# Patient Record
Sex: Female | Born: 1990
Health system: Southern US, Community
[De-identification: ages and names within clinical notes are randomized; demographics above are authoritative.]

## PROBLEM LIST (undated history)

## (undated) ENCOUNTER — Inpatient Hospital Stay (HOSPITAL_COMMUNITY): Payer: Self-pay

## (undated) DIAGNOSIS — Z5189 Encounter for other specified aftercare: Secondary | ICD-10-CM

## (undated) DIAGNOSIS — O139 Gestational [pregnancy-induced] hypertension without significant proteinuria, unspecified trimester: Secondary | ICD-10-CM

## (undated) DIAGNOSIS — M1990 Unspecified osteoarthritis, unspecified site: Secondary | ICD-10-CM

## (undated) DIAGNOSIS — F419 Anxiety disorder, unspecified: Secondary | ICD-10-CM

## (undated) DIAGNOSIS — B009 Herpesviral infection, unspecified: Secondary | ICD-10-CM

## (undated) DIAGNOSIS — Z765 Malingerer [conscious simulation]: Secondary | ICD-10-CM

## (undated) DIAGNOSIS — D649 Anemia, unspecified: Secondary | ICD-10-CM

## (undated) DIAGNOSIS — F1910 Other psychoactive substance abuse, uncomplicated: Secondary | ICD-10-CM

## (undated) DIAGNOSIS — O24419 Gestational diabetes mellitus in pregnancy, unspecified control: Secondary | ICD-10-CM

## (undated) DIAGNOSIS — F329 Major depressive disorder, single episode, unspecified: Secondary | ICD-10-CM

## (undated) DIAGNOSIS — K5090 Crohn's disease, unspecified, without complications: Secondary | ICD-10-CM

## (undated) DIAGNOSIS — F32A Depression, unspecified: Secondary | ICD-10-CM

## (undated) DIAGNOSIS — F1911 Other psychoactive substance abuse, in remission: Secondary | ICD-10-CM

## (undated) HISTORY — DX: Anxiety disorder, unspecified: F41.9

## (undated) HISTORY — DX: Depression, unspecified: F32.A

## (undated) HISTORY — DX: Crohn's disease, unspecified, without complications: K50.90

## (undated) HISTORY — DX: Major depressive disorder, single episode, unspecified: F32.9

## (undated) HISTORY — DX: Anemia, unspecified: D64.9

## (undated) HISTORY — DX: Unspecified osteoarthritis, unspecified site: M19.90

## (undated) HISTORY — PX: CHOLECYSTECTOMY: SHX55

## (undated) HISTORY — DX: Gestational (pregnancy-induced) hypertension without significant proteinuria, unspecified trimester: O13.9

## (undated) HISTORY — DX: Other psychoactive substance abuse, uncomplicated: F19.10

## (undated) HISTORY — DX: Encounter for other specified aftercare: Z51.89

---
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
## 2012-02-17 ENCOUNTER — Emergency Department (HOSPITAL_COMMUNITY)
Admission: EM | Admit: 2012-02-17 | Discharge: 2012-02-17 | Disposition: A | Discharge status: Home / Self Care | Payer: Medicaid Other | Attending: Emergency Medicine | Admitting: Emergency Medicine

## 2012-02-17 ENCOUNTER — Encounter (HOSPITAL_COMMUNITY): Payer: Self-pay | Admitting: Emergency Medicine

## 2012-02-17 DIAGNOSIS — T7840XA Allergy, unspecified, initial encounter: Secondary | ICD-10-CM | POA: Insufficient documentation

## 2012-02-17 DIAGNOSIS — T50995A Adverse effect of other drugs, medicaments and biological substances, initial encounter: Secondary | ICD-10-CM | POA: Insufficient documentation

## 2012-02-17 MED ORDER — PREDNISONE 20 MG PO TABS: 40.0000 mg | ORAL_TABLET | Freq: Once | ORAL | Status: AC

## 2012-02-17 MED ORDER — PREDNISONE 20 MG PO TABS
60.0000 mg | ORAL_TABLET | Freq: Once | ORAL | Status: AC
  Administered 2012-02-17: 60 mg via ORAL
  Filled 2012-02-17: qty 3

## 2012-02-17 NOTE — Discharge Instructions (Signed)

## 2012-02-17 NOTE — ED Notes (Signed)
Pt alert, nad, c/o rash to left arm, onset a week, per family believed to be a reaction to Vicodin, changed detergents recently, rash worse, resp even unlabored, skin pwd, pt has 5cm raised area to left antecubital area

## 2012-02-17 NOTE — ED Provider Notes (Signed)
History     CSN: [id number]  Arrival date & time 02/17/12  2204   First MD Initiated Contact with Patient 02/17/12 2325      Chief Complaint  Patient presents with  . Rash    (Consider location/radiation/quality/duration/timing/severity/associated sxs/prior treatment) HPI Comments: Patient here with rash to left antecubital fossa - she states that she was recently treated with flagyl for trichamoniasis and has also started using a new detergent.  She states the itching started the following day - denies any other area where the rash and itching are.  Patient is a 21 y.o. female presenting with rash. The history is provided by the patient. No language interpreter was used.  Rash  This is a new problem. The current episode started more than 2 days ago. The problem has not changed since onset.The problem is associated with new medications and a new detergent/soap. There has been no fever. Affected Location: left antecubital fossa. The pain is at a severity of 0/10. The patient is experiencing no pain. The pain has been constant since onset. Associated symptoms include itching. Pertinent negatives include no blisters, no pain and no weeping. She has tried nothing for the symptoms. The treatment provided no relief.    History reviewed. No pertinent past medical history.  Past Surgical History  Procedure Date  . Deep neck lymph node biopsy / excision 2011    Family History  Problem Relation Age of Onset  . Diabetes Mother   . Hypertension Mother     History  Substance Use Topics  . Smoking status: Never Smoker   . Smokeless tobacco: Not on file  . Alcohol Use: Yes     occasionally    OB History    Grav Para Term Preterm Abortions TAB SAB Ect Mult Living                  Review of Systems  Skin: Positive for itching and rash.  All other systems reviewed and are negative.    Allergies  Review of patient's allergies indicates no known allergies.  Home Medications    Current Outpatient Rx  Name Route Sig Dispense Refill  . IBUPROFEN-DIPHENHYDRAMINE HCL 200-25 MG PO CAPS Oral Take 1 capsule by mouth daily.      BP 121/67  Pulse 84  Temp 98 F (36.7 C)  Resp 16  Wt 160 lb (72.576 kg)  SpO2 99%  LMP 01/17/2012  Physical Exam  Nursing note and vitals reviewed. Constitutional: She is oriented to person, place, and time. She appears well-developed and well-nourished. No distress.  HENT:  Head: Normocephalic and atraumatic.  Right Ear: External ear normal.  Left Ear: External ear normal.  Nose: Nose normal.  Mouth/Throat: Oropharynx is clear and moist. No oropharyngeal exudate.  Eyes: Conjunctivae are normal. Pupils are equal, round, and reactive to light. No scleral icterus.  Neck: Normal range of motion. Neck supple.  Cardiovascular: Normal rate, regular rhythm and normal heart sounds.  Exam reveals no friction rub.   No murmur heard. Pulmonary/Chest: Effort normal and breath sounds normal. No respiratory distress. She has no wheezes. She has no rales. She exhibits no tenderness.  Abdominal: Soft. Bowel sounds are normal. She exhibits no distension. There is no tenderness.  Musculoskeletal: She exhibits no edema and no tenderness.  Lymphadenopathy:    She has no cervical adenopathy.  Neurological: She is alert and oriented to person, place, and time. No cranial nerve deficit.  Skin: Skin is warm and dry. Rash noted.  No erythema. No pallor.       Plaque raised red rash noted to the left antecubital fossa.  Psychiatric: She has a normal mood and affect. Her behavior is normal. Judgment and thought content normal.    ED Course  Procedures (including critical care time)  Labs Reviewed - No data to display No results found.   Allergic Reaction    MDM  Patient here with itchy rash to left AC fossa - no evidence of systemic involvement - placed on short course of oral prednisone.        Idalia Needle [place], [place] 02/17/12 2337

## 2012-02-18 NOTE — ED Provider Notes (Signed)
Medical screening examination/treatment/procedure(s) were performed by non-physician practitioner and as supervising physician I was immediately available for consultation/collaboration.   Wynetta Fines, MD 02/18/12 [phone]

## 2012-03-25 ENCOUNTER — Emergency Department (INDEPENDENT_AMBULATORY_CARE_PROVIDER_SITE_OTHER)
Admission: EM | Admit: 2012-03-25 | Discharge: 2012-03-25 | Disposition: A | Discharge status: Home / Self Care | Payer: Medicaid Other | Source: Home / Self Care | Attending: Emergency Medicine | Admitting: Emergency Medicine

## 2012-03-25 ENCOUNTER — Encounter (HOSPITAL_COMMUNITY): Payer: Self-pay

## 2012-03-25 DIAGNOSIS — B009 Herpesviral infection, unspecified: Secondary | ICD-10-CM

## 2012-03-25 LAB — POCT URINALYSIS DIP (DEVICE)
Bilirubin Urine: NEGATIVE
Glucose, UA: NEGATIVE mg/dL
Ketones, ur: NEGATIVE mg/dL
Nitrite: NEGATIVE
Protein, ur: NEGATIVE mg/dL
Specific Gravity, Urine: 1.025 (ref 1.005–1.030)
Urobilinogen, UA: 0.2 mg/dL (ref 0.0–1.0)
pH: 7.5 (ref 5.0–8.0)

## 2012-03-25 LAB — WET PREP, GENITAL
Clue Cells Wet Prep HPF POC: NONE SEEN
Trich, Wet Prep: NONE SEEN

## 2012-03-25 LAB — POCT PREGNANCY, URINE: Preg Test, Ur: NEGATIVE

## 2012-03-25 MED ORDER — ACYCLOVIR 400 MG PO TABS: 400.0000 mg | ORAL_TABLET | ORAL | Status: AC

## 2012-03-25 NOTE — Discharge Instructions (Signed)
Herpes Simplex Herpes simplex is generally classified as Type 1 or Type 2. Type 1 is generally the type that is responsible for cold sores. Type 2 is generally associated with sexually transmitted diseases. We now know that most of the thoughts on these viruses are inaccurate. We find that HSV1 is also present genitally and HSV2 can be present orally, but this will vary in different locations of the world. Herpes simplex is usually detected by doing a culture. Blood tests are also available for this virus; however, the accuracy is often not as good.  PREPARATION FOR TEST No preparation or fasting is necessary. NORMAL FINDINGS  No virus present   No HSV antigens or antibodies present  Ranges for normal findings may vary among different laboratories and hospitals. You should always check with your doctor after having lab work or other tests done to discuss the meaning of your test results and whether your values are considered within normal limits. MEANING OF TEST  Your caregiver will go over the test results with you and discuss the importance and meaning of your results, as well as treatment options and the need for additional tests if necessary. OBTAINING THE TEST RESULTS  It is your responsibility to obtain your test results. Ask the lab or department performing the test when and how you will get your results. Document Released: 10/14/2004 Document Revised: 08/31/2011 Document Reviewed: 08/22/2008 [facility] Patient Information 2012 [place].

## 2012-03-25 NOTE — ED Provider Notes (Signed)
Chief Complaint  Patient presents with  . Vaginal Itching    History of Present Illness:   Latoya [name] is an 21 year old female with a three-day history of vulvar itching and a small amount of whitish discharge. She denies any pelvic pain, although she has had a long-standing history of lower back pain. She denies any fevers, chills, nausea, or vomiting. She spotted about 2 weeks and then stop this past Wednesday. She is on Depo-Provera. Her next shot is due next week. She is sexually active without use of condoms. She attributes this current irritation to the fact that her boyfriend use cocoa butter as a lubricant. She denies any history of herpes or other STDs.  Review of Systems:  Other than noted above, the patient denies any of the following symptoms: Systemic:  No fever, chills, sweats, fatigue, or weight loss. GI:  No abdominal pain, nausea, anorexia, vomiting, diarrhea, constipation, melena or hematochezia. GU:  No dysuria, frequency, urgency, hematuria, vaginal discharge, itching, or abnormal vaginal bleeding. Skin:  No rash or itching.   [place]:  Past medical history, family history, social history, meds, and allergies were reviewed.  Physical Exam:   Vital signs:  BP 112/81  Pulse 79  Temp 98.4 F (36.9 C) (Oral)  Resp 16  SpO2 98% General:  Alert, oriented and in no distress. Lungs:  Breath sounds clear and equal bilaterally.  No wheezes, rales or rhonchi. Heart:  Regular rhythm.  No gallops or murmers. Abdomen:  Soft, flat and non-distended.  No organomegaly or mass.  No tenderness, guarding or rebound.  Bowel sounds normally active. Pelvic exam:  She has several small, shallow ulcerations around the vaginal introitus. No other vulvar lesions. Vaginal mucosa was normal. She had some thick, yellow discharge coming from the cervical os. Cervix was otherwise normal. She had mild cervical motion tenderness. Uterus was midposition normal in size and shape and mildly tender. She has  mild bilateral adnexal tenderness without mass. Skin:  Clear, warm and dry.  Labs:   Results for orders placed during the hospital encounter of 03/25/12  POCT URINALYSIS DIP (DEVICE)      Component Value Range   Glucose, UA NEGATIVE  NEGATIVE mg/dL   Bilirubin Urine NEGATIVE  NEGATIVE   Ketones, ur NEGATIVE  NEGATIVE mg/dL   Specific Gravity, Urine 1.025  1.005 - 1.030   Hgb urine dipstick TRACE (*) NEGATIVE   pH 7.5  5.0 - 8.0   Protein, ur NEGATIVE  NEGATIVE mg/dL   Urobilinogen, UA 0.2  0.0 - 1.0 mg/dL   Nitrite NEGATIVE  NEGATIVE   Leukocytes, UA SMALL (*) NEGATIVE  POCT PREGNANCY, URINE      Component Value Range   Preg Test, Ur NEGATIVE  NEGATIVE    Other Labs Obtained at [facility]:  Herpes simplex viral culture was obtained as well as serologies for HIV and syphilis and GC and Chlamydia DNA probe as well as wet prep.  Results are pending at this time and we will call about any positive results.  Assessment:  The encounter diagnosis was Herpes simplex.  Plan:   1.  The following meds were prescribed:   New Prescriptions   ACYCLOVIR (ZOVIRAX) 400 MG TABLET    Take 1 tablet (400 mg total) by mouth every 4 (four) hours while awake.   2.  The patient was instructed in symptomatic care and handouts were given. 3.  The patient was told to return if becoming worse in any way, if no better in  3 or 4 days, and given some red flag symptoms that would indicate earlier return. The patient was informed about herpes simplex. In particular, and she was told that this is contagious and can be passed by and protected sexual intercourse. She will need to inform her boyfriend in any future sexual partners. She can prevent recurrent outbreaks by taking the acyclovir twice daily on the continuous basis I did give her enough medication to do this. I suggested she followup with a primary care physician or gynecologist.    Harden Mo, MD 03/25/12 [phone]

## 2012-03-25 NOTE — ED Notes (Signed)
Reports vaginal itching for past 3 days

## 2012-03-26 LAB — GC/CHLAMYDIA PROBE AMP, GENITAL
Chlamydia, DNA Probe: POSITIVE — AB
GC Probe Amp, Genital: NEGATIVE

## 2012-03-26 LAB — HIV ANTIBODY (ROUTINE TESTING W REFLEX): HIV: NONREACTIVE

## 2012-03-26 LAB — RPR: RPR Ser Ql: NONREACTIVE

## 2012-03-27 LAB — HERPES SIMPLEX VIRUS CULTURE: Culture: NOT DETECTED

## 2012-04-02 ENCOUNTER — Telehealth (HOSPITAL_COMMUNITY): Payer: Self-pay | Admitting: *Deleted

## 2012-04-02 MED ORDER — AZITHROMYCIN 500 MG PO TABS: ORAL_TABLET | ORAL | Status: DC

## 2012-04-02 NOTE — ED Notes (Addendum)
GC neg., Chlamydia pos. Wet prep: few yeast, WBC's TNTC, HIV/RPR non-reactive, Herpes culture: No Herpes Simplex Virus detected.  Labs shown to Dr. Juventino Slovak and he e-prescribe Azithromycin 500 mg. X 2 take together to the [facility] on [address].  I called pt. Pt. verified x 2 and given results. Pt. told she needs Zithromax for the Chlamydia and to take it with food.  Instructed to call back if she vomits the medication. Pt. told Rx. sent to [facility] on [address].  Pt. instructed to notify her partner, no sex for 1 week and to practice safe sex. Pt. told she should get HIV testing done again in 6 mos.  Pt. asked about the Acyclovir.  Discussed with Dr. Juventino Slovak  He said to tell pt. she can stop, since culture was neg.  It could be a false neg. It is mainly diagnosed if it reoccurs.  Get rechecked if she has a recurrence with intact blisters. Pt. voiced understanding.  DHHS form completed and faxed to the [facility]. Roselyn Meier 04/02/2012

## 2013-04-09 ENCOUNTER — Encounter: Payer: Self-pay | Admitting: Obstetrics & Gynecology

## 2013-04-09 ENCOUNTER — Ambulatory Visit (INDEPENDENT_AMBULATORY_CARE_PROVIDER_SITE_OTHER): Payer: BC Managed Care – PPO | Admitting: Obstetrics & Gynecology

## 2013-04-09 VITALS — BP 123/67 | HR 93 | Temp 98.2°F | Ht 69.0 in | Wt 180.0 lb

## 2013-04-09 DIAGNOSIS — Z309 Encounter for contraceptive management, unspecified: Secondary | ICD-10-CM

## 2013-04-09 DIAGNOSIS — N760 Acute vaginitis: Secondary | ICD-10-CM

## 2013-04-09 DIAGNOSIS — IMO0001 Reserved for inherently not codable concepts without codable children: Secondary | ICD-10-CM

## 2013-04-09 LAB — POCT URINE PREGNANCY: Preg Test, Ur: NEGATIVE

## 2013-04-09 MED ORDER — NORETHIN ACE-ETH ESTRAD-FE 1-20 MG-MCG(24) PO TABS
1.0000 | ORAL_TABLET | Freq: Every day | ORAL | Status: DC
Start: 2013-04-09 — End: 2013-05-11

## 2013-04-09 MED ORDER — METRONIDAZOLE 500 MG PO TABS: 500.0000 mg | ORAL_TABLET | Freq: Two times a day (BID) | ORAL | Status: DC

## 2013-04-09 NOTE — Progress Notes (Signed)
.   Subjective:     Latoya [name] is a 22 y.o. female here for a routine exam.  Current complaints:vaginal itching for about 3 days.  Personal health questionnaire reviewed: no.   Gynecologic History Patient's last menstrual period was 03/26/2013. Contraception: none Last Pap: N/A Last mammogram: N/A  Obstetric History OB History   Grav Para Term Preterm Abortions TAB SAB Ect Mult Living                   The following portions of the patient's history were reviewed and updated as appropriate: allergies, current medications, past family history, past medical history, past social history, past surgical history and problem list.  Review of Systems Pertinent items are noted in HPI.    Objective:    General appearance: alert Breasts: normal appearance, no masses or tenderness Abdomen: soft, non-tender; bowel sounds normal; no masses,  no organomegaly Pelvic: cervix normal in appearance, external genitalia normal, no adnexal masses or tenderness, uterus normal size, shape, and consistency and vagina with white discharge    Assessment:   Bacterial vaginosis  Plan:    Metronidazole STI testing [facility]  Return prn

## 2013-04-09 NOTE — Patient Instructions (Addendum)
Etonogestrel implant What is this medicine? ETONOGESTREL is a contraceptive (birth control) device. It is used to prevent pregnancy. It can be used for up to 3 years. This medicine may be used for other purposes; ask your health care provider or pharmacist if you have questions. What should I tell my health care provider before I take this medicine? They need to know if you have any of these conditions: -abnormal vaginal bleeding -blood vessel disease or blood clots -cancer of the breast, cervix, or liver -depression -diabetes -gallbladder disease -headaches -heart disease or recent heart attack -high blood pressure -high cholesterol -kidney disease -liver disease -renal disease -seizures -tobacco smoker -an unusual or allergic reaction to etonogestrel, other hormones, anesthetics or antiseptics, medicines, foods, dyes, or preservatives -pregnant or trying to get pregnant -breast-feeding How should I use this medicine? This device is inserted just under the skin on the inner side of your upper arm by a health care professional. Talk to your pediatrician regarding the use of this medicine in children. Special care may be needed. Overdosage: If you think you've taken too much of this medicine contact a poison control center or emergency room at once. Overdosage: If you think you have taken too much of this medicine contact a poison control center or emergency room at once. NOTE: This medicine is only for you. Do not share this medicine with others. What if I miss a dose? This does not apply. What may interact with this medicine? Do not take this medicine with any of the following medications: -amprenavir -bosentan -fosamprenavir This medicine may also interact with the following medications: -barbiturate medicines for inducing sleep or treating seizures -certain medicines for fungal infections like ketoconazole and itraconazole -griseofulvin -medicines to treat seizures like  carbamazepine, felbamate, oxcarbazepine, phenytoin, topiramate -modafinil -phenylbutazone -rifampin -some medicines to treat HIV infection like atazanavir, indinavir, lopinavir, nelfinavir, tipranavir, ritonavir -St. John's wort This list may not describe all possible interactions. Give your health care provider a list of all the medicines, herbs, non-prescription drugs, or dietary supplements you use. Also tell them if you smoke, drink alcohol, or use illegal drugs. Some items may interact with your medicine. What should I watch for while using this medicine? This product does not protect you against HIV infection (AIDS) or other sexually transmitted diseases. You should be able to feel the implant by pressing your fingertips over the skin where it was inserted. Tell your doctor if you cannot feel the implant. What side effects may I notice from receiving this medicine? Side effects that you should report to your doctor or health care professional as soon as possible: -allergic reactions like skin rash, itching or hives, swelling of the face, lips, or tongue -breast lumps -changes in vision -confusion, trouble speaking or understanding -dark urine -depressed mood -general ill feeling or flu-like symptoms -light-colored stools -loss of appetite, nausea -right upper belly pain -severe headaches -severe pain, swelling, or tenderness in the abdomen -shortness of breath, chest pain, swelling in a leg -signs of pregnancy -sudden numbness or weakness of the face, arm or leg -trouble walking, dizziness, loss of balance or coordination -unusual vaginal bleeding, discharge -unusually weak or tired -yellowing of the eyes or skin Side effects that usually do not require medical attention (Report these to your doctor or health care professional if they continue or are bothersome.): -acne -breast pain -changes in weight -cough -fever or chills -headache -irregular menstrual  bleeding -itching, burning, and vaginal discharge -pain or difficulty passing urine -sore throat This  list may not describe all possible side effects. Call your doctor for medical advice about side effects. You may report side effects to FDA at 1-800-FDA-1088. Where should I keep my medicine? This drug is given in a hospital or clinic and will not be stored at home. NOTE: This sheet is a summary. It may not cover all possible information. If you have questions about this medicine, talk to your doctor, pharmacist, or health care provider.  2013, [employer]vier/Gold Standard. (06/04/2009 3:54:17 PM) Intrauterine Device Information An intrauterine device (IUD) is inserted into your uterus and prevents pregnancy. There are 2 types of IUDs available:  Copper IUD. This type of IUD is wrapped in copper wire and is placed inside the uterus. Copper makes the uterus and fallopian tubes produce a fluid that kills sperm. The copper IUD can stay in place for 10 years.  Hormone IUD. This type of IUD contains the hormone progestin (synthetic progesterone). The hormone thickens the cervical mucus and prevents sperm from entering the uterus, and it also thins the uterine lining to prevent implantation of a fertilized egg. The hormone can weaken or kill the sperm that get into the uterus. The hormone IUD can stay in place for 5 years. Your caregiver will make sure you are a good candidate for a contraceptive IUD. Discuss with your caregiver the possible side effects. ADVANTAGES  It is highly effective, reversible, long-acting, and low maintenance.  There are no estrogen-related side effects.  An IUD can be used when breastfeeding.  It is not associated with weight gain.  It works immediately after insertion.  The copper IUD does not interfere with your female hormones.  The progesterone IUD can make heavy menstrual periods lighter.  The progesterone IUD can be used for 5 years.  The copper IUD can be used  for 10 years. DISADVANTAGES  The progesterone IUD can be associated with irregular bleeding patterns.  The copper IUD can make your menstrual flow heavier and more painful.  You may experience cramping and vaginal bleeding after insertion. Document Released: 08/15/2004 Document Revised: 12/04/2011 Document Reviewed: 01/14/2011 [facility] Patient Information 2014 [place]. Vaginitis Vaginitis is an inflammation of the vagina. It is most often caused by a change in the normal balance of the bacteria and yeast that live in the vagina. This change in balance causes an overgrowth of certain bacteria or yeast, which causes the inflammation. There are different types of vaginitis, but the most common types are:  Bacterial vaginosis.  Yeast infection (candidiasis).  Trichomoniasis vaginitis. This is a sexually transmitted infection (STI).  Viral vaginitis.  Atropic vaginitis.  Allergic vaginitis. CAUSES  The cause depends on the type of vaginitis. Vaginitis can be caused by:  Bacteria (bacterial vaginosis).  Yeast (yeast infection).  A parasite (trichomoniasis vaginitis)  A virus (viral vaginitis).  Low hormone levels (atrophic vaginitis). Low hormone levels can occur during pregnancy, breastfeeding, or after menopause.  Irritants, such as bubble baths, scented tampons, and feminine sprays (allergic vaginitis). Other factors can change the normal balance of the yeast and bacteria that live in the vagina. These include:  Antibiotic medicines.  Poor hygiene.  Diaphragms, vaginal sponges, spermicides, birth control pills, and intrauterine devices (IUD).  Sexual intercourse.  Infection.  Uncontrolled diabetes.  A weakened immune system. SYMPTOMS  Symptoms can vary depending on the cause of the vaginitis. Common symptoms include:  Abnormal vaginal discharge.  The discharge is white, gray, or yellow with bacterial vaginosis.  The discharge is thick, white, and  cheesy  with a yeast infection.  The discharge is frothy and yellow or greenish with trichomoniasis.  A bad vaginal odor.  The odor is fishy with bacterial vaginosis.  Vaginal itching, pain, or swelling.  Painful intercourse.  Pain or burning when urinating. Sometimes, there are no symptoms. TREATMENT  Treatment will vary depending on the type of infection.   Bacterial vaginosis and trichomoniasis are often treated with antibiotic creams or pills.  Yeast infections are often treated with antifungal medicines, such as vaginal creams or suppositories.  Viral vaginitis has no cure, but symptoms can be treated with medicines that relieve discomfort. Your sexual partner should be treated as well.  Atrophic vaginitis may be treated with an estrogen cream, pill, suppository, or vaginal ring. If vaginal dryness occurs, lubricants and moisturizing creams may help. You may be told to avoid scented soaps, sprays, or douches.  Allergic vaginitis treatment involves quitting the use of the product that is causing the problem. Vaginal creams can be used to treat the symptoms. HOME CARE INSTRUCTIONS   Take all medicines as directed by your caregiver.  Keep your genital area clean and dry. Avoid soap and only rinse the area with water.  Avoid douching. It can remove the healthy bacteria in the vagina.  Do not use tampons or have sexual intercourse until your vaginitis has been treated. Use sanitary pads while you have vaginitis.  Wipe from front to back. This avoids the spread of bacteria from the rectum to the vagina.  Let air reach your genital area.  Wear cotton underwear to decrease moisture buildup.  Avoid wearing underwear while you sleep until your vaginitis is gone.  Avoid tight pants and underwear or nylons without a cotton panel.  Take off wet clothing (especially bathing suits) as soon as possible.  Use mild, non-scented products. Avoid using irritants, such as:  Scented  feminine sprays.  Fabric softeners.  Scented detergents.  Scented tampons.  Scented soaps or bubble baths.  Practice safe sex and use condoms. Condoms may prevent the spread of trichomoniasis and viral vaginitis. SEEK MEDICAL CARE IF:   You have abdominal pain.  You have a fever or persistent symptoms for more than 2 3 days.  You have a fever and your symptoms suddenly get worse. Document Released: 07/09/2007 Document Revised: 06/05/2012 Document Reviewed: 02/22/2012 [facility] Patient Information 2014 [place].

## 2013-04-10 LAB — HIV ANTIBODY (ROUTINE TESTING W REFLEX): HIV: NONREACTIVE

## 2013-04-10 LAB — GC/CHLAMYDIA PROBE AMP
CT Probe RNA: NEGATIVE
GC Probe RNA: NEGATIVE

## 2013-04-10 LAB — RPR

## 2013-04-11 LAB — POCT WET PREP (WET MOUNT): Clue Cells Wet Prep Whiff POC: POSITIVE

## 2013-05-11 ENCOUNTER — Encounter (HOSPITAL_COMMUNITY): Payer: Self-pay | Admitting: *Deleted

## 2013-05-11 ENCOUNTER — Emergency Department (HOSPITAL_COMMUNITY)
Admission: EM | Admit: 2013-05-11 | Discharge: 2013-05-11 | Disposition: A | Discharge status: Home / Self Care | Payer: BC Managed Care – PPO | Attending: Emergency Medicine | Admitting: Emergency Medicine

## 2013-05-11 DIAGNOSIS — K120 Recurrent oral aphthae: Secondary | ICD-10-CM | POA: Insufficient documentation

## 2013-05-11 NOTE — ED Notes (Signed)
Pt not in room.  Pt d/c home but did not receive instructions or paperwork.

## 2013-05-11 NOTE — ED Provider Notes (Signed)
Medical screening examination/treatment/procedure(s) were performed by non-physician practitioner and as supervising physician I was immediately available for consultation/collaboration.  Kalman Drape, MD 05/11/13 [phone]

## 2013-05-11 NOTE — ED Notes (Signed)
Pt has a sore underneath tongue on her left side,  States she thought it was a "lie" bump but it is painful and not going away,  Says almost feels like when she had her tongue ring and it got infected

## 2013-05-11 NOTE — ED Provider Notes (Signed)
  CSN: [id number]     Arrival date & time 05/11/13  0419 History     First MD Initiated Contact with Patient 05/11/13 0440     Chief Complaint  Patient presents with  . Mouth Lesions   HPI  History provided by the patient. The patient is a 22 year old female with no significant PMH who presents with complaints of pain and sore to her left tongue. Patient first noticed this 2 days ago. She has been using Orajel which has helped with pain. Pain is worse with movements and sometimes with eating. She does report past history of canker sores on the lips and cheeks as well as cold sores to her lip the symptoms today are slightly different because it is located in the tongue. She denies any pain or sores in the back of the throat. No difficulty swallowing or breathing. She denies any recent fever, chills or sweats. No other aggravating or alleviating factors. No other associated symptoms.    History reviewed. No pertinent past medical history. Past Surgical History  Procedure Laterality Date  . Deep neck lymph node biopsy / excision  2011   Family History  Problem Relation Age of Onset  . Diabetes Mother   . Hypertension Mother    History  Substance Use Topics  . Smoking status: Never Smoker   . Smokeless tobacco: Not on file  . Alcohol Use: Yes     Comment: occasionally   OB History   Grav Para Term Preterm Abortions TAB SAB Ect Mult Living                 Review of Systems  Constitutional: Negative for fever and chills.  HENT: Positive for mouth sores. Negative for dental problem.   All other systems reviewed and are negative.    Allergies  Review of patient's allergies indicates no known allergies.  Home Medications  No current outpatient prescriptions on file. BP 118/71  Pulse 82  Temp(Src) 98.3 F (36.8 C) (Oral)  Resp 14  Ht 5' 9"  (1.753 m)  Wt 180 lb (81.647 kg)  BMI 26.57 kg/m2  SpO2 98%  LMP 05/11/2013 Physical Exam  Nursing note and vitals  reviewed. Constitutional: She is oriented to person, place, and time. She appears well-developed and well-nourished. No distress.  HENT:  Head: Normocephalic.  Mouth/Throat:    Irritation and slight ulceration of the tissue to the underside of the left tongue. Area is slightly tender to palpation. No swelling or induration. No bleeding or drainage. dentition and gums otherwise unremarkable.  Neck: Normal range of motion. Neck supple.  Cardiovascular: Normal rate and regular rhythm.   Pulmonary/Chest: Effort normal and breath sounds normal. No respiratory distress. She has no wheezes. She has no rales.  Musculoskeletal: Normal range of motion.  Lymphadenopathy:    She has no cervical adenopathy.  Neurological: She is alert and oriented to person, place, and time.  Skin: Skin is warm and dry. No rash noted.  Psychiatric: She has a normal mood and affect. Her behavior is normal.    ED Course   Procedures    1. Canker sore     MDM  Patient seen and evaluated. Patient appears well no acute distress. Findings consistent with canker sore to the underside of the left tongue.  Martie Lee, PA-C 05/11/13 [phone]

## 2015-08-19 LAB — OB RESULTS CONSOLE HGB/HCT, BLOOD: Hemoglobin: 12.6 g/dL

## 2015-08-19 LAB — OB RESULTS CONSOLE ABO/RH: RH Type: POSITIVE

## 2015-08-19 LAB — OB RESULTS CONSOLE RUBELLA ANTIBODY, IGM: Rubella: IMMUNE

## 2015-08-19 LAB — OB RESULTS CONSOLE PLATELET COUNT: Platelets: 327 10*3/uL

## 2015-08-19 LAB — OB RESULTS CONSOLE ANTIBODY SCREEN: Antibody Screen: NEGATIVE

## 2015-08-19 LAB — OB RESULTS CONSOLE HIV ANTIBODY (ROUTINE TESTING): HIV: NONREACTIVE

## 2015-08-19 LAB — OB RESULTS CONSOLE HEPATITIS B SURFACE ANTIGEN: Hepatitis B Surface Ag: NEGATIVE

## 2015-08-19 LAB — OB RESULTS CONSOLE RPR: RPR: NONREACTIVE

## 2015-09-26 NOTE — L&D Delivery Note (Signed)
Delivery Note Pushed for over 2 hours. Good descent with second hour.  At 4:39 PM a viable and healthy female was delivered via Vaginal, Spontaneous Delivery (Presentation: Left Occiput Anterior). No difficulty with shoulders. APGAR: 9, 9; weight 4150g.   Placenta status: Intact, Spontaneous.  Cord: 3 vessels with the following complications: None.    Anesthesia: Epidural  Episiotomy: None Lacerations: 2nd degree;Perineal Suture Repair: 3.0 vicryl Est. Blood Loss (mL):  223m  Mom to postpartum.  Baby to Couplet care / Skin to Skin.  KRalene Ok MD 03/27/2016, 5:17 PM    I was gloved and present for entire delivery as was Dr PIlda Basset who directly supervised delivery. SVD without incident No difficulty with shoulders  Lacerations as listed above, repaired in the usual fashion. Repair of same supervised by me MSeabron Spates CNM   Attestation of Attending Supervision of Certified Nurse Midwife: Evaluation and management procedures were performed by the CNM under my supervision.  I have seen and examined the patient,  reviewed the CNM's note and chart, and I agree with the management and plan.   CDurene RomansMD Attending Center for [employer](Fish farm manager

## 2015-10-05 DIAGNOSIS — B351 Tinea unguium: Secondary | ICD-10-CM | POA: Diagnosis not present

## 2015-11-11 DIAGNOSIS — Z36 Encounter for antenatal screening of mother: Secondary | ICD-10-CM | POA: Diagnosis not present

## 2015-11-18 ENCOUNTER — Encounter (HOSPITAL_COMMUNITY): Payer: Self-pay | Admitting: *Deleted

## 2015-11-18 ENCOUNTER — Inpatient Hospital Stay (HOSPITAL_COMMUNITY)
Admission: AD | Admit: 2015-11-18 | Discharge: 2015-11-18 | Disposition: A | Discharge status: Home / Self Care | Payer: 59 | Source: Ambulatory Visit | Attending: Obstetrics & Gynecology | Admitting: Obstetrics & Gynecology

## 2015-11-18 DIAGNOSIS — O9989 Other specified diseases and conditions complicating pregnancy, childbirth and the puerperium: Secondary | ICD-10-CM | POA: Diagnosis not present

## 2015-11-18 DIAGNOSIS — R109 Unspecified abdominal pain: Secondary | ICD-10-CM | POA: Diagnosis not present

## 2015-11-18 DIAGNOSIS — Z3A19 19 weeks gestation of pregnancy: Secondary | ICD-10-CM | POA: Insufficient documentation

## 2015-11-18 DIAGNOSIS — O26892 Other specified pregnancy related conditions, second trimester: Secondary | ICD-10-CM | POA: Insufficient documentation

## 2015-11-18 DIAGNOSIS — N949 Unspecified condition associated with female genital organs and menstrual cycle: Secondary | ICD-10-CM

## 2015-11-18 LAB — URINALYSIS, ROUTINE W REFLEX MICROSCOPIC
Bilirubin Urine: NEGATIVE
Glucose, UA: NEGATIVE mg/dL
Hgb urine dipstick: NEGATIVE
Ketones, ur: NEGATIVE mg/dL
Nitrite: NEGATIVE
Protein, ur: NEGATIVE mg/dL
Specific Gravity, Urine: 1.02 (ref 1.005–1.030)
pH: 5.5 (ref 5.0–8.0)

## 2015-11-18 LAB — URINE MICROSCOPIC-ADD ON: RBC / HPF: NONE SEEN RBC/hpf (ref 0–5)

## 2015-11-18 NOTE — MAU Provider Note (Signed)
History     CSN: [id number]  Arrival date and time: 11/18/15 9417   First Provider Initiated Contact with Patient 11/18/15 1002      Chief Complaint  Patient presents with  . Abdominal Cramping   HPI Latoya [name] is a 24 y.o. G1P0 at 19w5dwho presents to MAU today with complaint of lower abdominal cramping since earlier this morning. The patient states that she worked overnight last night and did not drink much water. She felt cramping in the lower abdomen this morning when she left work. She went home and had a normal BM. She states continued cramping. Pain improved somewhat after a shower. She denies vaginal bleeding, discharge, UTI symptoms, fever, N/V/D or constipation. She states that pain has now resolved. She did not take anything for pain. [place]   OB History    Gravida Para Term Preterm AB TAB SAB Ectopic Multiple Living   1               Past Medical History  Diagnosis Date  . Medical history non-contributory     Past Surgical History  Procedure Laterality Date  . Deep neck lymph node biopsy / excision  2011    Family History  Problem Relation Age of Onset  . Diabetes Mother   . Hypertension Mother     Social History  Substance Use Topics  . Smoking status: Never Smoker   . Smokeless tobacco: None  . Alcohol Use: No     Comment: occasionally    Allergies: No Known Allergies  No prescriptions prior to admission    Review of Systems  Constitutional: Negative for fever and malaise/fatigue.  Gastrointestinal: Positive for abdominal pain. Negative for nausea, vomiting, diarrhea and constipation.  Genitourinary: Negative for dysuria, urgency and frequency.       Neg -vaginal bleeding, discharge   Physical Exam   Blood pressure 114/70, pulse 97, temperature 98.1 F (36.7 C), temperature source Oral, resp. rate 18, weight 221 lb 6.4 oz (100.426 kg).  Physical Exam  Nursing note and vitals  reviewed. Constitutional: She is oriented to person, place, and time. She appears well-developed and well-nourished. No distress.  HENT:  Head: Normocephalic and atraumatic.  Cardiovascular: Normal rate.   Respiratory: Effort normal.  GI: Soft. She exhibits no distension and no mass. There is no tenderness. There is no rebound and no guarding.  Neurological: She is alert and oriented to person, place, and time.  Skin: Skin is warm and dry. No erythema.  Psychiatric: She has a normal mood and affect.  Dilation: Closed Effacement (%): Thick Cervical Position: Posterior Exam by:: JKerry Hough PA   Results for orders placed or performed during the hospital encounter of 11/18/15 (from the past 24 hour(s))  [facility]inal[facility], [facility]     Status: Abnormal   Collection Time: 11/18/15  9:55 AM  Result Value Ref Range   Color, Urine YELLOW YELLOW   APPearance CLEAR CLEAR   Specific Gravity, Urine 1.020 1.005 - 1.030   pH 5.5 5.0 - 8.0   Glucose, UA NEGATIVE NEGATIVE mg/dL   Hgb urine dipstick NEGATIVE NEGATIVE   Bilirubin Urine NEGATIVE NEGATIVE   Ketones, ur NEGATIVE NEGATIVE mg/dL   Protein, ur NEGATIVE NEGATIVE mg/dL   Nitrite NEGATIVE NEGATIVE   Leukocytes, UA SMALL (A) NEGATIVE  Urine microscopic-add on     Status: Abnormal   Collection Time: 11/18/15  9:55 AM  Result Value Ref  Range   Squamous Epithelial / LPF 0-5 (A) NONE SEEN   WBC, UA 0-5 0 - 5 WBC/hpf   RBC / HPF NONE SEEN 0 - 5 RBC/hpf   Bacteria, UA RARE (A) NONE SEEN    MAU Course  Procedures None  MDM FHR - 147 bpm with doppler UA today  Assessment and Plan  A: SIUP at 66w5dRound ligament pain  P: Discharge home Tylenol PRN for pain advised without exceeding 3000 mg daily Second trimester precautions discussed Discussed use of abdominal binder and warm bath/shower for pain Patient advised to follow-up with OB provider of choice Patient may return to MAU as needed or if her  condition were to change or worsen   JLuvenia Redden PA-C  11/18/2015, 11:09 AM

## 2015-11-18 NOTE — MAU Note (Signed)
Did not drink much last night.  Pain was worse earlier

## 2015-11-18 NOTE — Discharge Instructions (Signed)
Round Ligament Pain During Pregnancy   Round ligament pain is a sharp pain or jabbing feeling often felt in the lower belly or groin area on one or both sides. It is one of the most common complaints during pregnancy and is considered a normal part of pregnancy. It is most often felt during the second trimester.   Here is what you need to know about round ligament pain, including some tips to help you feel better.   Causes of Round Ligament Pain:    Several thick ligaments surround and support your womb (uterus) as it grows during pregnancy. One of them is called the round ligament.   The round ligament connects the front part of the womb to your groin, the area where your legs attach to your pelvis. The round ligament normally tightens and relaxes slowly.   As your baby and womb grow, the round ligament stretches. That makes it more likely to become strained.   Sudden movements can cause the ligament to tighten quickly, like a rubber band snapping. This causes a sudden and quick jabbing feeling.   Symptoms of Round Ligament Pain   Round ligament pain can be concerning and uncomfortable. But it is considered normal as your body changes during pregnancy.   The symptoms of round ligament pain include a sharp, sudden spasm in the belly. It usually affects the right side, but it may happen on both sides. The pain only lasts a few seconds.   Exercise may cause the pain, as will rapid movements such as:   sneezing  coughing  laughing  rolling over in bed  standing up too quickly   Treatment of Round Ligament Pain   Here are some tips that may help reduce your discomfort:   Pain relief. Take over-the-counter acetaminophen for pain, if necessary. Ask your doctor if this is OK.   Exercise. Get plenty of exercise to keep your stomach (core) muscles strong. Doing stretching exercises or prenatal yoga can be helpful. Ask your doctor which exercises are safe for you and your baby.   A  helpful exercise involves putting your hands and knees on the floor, lowering your head, and pushing your backside into the air.   Avoid sudden movements. Change positions slowly (such as standing up or sitting down) to avoid sudden movements that may cause stretching and pain.   Flex your hips. Bend and flex your hips before you cough, sneeze, or laugh to avoid pulling on the ligaments.   Apply warmth. A heating pad or warm bath may be helpful. Ask your doctor if this is OK. Extreme heat can be dangerous to the baby.   You should try to modify your daily activity level and avoid positions that may worsen the condition.   When to Call the Doctor/Midwife   Always tell your doctor or midwife about any type of pain you have during pregnancy. Round ligament pain is quick and doesn't last long.   Call your health care provider immediately if you have:   severe pain  fever  chills  pain on urination  difficulty walking   Belly pain during pregnancy can be due to many different causes. It is important for your doctor to rule out more serious conditions, including pregnancy complications such as placenta abruption or non-pregnancy illnesses such as:   inguinal hernia  appendicitis  stomach, liver, and kidney problems  Preterm labor pains may sometimes be mistaken for round ligament pain.

## 2015-11-18 NOTE — MAU Note (Signed)
Started cramping when got off work this morning.  Denies diarrhea, no Urinary symptoms

## 2016-01-05 DIAGNOSIS — Z36 Encounter for antenatal screening of mother: Secondary | ICD-10-CM | POA: Diagnosis not present

## 2016-01-05 DIAGNOSIS — O368120 Decreased fetal movements, second trimester, not applicable or unspecified: Secondary | ICD-10-CM | POA: Diagnosis not present

## 2016-01-20 DIAGNOSIS — O24414 Gestational diabetes mellitus in pregnancy, insulin controlled: Secondary | ICD-10-CM | POA: Diagnosis not present

## 2016-02-04 DIAGNOSIS — Z3493 Encounter for supervision of normal pregnancy, unspecified, third trimester: Secondary | ICD-10-CM | POA: Diagnosis not present

## 2016-02-08 DIAGNOSIS — D649 Anemia, unspecified: Secondary | ICD-10-CM | POA: Insufficient documentation

## 2016-02-09 DIAGNOSIS — Z3A31 31 weeks gestation of pregnancy: Secondary | ICD-10-CM | POA: Diagnosis not present

## 2016-02-09 DIAGNOSIS — Z3493 Encounter for supervision of normal pregnancy, unspecified, third trimester: Secondary | ICD-10-CM | POA: Diagnosis not present

## 2016-02-24 DIAGNOSIS — O24419 Gestational diabetes mellitus in pregnancy, unspecified control: Secondary | ICD-10-CM | POA: Diagnosis not present

## 2016-02-24 DIAGNOSIS — Z3A33 33 weeks gestation of pregnancy: Secondary | ICD-10-CM | POA: Diagnosis not present

## 2016-02-25 ENCOUNTER — Telehealth: Payer: Self-pay | Admitting: *Deleted

## 2016-02-25 ENCOUNTER — Encounter: Payer: Self-pay | Admitting: *Deleted

## 2016-02-25 DIAGNOSIS — O0990 Supervision of high risk pregnancy, unspecified, unspecified trimester: Secondary | ICD-10-CM | POA: Insufficient documentation

## 2016-02-25 DIAGNOSIS — O99210 Obesity complicating pregnancy, unspecified trimester: Secondary | ICD-10-CM | POA: Insufficient documentation

## 2016-02-25 DIAGNOSIS — E669 Obesity, unspecified: Secondary | ICD-10-CM

## 2016-02-25 DIAGNOSIS — O24419 Gestational diabetes mellitus in pregnancy, unspecified control: Secondary | ICD-10-CM

## 2016-02-25 DIAGNOSIS — S36115A Moderate laceration of liver, initial encounter: Secondary | ICD-10-CM

## 2016-02-25 NOTE — Telephone Encounter (Signed)
Latoya [name] left a message yesterday at 12:24pm stating she is being transferred from [place] to our clinic and first appointment is 03/06/16. She states she needs her FMLA papers signed by 03/05/16. She states not sure if a doctor can sign them.  I called her back and left a message I am returning her call-may call us Monday as we are closed . May need to call previous provider to get paperwork done.

## 2016-02-28 NOTE — Telephone Encounter (Signed)
I called Latoya [name] again and left a message I am returning her call again- she may call us back if she still needs assistance. I encouraged her to call her previous provider regarding her request.

## 2016-03-06 ENCOUNTER — Ambulatory Visit: Payer: 59

## 2016-03-09 ENCOUNTER — Ambulatory Visit (INDEPENDENT_AMBULATORY_CARE_PROVIDER_SITE_OTHER): Payer: 59 | Admitting: Family Medicine

## 2016-03-09 ENCOUNTER — Encounter: Payer: Self-pay | Admitting: Family Medicine

## 2016-03-09 VITALS — BP 119/59 | HR 114 | Wt 244.6 lb

## 2016-03-09 DIAGNOSIS — O24410 Gestational diabetes mellitus in pregnancy, diet controlled: Secondary | ICD-10-CM

## 2016-03-09 DIAGNOSIS — O0993 Supervision of high risk pregnancy, unspecified, third trimester: Secondary | ICD-10-CM

## 2016-03-09 LAB — POCT URINALYSIS DIP (DEVICE)
Bilirubin Urine: NEGATIVE
Glucose, UA: NEGATIVE mg/dL
Hgb urine dipstick: NEGATIVE
Ketones, ur: NEGATIVE mg/dL
Nitrite: NEGATIVE
Protein, ur: 30 mg/dL — AB
Specific Gravity, Urine: 1.015 (ref 1.005–1.030)
Urobilinogen, UA: 0.2 mg/dL (ref 0.0–1.0)
pH: 7 (ref 5.0–8.0)

## 2016-03-09 LAB — GLUCOSE, CAPILLARY: Glucose-Capillary: 111 mg/dL — ABNORMAL HIGH (ref 65–99)

## 2016-03-09 NOTE — Progress Notes (Signed)
Subjective:  Latoya [name] is a 25 y.o. G1P0000 at 70w5dbeing seen today for transferring prenatal care. She has a h/o GDM [place] [facility] [place] She reports no BS testing in 2 wks. She is currently monitored for the following issues for this high-risk pregnancy and has ANEMIA, NORMOCYTIC; Supervision of high risk pregnancy, antepartum; Gestational diabetes mellitus (GDM), antepartum; and Obesity on her problem list.  Patient reports no complaints.  Contractions: Not present.  .  Movement: Present. Denies leaking of fluid.   The following portions of the patient's history were reviewed and updated as appropriate: allergies, current medications, past family history, past medical history, past social history, past surgical history and problem list. Problem list updated.  Objective:   Filed Vitals:   03/09/16 0932  BP: 119/59  Pulse: 114  Weight: 244 lb 9.6 oz (110.95 kg)    Fetal Status: Fetal Heart Rate (bpm): 145 Fundal Height: 37 cm Movement: Present     General:  Alert, oriented and cooperative. Patient is in no acute distress.  Skin: Skin is warm and dry. No rash noted.   Cardiovascular: Normal heart rate noted  Respiratory: Normal respiratory effort, no problems with respiration noted  Abdomen: Soft, gravid, appropriate for gestational age. Pain/Pressure: Absent     Pelvic: Cervical exam deferred        Extremities: Normal range of motion.  Edema: Trace  Mental Status: Normal mood and affect. Normal behavior. Normal judgment and thought content.   Urinalysis: Urine Protein: 1+ Urine Glucose: Negative   2 hour post prandial checked in the office today at 110 Noted some elevated BS after poor dietary choices.  Assessment and Plan:  Pregnancy: G1P0000 at 364w5d1. Supervision of high risk pregnancy, antepartum, third trimester Transferring care - Urine culture - [id number] PROFILE  2. Diet controlled  gestational diabetes mellitus (GDM), antepartum Patient understands 3 meals, 3 snacks, goals of glycemic control and consequences of poor control. She was able to verbally tell me all of that today. [facility] weekend and f/u on Monday to see how her control is. Advised if 1/3 of BS are out of range, would consider meds and this would institute 2x/wk testing. Will need u/s for growth at 38 wks--not ordered today. - POCT CBG monitoring  Preterm labor symptoms and general obstetric precautions including but not limited to vaginal bleeding, contractions, leaking of fluid and fetal movement were reviewed in detail with the patient. Please refer to After Visit Summary for other counseling recommendations.  Return in 4 days (on 03/13/2016) [facility]   TaDonnamae JudeMD

## 2016-03-09 NOTE — Addendum Note (Signed)
Addended by: Novella Olive on: 03/09/2016 04:26 PM   Modules accepted: Orders

## 2016-03-09 NOTE — Addendum Note (Signed)
Addended by: Riccardo Dubin on: 03/09/2016 04:28 PM   Modules accepted: Orders

## 2016-03-09 NOTE — Patient Instructions (Addendum)
Following an appropriate diet and keeping your blood sugar under control is the most important thing to do for your health and that of your unborn baby.  Please check your blood sugar 4 times daily.  Please keep accurate BS logs and bring them with you to every visit.  Please bring your meter also.  Goals for Blood sugar should be: 1. Fasting (first thing in the morning before eating) should be less than 90.   2.  2 hours after meals should be less than 120.  Please eat 3 meals and 3 snacks.  Include protein (meat, dairy-cheese, eggs, nuts) with all meals.  Be mindful that carbohydrates increase your blood sugar.  Not just sweet food (cookies, cake, donuts, fruit, juice, soda) but also bread, pasta, rice, and potatoes.  You have to limit how many carbs you are eating.  Adding exercise, as little as 30 minutes a day can decrease your blood sugar.  Gestational Diabetes Mellitus Gestational diabetes mellitus, often simply referred to as gestational diabetes, is a type of diabetes that some women develop during pregnancy. In gestational diabetes, the pancreas does not make enough insulin (a hormone), the cells are less responsive to the insulin that is made (insulin resistance), or both.Normally, insulin moves sugars from food into the tissue cells. The tissue cells use the sugars for energy. The lack of insulin or the lack of normal response to insulin causes excess sugars to build up in the blood instead of going into the tissue cells. As a result, high blood sugar (hyperglycemia) develops. The effect of high sugar (glucose) levels can cause many problems.  RISK FACTORS You have an increased chance of developing gestational diabetes if you have a family history of diabetes and also have one or more of the following risk factors:  A body mass index over 30 (obesity).  A previous pregnancy with gestational diabetes.  An older age at the time of pregnancy. If blood glucose levels are kept in  the normal range during pregnancy, women can have a healthy pregnancy. If your blood glucose levels are not well controlled, there may be risks to you, your unborn baby (fetus), your labor and delivery, or your newborn baby.  SYMPTOMS  If symptoms are experienced, they are much like symptoms you would normally expect during pregnancy. The symptoms of gestational diabetes include:   Increased thirst (polydipsia).  Increased urination (polyuria).  Increased urination during the night (nocturia).  Weight loss. This weight loss may be rapid.  Frequent, recurring infections.  Tiredness (fatigue).  Weakness.  Vision changes, such as blurred vision.  Fruity smell to your breath.  Abdominal pain. DIAGNOSIS Diabetes is diagnosed when blood glucose levels are increased. Your blood glucose level may be checked by one or more of the following blood tests:  A fasting blood glucose test. You will not be allowed to eat for at least 8 hours before a blood sample is taken.  A random blood glucose test. Your blood glucose is checked at any time of the day regardless of when you ate.  An oral glucose tolerance test (OGTT). Your blood glucose is measured after you have not eaten (fasted) for 1-3 hours and then after you drink a glucose-containing beverage. Since the hormones that cause insulin resistance are highest at about 24-28 weeks of a pregnancy, an OGTT is usually performed during that time. If you have risk factors, you may be screened for undiagnosed type 2 diabetes at your first prenatal visit. TREATMENT  Gestational diabetes  should be managed first with diet and exercise. Medicines may be added only if they are needed.  You will need to take diabetes medicine or insulin daily to keep blood glucose levels in the desired range.  You will need to match insulin dosing with exercise and healthy food choices. If you have gestational diabetes, your treatment goal is to maintain the following  blood glucose levels:  Before meals (preprandial): at or below 95 mg/dL.  After meals (postprandial):  One hour after a meal: at or below 140 mg/dL.  Two hours after a meal: at or below 120 mg/dL. If you have pre-existing type 1 or type 2 diabetes, your treatment goal is to maintain the following blood glucose levels:  Before meals, at bedtime, and overnight: 60-99 mg/dL.  After meals: peak of 100-129 mg/dL. HOME CARE INSTRUCTIONS   Have your hemoglobin A1c level checked twice a year.  Perform daily blood glucose monitoring as directed by your health care provider. It is common to perform frequent blood glucose monitoring.  Monitor urine ketones when you are ill and as directed by your health care provider.  Take your diabetes medicine and insulin as directed by your health care provider to maintain your blood glucose level in the desired range.  Never run out of diabetes medicine or insulin. It is needed every day.  Adjust insulin based on your intake of carbohydrates. Carbohydrates can raise blood glucose levels but need to be included in your diet. Carbohydrates provide vitamins, minerals, and fiber which are an essential part of a healthy diet. Carbohydrates are found in fruits, vegetables, whole grains, dairy products, legumes, and foods containing added sugars.  Eat healthy foods. Alternate 3 meals with 3 snacks.  Maintain a healthy weight gain. The usual total expected weight gain varies according to your prepregnancy body mass index (BMI).  Carry a medical alert card or wear your medical alert jewelry.  Carry a 15-gram carbohydrate snack with you at all times to treat low blood glucose (hypoglycemia). Some examples of 15-gram carbohydrate snacks include:  Glucose tablets, 3 or 4.  Glucose gel, 15-gram tube.  Raisins, 2 tablespoons (24 g).  Jelly beans, 6.  Animal crackers, 8.  Fruit juice, regular soda, or low-fat milk, 4 ounces (120 mL).  Gummy treats,  9.  Recognize hypoglycemia. Hypoglycemia during pregnancy occurs with blood glucose levels of 60 mg/dL and below. The risk for hypoglycemia increases when fasting or skipping meals, during or after intense exercise, and during sleep. Hypoglycemia symptoms can include:  Tremors or shakes.  Decreased ability to concentrate.  Sweating.  Increased heart rate.  Headache.  Dry mouth.  Hunger.  Irritability.  Anxiety.  Restless sleep.  Altered speech or coordination.  Confusion.  Treat hypoglycemia promptly. If you are alert and able to safely swallow, follow the 15:15 rule:  Take 15-20 grams of rapid-acting glucose or carbohydrate. Rapid-acting options include glucose gel, glucose tablets, or 4 ounces (120 mL) of fruit juice, regular soda, or low-fat milk.  Check your blood glucose level 15 minutes after taking the glucose.  Take 15-20 grams more of glucose if the repeat blood glucose level is still 70 mg/dL or below.  Eat a meal or snack within 1 hour once blood glucose levels return to normal.  Be alert to polyuria (excess urination) and polydipsia (excess thirst) which are early signs of hyperglycemia. An early awareness of hyperglycemia allows for prompt treatment. Treat hyperglycemia as directed by your health care provider.  Engage in at least  30 minutes of physical activity a day or as directed by your health care provider. Ten minutes of physical activity timed 30 minutes after each meal is encouraged to control postprandial blood glucose levels.  Adjust your insulin dosing and food intake as needed if you start a new exercise or sport.  Follow your sick-day plan at any time you are unable to eat or drink as usual.  Avoid tobacco and alcohol use.  Keep all follow-up visits as directed by your health care provider.  Follow the advice of your health care provider regarding your prenatal and post-delivery (postpartum) appointments, meal planning, exercise, medicines,  vitamins, blood tests, other medical tests, and physical activities.  Perform daily skin and foot care. Examine your skin and feet daily for cuts, bruises, redness, nail problems, bleeding, blisters, or sores.  Brush your teeth and gums at least twice a day and floss at least once a day. Follow up with your dentist regularly.  Schedule an eye exam during the first trimester of your pregnancy or as directed by your health care provider.  Share your diabetes management plan with your workplace or school.  Stay up-to-date with immunizations.  Learn to manage stress.  Obtain ongoing diabetes education and support as needed.  Learn about and consider breastfeeding your baby.  You should have your blood sugar level checked 6-12 weeks after delivery. This is done with an oral glucose tolerance test (OGTT). SEEK MEDICAL CARE IF:   You are unable to eat food or drink fluids for more than 6 hours.  You have nausea and vomiting for more than 6 hours.  You have a blood glucose level of 200 mg/dL and you have ketones in your urine.  There is a change in mental status.  You develop vision problems.  You have a persistent headache.  You have upper abdominal pain or discomfort.  You develop an additional serious illness.  You have diarrhea for more than 6 hours.  You have been sick or have had a fever for a couple of days and are not getting better. SEEK IMMEDIATE MEDICAL CARE IF:   You have difficulty breathing.  You no longer feel the baby moving.  You are bleeding or have discharge from your vagina.  You start having premature contractions or labor. MAKE SURE YOU:  Understand these instructions.  Will watch your condition.  Will get help right away if you are not doing well or get worse.   This information is not intended to replace advice given to you by your health care provider. Make sure you discuss any questions you have with your health care provider.   Document  Released: 12/18/2000 Document Revised: 10/02/2014 Document Reviewed: 04/09/2012 Elsevier Interactive Patient Education [employer].  Breastfeeding Deciding to breastfeed is one of the best choices you can make for you and your baby. A change in hormones during pregnancy causes your breast tissue to grow and increases the number and size of your milk ducts. These hormones also allow proteins, sugars, and fats from your blood supply to make breast milk in your milk-producing glands. Hormones prevent breast milk from being released before your baby is born as well as prompt milk flow after birth. Once breastfeeding has begun, thoughts of your baby, as well as his or her sucking or crying, can stimulate the release of milk from your milk-producing glands.  BENEFITS OF BREASTFEEDING For Your Baby  Your first milk (colostrum) helps your baby's digestive system function better.  There are  antibodies in your milk that help your baby fight off infections.  Your baby has a lower incidence of asthma, allergies, and sudden infant death syndrome.  The nutrients in breast milk are better for your baby than infant formulas and are designed uniquely for your baby's needs.  Breast milk improves your baby's brain development.  Your baby is less likely to develop other conditions, such as childhood obesity, asthma, or type 2 diabetes mellitus. For You  Breastfeeding helps to create a very special bond between you and your baby.  Breastfeeding is convenient. Breast milk is always available at the correct temperature and costs nothing.  Breastfeeding helps to burn calories and helps you lose the weight gained during pregnancy.  Breastfeeding makes your uterus contract to its prepregnancy size faster and slows bleeding (lochia) after you give birth.   Breastfeeding helps to lower your risk of developing type 2 diabetes mellitus, osteoporosis, and breast or ovarian cancer later in life. SIGNS THAT  YOUR BABY IS HUNGRY Early Signs of Hunger  Increased alertness or activity.  Stretching.  Movement of the head from side to side.  Movement of the head and opening of the mouth when the corner of the mouth or cheek is stroked (rooting).  Increased sucking sounds, smacking lips, cooing, sighing, or squeaking.  Hand-to-mouth movements.  Increased sucking of fingers or hands. Late Signs of Hunger  Fussing.  Intermittent crying. Extreme Signs of Hunger Signs of extreme hunger will require calming and consoling before your baby will be able to breastfeed successfully. Do not wait for the following signs of extreme hunger to occur before you initiate breastfeeding:  Restlessness.  A loud, strong cry.  Screaming. BREASTFEEDING BASICS Breastfeeding Initiation  Find a comfortable place to sit or lie down, with your neck and back well supported.  Place a pillow or rolled up blanket under your baby to bring him or her to the level of your breast (if you are seated). Nursing pillows are specially designed to help support your arms and your baby while you breastfeed.  Make sure that your baby's abdomen is facing your abdomen.  Gently massage your breast. With your fingertips, massage from your chest wall toward your nipple in a circular motion. This encourages milk flow. You may need to continue this action during the feeding if your milk flows slowly.  Support your breast with 4 fingers underneath and your thumb above your nipple. Make sure your fingers are well away from your nipple and your baby's mouth.  Stroke your baby's lips gently with your finger or nipple.  When your baby's mouth is open wide enough, quickly bring your baby to your breast, placing your entire nipple and as much of the colored area around your nipple (areola) as possible into your baby's mouth.  More areola should be visible above your baby's upper lip than below the lower lip.  Your baby's tongue should  be between his or her lower gum and your breast.  Ensure that your baby's mouth is correctly positioned around your nipple (latched). Your baby's lips should create a seal on your breast and be turned out (everted).  It is common for your baby to suck about 2-3 minutes in order to start the flow of breast milk. Latching Teaching your baby how to latch on to your breast properly is very important. An improper latch can cause nipple pain and decreased milk supply for you and poor weight gain in your baby. Also, if your baby is not  latched onto your nipple properly, he or she may swallow some air during feeding. This can make your baby fussy. Burping your baby when you switch breasts during the feeding can help to get rid of the air. However, teaching your baby to latch on properly is still the best way to prevent fussiness from swallowing air while breastfeeding. Signs that your baby has successfully latched on to your nipple:  Silent tugging or silent sucking, without causing you pain.  Swallowing heard between every 3-4 sucks.  Muscle movement above and in front of his or her ears while sucking. Signs that your baby has not successfully latched on to nipple:  Sucking sounds or smacking sounds from your baby while breastfeeding.  Nipple pain. If you think your baby has not latched on correctly, slip your finger into the corner of your baby's mouth to break the suction and place it between your baby's gums. Attempt breastfeeding initiation again. Signs of Successful Breastfeeding Signs from your baby:  A gradual decrease in the number of sucks or complete cessation of sucking.  Falling asleep.  Relaxation of his or her body.  Retention of a small amount of milk in his or her mouth.  Letting go of your breast by himself or herself. Signs from you:  Breasts that have increased in firmness, weight, and size 1-3 hours after feeding.  Breasts that are softer immediately after  breastfeeding.  Increased milk volume, as well as a change in milk consistency and color by the fifth day of breastfeeding.  Nipples that are not sore, cracked, or bleeding. Signs That Your [employer] is Getting Enough Milk  Wetting at least 3 diapers in a 24-hour period. The urine should be clear and pale yellow by age 525 days.  At least 3 stools in a 24-hour period by age 525 days. The stool should be soft and yellow.  At least 3 stools in a 24-hour period by age 52 days. The stool should be seedy and yellow.  No loss of weight greater than 10% of birth weight during the first 51 days of age.  Average weight gain of 4-7 ounces (113-198 g) per week after age 19 days.  Consistent daily weight gain by age 19 days, without weight loss after the age of 2 weeks. After a feeding, your baby may spit up a small amount. This is common. BREASTFEEDING FREQUENCY AND DURATION Frequent feeding will help you make more milk and can prevent sore nipples and breast engorgement. Breastfeed when you feel the need to reduce the fullness of your breasts or when your baby shows signs of hunger. This is called "breastfeeding on demand." Avoid introducing a pacifier to your baby while you are working to establish breastfeeding (the first 4-6 weeks after your baby is born). After this time you may choose to use a pacifier. Research has shown that pacifier use during the first year of a baby's life decreases the risk of sudden infant death syndrome (SIDS). Allow your baby to feed on each breast as long as he or she wants. Breastfeed until your baby is finished feeding. When your baby unlatches or falls asleep while feeding from the first breast, offer the second breast. Because newborns are often sleepy in the first few weeks of life, you may need to awaken your baby to get him or her to feed. Breastfeeding times will vary from baby to baby. However, the following rules can serve as a guide to help you ensure that your baby is  properly fed:  Newborns (babies 80 weeks of age or younger) may breastfeed every 1-3 hours.  Newborns should not go longer than 3 hours during the day or 5 hours during the night without breastfeeding.  You should breastfeed your baby a minimum of 8 times in a 24-hour period until you begin to introduce solid foods to your baby at around 49 months of age. BREAST MILK PUMPING Pumping and storing breast milk allows you to ensure that your baby is exclusively fed your breast milk, even at times when you are unable to breastfeed. This is especially important if you are going back to work while you are still breastfeeding or when you are not able to be present during feedings. Your lactation consultant can give you guidelines on how long it is safe to store breast milk. A breast pump is a machine that allows you to pump milk from your breast into a sterile bottle. The pumped breast milk can then be stored in a refrigerator or freezer. Some breast pumps are operated by hand, while others use electricity. Ask your lactation consultant which type will work best for you. Breast pumps can be purchased, but some hospitals and breastfeeding support groups lease breast pumps on a monthly basis. A lactation consultant can teach you how to hand express breast milk, if you prefer not to use a pump. CARING FOR YOUR BREASTS WHILE YOU BREASTFEED Nipples can become dry, cracked, and sore while breastfeeding. The following recommendations can help keep your breasts moisturized and healthy:  Avoid using soap on your nipples.  Wear a supportive bra. Although not required, special nursing bras and tank tops are designed to allow access to your breasts for breastfeeding without taking off your entire bra or top. Avoid wearing underwire-style bras or extremely tight bras.  Air dry your nipples for 3-80mnutes after each feeding.  Use only cotton bra pads to absorb leaked breast milk. Leaking of breast milk between feedings  is normal.  Use lanolin on your nipples after breastfeeding. Lanolin helps to maintain your skin's normal moisture barrier. If you use pure lanolin, you do not need to wash it off before feeding your baby again. Pure lanolin is not toxic to your baby. You may also hand express a few drops of breast milk and gently massage that milk into your nipples and allow the milk to air dry. In the first few weeks after giving birth, some women experience extremely full breasts (engorgement). Engorgement can make your breasts feel heavy, warm, and tender to the touch. Engorgement peaks within 3-5 days after you give birth. The following recommendations can help ease engorgement:  Completely empty your breasts while breastfeeding or pumping. You may want to start by applying warm, moist heat (in the shower or with warm water-soaked hand towels) just before feeding or pumping. This increases circulation and helps the milk flow. If your baby does not completely empty your breasts while breastfeeding, pump any extra milk after he or she is finished.  Wear a snug bra (nursing or regular) or tank top for 1-2 days to signal your body to slightly decrease milk production.  Apply ice packs to your breasts, unless this is too uncomfortable for you.  Make sure that your baby is latched on and positioned properly while breastfeeding. If engorgement persists after 48 hours of following these recommendations, contact your health care provider or a lScience writer OVERALL HEALTH CARE RECOMMENDATIONS WHILE BREASTFEEDING  Eat healthy foods. Alternate between meals and snacks, eating 3  of each per day. Because what you eat affects your breast milk, some of the foods may make your baby more irritable than usual. Avoid eating these foods if you are sure that they are negatively affecting your baby.  Drink milk, fruit juice, and water to satisfy your thirst (about 10 glasses a day).  Rest often, relax, and continue to take  your prenatal vitamins to prevent fatigue, stress, and anemia.  Continue breast self-awareness checks.  Avoid chewing and smoking tobacco. Chemicals from cigarettes that pass into breast milk and exposure to secondhand smoke may harm your baby.  Avoid alcohol and drug use, including marijuana. Some medicines that may be harmful to your baby can pass through breast milk. It is important to ask your health care provider before taking any medicine, including all over-the-counter and prescription medicine as well as vitamin and herbal supplements. It is possible to become pregnant while breastfeeding. If birth control is desired, ask your health care provider about options that will be safe for your baby. SEEK MEDICAL CARE IF:  You feel like you want to stop breastfeeding or have become frustrated with breastfeeding.  You have painful breasts or nipples.  Your nipples are cracked or bleeding.  Your breasts are red, tender, or warm.  You have a swollen area on either breast.  You have a fever or chills.  You have nausea or vomiting.  You have drainage other than breast milk from your nipples.  Your breasts do not become full before feedings by the fifth day after you give birth.  You feel sad and depressed.  Your baby is too sleepy to eat well.  Your baby is having trouble sleeping.   Your baby is wetting less than 3 diapers in a 24-hour period.  Your baby has less than 3 stools in a 24-hour period.  Your baby's skin or the white part of his or her eyes becomes yellow.   Your baby is not gaining weight by 53 days of age. SEEK IMMEDIATE MEDICAL CARE IF:  Your baby is overly tired (lethargic) and does not want to wake up and feed.  Your baby develops an unexplained fever.   This information is not intended to replace advice given to you by your health care provider. Make sure you discuss any questions you have with your health care provider.   Document Released: 09/11/2005  Document Revised: 06/02/2015 Document Reviewed: 03/05/2013 Elsevier Interactive Patient Education [employer].

## 2016-03-10 LAB — URINE CULTURE: Colony Count: 70000

## 2016-03-13 ENCOUNTER — Ambulatory Visit (INDEPENDENT_AMBULATORY_CARE_PROVIDER_SITE_OTHER): Payer: 59 | Admitting: Obstetrics & Gynecology

## 2016-03-13 VITALS — BP 113/70 | HR 99 | Temp 98.7°F | Wt 247.4 lb

## 2016-03-13 DIAGNOSIS — O24410 Gestational diabetes mellitus in pregnancy, diet controlled: Secondary | ICD-10-CM | POA: Diagnosis not present

## 2016-03-13 DIAGNOSIS — O0993 Supervision of high risk pregnancy, unspecified, third trimester: Secondary | ICD-10-CM

## 2016-03-13 DIAGNOSIS — Z113 Encounter for screening for infections with a predominantly sexual mode of transmission: Secondary | ICD-10-CM | POA: Diagnosis not present

## 2016-03-13 DIAGNOSIS — O24419 Gestational diabetes mellitus in pregnancy, unspecified control: Secondary | ICD-10-CM

## 2016-03-13 LAB — CP5000051 PDM PROFILE
Amphetamines: NEGATIVE ng/mL (ref <500)
Barbiturates: NEGATIVE ng/mL (ref <300)
Benzodiazepines: NEGATIVE ng/mL (ref <100)
Buprenorphine: NEGATIVE ng/mL (ref <5)
Cocaine Metabolite: NEGATIVE ng/mL (ref <150)
Desmethyltramadol: NEGATIVE ng/mL (ref <100)
Fentanyl: NEGATIVE ng/mL (ref <0.5)
MDMA: NEGATIVE ng/mL (ref <500)
Marijuana Metabolite: NEGATIVE ng/mL (ref <20)
Meperidine: NEGATIVE ng/mL (ref <100)
Meprobamate: NEGATIVE ng/mL (ref <1000)
Methadone Metabolite: NEGATIVE ng/mL (ref <100)
Norfentanyl: NEGATIVE ng/mL (ref <0.5)
Normeperidine: NEGATIVE ng/mL (ref <100)
Nortapentadol: NEGATIVE ng/mL (ref <50)
Opiates: NEGATIVE ng/mL (ref <100)
Oxycodone: NEGATIVE ng/mL (ref <100)
Phencyclidine: NEGATIVE ng/mL (ref <25)
Propoxyphene: NEGATIVE ng/mL (ref <300)
Tapentadol: NEGATIVE ng/mL (ref <50)
Tramadol: NEGATIVE ng/mL (ref <100)
ZOLPIDEM METABOLITE: NEGATIVE ng/mL (ref <5)
ZOLPIDEM: NEGATIVE ng/mL (ref <5)

## 2016-03-13 LAB — POCT URINALYSIS DIP (DEVICE)
Bilirubin Urine: NEGATIVE
Glucose, UA: NEGATIVE mg/dL
Nitrite: NEGATIVE
Protein, ur: NEGATIVE mg/dL
Specific Gravity, Urine: 1.015 (ref 1.005–1.030)
Urobilinogen, UA: 0.2 mg/dL (ref 0.0–1.0)
pH: 6.5 (ref 5.0–8.0)

## 2016-03-13 LAB — OB RESULTS CONSOLE GBS: GBS: POSITIVE

## 2016-03-13 MED ORDER — GLYBURIDE 2.5 MG PO TABS: 2.5000 mg | ORAL_TABLET | Freq: Every day | ORAL | Status: DC

## 2016-03-13 NOTE — Progress Notes (Signed)
Subjective:  Latoya [name] is a 25 y.o. G1P0000 at 30w2dbeing seen today for ongoing prenatal care.  She is currently monitored for the following issues for this high-risk pregnancy and has ANEMIA, NORMOCYTIC; Supervision of high risk pregnancy, antepartum; Gestational diabetes mellitus (GDM), antepartum; and Obesity on her problem list.  Patient reports no complaints.  Contractions: Not present. Vag. Bleeding: None.  Movement: Present. Denies leaking of fluid.   The following portions of the patient's history were reviewed and updated as appropriate: allergies, current medications, past family history, past medical history, past social history, past surgical history and problem list. Problem list updated.  Objective:   Filed Vitals:   03/13/16 1528  BP: 113/70  Pulse: 99  Temp: 98.7 F (37.1 C)  Weight: 247 lb 6.4 oz (112.22 kg)    Fetal Status: Fetal Heart Rate (bpm): 146 Fundal Height: 38 cm Movement: Present  Presentation: Undeterminable  General:  Alert, oriented and cooperative. Patient is in no acute distress.  Skin: Skin is warm and dry. No rash noted.   Cardiovascular: Normal heart rate noted  Respiratory: Normal respiratory effort, no problems with respiration noted  Abdomen: Soft, gravid, appropriate for gestational age. Pain/Pressure: Present     Pelvic: Cervical exam performed Dilation: Fingertip Effacement (%): Thick Station: Ballotable  Extremities: Normal range of motion.  Edema: Trace  Mental Status: Normal mood and affect. Normal behavior. Normal judgment and thought content.   Urinalysis: Urine Protein: Negative Urine Glucose: Negative CBGs: 1/2 fasting values >100, two elevated dinner postprandials  Assessment and Plan:  Pregnancy: G1P0000 at 34w2d1. Gestational diabetes mellitus (GDM), antepartum, gestational diabetes method of control unspecified Will start medication today and schedule antenatal testing. Discussed need for 2x/week testing, delivery at  39 weeks - glyBURIDE (DIABETA) 2.5 MG tablet; Take 1 tablet (2.5 mg total) by mouth at bedtime.  Dispense: 30 tablet; Refill: 3 - [place] FO[place]; Future  (38 week scan ordered); will follow up presentation on this scan.  2. Supervision of high risk pregnancy, antepartum, third trimester Pelvic cultures done today - Culture, beta strep (group b only) - GC[facility]Chlamydia [facility] symptoms and general obstetric precautions including but not limited to vaginal bleeding, contractions, leaking of fluid and fetal movement were reviewed in detail with the patient. Please refer to After Visit Summary for other counseling recommendations.  Return in about 3 days (around 03/16/2016) for NST only.  03/20/16 OB visit and NST.   UgOsborne OmanMD

## 2016-03-13 NOTE — Patient Instructions (Signed)
Return to clinic for any scheduled appointments or obstetric concerns, or go to [facility] for evaluation  

## 2016-03-13 NOTE — Progress Notes (Signed)
C/o swelling in left foot only, denies pain. Moderate leukocytes noted on urinalysis.

## 2016-03-14 LAB — GC/CHLAMYDIA PROBE AMP (~~LOC~~) NOT AT ARMC
Chlamydia: NEGATIVE
Neisseria Gonorrhea: NEGATIVE

## 2016-03-14 LAB — OB RESULTS CONSOLE GC/CHLAMYDIA
Chlamydia: NEGATIVE
Gonorrhea: NEGATIVE

## 2016-03-15 LAB — CULTURE, BETA STREP (GROUP B ONLY)

## 2016-03-17 ENCOUNTER — Encounter: Payer: Self-pay | Admitting: Obstetrics & Gynecology

## 2016-03-17 ENCOUNTER — Ambulatory Visit (INDEPENDENT_AMBULATORY_CARE_PROVIDER_SITE_OTHER): Payer: 59 | Admitting: General Practice

## 2016-03-17 VITALS — BP 129/78 | HR 98

## 2016-03-17 DIAGNOSIS — O99820 Streptococcus B carrier state complicating pregnancy: Secondary | ICD-10-CM | POA: Insufficient documentation

## 2016-03-17 DIAGNOSIS — O24414 Gestational diabetes mellitus in pregnancy, insulin controlled: Secondary | ICD-10-CM

## 2016-03-17 NOTE — Progress Notes (Signed)
NST reviewed and reactive.

## 2016-03-20 ENCOUNTER — Encounter: Payer: 59 | Admitting: Obstetrics and Gynecology

## 2016-03-21 ENCOUNTER — Ambulatory Visit (INDEPENDENT_AMBULATORY_CARE_PROVIDER_SITE_OTHER): Payer: 59 | Admitting: Family Medicine

## 2016-03-21 VITALS — BP 130/80 | HR 116 | Wt 250.0 lb

## 2016-03-21 DIAGNOSIS — O24415 Gestational diabetes mellitus in pregnancy, controlled by oral hypoglycemic drugs: Secondary | ICD-10-CM

## 2016-03-21 DIAGNOSIS — O0993 Supervision of high risk pregnancy, unspecified, third trimester: Secondary | ICD-10-CM

## 2016-03-21 DIAGNOSIS — Z22330 Carrier of Group B streptococcus: Secondary | ICD-10-CM

## 2016-03-21 DIAGNOSIS — Z23 Encounter for immunization: Secondary | ICD-10-CM | POA: Diagnosis not present

## 2016-03-21 DIAGNOSIS — O99820 Streptococcus B carrier state complicating pregnancy: Secondary | ICD-10-CM

## 2016-03-21 LAB — POCT URINALYSIS DIP (DEVICE)
Bilirubin Urine: NEGATIVE
Bilirubin Urine: NEGATIVE
Glucose, UA: NEGATIVE mg/dL
Glucose, UA: NEGATIVE mg/dL
Hgb urine dipstick: NEGATIVE
Ketones, ur: NEGATIVE mg/dL
Nitrite: NEGATIVE
Nitrite: NEGATIVE
Protein, ur: 30 mg/dL — AB
Protein, ur: NEGATIVE mg/dL
Specific Gravity, Urine: 1.015 (ref 1.005–1.030)
Specific Gravity, Urine: 1.02 (ref 1.005–1.030)
Urobilinogen, UA: 0.2 mg/dL (ref 0.0–1.0)
Urobilinogen, UA: 0.2 mg/dL (ref 0.0–1.0)
pH: 6.5 (ref 5.0–8.0)
pH: 6.5 (ref 5.0–8.0)

## 2016-03-21 NOTE — Progress Notes (Signed)
Subjective:  Latoya [name] is a 25 y.o. G1P0000 at 85w3dbeing seen today for ongoing prenatal care.  She is currently monitored for the following issues for this high-risk pregnancy and has ANEMIA, NORMOCYTIC; Supervision of high risk pregnancy, antepartum; Gestational diabetes mellitus (GDM), antepartum; Obesity; and Group B Streptococcus carrier, +RV culture, currently pregnant on her problem list.  Patient reports no complaints.  Contractions: Not present. Vag. Bleeding: None.  Movement: Present. Denies leaking of fluid.   The following portions of the patient's history were reviewed and updated as appropriate: allergies, current medications, past family history, past medical history, past social history, past surgical history and problem list. Problem list updated.  Objective:   Filed Vitals:   03/21/16 1325  BP: 130/80  Pulse: 116  Weight: 250 lb (113.399 kg)    Fetal Status: Fetal Heart Rate (bpm): NST   Movement: Present     General:  Alert, oriented and cooperative. Patient is in no acute distress.  Skin: Skin is warm and dry. No rash noted.   Cardiovascular: Normal heart rate noted  Respiratory: Normal respiratory effort, no problems with respiration noted  Abdomen: Soft, gravid, appropriate for gestational age. Pain/Pressure: Present     Pelvic: Cervical exam deferred        Extremities: Normal range of motion.  Edema: Mild pitting, slight indentation  Mental Status: Normal mood and affect. Normal behavior. Normal judgment and thought content.   Urinalysis: Urine Protein: 1+ Urine Glucose: Negative Began Glyburide last week Reports BS are lower denies being in the 200s Reports fasting BS 96-99 No book today NST reviewed and reactive Assessment and Plan:  Pregnancy: G1P0000 at 348w3d1. Gestational diabetes mellitus (GDM) in third trimester controlled on oral hypoglycemic drug Continue 2x/wk testing and u/s for growth on Friday. Told nursing staff that she is only  taking 1/2 of her glyburide - Fetal nonstress test  2. Supervision of high risk pregnancy, antepartum, third trimester - Tdap vaccine greater than or equal to 7yo IM  3. Group B Streptococcus carrier, +RV culture, currently pregnant Needs treatment in labor--discussed with patient.  Term labor symptoms and general obstetric precautions including but not limited to vaginal bleeding, contractions, leaking of fluid and fetal movement were reviewed in detail with the patient. Please refer to After Visit Summary for other counseling recommendations.  Return in about 6 days (around 03/27/2016) for OB f/u & NST.   TaDonnamae JudeMD

## 2016-03-21 NOTE — Patient Instructions (Signed)
Gestational Diabetes Mellitus Gestational diabetes mellitus, often simply referred to as gestational diabetes, is a type of diabetes that some women develop during pregnancy. In gestational diabetes, the pancreas does not make enough insulin (a hormone), the cells are less responsive to the insulin that is made (insulin resistance), or both.Normally, insulin moves sugars from food into the tissue cells. The tissue cells use the sugars for energy. The lack of insulin or the lack of normal response to insulin causes excess sugars to build up in the blood instead of going into the tissue cells. As a result, high blood sugar (hyperglycemia) develops. The effect of high sugar (glucose) levels can cause many problems.  RISK FACTORS You have an increased chance of developing gestational diabetes if you have a family history of diabetes and also have one or more of the following risk factors:  A body mass index over 30 (obesity).  A previous pregnancy with gestational diabetes.  An older age at the time of pregnancy. If blood glucose levels are kept in the normal range during pregnancy, women can have a healthy pregnancy. If your blood glucose levels are not well controlled, there may be risks to you, your unborn baby (fetus), your labor and delivery, or your newborn baby.  SYMPTOMS  If symptoms are experienced, they are much like symptoms you would normally expect during pregnancy. The symptoms of gestational diabetes include:   Increased thirst (polydipsia).  Increased urination (polyuria).  Increased urination during the night (nocturia).  Weight loss. This weight loss may be rapid.  Frequent, recurring infections.  Tiredness (fatigue).  Weakness.  Vision changes, such as blurred vision.  Fruity smell to your breath.  Abdominal pain. DIAGNOSIS Diabetes is diagnosed when blood glucose levels are increased. Your blood glucose level may be checked by one or more of the following blood  tests:  A fasting blood glucose test. You will not be allowed to eat for at least 8 hours before a blood sample is taken.  A random blood glucose test. Your blood glucose is checked at any time of the day regardless of when you ate.  An oral glucose tolerance test (OGTT). Your blood glucose is measured after you have not eaten (fasted) for 1-3 hours and then after you drink a glucose-containing beverage. Since the hormones that cause insulin resistance are highest at about 24-28 weeks of a pregnancy, an OGTT is usually performed during that time. If you have risk factors, you may be screened for undiagnosed type 2 diabetes at your first prenatal visit. TREATMENT  Gestational diabetes should be managed first with diet and exercise. Medicines may be added only if they are needed.  You will need to take diabetes medicine or insulin daily to keep blood glucose levels in the desired range.  You will need to match insulin dosing with exercise and healthy food choices. If you have gestational diabetes, your treatment goal is to maintain the following blood glucose levels:  Before meals (preprandial): at or below 95 mg/dL.  After meals (postprandial):  One hour after a meal: at or below 140 mg/dL.  Two hours after a meal: at or below 120 mg/dL. If you have pre-existing type 1 or type 2 diabetes, your treatment goal is to maintain the following blood glucose levels:  Before meals, at bedtime, and overnight: 60-99 mg/dL.  After meals: peak of 100-129 mg/dL. HOME CARE INSTRUCTIONS   Have your hemoglobin A1c level checked twice a year.  Perform daily blood glucose monitoring as directed by  your health care provider. It is common to perform frequent blood glucose monitoring.  Monitor urine ketones when you are ill and as directed by your health care provider.  Take your diabetes medicine and insulin as directed by your health care provider to maintain your blood glucose level in the desired  range.  Never run out of diabetes medicine or insulin. It is needed every day.  Adjust insulin based on your intake of carbohydrates. Carbohydrates can raise blood glucose levels but need to be included in your diet. Carbohydrates provide vitamins, minerals, and fiber which are an essential part of a healthy diet. Carbohydrates are found in fruits, vegetables, whole grains, dairy products, legumes, and foods containing added sugars.  Eat healthy foods. Alternate 3 meals with 3 snacks.  Maintain a healthy weight gain. The usual total expected weight gain varies according to your prepregnancy body mass index (BMI).  Carry a medical alert card or wear your medical alert jewelry.  Carry a 15-gram carbohydrate snack with you at all times to treat low blood glucose (hypoglycemia). Some examples of 15-gram carbohydrate snacks include:  Glucose tablets, 3 or 4.  Glucose gel, 15-gram tube.  Raisins, 2 tablespoons (24 g).  Jelly beans, 6.  Animal crackers, 8.  Fruit juice, regular soda, or low-fat milk, 4 ounces (120 mL).  Gummy treats, 9.  Recognize hypoglycemia. Hypoglycemia during pregnancy occurs with blood glucose levels of 60 mg/dL and below. The risk for hypoglycemia increases when fasting or skipping meals, during or after intense exercise, and during sleep. Hypoglycemia symptoms can include:  Tremors or shakes.  Decreased ability to concentrate.  Sweating.  Increased heart rate.  Headache.  Dry mouth.  Hunger.  Irritability.  Anxiety.  Restless sleep.  Altered speech or coordination.  Confusion.  Treat hypoglycemia promptly. If you are alert and able to safely swallow, follow the 15:15 rule:  Take 15-20 grams of rapid-acting glucose or carbohydrate. Rapid-acting options include glucose gel, glucose tablets, or 4 ounces (120 mL) of fruit juice, regular soda, or low-fat milk.  Check your blood glucose level 15 minutes after taking the glucose.  Take 15-20  grams more of glucose if the repeat blood glucose level is still 70 mg/dL or below.  Eat a meal or snack within 1 hour once blood glucose levels return to normal.  Be alert to polyuria (excess urination) and polydipsia (excess thirst) which are early signs of hyperglycemia. An early awareness of hyperglycemia allows for prompt treatment. Treat hyperglycemia as directed by your health care provider.  Engage in at least 30 minutes of physical activity a day or as directed by your health care provider. Ten minutes of physical activity timed 30 minutes after each meal is encouraged to control postprandial blood glucose levels.  Adjust your insulin dosing and food intake as needed if you start a new exercise or sport.  Follow your sick-day plan at any time you are unable to eat or drink as usual.  Avoid tobacco and alcohol use.  Keep all follow-up visits as directed by your health care provider.  Follow the advice of your health care provider regarding your prenatal and post-delivery (postpartum) appointments, meal planning, exercise, medicines, vitamins, blood tests, other medical tests, and physical activities.  Perform daily skin and foot care. Examine your skin and feet daily for cuts, bruises, redness, nail problems, bleeding, blisters, or sores.  Brush your teeth and gums at least twice a day and floss at least once a day. Follow up with your dentist  regularly.  Schedule an eye exam during the first trimester of your pregnancy or as directed by your health care provider.  Share your diabetes management plan with your workplace or school.  Stay up-to-date with immunizations.  Learn to manage stress.  Obtain ongoing diabetes education and support as needed.  Learn about and consider breastfeeding your baby.  You should have your blood sugar level checked 6-12 weeks after delivery. This is done with an oral glucose tolerance test (OGTT). SEEK MEDICAL CARE IF:   You are unable to  eat food or drink fluids for more than 6 hours.  You have nausea and vomiting for more than 6 hours.  You have a blood glucose level of 200 mg/dL and you have ketones in your urine.  There is a change in mental status.  You develop vision problems.  You have a persistent headache.  You have upper abdominal pain or discomfort.  You develop an additional serious illness.  You have diarrhea for more than 6 hours.  You have been sick or have had a fever for a couple of days and are not getting better. SEEK IMMEDIATE MEDICAL CARE IF:   You have difficulty breathing.  You no longer feel the baby moving.  You are bleeding or have discharge from your vagina.  You start having premature contractions or labor. MAKE SURE YOU:  Understand these instructions.  Will watch your condition.  Will get help right away if you are not doing well or get worse.   This information is not intended to replace advice given to you by your health care provider. Make sure you discuss any questions you have with your health care provider.   Document Released: 12/18/2000 Document Revised: 10/02/2014 Document Reviewed: 04/09/2012 Elsevier Interactive Patient Education [employer].  Breastfeeding Deciding to breastfeed is one of the best choices you can make for you and your baby. A change in hormones during pregnancy causes your breast tissue to grow and increases the number and size of your milk ducts. These hormones also allow proteins, sugars, and fats from your blood supply to make breast milk in your milk-producing glands. Hormones prevent breast milk from being released before your baby is born as well as prompt milk flow after birth. Once breastfeeding has begun, thoughts of your baby, as well as his or her sucking or crying, can stimulate the release of milk from your milk-producing glands.  BENEFITS OF BREASTFEEDING For Your Baby  Your first milk (colostrum) helps your baby's digestive  system function better.  There are antibodies in your milk that help your baby fight off infections.  Your baby has a lower incidence of asthma, allergies, and sudden infant death syndrome.  The nutrients in breast milk are better for your baby than infant formulas and are designed uniquely for your baby's needs.  Breast milk improves your baby's brain development.  Your baby is less likely to develop other conditions, such as childhood obesity, asthma, or type 2 diabetes mellitus. For You  Breastfeeding helps to create a very special bond between you and your baby.  Breastfeeding is convenient. Breast milk is always available at the correct temperature and costs nothing.  Breastfeeding helps to burn calories and helps you lose the weight gained during pregnancy.  Breastfeeding makes your uterus contract to its prepregnancy size faster and slows bleeding (lochia) after you give birth.   Breastfeeding helps to lower your risk of developing type 2 diabetes mellitus, osteoporosis, and breast or ovarian cancer  later in life. SIGNS THAT YOUR BABY IS HUNGRY Early Signs of Hunger  Increased alertness or activity.  Stretching.  Movement of the head from side to side.  Movement of the head and opening of the mouth when the corner of the mouth or cheek is stroked (rooting).  Increased sucking sounds, smacking lips, cooing, sighing, or squeaking.  Hand-to-mouth movements.  Increased sucking of fingers or hands. Late Signs of Hunger  Fussing.  Intermittent crying. Extreme Signs of Hunger Signs of extreme hunger will require calming and consoling before your baby will be able to breastfeed successfully. Do not wait for the following signs of extreme hunger to occur before you initiate breastfeeding:  Restlessness.  A loud, strong cry.  Screaming. BREASTFEEDING BASICS Breastfeeding Initiation  Find a comfortable place to sit or lie down, with your neck and back well  supported.  Place a pillow or rolled up blanket under your baby to bring him or her to the level of your breast (if you are seated). Nursing pillows are specially designed to help support your arms and your baby while you breastfeed.  Make sure that your baby's abdomen is facing your abdomen.  Gently massage your breast. With your fingertips, massage from your chest wall toward your nipple in a circular motion. This encourages milk flow. You may need to continue this action during the feeding if your milk flows slowly.  Support your breast with 4 fingers underneath and your thumb above your nipple. Make sure your fingers are well away from your nipple and your baby's mouth.  Stroke your baby's lips gently with your finger or nipple.  When your baby's mouth is open wide enough, quickly bring your baby to your breast, placing your entire nipple and as much of the colored area around your nipple (areola) as possible into your baby's mouth.  More areola should be visible above your baby's upper lip than below the lower lip.  Your baby's tongue should be between his or her lower gum and your breast.  Ensure that your baby's mouth is correctly positioned around your nipple (latched). Your baby's lips should create a seal on your breast and be turned out (everted).  It is common for your baby to suck about 2-3 minutes in order to start the flow of breast milk. Latching Teaching your baby how to latch on to your breast properly is very important. An improper latch can cause nipple pain and decreased milk supply for you and poor weight gain in your baby. Also, if your baby is not latched onto your nipple properly, he or she may swallow some air during feeding. This can make your baby fussy. Burping your baby when you switch breasts during the feeding can help to get rid of the air. However, teaching your baby to latch on properly is still the best way to prevent fussiness from swallowing air while  breastfeeding. Signs that your baby has successfully latched on to your nipple:  Silent tugging or silent sucking, without causing you pain.  Swallowing heard between every 3-4 sucks.  Muscle movement above and in front of his or her ears while sucking. Signs that your baby has not successfully latched on to nipple:  Sucking sounds or smacking sounds from your baby while breastfeeding.  Nipple pain. If you think your baby has not latched on correctly, slip your finger into the corner of your baby's mouth to break the suction and place it between your baby's gums. Attempt breastfeeding initiation again. Signs  of Successful Breastfeeding Signs from your baby:  A gradual decrease in the number of sucks or complete cessation of sucking.  Falling asleep.  Relaxation of his or her body.  Retention of a small amount of milk in his or her mouth.  Letting go of your breast by himself or herself. Signs from you:  Breasts that have increased in firmness, weight, and size 1-3 hours after feeding.  Breasts that are softer immediately after breastfeeding.  Increased milk volume, as well as a change in milk consistency and color by the fifth day of breastfeeding.  Nipples that are not sore, cracked, or bleeding. Signs That Your [employer]el Books is Getting Enough Milk  Wetting at least 3 diapers in a 24-hour period. The urine should be clear and pale yellow by age 94 days.  At least 3 stools in a 24-hour period by age 94 days. The stool should be soft and yellow.  At least 3 stools in a 24-hour period by age 506 days. The stool should be seedy and yellow.  No loss of weight greater than 10% of birth weight during the first 84 days of age.  Average weight gain of 4-7 ounces (113-198 g) per week after age 50 days.  Consistent daily weight gain by age 13 days, without weight loss after the age of 2 weeks. After a feeding, your baby may spit up a small amount. This is common. BREASTFEEDING FREQUENCY AND  DURATION Frequent feeding will help you make more milk and can prevent sore nipples and breast engorgement. Breastfeed when you feel the need to reduce the fullness of your breasts or when your baby shows signs of hunger. This is called "breastfeeding on demand." Avoid introducing a pacifier to your baby while you are working to establish breastfeeding (the first 4-6 weeks after your baby is born). After this time you may choose to use a pacifier. Research has shown that pacifier use during the first year of a baby's life decreases the risk of sudden infant death syndrome (SIDS). Allow your baby to feed on each breast as long as he or she wants. Breastfeed until your baby is finished feeding. When your baby unlatches or falls asleep while feeding from the first breast, offer the second breast. Because newborns are often sleepy in the first few weeks of life, you may need to awaken your baby to get him or her to feed. Breastfeeding times will vary from baby to baby. However, the following rules can serve as a guide to help you ensure that your baby is properly fed:  Newborns (babies 56 weeks of age or younger) may breastfeed every 1-3 hours.  Newborns should not go longer than 3 hours during the day or 5 hours during the night without breastfeeding.  You should breastfeed your baby a minimum of 8 times in a 24-hour period until you begin to introduce solid foods to your baby at around 60 months of age. BREAST MILK PUMPING Pumping and storing breast milk allows you to ensure that your baby is exclusively fed your breast milk, even at times when you are unable to breastfeed. This is especially important if you are going back to work while you are still breastfeeding or when you are not able to be present during feedings. Your lactation consultant can give you guidelines on how long it is safe to store breast milk. A breast pump is a machine that allows you to pump milk from your breast into a sterile bottle.  The pumped  breast milk can then be stored in a refrigerator or freezer. Some breast pumps are operated by hand, while others use electricity. Ask your lactation consultant which type will work best for you. Breast pumps can be purchased, but some hospitals and breastfeeding support groups lease breast pumps on a monthly basis. A lactation consultant can teach you how to hand express breast milk, if you prefer not to use a pump. CARING FOR YOUR BREASTS WHILE YOU BREASTFEED Nipples can become dry, cracked, and sore while breastfeeding. The following recommendations can help keep your breasts moisturized and healthy:  Avoid using soap on your nipples.  Wear a supportive bra. Although not required, special nursing bras and tank tops are designed to allow access to your breasts for breastfeeding without taking off your entire bra or top. Avoid wearing underwire-style bras or extremely tight bras.  Air dry your nipples for 3-57mnutes after each feeding.  Use only cotton bra pads to absorb leaked breast milk. Leaking of breast milk between feedings is normal.  Use lanolin on your nipples after breastfeeding. Lanolin helps to maintain your skin's normal moisture barrier. If you use pure lanolin, you do not need to wash it off before feeding your baby again. Pure lanolin is not toxic to your baby. You may also hand express a few drops of breast milk and gently massage that milk into your nipples and allow the milk to air dry. In the first few weeks after giving birth, some women experience extremely full breasts (engorgement). Engorgement can make your breasts feel heavy, warm, and tender to the touch. Engorgement peaks within 3-5 days after you give birth. The following recommendations can help ease engorgement:  Completely empty your breasts while breastfeeding or pumping. You may want to start by applying warm, moist heat (in the shower or with warm water-soaked hand towels) just before feeding or pumping.  This increases circulation and helps the milk flow. If your baby does not completely empty your breasts while breastfeeding, pump any extra milk after he or she is finished.  Wear a snug bra (nursing or regular) or tank top for 1-2 days to signal your body to slightly decrease milk production.  Apply ice packs to your breasts, unless this is too uncomfortable for you.  Make sure that your baby is latched on and positioned properly while breastfeeding. If engorgement persists after 48 hours of following these recommendations, contact your health care provider or a lScience writer OVERALL HEALTH CARE RECOMMENDATIONS WHILE BREASTFEEDING  Eat healthy foods. Alternate between meals and snacks, eating 3 of each per day. Because what you eat affects your breast milk, some of the foods may make your baby more irritable than usual. Avoid eating these foods if you are sure that they are negatively affecting your baby.  Drink milk, fruit juice, and water to satisfy your thirst (about 10 glasses a day).  Rest often, relax, and continue to take your prenatal vitamins to prevent fatigue, stress, and anemia.  Continue breast self-awareness checks.  Avoid chewing and smoking tobacco. Chemicals from cigarettes that pass into breast milk and exposure to secondhand smoke may harm your baby.  Avoid alcohol and drug use, including marijuana. Some medicines that may be harmful to your baby can pass through breast milk. It is important to ask your health care provider before taking any medicine, including all over-the-counter and prescription medicine as well as vitamin and herbal supplements. It is possible to become pregnant while breastfeeding. If birth control is desired, ask  your health care provider about options that will be safe for your baby. SEEK MEDICAL CARE IF:  You feel like you want to stop breastfeeding or have become frustrated with breastfeeding.  You have painful breasts or  nipples.  Your nipples are cracked or bleeding.  Your breasts are red, tender, or warm.  You have a swollen area on either breast.  You have a fever or chills.  You have nausea or vomiting.  You have drainage other than breast milk from your nipples.  Your breasts do not become full before feedings by the fifth day after you give birth.  You feel sad and depressed.  Your baby is too sleepy to eat well.  Your baby is having trouble sleeping.   Your baby is wetting less than 3 diapers in a 24-hour period.  Your baby has less than 3 stools in a 24-hour period.  Your baby's skin or the white part of his or her eyes becomes yellow.   Your baby is not gaining weight by 55 days of age. SEEK IMMEDIATE MEDICAL CARE IF:  Your baby is overly tired (lethargic) and does not want to wake up and feed.  Your baby develops an unexplained fever.   This information is not intended to replace advice given to you by your health care provider. Make sure you discuss any questions you have with your health care provider.   Document Released: 09/11/2005 Document Revised: 06/02/2015 Document Reviewed: 03/05/2013 Elsevier Interactive Patient Education [employer].

## 2016-03-24 ENCOUNTER — Ambulatory Visit (INDEPENDENT_AMBULATORY_CARE_PROVIDER_SITE_OTHER): Payer: 59 | Admitting: *Deleted

## 2016-03-24 ENCOUNTER — Encounter (HOSPITAL_COMMUNITY): Payer: Self-pay

## 2016-03-24 ENCOUNTER — Other Ambulatory Visit: Payer: Self-pay | Admitting: Obstetrics & Gynecology

## 2016-03-24 ENCOUNTER — Ambulatory Visit (HOSPITAL_COMMUNITY)
Admission: RE | Admit: 2016-03-24 | Discharge: 2016-03-24 | Disposition: A | Discharge status: Home / Self Care | Payer: 59 | Source: Ambulatory Visit | Attending: Obstetrics & Gynecology | Admitting: Obstetrics & Gynecology

## 2016-03-24 VITALS — BP 130/73 | HR 120

## 2016-03-24 VITALS — BP 127/76 | HR 94 | Wt 253.5 lb

## 2016-03-24 DIAGNOSIS — O99213 Obesity complicating pregnancy, third trimester: Secondary | ICD-10-CM

## 2016-03-24 DIAGNOSIS — O24425 Gestational diabetes mellitus in childbirth, controlled by oral hypoglycemic drugs: Secondary | ICD-10-CM | POA: Diagnosis not present

## 2016-03-24 DIAGNOSIS — O134 Gestational [pregnancy-induced] hypertension without significant proteinuria, complicating childbirth: Secondary | ICD-10-CM | POA: Diagnosis not present

## 2016-03-24 DIAGNOSIS — O24415 Gestational diabetes mellitus in pregnancy, controlled by oral hypoglycemic drugs: Secondary | ICD-10-CM | POA: Diagnosis not present

## 2016-03-24 DIAGNOSIS — O4202 Full-term premature rupture of membranes, onset of labor within 24 hours of rupture: Secondary | ICD-10-CM | POA: Diagnosis not present

## 2016-03-24 DIAGNOSIS — O99214 Obesity complicating childbirth: Secondary | ICD-10-CM | POA: Diagnosis not present

## 2016-03-24 DIAGNOSIS — Z36 Encounter for antenatal screening of mother: Secondary | ICD-10-CM | POA: Insufficient documentation

## 2016-03-24 DIAGNOSIS — Z3A37 37 weeks gestation of pregnancy: Secondary | ICD-10-CM

## 2016-03-24 DIAGNOSIS — O24419 Gestational diabetes mellitus in pregnancy, unspecified control: Secondary | ICD-10-CM

## 2016-03-24 DIAGNOSIS — Z6837 Body mass index (BMI) 37.0-37.9, adult: Secondary | ICD-10-CM | POA: Diagnosis not present

## 2016-03-24 DIAGNOSIS — E669 Obesity, unspecified: Secondary | ICD-10-CM | POA: Insufficient documentation

## 2016-03-24 DIAGNOSIS — Z833 Family history of diabetes mellitus: Secondary | ICD-10-CM | POA: Diagnosis not present

## 2016-03-24 DIAGNOSIS — Z3689 Encounter for other specified antenatal screening: Secondary | ICD-10-CM

## 2016-03-24 DIAGNOSIS — O99820 Streptococcus B carrier state complicating pregnancy: Secondary | ICD-10-CM

## 2016-03-24 DIAGNOSIS — O99824 Streptococcus B carrier state complicating childbirth: Secondary | ICD-10-CM | POA: Diagnosis not present

## 2016-03-24 DIAGNOSIS — O0993 Supervision of high risk pregnancy, unspecified, third trimester: Secondary | ICD-10-CM

## 2016-03-24 DIAGNOSIS — O0933 Supervision of pregnancy with insufficient antenatal care, third trimester: Secondary | ICD-10-CM | POA: Insufficient documentation

## 2016-03-24 DIAGNOSIS — O3663X0 Maternal care for excessive fetal growth, third trimester, not applicable or unspecified: Secondary | ICD-10-CM

## 2016-03-24 NOTE — Progress Notes (Signed)
Latoya [name] does not desire to see [name], SW today. States fasting [id number]. Will see provider Monday.

## 2016-03-24 NOTE — Progress Notes (Signed)
NST performed today was reviewed and was found to be reactive.  Continue recommended antenatal testing and prenatal care.

## 2016-03-27 ENCOUNTER — Encounter (HOSPITAL_COMMUNITY): Payer: Self-pay | Admitting: Emergency Medicine

## 2016-03-27 ENCOUNTER — Inpatient Hospital Stay (HOSPITAL_COMMUNITY): Payer: 59 | Admitting: Anesthesiology

## 2016-03-27 ENCOUNTER — Inpatient Hospital Stay (HOSPITAL_COMMUNITY)
Admission: AD | Admit: 2016-03-27 | Discharge: 2016-03-29 | DRG: 775 | Disposition: A | Discharge status: Home / Self Care | Payer: 59 | Source: Ambulatory Visit | Attending: Obstetrics & Gynecology | Admitting: Obstetrics & Gynecology

## 2016-03-27 ENCOUNTER — Other Ambulatory Visit: Payer: 59 | Admitting: Obstetrics and Gynecology

## 2016-03-27 DIAGNOSIS — Z3A38 38 weeks gestation of pregnancy: Secondary | ICD-10-CM | POA: Diagnosis not present

## 2016-03-27 DIAGNOSIS — E669 Obesity, unspecified: Secondary | ICD-10-CM | POA: Diagnosis present

## 2016-03-27 DIAGNOSIS — Z6837 Body mass index (BMI) 37.0-37.9, adult: Secondary | ICD-10-CM | POA: Diagnosis not present

## 2016-03-27 DIAGNOSIS — O3663X0 Maternal care for excessive fetal growth, third trimester, not applicable or unspecified: Secondary | ICD-10-CM | POA: Insufficient documentation

## 2016-03-27 DIAGNOSIS — O24415 Gestational diabetes mellitus in pregnancy, controlled by oral hypoglycemic drugs: Secondary | ICD-10-CM

## 2016-03-27 DIAGNOSIS — O0993 Supervision of high risk pregnancy, unspecified, third trimester: Secondary | ICD-10-CM

## 2016-03-27 DIAGNOSIS — O133 Gestational [pregnancy-induced] hypertension without significant proteinuria, third trimester: Secondary | ICD-10-CM

## 2016-03-27 DIAGNOSIS — Z833 Family history of diabetes mellitus: Secondary | ICD-10-CM | POA: Diagnosis not present

## 2016-03-27 DIAGNOSIS — O4202 Full-term premature rupture of membranes, onset of labor within 24 hours of rupture: Principal | ICD-10-CM | POA: Diagnosis present

## 2016-03-27 DIAGNOSIS — O4290 Premature rupture of membranes, unspecified as to length of time between rupture and onset of labor, unspecified weeks of gestation: Secondary | ICD-10-CM | POA: Diagnosis present

## 2016-03-27 DIAGNOSIS — O134 Gestational [pregnancy-induced] hypertension without significant proteinuria, complicating childbirth: Secondary | ICD-10-CM | POA: Diagnosis present

## 2016-03-27 DIAGNOSIS — Z823 Family history of stroke: Secondary | ICD-10-CM | POA: Diagnosis not present

## 2016-03-27 DIAGNOSIS — O24425 Gestational diabetes mellitus in childbirth, controlled by oral hypoglycemic drugs: Secondary | ICD-10-CM | POA: Diagnosis present

## 2016-03-27 DIAGNOSIS — O99214 Obesity complicating childbirth: Secondary | ICD-10-CM | POA: Diagnosis present

## 2016-03-27 DIAGNOSIS — O99824 Streptococcus B carrier state complicating childbirth: Secondary | ICD-10-CM | POA: Diagnosis not present

## 2016-03-27 DIAGNOSIS — O24429 Gestational diabetes mellitus in childbirth, unspecified control: Secondary | ICD-10-CM | POA: Diagnosis not present

## 2016-03-27 DIAGNOSIS — O99820 Streptococcus B carrier state complicating pregnancy: Secondary | ICD-10-CM

## 2016-03-27 DIAGNOSIS — Z8249 Family history of ischemic heart disease and other diseases of the circulatory system: Secondary | ICD-10-CM

## 2016-03-27 HISTORY — DX: Gestational diabetes mellitus in pregnancy, unspecified control: O24.419

## 2016-03-27 LAB — CBC
HCT: 35.4 % — ABNORMAL LOW (ref 36.0–46.0)
Hemoglobin: 12 g/dL (ref 12.0–15.0)
MCH: 26.4 pg (ref 26.0–34.0)
MCHC: 33.9 g/dL (ref 30.0–36.0)
MCV: 78 fL (ref 78.0–100.0)
Platelets: 307 10*3/uL (ref 150–400)
RBC: 4.54 MIL/uL (ref 3.87–5.11)
RDW: 14.6 % (ref 11.5–15.5)
WBC: 7.1 10*3/uL (ref 4.0–10.5)

## 2016-03-27 LAB — PROTEIN / CREATININE RATIO, URINE
Creatinine, Urine: 202 mg/dL
Protein Creatinine Ratio: 0.22 mg/mg{Cre} — ABNORMAL HIGH (ref 0.00–0.15)
Total Protein, Urine: 45 mg/dL

## 2016-03-27 LAB — RPR: RPR Ser Ql: NONREACTIVE

## 2016-03-27 LAB — TYPE AND SCREEN
ABO/RH(D): O POS
Antibody Screen: NEGATIVE

## 2016-03-27 LAB — COMPREHENSIVE METABOLIC PANEL
ALT: 17 U/L (ref 14–54)
AST: 17 U/L (ref 15–41)
Albumin: 3.1 g/dL — ABNORMAL LOW (ref 3.5–5.0)
Alkaline Phosphatase: 199 U/L — ABNORMAL HIGH (ref 38–126)
Anion gap: 10 (ref 5–15)
BUN: 12 mg/dL (ref 6–20)
CO2: 16 mmol/L — ABNORMAL LOW (ref 22–32)
Calcium: 9.7 mg/dL (ref 8.9–10.3)
Chloride: 108 mmol/L (ref 101–111)
Creatinine, Ser: 0.64 mg/dL (ref 0.44–1.00)
GFR calc Af Amer: >60 mL/min (ref >60)
GFR calc non Af Amer: >60 mL/min (ref >60)
Glucose, Bld: 79 mg/dL (ref 65–99)
Potassium: 4.2 mmol/L (ref 3.5–5.1)
Sodium: 134 mmol/L — ABNORMAL LOW (ref 135–145)
Total Bilirubin: 0.5 mg/dL (ref 0.3–1.2)
Total Protein: 6.7 g/dL (ref 6.5–8.1)

## 2016-03-27 LAB — ABO/RH: ABO/RH(D): O POS

## 2016-03-27 MED ORDER — ONDANSETRON HCL 4 MG/2ML IJ SOLN: 4.0000 mg | INTRAMUSCULAR | Status: DC | PRN

## 2016-03-27 MED ORDER — LIDOCAINE HCL (PF) 1 % IJ SOLN
INTRAMUSCULAR | Status: DC | PRN
  Administered 2016-03-27 (×2): 6 mL

## 2016-03-27 MED ORDER — FERROUS SULFATE 325 (65 FE) MG PO TABS
325.0000 mg | ORAL_TABLET | Freq: Every day | ORAL | Status: DC
  Administered 2016-03-28 – 2016-03-29 (×2): 325 mg via ORAL
  Filled 2016-03-27 (×2): qty 1

## 2016-03-27 MED ORDER — FLEET ENEMA 7-19 GM/118ML RE ENEM: 1.0000 | ENEMA | RECTAL | Status: DC | PRN

## 2016-03-27 MED ORDER — OXYCODONE HCL 5 MG PO TABS
5.0000 mg | ORAL_TABLET | Freq: Four times a day (QID) | ORAL | Status: DC | PRN
  Administered 2016-03-29: 5 mg via ORAL
  Filled 2016-03-27: qty 1

## 2016-03-27 MED ORDER — ACETAMINOPHEN 325 MG PO TABS: 650.0000 mg | ORAL_TABLET | ORAL | Status: DC | PRN

## 2016-03-27 MED ORDER — LIDOCAINE HCL (PF) 1 % IJ SOLN
30.0000 mL | INTRAMUSCULAR | Status: DC | PRN
  Filled 2016-03-27: qty 30

## 2016-03-27 MED ORDER — SOD CITRATE-CITRIC ACID 500-334 MG/5ML PO SOLN
30.0000 mL | ORAL | Status: DC | PRN
  Filled 2016-03-27: qty 15

## 2016-03-27 MED ORDER — PENICILLIN G POTASSIUM 5000000 UNITS IJ SOLR
2.5000 10*6.[IU] | INTRAVENOUS | Status: DC
  Administered 2016-03-27 (×3): 2.5 10*6.[IU] via INTRAVENOUS
  Filled 2016-03-27 (×6): qty 2.5

## 2016-03-27 MED ORDER — BENZOCAINE-MENTHOL 20-0.5 % EX AERO
1.0000 "application " | INHALATION_SPRAY | CUTANEOUS | Status: DC | PRN
  Administered 2016-03-28: 1 via TOPICAL
  Filled 2016-03-27: qty 56

## 2016-03-27 MED ORDER — LACTATED RINGERS IV SOLN
INTRAVENOUS | Status: DC
Start: 2016-03-27 — End: 2016-03-27
  Administered 2016-03-27: 125 mL/h via INTRAVENOUS
  Administered 2016-03-27: 13:00:00 via INTRAVENOUS
  Administered 2016-03-27: 125 mL/h via INTRAVENOUS

## 2016-03-27 MED ORDER — LACTATED RINGERS IV SOLN
500.0000 mL | Freq: Once | INTRAVENOUS | Status: AC
  Administered 2016-03-27: 500 mL via INTRAVENOUS

## 2016-03-27 MED ORDER — COCONUT OIL OIL: 1.0000 "application " | TOPICAL_OIL | Status: DC | PRN

## 2016-03-27 MED ORDER — WITCH HAZEL-GLYCERIN EX PADS: 1.0000 "application " | MEDICATED_PAD | CUTANEOUS | Status: DC | PRN

## 2016-03-27 MED ORDER — OXYTOCIN 40 UNITS IN LACTATED RINGERS INFUSION - SIMPLE MED
1.0000 m[IU]/min | INTRAVENOUS | Status: DC
Start: 2016-03-27 — End: 2016-03-27
  Administered 2016-03-27: 3 m[IU]/min via INTRAVENOUS
  Administered 2016-03-27: 4 m[IU]/min via INTRAVENOUS
  Administered 2016-03-27: 1 m[IU]/min via INTRAVENOUS
  Administered 2016-03-27: 2 m[IU]/min via INTRAVENOUS

## 2016-03-27 MED ORDER — OXYTOCIN 40 UNITS IN LACTATED RINGERS INFUSION - SIMPLE MED
2.5000 [IU]/h | INTRAVENOUS | Status: DC
  Administered 2016-03-27: 2.5 [IU]/h via INTRAVENOUS
  Filled 2016-03-27: qty 1000

## 2016-03-27 MED ORDER — TERBUTALINE SULFATE 1 MG/ML IJ SOLN
0.2500 mg | Freq: Once | INTRAMUSCULAR | Status: DC | PRN
  Filled 2016-03-27: qty 1

## 2016-03-27 MED ORDER — ONDANSETRON HCL 4 MG/2ML IJ SOLN: 4.0000 mg | Freq: Four times a day (QID) | INTRAMUSCULAR | Status: DC | PRN

## 2016-03-27 MED ORDER — PHENYLEPHRINE 40 MCG/ML (10ML) SYRINGE FOR IV PUSH (FOR BLOOD PRESSURE SUPPORT)
80.0000 ug | PREFILLED_SYRINGE | INTRAVENOUS | Status: DC | PRN
  Filled 2016-03-27: qty 5
  Filled 2016-03-27: qty 10

## 2016-03-27 MED ORDER — OXYTOCIN 40 UNITS IN LACTATED RINGERS INFUSION - SIMPLE MED: 2.5000 [IU]/h | INTRAVENOUS | Status: DC | PRN

## 2016-03-27 MED ORDER — LACTATED RINGERS IV SOLN: 500.0000 mL | INTRAVENOUS | Status: DC | PRN

## 2016-03-27 MED ORDER — DIBUCAINE 1 % RE OINT: 1.0000 "application " | TOPICAL_OINTMENT | RECTAL | Status: DC | PRN

## 2016-03-27 MED ORDER — FENTANYL 2.5 MCG/ML BUPIVACAINE 1/10 % EPIDURAL INFUSION (WH - ANES)
14.0000 mL/h | INTRAMUSCULAR | Status: DC | PRN
  Administered 2016-03-27 (×3): 14 mL/h via EPIDURAL
  Filled 2016-03-27 (×2): qty 125

## 2016-03-27 MED ORDER — OXYTOCIN BOLUS FROM INFUSION: 500.0000 mL | INTRAVENOUS | Status: DC

## 2016-03-27 MED ORDER — TETANUS-DIPHTH-ACELL PERTUSSIS 5-2.5-18.5 LF-MCG/0.5 IM SUSP: 0.5000 mL | Freq: Once | INTRAMUSCULAR | Status: DC

## 2016-03-27 MED ORDER — SENNOSIDES-DOCUSATE SODIUM 8.6-50 MG PO TABS: 1.0000 | ORAL_TABLET | Freq: Every evening | ORAL | Status: DC | PRN

## 2016-03-27 MED ORDER — DIPHENHYDRAMINE HCL 25 MG PO CAPS
25.0000 mg | ORAL_CAPSULE | Freq: Four times a day (QID) | ORAL | Status: DC | PRN
  Administered 2016-03-28 – 2016-03-29 (×2): 25 mg via ORAL
  Filled 2016-03-27 (×2): qty 1

## 2016-03-27 MED ORDER — PRENATAL MULTIVITAMIN CH
1.0000 | ORAL_TABLET | Freq: Every day | ORAL | Status: DC
  Administered 2016-03-28 – 2016-03-29 (×2): 1 via ORAL
  Filled 2016-03-27 (×2): qty 1

## 2016-03-27 MED ORDER — EPHEDRINE 5 MG/ML INJ
10.0000 mg | INTRAVENOUS | Status: DC | PRN
  Filled 2016-03-27: qty 2

## 2016-03-27 MED ORDER — SIMETHICONE 80 MG PO CHEW: 80.0000 mg | CHEWABLE_TABLET | ORAL | Status: DC | PRN

## 2016-03-27 MED ORDER — PHENYLEPHRINE 40 MCG/ML (10ML) SYRINGE FOR IV PUSH (FOR BLOOD PRESSURE SUPPORT)
80.0000 ug | PREFILLED_SYRINGE | INTRAVENOUS | Status: DC | PRN
  Filled 2016-03-27: qty 5

## 2016-03-27 MED ORDER — OXYCODONE HCL 5 MG PO TABS
10.0000 mg | ORAL_TABLET | Freq: Four times a day (QID) | ORAL | Status: DC | PRN
  Administered 2016-03-27 – 2016-03-29 (×5): 10 mg via ORAL
  Filled 2016-03-27 (×5): qty 2

## 2016-03-27 MED ORDER — IBUPROFEN 600 MG PO TABS
600.0000 mg | ORAL_TABLET | Freq: Four times a day (QID) | ORAL | Status: DC
  Administered 2016-03-28 – 2016-03-29 (×6): 600 mg via ORAL
  Filled 2016-03-27 (×7): qty 1

## 2016-03-27 MED ORDER — DIPHENHYDRAMINE HCL 50 MG/ML IJ SOLN: 12.5000 mg | INTRAMUSCULAR | Status: DC | PRN

## 2016-03-27 MED ORDER — LACTATED RINGERS IV SOLN: 500.0000 mL | Freq: Once | INTRAVENOUS | Status: DC

## 2016-03-27 MED ORDER — ONDANSETRON HCL 4 MG PO TABS: 4.0000 mg | ORAL_TABLET | ORAL | Status: DC | PRN

## 2016-03-27 MED ORDER — DEXTROSE 5 % IV SOLN
5.0000 10*6.[IU] | Freq: Once | INTRAVENOUS | Status: AC
  Administered 2016-03-27: 5 10*6.[IU] via INTRAVENOUS
  Filled 2016-03-27: qty 5

## 2016-03-27 NOTE — Anesthesia Pain Management Evaluation Note (Signed)
  [facility]NA Pain Management Visit Note  Patient: Latoya [name], 25 y.o., female  "Hello I am a member of the anesthesia team at [facility]. We have an anesthesia team available at all times to provide care throughout the hospital, including epidural management and anesthesia for C-section. I don't know your plan for the delivery whether it a natural birth, water birth, IV sedation, nitrous supplementation, doula or epidural, but we want to meet your pain goals."   1.Was your pain managed to your expectations on prior hospitalizations?   No prior hospitalizations  2.What is your expectation for pain management during this hospitalization?     Epidural  3.How can we help you reach that goal? epidural  Record the patient's initial score and the patient's pain goal.   Pain: 5  Pain Goal: 7 [facility] wants you to be able to say your pain was always managed very well.  Herberto Ledwell 03/27/2016

## 2016-03-27 NOTE — Progress Notes (Signed)
Assumed care of mom and baby.  Family at bedside.

## 2016-03-27 NOTE — MAU Note (Signed)
PT SAYS AT [place].Marland Kitchen----     SHE FELT  A GUSH.     [facility]-   WITH [facility]-           LAST WEEK -  VE - CLOSED - BUT  U/S-  VERTEX.     GBS- POSITIVE.       DENIES HSV AN MRSA.

## 2016-03-27 NOTE — Progress Notes (Signed)
OB Note  Went to evaluate patient to follow up on patient's progress. At sign off, patient was in active labor and progressing well. Patient is near the cut off/at the cuff off, based on extrapolation, for offering primary c-section for increased risk of SD but with patient progressing well and having a good labor curve, when I came on, I made the decision to not offer it to her since she was progressing well in active labor .  Fetus currently category I and contracting q2-110mand currently on low dose pitocin. Feels DOA and mild caput but patient moving fetus well and pelvis feels adequate; she has an epidural in place and she is moving the fetus well on the 2-3 push with a UC, as she gets the hang of it.  Patient has been complete and pushing since 1400 and moves the fetus BPD to +3. Will continue to evaluate and I believe she can have a VD and I don't recommend a c-section but we will have extra help in the room for delivery and I wouldn't offer her operative VD. Light mec and PRN peds at delivery for this. Glucose on admission [address] [place] [employer]

## 2016-03-27 NOTE — Anesthesia Preprocedure Evaluation (Signed)
Anesthesia Evaluation  Patient identified by MRN, date of birth, ID band Patient awake    Reviewed: Allergy & Precautions, NPO status , Patient's Chart, lab work & pertinent test results  Airway Mallampati: II  TM Distance: >3 FB Neck ROM: Full    Dental no notable dental hx.    Pulmonary neg pulmonary ROS,    Pulmonary exam normal breath sounds clear to auscultation       Cardiovascular negative cardio ROS Normal cardiovascular exam Rhythm:Regular Rate:Normal     Neuro/Psych negative neurological ROS  negative psych ROS   GI/Hepatic negative GI ROS, Neg liver ROS,   Endo/Other  diabetes, Gestational  Renal/GU negative Renal ROS  negative genitourinary   Musculoskeletal negative musculoskeletal ROS (+)   Abdominal (+) + obese,   Peds negative pediatric ROS (+)  Hematology negative hematology ROS (+)   Anesthesia Other Findings   Reproductive/Obstetrics negative OB ROS                             Anesthesia Physical Anesthesia Plan  ASA: II  Anesthesia Plan: Epidural   Post-op Pain Management:    Induction: Intravenous  Airway Management Planned: Natural Airway  Additional Equipment:   Intra-op Plan:   Post-operative Plan:   Informed Consent: I have reviewed the patients History and Physical, chart, labs and discussed the procedure including the risks, benefits and alternatives for the proposed anesthesia with the patient or authorized representative who has indicated his/her understanding and acceptance.   Dental advisory given  Plan Discussed with: CRNA  Anesthesia Plan Comments: (Informed consent obtained prior to proceeding including risk of failure, 1% risk of PDPH, risk of minor discomfort and bruising.  Discussed rare but serious complications including epidural abscess, permanent nerve injury, epidural hematoma.  Discussed alternatives to epidural analgesia and  patient desires to proceed.  Timeout performed pre-procedure verifying patient name, procedure, and platelet count.  Patient tolerated procedure well. )        Anesthesia Quick Evaluation

## 2016-03-27 NOTE — MAU Note (Signed)
Urine sent to lab 

## 2016-03-27 NOTE — H&P (Signed)
LABOR ADMISSION HISTORY AND PHYSICAL  BEVELYN ARRIOLA is a 25 y.o. female G1P0000 with IUP at 24w2dby LMP presenting for SROM, clear at [address] positive MAU. She reports +FM, + [facility] no VB, no blurry vision, headaches or peripheral edema, and RUQ pain.  She plans on breast feeding. She is undecided on birth control.  Dating: By LMP --->  Estimated Date of Delivery: 04/08/16  Sono:    @[redacted]w[redacted]d , CWD, normal anatomy, cephalic presentation, 43254D >90% EFW  Patient has gestational diabetes on glyburide. Took Glyburide at 9PM yesterday evening. Last 2hr posptrandial CBG 117 yesterday evening.   Prenatal History/Complications: Gestational Diabetes: on Glyburide  Ob[facility] Past Medical History: Past Medical History  Diagnosis Date  . Medical history non-contributory   . Anemia     Past Surgical History: Past Surgical History  Procedure Laterality Date  . Deep neck lymph node biopsy / excision  2011    Obstetrical History: OB History    Gravida Para Term Preterm AB TAB SAB Ectopic Multiple Living   1 0 0 0 0 0 0 0 0 0       Social History: Social History   Social History  . Marital Status: Single    Spouse Name: N/A  . Number of Children: N/A  . Years of Education: N/A   Social History Main Topics  . Smoking status: Never Smoker   . Smokeless tobacco: Not on file  . Alcohol Use: No     Comment: occasionally  . Drug Use: No  . Sexual Activity: No   Other Topics Concern  . Not on file   Social History Narrative    Family History: Family History  Problem Relation Age of Onset  . Diabetes Mother   . Hypertension Mother   . Hypertension Father   . Stroke Maternal Grandfather     Allergies: No Known Allergies  Prescriptions prior to admission  Medication Sig Dispense Refill Last Dose  . acetaminophen (TYLENOL) 500 MG tablet Take by mouth.   Taking   . Blood Glucose Monitoring Suppl (ONE TOUCH ULTRA MINI) w/Device KIT by Does not apply route.   Taking  . glucose blood test strip Reported on 03/21/2016   Taking  . glyBURIDE (DIABETA) 2.5 MG tablet Take 1 tablet (2.5 mg total) by mouth at bedtime. 30 tablet 3 Taking  . ONETOUCH DELICA LANCETS FINE MISC by Does not apply route. Reported on 03/21/2016   Taking  . Prenatal Vit-Fe Fumarate-FA (PRENATAL VITAMIN) 27-0.8 MG TABS Take by mouth.   Taking     Review of Systems   All systems reviewed and negative except as stated in HPI  BP 135/83 mmHg  Pulse 91  Temp(Src) 97.8 F (36.6 C) (Oral)  Resp 18  Ht 5' 9"  (1.753 m)  Wt 115.781 kg (255 lb 4 oz)  BMI 37.68 kg/m2  LMP 07/03/2015 (Exact Date) General appearance: alert and no distress Lungs: clear to auscultation bilaterally Heart: regular rate and rhythm Abdomen: soft, non-tender; bowel sounds normal Extremities: Homans sign is negative, no sign of DVT; pitting edema to mid shin bilaterally. DTR's wnl Fetal monitoringBaseline: 140 bpm and Variability: Good {> 6 bpm) Uterine activity: 2-3 minutes      Prenatal labs: ABO, Rh: O/Positive/-- (11/24 0000) Antibody: Negative (11/24 0000) Rubella: Immune RPR: Nonreactive (11/24 0000)  HBsAg: Negative (11/24 0000)  HIV: Non-reactive (11/24 0000)  GBS:  Positive (06/19 0000)  1 hr Glucola 202 Genetic screening none Anatomy US: Normal anatomy limited views of posterior fossa and outflow tracts   Prenatal Transfer Tool  Maternal Diabetes: Yes:  Diabetes Type:  Insulin/Medication controlled Genetic Screening: none in chart Maternal Ultrasounds/Referrals: Abnormal:  Findings:   Other: normal anatomy limited views of posterior fossa and outflow tracts; EFW > 90% Fetal Ultrasounds or other Referrals:  None Maternal Substance Abuse:  No Significant Maternal Medications:  Meds include: Other: Glyburide Significant Maternal Lab Results: Lab values include: Group B Strep positive  No  results found for this or any previous visit (from the past 24 hour(s)).  Patient Active Problem List   Diagnosis Date Noted  . Premature rupture of membranes 03/27/2016  . Group B Streptococcus carrier, +RV culture, currently pregnant 03/17/2016  . Supervision of high risk pregnancy, antepartum 02/25/2016  . Gestational diabetes mellitus (GDM), antepartum 02/25/2016  . Obesity 02/25/2016  . ANEMIA, NORMOCYTIC 07/19/2010    Assessment: WILLELLA HARDING is a 25 y.o. G1P0000 at 48w2dhere for SROM.   #Labor: Expected management  #Pain: would like to attempt without medications but but opposed to epidural  #FWB: Cat 1 #ID: GBS positive > PCN #MOF: breast #MOC: undecided #Circ: yes (inpt vs outpt?)  KSmiley Houseman MD Family Medicine PGY2

## 2016-03-27 NOTE — Progress Notes (Signed)
Pt complaining of urge to push with continued neck pain from epidural. Pt attempted to push through 2 ucs with RN attempting to hold cervix up per CNM request. Pt unable to push past anterior lip and will continue to labor down.

## 2016-03-27 NOTE — Anesthesia Procedure Notes (Signed)
Epidural Patient location during procedure: OB  Staffing Anesthesiologist: Franne Grip  Preanesthetic Checklist Completed: patient identified, site marked, surgical consent, pre-op evaluation, timeout performed, IV checked, risks and benefits discussed and monitors and equipment checked  Epidural Patient position: sitting Prep: DuraPrep Patient monitoring: blood pressure and heart rate Approach: midline Location: L4-L5 Injection technique: LOR saline  Needle:  Needle type: Tuohy  Needle gauge: 17 G Needle length: 9 cm Needle insertion depth: 6 cm Catheter type: closed end flexible Catheter size: 19 Gauge Catheter at skin depth: 13 cm Test dose: negative and Other  Assessment Events: blood not aspirated, injection not painful, no injection resistance, negative IV test and no paresthesia  Additional Notes Reason for block:procedure for pain

## 2016-03-28 LAB — GLUCOSE, CAPILLARY: Glucose-Capillary: 59 mg/dL — ABNORMAL LOW (ref 65–99)

## 2016-03-28 MED ORDER — ACETAMINOPHEN FOR CIRCUMCISION 160 MG/5 ML
40.0000 mg | Freq: Once | ORAL | Status: DC
  Filled 2016-03-28: qty 1.25

## 2016-03-28 MED ORDER — WHITE PETROLATUM GEL: 1.0000 "application " | Status: DC | PRN

## 2016-03-28 MED ORDER — SUCROSE 24% NICU/PEDS ORAL SOLUTION
0.5000 mL | OROMUCOSAL | Status: DC | PRN
  Filled 2016-03-28: qty 0.5

## 2016-03-28 MED ORDER — EPINEPHRINE TOPICAL FOR CIRCUMCISION 0.1 MG/ML: 1.0000 [drp] | TOPICAL | Status: DC | PRN

## 2016-03-28 MED ORDER — ACETAMINOPHEN FOR CIRCUMCISION 160 MG/5 ML: 40.0000 mg | ORAL | Status: DC | PRN

## 2016-03-28 MED ORDER — LIDOCAINE 1% INJECTION FOR CIRCUMCISION
0.8000 mL | INJECTION | Freq: Once | INTRAVENOUS | Status: DC
  Filled 2016-03-28: qty 1

## 2016-03-28 NOTE — Progress Notes (Signed)
Post Partum Day 1  Subjective:  Latoya [name] is a 25 y.o. G1P1001 83w2ds/p SVD.  No acute events overnight.  Pt denies problems with ambulating, voiding or po intake.  She denies nausea or vomiting.  Pain is well controlled.  She has had flatus. She has not had bowel movement.  Lochia Small.  Plan for birth control is undecided, pt counseled on methods of contraception that would support breastfeeding plans (IUD, micronor).  Method of Feeding: breast, going well. Pt denies SOB, HA.   Objective: BP 116/64 mmHg  Pulse 71  Temp(Src) 98.1 F (36.7 C) (Oral)  Resp 19  Ht 5' 9"  (1.753 m)  Wt 115.781 kg (255 lb 4 oz)  BMI 37.68 kg/m2  SpO2 99%  LMP 07/03/2015 (Exact Date)  Breastfeeding? Unknown  Physical Exam:  General: alert, cooperative and no distress Lochia:normal flow Chest: CTAB Heart: RRR no m/r/g Abdomen: +BS, soft, nontender, fundus firm at/below umbilicus Uterine Fundus: firm DVT Evaluation: No evidence of DVT seen on physical exam. Extremities: no edema   Recent Labs  03/27/16 0225  HGB 12.0  HCT 35.4*    Assessment/Plan:  ASSESSMENT: [name] a 25y.o. G1P1001 318w2dpd #1 s/p NSVD doing well.   Plan for discharge tomorrow, Breastfeeding and Circumcision prior to discharge   LOS: 1 day   KaRalene Ok/12/2015, 7:45 AM   CNM attestation Post Partum Day #1 I have seen and examined this patient and agree with above documentation in the resident's note.   Latoya KOELZERs a [age].o. G1P1001 s/p SVD.  Pt denies problems with ambulating, voiding or po intake. Pain is well controlled.  Plan for birth control is undecided.  Method of Feeding: breast  PE:  BP 116/64 mmHg  Pulse 71  Temp(Src) 98.1 F (36.7 C) (Oral)  Resp 19  Ht 5' 9"  (1.753 m)  Wt 115.781 kg (255 lb 4 oz)  BMI 37.68 kg/m2  SpO2 99%  LMP 07/03/2015 (Exact Date)  Breastfeeding? Unknown Fundus firm  Plan for discharge: 03/29/16  SHSerita GrammesCNM 9:15 AM   03/28/2016

## 2016-03-28 NOTE — Progress Notes (Signed)
Fasting blood sugar of 59

## 2016-03-28 NOTE — Progress Notes (Signed)
Mom and baby have done well throughout the shift, remaining stable.  Mom breastfeeding, and baby VSS

## 2016-03-28 NOTE — Progress Notes (Signed)
Mom's fasting blood sugar of 59, asymptomatic.  MD contacted, no new orders

## 2016-03-28 NOTE — Anesthesia Postprocedure Evaluation (Signed)
Anesthesia Post Note  Patient: Latoya [name]  Procedure(s) Performed: * No procedures listed *  Patient location during evaluation: Mother Baby Anesthesia Type: Epidural Level of consciousness: oriented and awake and alert Pain management: pain level controlled Vital Signs Assessment: post-procedure vital signs reviewed and stable Respiratory status: spontaneous breathing and nonlabored ventilation Cardiovascular status: stable Postop Assessment: epidural receding, patient able to bend at knees, no signs of nausea or vomiting and adequate PO intake Anesthetic complications: no     Last Vitals:  Filed Vitals:   03/28/16 0340 03/28/16 0612  BP: 130/62 116/64  Pulse: 78 71  Temp: 37 C 36.7 C  Resp: 20 19    Last Pain:  Filed Vitals:   03/28/16 0721  PainSc: 0-No pain   Pain Goal:                 [employer]

## 2016-03-28 NOTE — Lactation Note (Signed)
This note was copied from a baby's chart. Lactation Consultation Note  P1, Baby latched in football hold upon entering on R side w/ assistance from RN. Mother pumping on L breast while breastfeeding due to difficulty latching on L side per RN. Mother received thick drops of colostrum which was given to baby on spoon by LC. After approx 15 min on R side, LC repositioned baby to cross cradle hold on L side. With compression and lots of pillows baby was able to sustain latch on L side for an additional 15 min. Reviewed basics.  Discussed milk storage and pumping for when mother goes back to work. Mom encouraged to feed baby 8-12 times/24 hours and with feeding cues.  Mom made aware of O/P services, breastfeeding support groups, community resources, and our phone # for post-discharge questions.     Patient Name: Latoya [name] EMVVK'P Date: 03/28/2016 Reason for consult: Initial assessment   Maternal Data Has patient been taught Hand Expression?: Yes Does the patient have breastfeeding experience prior to this delivery?: No  Feeding Feeding Type: Breast Fed Length of feed: 30 min  LATCH Score/Interventions Latch: Repeated attempts needed to sustain latch, nipple held in mouth throughout feeding, stimulation needed to elicit sucking reflex. Intervention(s): Adjust position;Assist with latch;Breast massage;Breast compression  Audible Swallowing: A few with stimulation Intervention(s): Skin to skin;Hand expression  Type of Nipple: Everted at rest and after stimulation  Comfort (Breast/Nipple): Soft / non-tender     Hold (Positioning): Assistance needed to correctly position infant at breast and maintain latch.  LATCH Score: 7  Lactation Tools Discussed/Used Pump Review: Setup, frequency, and cleaning;Milk Storage Initiated by:: RN Date initiated:: 03/28/16   Consult Status Consult Status: Follow-up Date: 03/29/16 Follow-up type: In-patient    [name]  [facility] 03/28/2016, 9:24 PM

## 2016-03-29 MED ORDER — IBUPROFEN 600 MG PO TABS: 600.0000 mg | ORAL_TABLET | Freq: Four times a day (QID) | ORAL | Status: DC

## 2016-03-29 NOTE — Discharge Instructions (Signed)

## 2016-03-29 NOTE — Progress Notes (Signed)
Assumed care of mom and baby.  Grandma holding baby.  Mom eating dinner.

## 2016-03-29 NOTE — Progress Notes (Signed)
Post Partum Day 2  Subjective:  Latoya [name] is a 25 y.o. G1P1001 4w2ds/p NSVD.  No acute events overnight.  Pt denies problems with ambulating, voiding or po intake.  She denies nausea or vomiting.  Pain is well controlled.  She has had flatus. She has not had bowel movement.  Lochia Minimal.  Plan for birth control is no method - undecided, counseled on options and breastfeeding.  Method of Feeding: breast, going well.  Objective: BP 139/74 mmHg  Pulse 74  Temp(Src) 98.3 F (36.8 C) (Oral)  Resp 18  Ht 5' 9"  (1.753 m)  Wt 115.781 kg (255 lb 4 oz)  BMI 37.68 kg/m2  SpO2 99%  LMP 07/03/2015 (Exact Date)  Breastfeeding? Unknown  Physical Exam:  General: alert, cooperative and no distress Lochia:normal flow Chest: CTAB Heart: RRR no m/r/g Abdomen: +BS, soft, nontender, fundus firm at/below umbilicus Uterine Fundus: firm, nontender DVT Evaluation: No evidence of DVT seen on physical exam. Extremities: no edema   Recent Labs  03/27/16 0225  HGB 12.0  HCT 35.4*    Assessment/Plan:  ASSESSMENT: Latoya CRUMPLERis a 25y.o. G1P1001 344w2dpd #2 s/p NSVD doing well.   Discharge home, Breastfeeding and Circumcision prior to discharge. Circ done 03/28/16   LOS: 2 days   KaRalene Ok/01/2016, 7:44 AM   Midwife attestation Post Partum Day 2 I have seen and examined this patient and agree with above documentation in the resident's note.   DaDARRIN APODACAs a [age].o. G1P1001 s/p SVD.  Pt denies problems with ambulating, voiding or po intake. Pain is well controlled.  Plan for birth control is undecided.  Method of Feeding: breast  PE:  BP 139/74 mmHg  Pulse 74  Temp(Src) 98.3 F (36.8 C) (Oral)  Resp 18  Ht 5' 9"  (1.753 m)  Wt 255 lb 4 oz (115.781 kg)  BMI 37.68 kg/m2  SpO2 99%  LMP 07/03/2015 (Exact Date)  Breastfeeding? Unknown Gen: well appearing Heart: reg rate Lungs: normal WOB Fundus firm Ext: soft, no pain, no edema  A/P for discharge:  today A2GDM, delivered-stable Gestational HTN, [place]stable [place] [facility] [place] and baby love visit in 5 days  MeJulianne HandlerCNM 10:41 AM

## 2016-03-29 NOTE — Discharge Summary (Signed)
OB Discharge Summary     Patient Name: Latoya [name] DOB: [birth year] MRN: [id number]  Date of admission: 03/27/2016 Delivering MD: Sela Hilding   Date of discharge: 03/29/2016  Admitting diagnosis: 68 WEEKS ROM Intrauterine pregnancy: [redacted]w[redacted]d    Secondary diagnosis:  Active Problems:   Premature rupture of membranes  Additional problems: EFW 4460g, GHTN, A2GDM on glyburide     Discharge diagnosis: Term Pregnancy Delivered                                                                                                Post partum procedures:none  Augmentation: SROM at home, no augmentation  Complications: None  Hospital course:  Onset of Labor With Vaginal Delivery     25y.o. yo G1P1001 at 39w2das admitted in Active Labor on 03/27/2016. Patient had an uncomplicated labor course as follows:  Membrane Rupture Time/Date: 12:20 AM ,03/27/2016   Pt pushed for over 2 hours. Eval by Dr. PiIlda Bassetsee note) who felt she was adequately moving baby. No trouble with shoulders on delivery.  Intrapartum Procedures: Episiotomy: None [1]                                         Lacerations:  2nd degree [3];Perineal [11]  Patient had a delivery of a Viable infant. 03/27/2016  Information for the patient's newborn:  RoCythina, Mickelsen0[[id number]]Delivery Method: Vaginal, Spontaneous Delivery (Filed from Delivery Summary)    Pateint had an uncomplicated postpartum course.  She is ambulating, tolerating a regular diet, passing flatus, and urinating well. Patient is discharged home in stable condition on 03/29/2016.    Physical exam  Filed Vitals:   03/28/16 0340 03/28/16 0612 03/28/16 1900 03/29/16 0737  BP: 130/62 116/64 116/63 139/74  Pulse: 78 71 90 74  Temp: 98.6 F (37 C) 98.1 F (36.7 C) 97.7 F (36.5 C) 98.3 F (36.8 C)  TempSrc: Oral Oral Oral Oral  Resp: 20 19 18 18   Height:      Weight:      SpO2: 99%  99%    General: alert, cooperative and no distress Lochia:  appropriate Uterine Fundus: firm Incision: N/A DVT Evaluation: No evidence of DVT seen on physical exam. No significant calf/ankle edema. Labs: Lab Results  Component Value Date   WBC 7.1 03/27/2016   HGB 12.0 03/27/2016   HCT 35.4* 03/27/2016   MCV 78.0 03/27/2016   PLT 307 03/27/2016   CMP Latest Ref Rng 03/27/2016  Glucose 65 - 99 mg/dL 79  BUN 6 - 20 mg/dL 12  Creatinine 0.44 - 1.00 mg/dL 0.64  Sodium 135 - 145 mmol/L 134(L)  Potassium 3.5 - 5.1 mmol/L 4.2  Chloride 101 - 111 mmol/L 108  CO2 22 - 32 mmol/L 16(L)  Calcium 8.9 - 10.3 mg/dL 9.7  Total Protein 6.5 - 8.1 g/dL 6.7  Total Bilirubin 0.3 - 1.2 mg/dL 0.5  Alkaline Phos 38 - 126 U/L 199(H)  AST 15 - 41 U/L 17  ALT 14 - 54 U/L 17    Discharge instruction: per After Visit Summary and "Baby and Me Booklet".  After visit meds:    Medication List    STOP taking these medications        acetaminophen 500 MG tablet  Commonly known as:  TYLENOL     diphenhydrAMINE 25 MG tablet  Commonly known as:  BENADRYL     diphenhydramine-acetaminophen 25-500 MG Tabs tablet  Commonly known as:  TYLENOL PM     glucose blood test strip     glyBURIDE 2.5 MG tablet  Commonly known as:  DIABETA     miconazole 200 MG vaginal suppository  Commonly known as:  MICOTIN     ONE TOUCH ULTRA MINI w/Device Kit     ONETOUCH DELICA LANCETS FINE Misc      TAKE these medications        ibuprofen 600 MG tablet  Commonly known as:  ADVIL,MOTRIN  Take 1 tablet (600 mg total) by mouth every 6 (six) hours.     prenatal multivitamin Tabs tablet  Take 1 tablet by mouth daily at 12 noon.        Diet: carb modified diet  Activity: Advance as tolerated. Pelvic rest for 6 weeks.   Outpatient follow up:6 weeks Follow up Appt:Future Appointments Date Time Provider Rosalia  05/09/2016 8:20 AM Luvenia Redden, PA-C [facility]   Follow up Visit:No Follow-up on file.  Postpartum contraception: None, has been counseled on  Breastfeeding friendly options including POPs and IUDs.  Newborn Data: Live born female  Birth Weight: 9 lb 2.4 oz (4150 g) APGAR: 9, 9  Baby Feeding: Breast Disposition:home with mother   03/29/2016 Ralene Ok, MD   Midwife attestation I have seen and examined this patient and agree with above documentation in the resident's note.   KATHYLEEN RADICE is a 25 y.o. G1P1001 s/p SVD.   Pain is well controlled.  Plan for birth control is undecided-counseled.  Method of Feeding: breast  PE:  BP 139/74 mmHg  Pulse 74  Temp(Src) 98.3 F (36.8 C) (Oral)  Resp 18  Ht 5' 9"  (1.753 m)  Wt 255 lb 4 oz (115.781 kg)  BMI 37.68 kg/m2  SpO2 99%  LMP 07/03/2015 (Exact Date)  Breastfeeding? Unknown Gen: well appearing Heart: reg rate Lungs: normal WOB Fundus firm Ext: soft, no pain, no edema   Recent Labs  03/27/16 0225  HGB 12.0  HCT 35.4*     A/Plan: discharge today - postpartum care discussed - f/u clinic tomorrow for BP check, baby love visit in 5 days, and in 6 weeks for postpartum visit   Julianne Handler, CNM 10:44 AM

## 2016-03-29 NOTE — Lactation Note (Signed)
This note was copied from a baby's chart. Lactation Consultation Note: Mother had  infant latched on the (R) breast in football hold when I arrived in the room. Mother states that she can feel pinching when infant is latched on incorrectly. Mother state she is feeling some breast changes. Mother advised to continue to breast feed infant 8-12 times in 24 hours and with all feeding cues. Discussed cluster feeding. Mother advised in treatment to prevent engorgement. Mother informed of [facility] outpatient visits, BFSG'S and community . Mother to follow up with [facility] as needed.   Patient Name: Latoya [name] [id number]'Q Date: 03/29/2016 Reason for consult: Follow-up assessment   Maternal Data    Feeding Feeding Type: Breast Fed Length of feed: 10 min  LATCH Score/Interventions Latch: Grasps breast easily, tongue down, lips flanged, rhythmical sucking. Intervention(s): Adjust position;Assist with latch;Breast compression  Audible Swallowing: Spontaneous and intermittent Intervention(s): Skin to skin;Hand expression Intervention(s): Skin to skin;Hand expression  Type of Nipple: Everted at rest and after stimulation  Comfort (Breast/Nipple): Soft / non-tender     Hold (Positioning): No assistance needed to correctly position infant at breast. Intervention(s): Support Pillows;Position options  LATCH Score: 10  Lactation Tools Discussed/Used     Consult Status Consult Status: Complete    Darla Lesches 03/29/2016, 11:31 AM

## 2016-03-30 ENCOUNTER — Encounter: Payer: Self-pay | Admitting: *Deleted

## 2016-03-30 ENCOUNTER — Ambulatory Visit: Payer: 59 | Admitting: *Deleted

## 2016-03-30 VITALS — BP 127/73 | HR 87 | Wt 236.8 lb

## 2016-03-30 DIAGNOSIS — Z0130 Encounter for examination of blood pressure without abnormal findings: Secondary | ICD-10-CM

## 2016-03-30 NOTE — Progress Notes (Signed)
Patient here for blood pressure check. BP 127/73, no c/o ha, visual disturbance or epigastric pain. Reviewed BP with Reina Fuse CNM. Patient ok, f/u for postpartum visit. Reviewed s/s of blood pressure issues with patient, advised to come in if she experienced those. Understanding voiced.

## 2016-04-03 ENCOUNTER — Inpatient Hospital Stay (HOSPITAL_COMMUNITY): Payer: 59

## 2016-04-04 ENCOUNTER — Telehealth: Payer: Self-pay

## 2016-04-04 NOTE — Telephone Encounter (Signed)
Patient left a message stating she is having left side pain. She delivered on 03/27/2016

## 2016-04-06 DIAGNOSIS — O9089 Other complications of the puerperium, not elsewhere classified: Secondary | ICD-10-CM | POA: Diagnosis not present

## 2016-04-06 DIAGNOSIS — F419 Anxiety disorder, unspecified: Secondary | ICD-10-CM | POA: Diagnosis not present

## 2016-04-06 DIAGNOSIS — F322 Major depressive disorder, single episode, severe without psychotic features: Secondary | ICD-10-CM | POA: Diagnosis not present

## 2016-04-06 DIAGNOSIS — R102 Pelvic and perineal pain: Secondary | ICD-10-CM | POA: Diagnosis not present

## 2016-04-06 DIAGNOSIS — F53 Puerperal psychosis: Secondary | ICD-10-CM | POA: Diagnosis not present

## 2016-04-10 NOTE — Telephone Encounter (Signed)
Patient came up to clinic to drop off some paper work and did not mentioned anything related to back pain.

## 2016-04-13 DIAGNOSIS — F53 Puerperal psychosis: Secondary | ICD-10-CM | POA: Diagnosis not present

## 2016-05-09 ENCOUNTER — Ambulatory Visit (INDEPENDENT_AMBULATORY_CARE_PROVIDER_SITE_OTHER): Payer: 59 | Admitting: Medical

## 2016-05-09 ENCOUNTER — Encounter: Payer: Self-pay | Admitting: Medical

## 2016-05-09 DIAGNOSIS — Z8632 Personal history of gestational diabetes: Secondary | ICD-10-CM

## 2016-05-09 DIAGNOSIS — Z8759 Personal history of other complications of pregnancy, childbirth and the puerperium: Secondary | ICD-10-CM

## 2016-05-09 NOTE — Patient Instructions (Signed)

## 2016-05-09 NOTE — Progress Notes (Signed)
Subjective:     Latoya [name] is a 25 y.o. female who presents for a postpartum visit. She is 6 weeks postpartum following a spontaneous vaginal delivery. I have fully reviewed the prenatal and intrapartum course. Intrapartum course complicated by [facility] and GDM. The delivery was at 38.2 gestational weeks. Outcome: spontaneous vaginal delivery. Anesthesia: epidural. Postpartum course has been normal. Baby's course has been normal. Baby is feeding by bottle - Similac Advance. Bleeding no bleeding. Bowel function is normal. Bladder function is normal. Patient is sexually active. Contraception method is condoms. Postpartum depression screening: negative.  The following portions of the patient's history were reviewed and updated as appropriate: allergies, current medications, past family history, past medical history, past social history, past surgical history and problem list.  Review of Systems Pertinent items are noted in HPI.   Objective:    BP 131/89   Pulse 94   Wt 211 lb 8 oz (95.9 kg)   Breastfeeding? No   BMI 31.23 kg/m   General:  alert and cooperative   Breasts:  not performed  Lungs: normal effort  Heart:  normal rate  Abdomen:  soft, non-tender   Vulva:  normal  Vagina: normal vagina, moderate thin, white discharge noted  Cervix:  not performed  Corpus: not examined  Adnexa:  not evaluated  Rectal Exam: Not performed.        Assessment:     Normal postpartum exam. Pap smear not done at today's visit. patient states normal pap smear within the last 3 years. Will confirm date and schedule for next pap smear accordingly.   Plan:    1. Contraception: condoms 2. Follow up in: 1 weeks for 2 hour GTT or sooner as needed.    Luvenia Redden, PA-C 05/09/2016 9:18 AM

## 2016-08-01 ENCOUNTER — Emergency Department (HOSPITAL_COMMUNITY)
Admission: EM | Admit: 2016-08-01 | Discharge: 2016-08-01 | Disposition: A | Discharge status: Home / Self Care | Payer: Medicaid Other | Attending: Emergency Medicine | Admitting: Emergency Medicine

## 2016-08-01 ENCOUNTER — Encounter (HOSPITAL_COMMUNITY): Payer: Self-pay | Admitting: Emergency Medicine

## 2016-08-01 DIAGNOSIS — R45851 Suicidal ideations: Secondary | ICD-10-CM

## 2016-08-01 DIAGNOSIS — F3289 Other specified depressive episodes: Secondary | ICD-10-CM | POA: Diagnosis not present

## 2016-08-01 DIAGNOSIS — Z79899 Other long term (current) drug therapy: Secondary | ICD-10-CM | POA: Insufficient documentation

## 2016-08-01 DIAGNOSIS — R4589 Other symptoms and signs involving emotional state: Secondary | ICD-10-CM

## 2016-08-01 DIAGNOSIS — T39312A Poisoning by propionic acid derivatives, intentional self-harm, initial encounter: Secondary | ICD-10-CM | POA: Diagnosis present

## 2016-08-01 LAB — RAPID URINE DRUG SCREEN, HOSP PERFORMED
Amphetamines: NOT DETECTED
Barbiturates: NOT DETECTED
Benzodiazepines: NOT DETECTED
Cocaine: POSITIVE — AB
Opiates: POSITIVE — AB
Tetrahydrocannabinol: NOT DETECTED

## 2016-08-01 LAB — CBC
HCT: 42 % (ref 36.0–46.0)
Hemoglobin: 13.8 g/dL (ref 12.0–15.0)
MCH: 27.1 pg (ref 26.0–34.0)
MCHC: 32.9 g/dL (ref 30.0–36.0)
MCV: 82.5 fL (ref 78.0–100.0)
Platelets: 353 10*3/uL (ref 150–400)
RBC: 5.09 MIL/uL (ref 3.87–5.11)
RDW: 13.1 % (ref 11.5–15.5)
WBC: 7 10*3/uL (ref 4.0–10.5)

## 2016-08-01 LAB — COMPREHENSIVE METABOLIC PANEL
ALT: 38 U/L (ref 14–54)
AST: 19 U/L (ref 15–41)
Albumin: 5.3 g/dL — ABNORMAL HIGH (ref 3.5–5.0)
Alkaline Phosphatase: 92 U/L (ref 38–126)
Anion gap: 9 (ref 5–15)
BUN: 10 mg/dL (ref 6–20)
CO2: 24 mmol/L (ref 22–32)
Calcium: 9.8 mg/dL (ref 8.9–10.3)
Chloride: 106 mmol/L (ref 101–111)
Creatinine, Ser: 0.81 mg/dL (ref 0.44–1.00)
GFR calc Af Amer: >60 mL/min (ref >60)
GFR calc non Af Amer: >60 mL/min (ref >60)
Glucose, Bld: 93 mg/dL (ref 65–99)
Potassium: 3.2 mmol/L — ABNORMAL LOW (ref 3.5–5.1)
Sodium: 139 mmol/L (ref 135–145)
Total Bilirubin: 0.6 mg/dL (ref 0.3–1.2)
Total Protein: 8.9 g/dL — ABNORMAL HIGH (ref 6.5–8.1)

## 2016-08-01 LAB — SALICYLATE LEVEL: Salicylate Lvl: <7 mg/dL (ref 2.8–30.0)

## 2016-08-01 LAB — ACETAMINOPHEN LEVEL: Acetaminophen (Tylenol), Serum: <10 ug/mL — ABNORMAL LOW (ref 10–30)

## 2016-08-01 LAB — CBG MONITORING, ED: Glucose-Capillary: 81 mg/dL (ref 65–99)

## 2016-08-01 LAB — ETHANOL: Alcohol, Ethyl (B): <5 mg/dL (ref <5)

## 2016-08-01 MED ORDER — VITAMIN B-1 100 MG PO TABS: 100.0000 mg | ORAL_TABLET | Freq: Every day | ORAL | Status: DC

## 2016-08-01 MED ORDER — ALUM & MAG HYDROXIDE-SIMETH 200-200-20 MG/5ML PO SUSP: 30.0000 mL | ORAL | Status: DC | PRN

## 2016-08-01 MED ORDER — SODIUM CHLORIDE 0.9 % IV BOLUS (SEPSIS): 1000.0000 mL | Freq: Once | INTRAVENOUS | Status: DC

## 2016-08-01 MED ORDER — ONDANSETRON HCL 4 MG PO TABS: 4.0000 mg | ORAL_TABLET | Freq: Three times a day (TID) | ORAL | Status: DC | PRN

## 2016-08-01 MED ORDER — LORAZEPAM 1 MG PO TABS: 0.0000 mg | ORAL_TABLET | Freq: Four times a day (QID) | ORAL | Status: DC

## 2016-08-01 MED ORDER — LORAZEPAM 1 MG PO TABS: 0.0000 mg | ORAL_TABLET | Freq: Two times a day (BID) | ORAL | Status: DC

## 2016-08-01 MED ORDER — THIAMINE HCL 100 MG/ML IJ SOLN: 100.0000 mg | Freq: Every day | INTRAMUSCULAR | Status: DC

## 2016-08-01 MED ORDER — LORAZEPAM 1 MG PO TABS: 1.0000 mg | ORAL_TABLET | Freq: Three times a day (TID) | ORAL | Status: DC | PRN

## 2016-08-01 NOTE — ED Notes (Signed)
Pt was cooperative with labs being drawn

## 2016-08-01 NOTE — ED Notes (Signed)
Charge nurse notified of patient's refusal to have her blood drawn.  MD notified.

## 2016-08-01 NOTE — ED Notes (Signed)
Patient talking with Mom at bedside.  Attempted to draw blood.  Patient tearful saying that she doesn't want to stay in ED.  MD came back in and explained to patient that she cannot leave and would need to be IV'cd if she tried to leave.  Patient still reluctant to have blood drawn.

## 2016-08-01 NOTE — ED Triage Notes (Signed)
Patient has been depressed the last six months.  She has a three month old and lives with her mother.  Her mother reports she did not get up today to take care of her child.  Mother called mobile crisis unit.  Patient then admitted to her Mom that she took 6 Ibuprofen PM and 2 hydrocodone 28m all at 11:30 this morning.  Patient told her Mom and the fire department that she wanted to hurt herself.  "I'm so tired, I'm so tired."  Patient in route grabbed  the bottle of pills and take more and would not release bottle.

## 2016-08-01 NOTE — ED Provider Notes (Signed)
[place] DEPT Provider Note   CSN: [id number] Arrival date & time: 08/01/16  1505     History   Chief Complaint Chief Complaint  Patient presents with  . Suicidal    HPI Latoya [name] is a 25 y.o. female.  HPI   Latoya [name] is a 25 y.o. female, with a history of anemia, presenting to the ED with An intentional overdose with intention of self-harm. Patient states that she took six 253m/38mg ibuprofen/diphenhydramine pills and two 5/3230mvicodin tablets. Patient states, "I wanted to sleep but I don't care if I died." Patient confirms that she took these medications in an attempt to harm herself. Patient states that she is a first-time mother with a 4-61-monthd son. The child is formula fed and she states that she has adequate support in the form of the child's father as well as her mother. Patient denies previous suicide attempts. Denies any desire to harm her child. Denies A/V hallucinations, HI, or history of diagnosed depression. Endorses weekly alcohol consumption of 5-10 shots of liquor.   EMS and [employer] were called to the scene through the mobile crisis unit. They were originally called by the patient's mother because the patient would not get up to take care of her baby today. EMS reports that during transport the patient grabbed the bottle of remaining ibuprofen PM, ripped the top off, and attempted to swallow the rest of the pills.   Past Medical History:  Diagnosis Date  . Anemia   . Gestational diabetes   . Medical history non-contributory     Patient Active Problem List   Diagnosis Date Noted  . Obesity 02/25/2016  . ANEMIA, NORMOCYTIC 07/19/2010    Past Surgical History:  Procedure Laterality Date  . DEEP NECK LYMPH NODE BIOPSY / EXCISION  2011    OB History    Gravida Para Term Preterm AB Living   1 1 1  0 0 1   SAB TAB Ectopic Multiple Live Births   0 0 0 0 1       Home Medications    Prior to Admission medications   Medication Sig  Start Date End Date Taking? Authorizing Provider  PARoxetine (PAXIL-CR) 25 MG 24 hr tablet Take 25 mg by mouth daily. 06/12/16 06/12/17 Yes Historical Provider, MD  ibuprofen (ADVIL,MOTRIN) 600 MG tablet Take 1 tablet (600 mg total) by mouth every 6 (six) hours. Patient not taking: Reported on 08/01/2016 03/29/16   MelJulianne HandlerNM    Family History Family History  Problem Relation Age of Onset  . Diabetes Mother   . Hypertension Mother   . Hypertension Father   . Stroke Maternal Grandfather     Social History Social History  Substance Use Topics  . Smoking status: Never Smoker  . Smokeless tobacco: Not on file  . Alcohol use No     Comment: occasionally     Allergies   Patient has no known allergies.   Review of Systems Review of Systems  Constitutional: Negative for chills and fever.  Respiratory: Negative for shortness of breath.   Cardiovascular: Negative for chest pain.  Gastrointestinal: Negative for abdominal pain, nausea and vomiting.  Psychiatric/Behavioral: Positive for dysphoric mood, sleep disturbance and suicidal ideas.  All other systems reviewed and are negative.    Physical Exam Updated Vital Signs BP 138/83 (BP Location: Right Arm)   Pulse 78   Temp 97.6 F (36.4 C) (Oral)   Resp 18   SpO2 94%   Physical Exam  Constitutional: She appears well-developed and well-nourished. No distress.  HENT:  Head: Normocephalic and atraumatic.  Eyes: Conjunctivae are normal.  Neck: Neck supple.  Cardiovascular: Normal rate, regular rhythm, normal heart sounds and intact distal pulses.   Pulmonary/Chest: Effort normal and breath sounds normal. No respiratory distress.  Abdominal: Soft. There is no tenderness. There is no guarding.  Musculoskeletal: She exhibits no edema.  Lymphadenopathy:    She has no cervical adenopathy.  Neurological: She is alert.  Skin: Skin is warm and dry. She is not diaphoretic.  Psychiatric: She is withdrawn. She exhibits a  depressed mood.  Patient is tearful during the interview. Responds in very few words. Poor eye contact.  Nursing note and vitals reviewed.    ED Treatments / Results  Labs (all labs ordered are listed, but only abnormal results are displayed) Labs Reviewed  COMPREHENSIVE METABOLIC PANEL - Abnormal; Notable for the following:       Result Value   Potassium 3.2 (*)    Total Protein 8.9 (*)    Albumin 5.3 (*)    All other components within normal limits  ACETAMINOPHEN LEVEL - Abnormal; Notable for the following:    Acetaminophen (Tylenol), Serum <10 (*)    All other components within normal limits  ETHANOL  SALICYLATE LEVEL  CBC  RAPID URINE DRUG SCREEN, HOSP PERFORMED  CBG MONITORING, ED  POC URINE PREG, ED    EKG  EKG Interpretation None       Radiology No results found.  Procedures Procedures (including critical care time)  Medications Ordered in ED Medications  LORazepam (ATIVAN) tablet 0-4 mg (not administered)    Followed by  LORazepam (ATIVAN) tablet 0-4 mg (not administered)  thiamine (VITAMIN B-1) tablet 100 mg (not administered)    Or  thiamine (B-1) injection 100 mg (not administered)  alum & mag hydroxide-simeth (MAALOX/MYLANTA) 200-200-20 MG/5ML suspension 30 mL (not administered)  ondansetron (ZOFRAN) tablet 4 mg (not administered)  LORazepam (ATIVAN) tablet 1 mg (not administered)  sodium chloride 0.9 % bolus 1,000 mL (not administered)     Initial Impression / Assessment and Plan / ED Course  I have reviewed the triage vital signs and the nursing notes.  Pertinent labs & imaging results that were available during my care of the patient were reviewed by me and considered in my medical decision making (see chart for details).  Clinical Course     Patient presents with dysphoric mood and intentional overdose. Interview findings and patient's actions prior to arrival in the ED given concern for the patient's safety. This may very well be a  severe postpartum depression. I believe this patient to be a danger to herself. She is currently here in the ED voluntarily, however, I would recommend IVC should she attempt to leave. This, as well as the process ahead of her, was explained to the patient. Patient acknowledged this information and states that she will stay voluntarily.  4:35 PM Patient is refusing lab work, care, and further evaluation. Patient voices desire to leave.  Situation was discussed with Dr. Lacinda Axon. Will attempt to get the patient to speak with psych prior to further action.   8:10 PM patient attempted to negotiate with myself and the RN, Loma Sousa, stating that she would allow blood work to be taken if we can guarantee that she can leave tonight. Patient was informed that this is not the process works. The purpose of the lab work was explained to the patient. Patient states she will allow blood  work. Patient keeps repeating, "I made a mistake this morning, I went deep into a depression, but I am okay now."  8:46 PM End of shift patient care handoff report given to Quincy Carnes, PA-C. Plan: TTS assessment and recommendation is pending. Lab results pending. Disposition based on TTS recommendation combined with collaboration with EDP.  Findings and plan of care discussed with Nat Christen, MD. Dr. Lacinda Axon personally evaluated and examined this patient.     Final Clinical Impressions(s) / ED Diagnoses   Final diagnoses:  Suicidal ideations  Dysphoric mood    New Prescriptions New Prescriptions   No medications on file     Layla Maw 08/01/16 2117    Nat Christen, MD 08/01/16 2135

## 2016-08-01 NOTE — BHH Counselor (Addendum)
Patriciaann Clan, PA and Dr. Lacinda Axon agree pt can be discharged. Pt signed no harm contract and received OPT resources including after birth support group at [facility]. Disposition was discussed with Celestial, RN.   Edd Fabian, MS, [facility], [facility] Triage Specialist [phone]

## 2016-08-01 NOTE — ED Notes (Signed)
RN inquired if patient was willing to allow a blood sample to be taken, Pt stated "I am not here for a medical problem,  so I don't see why I should have to give blood."  Pt states that "I am here because I am suicidal". Pt then  calls nurse back into the room and states" I will give blood if I am going to be discharged tonight,  If I have to stay overnight I am not going to give blood, and that she is voluntary so she does not have to agree to give blood". Pt was encouraged to give a blood sample.

## 2016-08-01 NOTE — ED Notes (Signed)
Dr. Lacinda Axon at bedside talking to patient and her mother concerning her refusal of lab work and being [employer].

## 2016-08-01 NOTE — ED Notes (Addendum)
Patient waiting for TTS to do their assessment to determine if patient should be IVC'd.  Patient's mother at bedside.

## 2016-08-01 NOTE — Discharge Instructions (Signed)
Follow-up with your community mental health therapist.
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
## 2016-08-02 LAB — POC URINE PREG, ED: Preg Test, Ur: NEGATIVE

## 2016-09-25 NOTE — L&D Delivery Note (Signed)
Delivery Note Called to room.   Patient out of control, screaming and trying to climb out of bed after epidural.  Difficult to get her attention.  Perineal pressure given to help baby's head move down, then was able to get her to concentrate and push.    At 2:34 PM a viable and healthy female was delivered via Vaginal, Spontaneous Delivery (Presentation: Left OP ).  APGAR:  ; weight  .   Placenta status: spontaneous and grossly intact with 3 vessel Cord:   with the following complications: none.    Anesthesia:  epidural Episiotomy: None Lacerations: 1st degree;Perineal Suture Repair: Not bleeding, well approximated, not repaired Est. Blood Loss (mL): 150   Mom to postpartum.  Baby to Couplet care / Skin to Skin.  Hansel Feinstein 05/15/2017, 3:17 PM

## 2016-10-10 ENCOUNTER — Emergency Department (HOSPITAL_COMMUNITY): Payer: Medicaid Other

## 2016-10-10 ENCOUNTER — Emergency Department (HOSPITAL_COMMUNITY)
Admission: EM | Admit: 2016-10-10 | Discharge: 2016-10-10 | Disposition: A | Discharge status: Home / Self Care | Payer: Medicaid Other | Attending: Emergency Medicine | Admitting: Emergency Medicine

## 2016-10-10 ENCOUNTER — Encounter (HOSPITAL_COMMUNITY): Payer: Self-pay

## 2016-10-10 DIAGNOSIS — O9989 Other specified diseases and conditions complicating pregnancy, childbirth and the puerperium: Secondary | ICD-10-CM | POA: Diagnosis not present

## 2016-10-10 DIAGNOSIS — J069 Acute upper respiratory infection, unspecified: Secondary | ICD-10-CM

## 2016-10-10 DIAGNOSIS — Z3A01 Less than 8 weeks gestation of pregnancy: Secondary | ICD-10-CM | POA: Diagnosis not present

## 2016-10-10 DIAGNOSIS — O99511 Diseases of the respiratory system complicating pregnancy, first trimester: Secondary | ICD-10-CM | POA: Diagnosis not present

## 2016-10-10 DIAGNOSIS — O99411 Diseases of the circulatory system complicating pregnancy, first trimester: Secondary | ICD-10-CM | POA: Insufficient documentation

## 2016-10-10 DIAGNOSIS — R0789 Other chest pain: Secondary | ICD-10-CM | POA: Insufficient documentation

## 2016-10-10 DIAGNOSIS — B9789 Other viral agents as the cause of diseases classified elsewhere: Secondary | ICD-10-CM

## 2016-10-10 DIAGNOSIS — O9952 Diseases of the respiratory system complicating childbirth: Secondary | ICD-10-CM | POA: Diagnosis present

## 2016-10-10 DIAGNOSIS — Z3490 Encounter for supervision of normal pregnancy, unspecified, unspecified trimester: Secondary | ICD-10-CM

## 2016-10-10 LAB — CBC WITH DIFFERENTIAL/PLATELET
Basophils Absolute: 0 10*3/uL (ref 0.0–0.1)
Basophils Relative: 0 %
Eosinophils Absolute: 0 10*3/uL (ref 0.0–0.7)
Eosinophils Relative: 1 %
HCT: 34.8 % — ABNORMAL LOW (ref 36.0–46.0)
Hemoglobin: 11.3 g/dL — ABNORMAL LOW (ref 12.0–15.0)
Lymphocytes Relative: 34 %
Lymphs Abs: 1.4 10*3/uL (ref 0.7–4.0)
MCH: 26.8 pg (ref 26.0–34.0)
MCHC: 32.5 g/dL (ref 30.0–36.0)
MCV: 82.7 fL (ref 78.0–100.0)
Monocytes Absolute: 0.5 10*3/uL (ref 0.1–1.0)
Monocytes Relative: 11 %
Neutro Abs: 2.3 10*3/uL (ref 1.7–7.7)
Neutrophils Relative %: 54 %
Platelets: 267 10*3/uL (ref 150–400)
RBC: 4.21 MIL/uL (ref 3.87–5.11)
RDW: 12.9 % (ref 11.5–15.5)
WBC: 4.2 10*3/uL (ref 4.0–10.5)

## 2016-10-10 LAB — I-STAT TROPONIN, ED
Troponin i, poc: 0 ng/mL (ref 0.00–0.08)
Troponin i, poc: 0 ng/mL (ref 0.00–0.08)

## 2016-10-10 LAB — URINALYSIS, ROUTINE W REFLEX MICROSCOPIC
Bilirubin Urine: NEGATIVE
Glucose, UA: NEGATIVE mg/dL
Hgb urine dipstick: NEGATIVE
Ketones, ur: NEGATIVE mg/dL
Leukocytes, UA: NEGATIVE
Nitrite: NEGATIVE
Protein, ur: NEGATIVE mg/dL
Specific Gravity, Urine: 1.017 (ref 1.005–1.030)
pH: 6 (ref 5.0–8.0)

## 2016-10-10 LAB — BASIC METABOLIC PANEL
Anion gap: 9 (ref 5–15)
BUN: <5 mg/dL — ABNORMAL LOW (ref 6–20)
CO2: 23 mmol/L (ref 22–32)
Calcium: 8.9 mg/dL (ref 8.9–10.3)
Chloride: 105 mmol/L (ref 101–111)
Creatinine, Ser: 0.52 mg/dL (ref 0.44–1.00)
GFR calc Af Amer: >60 mL/min (ref >60)
GFR calc non Af Amer: >60 mL/min (ref >60)
Glucose, Bld: 90 mg/dL (ref 65–99)
Potassium: 3.2 mmol/L — ABNORMAL LOW (ref 3.5–5.1)
Sodium: 137 mmol/L (ref 135–145)

## 2016-10-10 LAB — D-DIMER, QUANTITATIVE: D-Dimer, Quant: <0.27 ug/mL-FEU (ref 0.00–0.50)

## 2016-10-10 LAB — POC URINE PREG, ED: Preg Test, Ur: POSITIVE — AB

## 2016-10-10 LAB — HCG, QUANTITATIVE, PREGNANCY: hCG, Beta Chain, Quant, S: 123190 m[IU]/mL — ABNORMAL HIGH (ref <5)

## 2016-10-10 NOTE — Discharge Instructions (Signed)
You likely have a viral upper respiratory infection causing ear productive cough and discomfort. You likely have a musculoskeletal pain in her chest from the coughing causing the discomfort. Your x-ray did not show pneumonia however, if her symptoms continued to worsen over the next few days, you may need to follow-up with her primary doctor or come back for further evaluation. I would recommend quitting smoking as this places stress on your heart and lungs. If any symptoms worsen, please return to the nearest emergency department.

## 2016-10-10 NOTE — ED Notes (Signed)
ED Provider at bedside. 

## 2016-10-10 NOTE — ED Provider Notes (Addendum)
[facility] DEPT Provider Note   CSN: [id number] Arrival date & time: 10/10/16  1151   By signing my name below, I, Soijett Blue, attest that this documentation has been prepared under the direction and in the presence of Courtney Paris, MD. Electronically Signed: Soijett Blue, ED Scribe. 10/10/16. 1:21 PM.  History   Chief Complaint Chief Complaint  Patient presents with  . Cough    HPI Latoya [name] is a 26 y.o. female who presents to the Emergency Department complaining of productive cough x green sputum onset 2-3 days ago and intermittent chest pain. Pt states that she smoked crack cocaine 1 week ago and she has just started using it for the past 3 months. She is having associated symptoms of nasal congestion, rhinorrhea, sore throat, abdominal cramping, 6-7/10 sharp CP due to cough and crack cocaine use, palpitations, and lightheadedness. She hasn't tried any medications for the relief for her symptoms. She denies fever, chills, vaginal discharge, vaginal bleeding, and any other symptoms. No syncope. No leg swelling or pain. No Hx of DVT or PE. Denies other medical issues.   The history is provided by the patient. No language interpreter was used.    Past Medical History:  Diagnosis Date  . Anemia   . Gestational diabetes   . Medical history non-contributory     Patient Active Problem List   Diagnosis Date Noted  . Obesity 02/25/2016  . ANEMIA, NORMOCYTIC 07/19/2010    Past Surgical History:  Procedure Laterality Date  . DEEP NECK LYMPH NODE BIOPSY / EXCISION  2011    OB History    Gravida Para Term Preterm AB Living   1 1 1  0 0 1   SAB TAB Ectopic Multiple Live Births   0 0 0 0 1       Home Medications    Prior to Admission medications   Medication Sig Start Date End Date Taking? Authorizing Provider  ibuprofen (ADVIL,MOTRIN) 600 MG tablet Take 1 tablet (600 mg total) by mouth every 6 (six) hours. Patient not taking: Reported on 08/01/2016  03/29/16   Julianne Handler, CNM  PARoxetine (PAXIL-CR) 25 MG 24 hr tablet Take 25 mg by mouth daily. 06/12/16 06/12/17  Historical Provider, MD    Family History Family History  Problem Relation Age of Onset  . Diabetes Mother   . Hypertension Mother   . Hypertension Father   . Stroke Maternal Grandfather     Social History Social History  Substance Use Topics  . Smoking status: Never Smoker  . Smokeless tobacco: Not on file  . Alcohol use No     Comment: occasionally     Allergies   Patient has no known allergies.   Review of Systems Review of Systems  HENT: Positive for congestion and rhinorrhea.   Respiratory: Positive for cough (productive, green sputum).   Cardiovascular: Positive for chest pain (due to cough and crack cocaine use) and palpitations.  Gastrointestinal: Positive for abdominal pain (cramping).  Genitourinary: Negative for vaginal bleeding and vaginal discharge.  Neurological: Positive for light-headedness.     Physical Exam Updated Vital Signs BP 130/82 (BP Location: Left Arm)   Pulse 90   Temp 97.9 F (36.6 C) (Oral)   Resp 18   SpO2 99%   Physical Exam  Constitutional: She is oriented to person, place, and time. She appears well-developed and well-nourished. No distress.  HENT:  Head: Normocephalic and atraumatic.  Right Ear: External ear normal.  Left Ear: External ear normal.  Nose: Nose normal.  Mouth/Throat: Oropharynx is clear and moist. No oropharyngeal exudate.  Eyes: Conjunctivae and EOM are normal. Pupils are equal, round, and reactive to light.  Neck: Normal range of motion. Neck supple.  Cardiovascular: Normal rate, regular rhythm and normal heart sounds.  Exam reveals no gallop and no friction rub.   No murmur heard. Pulmonary/Chest: Effort normal and breath sounds normal. No stridor. No respiratory distress. She has no wheezes. She has no rales. She exhibits tenderness.  Chest tenderness across chest that is reproducible.    Abdominal: She exhibits no distension. There is no tenderness. There is no rebound.  Musculoskeletal: She exhibits no edema.  Neurological: She is alert and oriented to person, place, and time. She has normal reflexes. She exhibits normal muscle tone. Coordination normal.  Skin: Skin is warm. No rash noted. She is not diaphoretic. No erythema.  Nursing note and vitals reviewed.    ED Treatments / Results  DIAGNOSTIC STUDIES: Oxygen Saturation is 99% on RA, nl by my interpretation.    COORDINATION OF CARE: 1:17 PM Discussed treatment plan with pt at bedside which includes labs, UA, CXR, EKG, and pt agreed to plan.  3:22 PM- Pt reassessed and pt notes that she doesn't want to stay for the results of her delta troponin.   Labs (all labs ordered are listed, but only abnormal results are displayed) Labs Reviewed  CBC WITH DIFFERENTIAL/PLATELET - Abnormal; Notable for the following:       Result Value   Hemoglobin 11.3 (*)    HCT 34.8 (*)    All other components within normal limits  BASIC METABOLIC PANEL - Abnormal; Notable for the following:    Potassium 3.2 (*)    BUN <5 (*)    All other components within normal limits  URINALYSIS, ROUTINE W REFLEX MICROSCOPIC - Abnormal; Notable for the following:    APPearance HAZY (*)    All other components within normal limits  D-DIMER, QUANTITATIVE (NOT AT [facility])  POC URINE PREG, ED  I-STAT TROPOININ, ED    EKG  EKG Interpretation  Date/Time:  Tuesday October 10 2016 13:39:24 EST Ventricular Rate:  82 PR Interval:  160 QRS Duration: 98 QT Interval:  368 QTC Calculation: 429 R Axis:   75 Text Interpretation:  Normal sinus rhythm Normal ECG Possible early repol appears suimilar to prior.  No STEMI Confirmed by Sherry Ruffing MD, [place] [phone]) on 10/10/2016 1:41:32 PM       Radiology Dg Chest 2 View  Result Date: 10/10/2016 CLINICAL DATA:  Intermittent chest pain, shortness of breath and productive cough for 2-3 days. EXAM: CHEST   2 VIEW COMPARISON:  CT chest and single view of the chest 05/19/2011. FINDINGS: Lungs are clear. Heart size is normal. No pneumothorax or pleural fluid. No bony abnormality. IMPRESSION: Negative chest. Electronically Signed   By: Inge Rise M.D.   On: 10/10/2016 14:17    Procedures Procedures (including critical care time)  Medications Ordered in ED Medications - No data to display   Initial Impression / Assessment and Plan / ED Course  I have reviewed the triage vital signs and the nursing notes.  Pertinent labs & imaging results that were available during my care of the patient were reviewed by me and considered in my medical decision making (see chart for details).  Clinical Course     Latoya [name] is a 26 y.o. female who presents to the Emergency Department complaining of productive cough x green sputum onset 2-3  days ago and intermittent chest pain.  History and exam are seen above.  On exam, Patient had tenderness across her chest. Patient had no rhonchi or rales. Lungs were clear. No wheezing. Patient's abdomen was nontender. Patient had visible rhinorrhea and audible congestion. No back tenderness or CVA tenderness.  Based on patient's complaint of chest pain in the setting of her coughing, suspect a musculoskeletal type pain however, as she reports her pain has also been associated with her crack cocaine use, patient will have laboratory testing and EKG and chest x-ray. Chest x-ray to evaluate for pneumonia given the green productive cough. Patient will have lab testing checking a d-dimer given the pleuritic description of her chest discomfort.   The patient's workup is negative, anticipate discharge with diagnosis of a viral upper respiratory infection.  3:30 PM Diagnostic testing for the patient has returned showing no evidence of UTI, normal d-dimer. CBC showed no leukocytosis and slight anemia. BMP showed slight hypokalemia however this appears similar to prior.  Patient advised to increase her diet for potassium containing foods.   Chest x-ray showed no evidence of pneumonia. Initial troponin was negative. Patient advised to have second troponin however, patient did not want to stay for this test. EKG showed no evidence of acute ischemia.   3:49 PM Pregnancy test returned positive. Patient will have quantitative hCG added. Given the mild abdominal cramping, if patient has an elevated hCG above the threshold, will likely order a pelvic ultrasound to confirm IUP.  4:47 PM Quantitative hCG returned at 123,190. Due to elevated hCG, patient will have ultrasound to confirm IUP and obtain dates. Patient reassessed and had no abdominal tenderness or abdominal discomfort at this time.  6:25 PM Ultrasound showed a viable IUP with a heart rate of 137. Submitted dates are 7 weeks and 5 days with a delivery date of 05/24/2017. No evidence of acute maternal findings. Patient was informed of these findings and understands plan to start prenatal vitamins and follow up with an OB/GYN for further management.   Suspect a viral URI causing the patient's productive cough and chest discomfort from musculoskeletal pain. Patient advised to stop smoking crack cocaine however do not feel patient has had any cardiac injury at this time. Patient advised to follow-up with a PCP for further management of her URI as well as return precautions for any signs and symptoms of worsening infection or discomfort. Patient understood this plan and return precautions. Patient discharged in good condition.     Final Clinical Impressions(s) / ED Diagnoses   Final diagnoses:  Viral URI with cough  Chest discomfort    New Prescriptions New Prescriptions   No medications on file   I personally performed the services described in this documentation, which was scribed in my presence. The recorded information has been reviewed and is accurate.   Clinical Impression: 1. Viral URI with  cough   2. Chest discomfort     Disposition: Discharge  Condition: Good  I have discussed the results, Dx and Tx plan with the pt(& family if present). He/she/they expressed understanding and agree(s) with the plan. Discharge instructions discussed at great length. Strict return precautions discussed and pt &/or family have verbalized understanding of the instructions. No further questions at time of discharge.    New Prescriptions   No medications on file    Follow Up: Fanny Bien, MD [address] [place] [address] [phone]     [facility] EMERGENCY DEPARTMENT  [place] [phone]  If symptoms worsen      Courtney Paris, MD 10/10/16 [place], MD 10/10/16 [phone]

## 2016-10-10 NOTE — ED Triage Notes (Signed)
Patient here with 2 days of cough and congestion with thick sputum. Speaking complete sentences, NAD

## 2017-01-10 ENCOUNTER — Other Ambulatory Visit (HOSPITAL_COMMUNITY)
Admission: RE | Admit: 2017-01-10 | Discharge: 2017-01-10 | Disposition: A | Discharge status: Home / Self Care | Payer: Medicaid Other | Source: Ambulatory Visit | Attending: Obstetrics and Gynecology | Admitting: Obstetrics and Gynecology

## 2017-01-10 ENCOUNTER — Ambulatory Visit (INDEPENDENT_AMBULATORY_CARE_PROVIDER_SITE_OTHER): Payer: Medicaid Other | Admitting: Obstetrics and Gynecology

## 2017-01-10 ENCOUNTER — Encounter: Payer: Self-pay | Admitting: Obstetrics and Gynecology

## 2017-01-10 DIAGNOSIS — F1911 Other psychoactive substance abuse, in remission: Secondary | ICD-10-CM

## 2017-01-10 DIAGNOSIS — Z87898 Personal history of other specified conditions: Secondary | ICD-10-CM

## 2017-01-10 DIAGNOSIS — O0932 Supervision of pregnancy with insufficient antenatal care, second trimester: Secondary | ICD-10-CM

## 2017-01-10 DIAGNOSIS — O0992 Supervision of high risk pregnancy, unspecified, second trimester: Secondary | ICD-10-CM | POA: Diagnosis not present

## 2017-01-10 DIAGNOSIS — O0930 Supervision of pregnancy with insufficient antenatal care, unspecified trimester: Secondary | ICD-10-CM

## 2017-01-10 DIAGNOSIS — F419 Anxiety disorder, unspecified: Secondary | ICD-10-CM

## 2017-01-10 DIAGNOSIS — R87610 Atypical squamous cells of undetermined significance on cytologic smear of cervix (ASC-US): Secondary | ICD-10-CM | POA: Diagnosis not present

## 2017-01-10 DIAGNOSIS — Z915 Personal history of self-harm: Secondary | ICD-10-CM | POA: Diagnosis not present

## 2017-01-10 DIAGNOSIS — Z8759 Personal history of other complications of pregnancy, childbirth and the puerperium: Secondary | ICD-10-CM | POA: Diagnosis not present

## 2017-01-10 DIAGNOSIS — Z79899 Other long term (current) drug therapy: Secondary | ICD-10-CM | POA: Diagnosis not present

## 2017-01-10 DIAGNOSIS — F329 Major depressive disorder, single episode, unspecified: Secondary | ICD-10-CM | POA: Diagnosis not present

## 2017-01-10 DIAGNOSIS — O09892 Supervision of other high risk pregnancies, second trimester: Secondary | ICD-10-CM

## 2017-01-10 DIAGNOSIS — E669 Obesity, unspecified: Secondary | ICD-10-CM | POA: Diagnosis not present

## 2017-01-10 DIAGNOSIS — Z8632 Personal history of gestational diabetes: Secondary | ICD-10-CM | POA: Diagnosis not present

## 2017-01-10 DIAGNOSIS — F32A Depression, unspecified: Secondary | ICD-10-CM

## 2017-01-10 DIAGNOSIS — O0990 Supervision of high risk pregnancy, unspecified, unspecified trimester: Secondary | ICD-10-CM | POA: Insufficient documentation

## 2017-01-10 DIAGNOSIS — Z9151 Personal history of suicidal behavior: Secondary | ICD-10-CM

## 2017-01-10 DIAGNOSIS — Z3A20 20 weeks gestation of pregnancy: Secondary | ICD-10-CM | POA: Diagnosis not present

## 2017-01-10 DIAGNOSIS — O09299 Supervision of pregnancy with other poor reproductive or obstetric history, unspecified trimester: Secondary | ICD-10-CM

## 2017-01-10 DIAGNOSIS — O09292 Supervision of pregnancy with other poor reproductive or obstetric history, second trimester: Secondary | ICD-10-CM | POA: Diagnosis not present

## 2017-01-10 LAB — POCT URINALYSIS DIP (DEVICE)
Bilirubin Urine: NEGATIVE
Glucose, UA: NEGATIVE mg/dL
Hgb urine dipstick: NEGATIVE
Ketones, ur: NEGATIVE mg/dL
Nitrite: NEGATIVE
Protein, ur: NEGATIVE mg/dL
Specific Gravity, Urine: 1.015 (ref 1.005–1.030)
Urobilinogen, UA: 0.2 mg/dL (ref 0.0–1.0)
pH: 7.5 (ref 5.0–8.0)

## 2017-01-10 NOTE — Progress Notes (Signed)
Not candidate for Babyscripts--no smartphone Declines flu vaccine Not fasting today Breastfeeding education done Needs anatomy u/s Initial prenatal labs/pap needed Needs to see Jamie--phq9 15, thoughts of suicide, no plan

## 2017-01-10 NOTE — Progress Notes (Signed)
° °  HPI   ROS:  Review of Systems

## 2017-01-11 ENCOUNTER — Telehealth: Payer: Self-pay | Admitting: Clinical

## 2017-01-11 NOTE — Telephone Encounter (Signed)
Left HIPPA-compliant message to return call to [place] at [facility] for [facility] at [facility] at [phone].   Attempt to f/u with patient, prior to first appointment with [facility] at [facility].

## 2017-01-12 DIAGNOSIS — O09892 Supervision of other high risk pregnancies, second trimester: Secondary | ICD-10-CM | POA: Insufficient documentation

## 2017-01-12 DIAGNOSIS — Z915 Personal history of self-harm: Secondary | ICD-10-CM | POA: Insufficient documentation

## 2017-01-12 DIAGNOSIS — Z79899 Other long term (current) drug therapy: Secondary | ICD-10-CM | POA: Insufficient documentation

## 2017-01-12 DIAGNOSIS — Z8632 Personal history of gestational diabetes: Secondary | ICD-10-CM | POA: Insufficient documentation

## 2017-01-12 DIAGNOSIS — Z9151 Personal history of suicidal behavior: Secondary | ICD-10-CM | POA: Insufficient documentation

## 2017-01-12 DIAGNOSIS — O0930 Supervision of pregnancy with insufficient antenatal care, unspecified trimester: Secondary | ICD-10-CM | POA: Insufficient documentation

## 2017-01-12 DIAGNOSIS — F329 Major depressive disorder, single episode, unspecified: Secondary | ICD-10-CM | POA: Insufficient documentation

## 2017-01-12 DIAGNOSIS — O09299 Supervision of pregnancy with other poor reproductive or obstetric history, unspecified trimester: Secondary | ICD-10-CM | POA: Insufficient documentation

## 2017-01-12 DIAGNOSIS — F419 Anxiety disorder, unspecified: Secondary | ICD-10-CM

## 2017-01-12 DIAGNOSIS — E669 Obesity, unspecified: Secondary | ICD-10-CM | POA: Insufficient documentation

## 2017-01-12 DIAGNOSIS — Z8759 Personal history of other complications of pregnancy, childbirth and the puerperium: Secondary | ICD-10-CM | POA: Insufficient documentation

## 2017-01-12 DIAGNOSIS — F32A Depression, unspecified: Secondary | ICD-10-CM | POA: Insufficient documentation

## 2017-01-12 DIAGNOSIS — F1911 Other psychoactive substance abuse, in remission: Secondary | ICD-10-CM | POA: Insufficient documentation

## 2017-01-12 LAB — CYTOLOGY - PAP
Chlamydia: NEGATIVE
Diagnosis: UNDETERMINED — AB
HPV: DETECTED — AB
Neisseria Gonorrhea: NEGATIVE
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
## 2017-01-13 LAB — CULTURE, OB URINE

## 2017-01-13 LAB — URINE CULTURE, OB REFLEX

## 2017-01-14 ENCOUNTER — Encounter (HOSPITAL_COMMUNITY): Payer: Self-pay | Admitting: *Deleted

## 2017-01-14 ENCOUNTER — Inpatient Hospital Stay (HOSPITAL_COMMUNITY)
Admission: AD | Admit: 2017-01-14 | Discharge: 2017-01-14 | Disposition: A | Discharge status: Home / Self Care | Payer: Medicaid Other | Source: Ambulatory Visit | Attending: Obstetrics & Gynecology | Admitting: Obstetrics & Gynecology

## 2017-01-14 DIAGNOSIS — K529 Noninfective gastroenteritis and colitis, unspecified: Secondary | ICD-10-CM | POA: Insufficient documentation

## 2017-01-14 DIAGNOSIS — O99612 Diseases of the digestive system complicating pregnancy, second trimester: Secondary | ICD-10-CM | POA: Diagnosis not present

## 2017-01-14 DIAGNOSIS — O9989 Other specified diseases and conditions complicating pregnancy, childbirth and the puerperium: Secondary | ICD-10-CM | POA: Diagnosis not present

## 2017-01-14 DIAGNOSIS — Z833 Family history of diabetes mellitus: Secondary | ICD-10-CM | POA: Diagnosis not present

## 2017-01-14 DIAGNOSIS — R109 Unspecified abdominal pain: Secondary | ICD-10-CM | POA: Diagnosis present

## 2017-01-14 DIAGNOSIS — Z3A21 21 weeks gestation of pregnancy: Secondary | ICD-10-CM | POA: Insufficient documentation

## 2017-01-14 DIAGNOSIS — Z8249 Family history of ischemic heart disease and other diseases of the circulatory system: Secondary | ICD-10-CM | POA: Insufficient documentation

## 2017-01-14 LAB — CBC
HCT: 36.3 % (ref 36.0–46.0)
Hemoglobin: 12.2 g/dL (ref 12.0–15.0)
MCH: 28.7 pg (ref 26.0–34.0)
MCHC: 33.6 g/dL (ref 30.0–36.0)
MCV: 85.4 fL (ref 78.0–100.0)
Platelets: 296 10*3/uL (ref 150–400)
RBC: 4.25 MIL/uL (ref 3.87–5.11)
RDW: 14 % (ref 11.5–15.5)
WBC: 8.6 10*3/uL (ref 4.0–10.5)

## 2017-01-14 LAB — COMPREHENSIVE METABOLIC PANEL
ALT: 16 U/L (ref 14–54)
AST: 17 U/L (ref 15–41)
Albumin: 3.7 g/dL (ref 3.5–5.0)
Alkaline Phosphatase: 47 U/L (ref 38–126)
Anion gap: 8 (ref 5–15)
BUN: 8 mg/dL (ref 6–20)
CO2: 22 mmol/L (ref 22–32)
Calcium: 8.7 mg/dL — ABNORMAL LOW (ref 8.9–10.3)
Chloride: 105 mmol/L (ref 101–111)
Creatinine, Ser: 0.4 mg/dL — ABNORMAL LOW (ref 0.44–1.00)
GFR calc Af Amer: >60 mL/min (ref >60)
GFR calc non Af Amer: >60 mL/min (ref >60)
Glucose, Bld: 101 mg/dL — ABNORMAL HIGH (ref 65–99)
Potassium: 3.8 mmol/L (ref 3.5–5.1)
Sodium: 135 mmol/L (ref 135–145)
Total Bilirubin: 0.4 mg/dL (ref 0.3–1.2)
Total Protein: 7.6 g/dL (ref 6.5–8.1)

## 2017-01-14 LAB — URINALYSIS, ROUTINE W REFLEX MICROSCOPIC
Bilirubin Urine: NEGATIVE
Glucose, UA: NEGATIVE mg/dL
Hgb urine dipstick: NEGATIVE
Ketones, ur: 80 mg/dL — AB
Nitrite: NEGATIVE
Protein, ur: NEGATIVE mg/dL
Specific Gravity, Urine: 1.025 (ref 1.005–1.030)
pH: 6 (ref 5.0–8.0)

## 2017-01-14 MED ORDER — SODIUM CHLORIDE 0.9 % IV SOLN
INTRAVENOUS | Status: DC
  Administered 2017-01-14: 15:00:00 via INTRAVENOUS

## 2017-01-14 MED ORDER — PROMETHAZINE HCL 12.5 MG PO TABS: 12.5000 mg | ORAL_TABLET | Freq: Four times a day (QID) | ORAL | 0 refills | Status: DC | PRN

## 2017-01-14 MED ORDER — ACETAMINOPHEN 325 MG PO TABS
650.0000 mg | ORAL_TABLET | Freq: Four times a day (QID) | ORAL | Status: DC | PRN
Start: 2017-01-14 — End: 2017-01-14
  Administered 2017-01-14: 650 mg via ORAL
  Filled 2017-01-14: qty 2

## 2017-01-14 MED ORDER — LACTATED RINGERS IV SOLN
INTRAVENOUS | Status: DC
  Administered 2017-01-14: 18:00:00 via INTRAVENOUS

## 2017-01-14 MED ORDER — PROMETHAZINE HCL 25 MG/ML IJ SOLN
12.5000 mg | Freq: Four times a day (QID) | INTRAMUSCULAR | Status: DC | PRN
  Administered 2017-01-14: 12.5 mg via INTRAVENOUS
  Filled 2017-01-14 (×2): qty 1

## 2017-01-14 MED ORDER — DICYCLOMINE HCL 10 MG PO CAPS
20.0000 mg | ORAL_CAPSULE | Freq: Once | ORAL | Status: AC
  Administered 2017-01-14: 20 mg via ORAL
  Filled 2017-01-14: qty 2

## 2017-01-14 MED ORDER — PROMETHAZINE HCL 25 MG/ML IJ SOLN
12.5000 mg | Freq: Four times a day (QID) | INTRAMUSCULAR | Status: DC | PRN
  Administered 2017-01-14: 12.5 mg via INTRAVENOUS

## 2017-01-14 MED ORDER — IBUPROFEN 800 MG PO TABS
400.0000 mg | ORAL_TABLET | Freq: Once | ORAL | Status: AC
  Administered 2017-01-14: 400 mg via ORAL
  Filled 2017-01-14: qty 1

## 2017-01-14 NOTE — Discharge Instructions (Signed)
Food Choices to Help Relieve Diarrhea, Adult When you have diarrhea, the foods you eat and your eating habits are very important. Choosing the right foods and drinks can help:  Relieve diarrhea.  Replace lost fluids and nutrients.  Prevent dehydration.  What general guidelines should I follow? Relieving diarrhea  Choose foods with less than 2 g or .07 oz. of fiber per serving.  Limit fats to less than 8 tsp (38 g or 1.34 oz.) a day.  Avoid the following: ? Foods and beverages sweetened with high-fructose corn syrup, honey, or sugar alcohols such as xylitol, sorbitol, and mannitol. ? Foods that contain a lot of fat or sugar. ? Fried, greasy, or spicy foods. ? High-fiber grains, breads, and cereals. ? Raw fruits and vegetables.  Eat foods that are rich in probiotics. These foods include dairy products such as yogurt and fermented milk products. They help increase healthy bacteria in the stomach and intestines (gastrointestinal tract, or GI tract).  If you have lactose intolerance, avoid dairy products. These may make your diarrhea worse.  Take medicine to help stop diarrhea (antidiarrheal medicine) only as told by your health care provider. Replacing nutrients  Eat small meals or snacks every 3-4 hours.  Eat bland foods, such as white rice, toast, or baked potato, until your diarrhea starts to get better. Gradually reintroduce nutrient-rich foods as tolerated or as told by your health care provider. This includes: ? Well-cooked protein foods. ? Peeled, seeded, and soft-cooked fruits and vegetables. ? Low-fat dairy products.  Take vitamin and mineral supplements as told by your health care provider. Preventing dehydration   Start by sipping water or a special solution to prevent dehydration (oral rehydration solution, ORS). Urine that is clear or pale yellow means that you are getting enough fluid.  Try to drink at least 8-10 cups of fluid each day to help replace lost  fluids.  You may add other liquids in addition to water, such as clear juice or decaffeinated sports drinks, as tolerated or as told by your health care provider.  Avoid drinks with caffeine, such as coffee, tea, or soft drinks.  Avoid alcohol. What foods are recommended? The items listed may not be a complete list. Talk with your health care provider about what dietary choices are best for you. Grains White rice. White, French, or pita breads (fresh or toasted), including plain rolls, buns, or bagels. White pasta. Saltine, soda, or graham crackers. Pretzels. Low-fiber cereal. Cooked cereals made with water (such as cornmeal, farina, or cream cereals). Plain muffins. Matzo. Melba toast. Zwieback. Vegetables Potatoes (without the skin). Most well-cooked and canned vegetables without skins or seeds. Tender lettuce. Fruits Apple sauce. Fruits canned in juice. Cooked apricots, cherries, grapefruit, peaches, pears, or plums. Fresh bananas and cantaloupe. Meats and other protein foods Baked or boiled chicken. Eggs. Tofu. Fish. Seafood. Smooth nut butters. Ground or well-cooked tender beef, ham, veal, lamb, pork, or poultry. Dairy Plain yogurt, kefir, and unsweetened liquid yogurt. Lactose-free milk, buttermilk, skim milk, or soy milk. Low-fat or nonfat hard cheese. Beverages Water. Low-calorie sports drinks. Fruit juices without pulp. Strained tomato and vegetable juices. Decaffeinated teas. Sugar-free beverages not sweetened with sugar alcohols. Oral rehydration solutions, if approved by your health care provider. Seasoning and other foods Bouillon, broth, or soups made from recommended foods. What foods are not recommended? The items listed may not be a complete list. Talk with your health care provider about what dietary choices are best for you. Grains Whole grain, whole wheat,   bran, or rye breads, rolls, pastas, and crackers. Wild or brown rice. Whole grain or bran cereals. Barley. Oats and  oatmeal. Corn tortillas or taco shells. Granola. Popcorn. Vegetables Raw vegetables. Fried vegetables. Cabbage, broccoli, Brussels sprouts, artichokes, baked beans, beet greens, corn, kale, legumes, peas, sweet potatoes, and yams. Potato skins. Cooked spinach and cabbage. Fruits Dried fruit, including raisins and dates. Raw fruits. Stewed or dried prunes. Canned fruits with syrup. Meat and other protein foods Fried or fatty meats. Deli meats. Chunky nut butters. Nuts and seeds. Beans and lentils. Bacon. Hot dogs. Sausage. Dairy High-fat cheeses. Whole milk, chocolate milk, and beverages made with milk, such as milk shakes. Half-and-half. Cream. sour cream. Ice cream. Beverages Caffeinated beverages (such as coffee, tea, soda, or energy drinks). Alcoholic beverages. Fruit juices with pulp. Prune juice. Soft drinks sweetened with high-fructose corn syrup or sugar alcohols. High-calorie sports drinks. Fats and oils Butter. Cream sauces. Margarine. Salad oils. Plain salad dressings. Olives. Avocados. Mayonnaise. Sweets and desserts Sweet rolls, doughnuts, and sweet breads. Sugar-free desserts sweetened with sugar alcohols such as xylitol and sorbitol. Seasoning and other foods Honey. Hot sauce. Chili powder. Gravy. Cream-based or milk-based soups. Pancakes and waffles. Summary  When you have diarrhea, the foods you eat and your eating habits are very important.  Make sure you get at least 8-10 cups of fluid each day, or enough to keep your urine clear or pale yellow.  Eat bland foods and gradually reintroduce healthy, nutrient-rich foods as tolerated, or as told by your health care provider.  Avoid high-fiber, fried, greasy, or spicy foods. This information is not intended to replace advice given to you by your health care provider. Make sure you discuss any questions you have with your health care provider. Document Released: 12/02/2003 Document Revised: 09/08/2016 Document Reviewed:  09/08/2016 Elsevier Interactive Patient Education  2017 [employer]  

## 2017-01-14 NOTE — MAU Note (Signed)
Patient came to MAU via EMS for abdominal pain and nausea that started early this morning.  "I think I have food poisoning." Afebrile.  Had diarrhea x1 and vomiting x3 today.  Has not yet felt any fetal movement.

## 2017-01-14 NOTE — MAU Provider Note (Signed)
Patient Latoya [name] is a 26 year old G2P1001at 21 weeks and 3 days here with complaints of abdominal pain, nausea/vomiting and diarrhea since 7 am this morning.  Her niece has had a GI illness recently, and her mother also woke up this morning with nausea/vomiting and diarrhea. Patient thinks it is possible that she and mother both got food poisoning last night from eating out at [employer], although other members of their dining party did not get sick.  History     CSN: [id number]  Arrival date and time: 01/14/17 1329   First Provider Initiated Contact with Patient 01/14/17 1422      Chief Complaint  Patient presents with  . Abdominal Pain  . Back Pain  . Nausea   Abdominal Pain  This is a new problem. The onset quality is sudden. The problem occurs intermittently. The pain is located in the generalized abdominal region, LLQ and LUQ. The pain is at a severity of 7/10. The pain is moderate. The quality of the pain is cramping. The abdominal pain does not radiate. Associated symptoms include diarrhea and nausea. Nothing aggravates the pain. The pain is relieved by nothing. She has tried acetaminophen for the symptoms. The treatment provided no relief.    OB History    Gravida Para Term Preterm AB Living   2 1 1  0 0 1   SAB TAB Ectopic Multiple Live Births   0 0 0 0 1      Past Medical History:  Diagnosis Date  . Anemia   . Anxiety   . Depression   . Gestational diabetes   . Pregnancy induced hypertension     Past Surgical History:  Procedure Laterality Date  . DEEP NECK LYMPH NODE BIOPSY / EXCISION  2011    Family History  Problem Relation Age of Onset  . Diabetes Mother   . Hypertension Mother   . Hypertension Father   . Stroke Maternal Grandfather     Social History  Substance Use Topics  . Smoking status: Never Smoker  . Smokeless tobacco: Never Used  . Alcohol use Yes     Comment: "not for a while"    Allergies: No Known Allergies  Prescriptions Prior to  Admission  Medication Sig Dispense Refill Last Dose  . acetaminophen (TYLENOL) 500 MG tablet Take 1,000 mg by mouth every 6 (six) hours as needed for mild pain, moderate pain, fever or headache.    01/14/2017 at 1300  . ibuprofen (ADVIL,MOTRIN) 200 MG tablet Take 800 mg by mouth every 6 (six) hours as needed for fever, headache, mild pain or moderate pain.    01/14/2017 at 0300  . Prenatal Vit-Fe Fumarate-FA (PRENATAL MULTIVITAMIN) TABS tablet Take 1 tablet by mouth daily.    01/13/2017 at Unknown time    Review of Systems  Constitutional: Negative for appetite change.  Respiratory: Negative.   Cardiovascular: Negative.   Gastrointestinal: Positive for abdominal pain, diarrhea and nausea.  Genitourinary: Negative for vaginal bleeding and vaginal discharge.   Physical Exam   Blood pressure (!) 99/56, pulse (!) 112, temperature 98.4 F (36.9 C), temperature source Oral, resp. rate 20, last menstrual period 07/28/2016, SpO2 100 %, not currently breastfeeding.  Physical Exam  Constitutional: She is oriented to person, place, and time. She appears well-developed and well-nourished.  HENT:  Head: Normocephalic.  Neck: Normal range of motion.  Respiratory: Effort normal.  GI: Soft. She exhibits no distension and no mass. There is no tenderness. There is no rebound and  no guarding.  Musculoskeletal: Normal range of motion.  Neurological: She is alert and oriented to person, place, and time. She has normal reflexes.  Skin: Skin is warm and dry.  Psychiatric: She has a normal mood and affect.    MAU Course  Procedures  MDM -UA: positive for ketones, otherwise normal -CMP, CBC: normal -FHR dopplered at 153 -IV fluids and phenergan; patient feeling better. Has only had one episode of vomiting or diarrhea since she has been in MAU; now requesting to be discharged.  Assessment and Plan   1. Gastroenteritis    2. Patient stable for discharge; reviewed when to return to the MAU (bleeding,  leaking of fluid, if the diarrhea and vomiting does not get better) 3. RX for phenergan sent to the pharmacy, reviewed appropriate food choices and comfort measures with GI illness.   Mervyn Skeeters Alajiah Dutkiewicz CNM 01/14/2017, 2:53 PM

## 2017-01-16 ENCOUNTER — Encounter: Payer: Self-pay | Admitting: Obstetrics and Gynecology

## 2017-01-16 DIAGNOSIS — N879 Dysplasia of cervix uteri, unspecified: Secondary | ICD-10-CM | POA: Insufficient documentation

## 2017-01-16 LAB — AFP, QUAD SCREEN
DIA Mom Value: 0.87
DIA Value (EIA): 155.96 pg/mL
DSR (By Age)    1 IN: 1067
DSR (Second Trimester) 1 IN: 10000
Gestational Age: 20.9 WEEKS
MSAFP Mom: 0.58
MSAFP: 32 ng/mL
MSHCG Mom: 0.48
MSHCG: 8758 m[IU]/mL
Maternal Age At EDD: 24.1 yr
Osb Risk: 10000
T18 (By Age): 1:4157 {titer}
Test Results:: NEGATIVE
Weight: 217 [lb_av]
uE3 Mom: 1.14
uE3 Value: 2.12 ng/mL

## 2017-01-17 ENCOUNTER — Institutional Professional Consult (permissible substitution): Payer: Self-pay

## 2017-01-17 NOTE — BH Specialist Note (Deleted)
Integrated Behavioral Health Initial Visit  MRN: [id number] Name: Latoya [name]   Session Start time: *** Session End time: *** Total time: {IBH Total Time:21014050}  Type of Service: [place] Interpretor:No. Interpretor Name and Language: n/a   Warm Hand Off Completed.       SUBJECTIVE: Latoya [name] is a 26 y.o. female accompanied by {Persons; PED relatives w/patient:19415}. Patient was referred by Dr Ilda Basset for depression, SI. Patient reports the following symptoms/concerns: *** Duration of problem: ***; Severity of problem: {Mild/Moderate/Severe:20260}  OBJECTIVE: Mood: {BHH MOOD:22306} and Affect: {BHH AFFECT:22307} Risk of harm to self or others: {CHL AMB BH Suicide Current Mental Status:21022748}   LIFE CONTEXT: Family and Social: *** School/Work: *** Self-Care: *** Life Changes: Current pregnancy ***  GOALS ADDRESSED: Patient will reduce symptoms of: {IBH Symptoms:21014056} and increase knowledge and/or ability of: {IBH Patient Tools:21014057} and also: {IBH Goals:21014053}   INTERVENTIONS: {IBH Interventions:21014054}  Standardized Assessments completed: GAD-7 and PHQ 2&9 with C-SSRS  ASSESSMENT: Patient currently experiencing ***. Patient may benefit from ***.  PLAN: 1. Follow up with behavioral health clinician on : *** 2. Behavioral recommendations: *** 3. Referral(s): {IBH Referrals:21014055} 4. "From scale of 1-10, how likely are you to follow plan?": ***  Cieanna Stormes C Faustina Gebert, LCSWA

## 2017-01-18 ENCOUNTER — Ambulatory Visit (INDEPENDENT_AMBULATORY_CARE_PROVIDER_SITE_OTHER): Payer: Medicaid Other | Admitting: Clinical

## 2017-01-18 ENCOUNTER — Ambulatory Visit (INDEPENDENT_AMBULATORY_CARE_PROVIDER_SITE_OTHER): Payer: Medicaid Other | Admitting: Obstetrics and Gynecology

## 2017-01-18 ENCOUNTER — Encounter: Payer: Self-pay | Admitting: Obstetrics and Gynecology

## 2017-01-18 VITALS — BP 125/65 | HR 105 | Wt 216.0 lb

## 2017-01-18 DIAGNOSIS — Z79899 Other long term (current) drug therapy: Secondary | ICD-10-CM

## 2017-01-18 DIAGNOSIS — O09292 Supervision of pregnancy with other poor reproductive or obstetric history, second trimester: Secondary | ICD-10-CM

## 2017-01-18 DIAGNOSIS — F1911 Other psychoactive substance abuse, in remission: Secondary | ICD-10-CM

## 2017-01-18 DIAGNOSIS — Z87898 Personal history of other specified conditions: Secondary | ICD-10-CM

## 2017-01-18 DIAGNOSIS — O99212 Obesity complicating pregnancy, second trimester: Secondary | ICD-10-CM

## 2017-01-18 DIAGNOSIS — O0992 Supervision of high risk pregnancy, unspecified, second trimester: Secondary | ICD-10-CM

## 2017-01-18 DIAGNOSIS — R7989 Other specified abnormal findings of blood chemistry: Secondary | ICD-10-CM | POA: Insufficient documentation

## 2017-01-18 DIAGNOSIS — Z331 Pregnant state, incidental: Secondary | ICD-10-CM | POA: Diagnosis not present

## 2017-01-18 DIAGNOSIS — Z1389 Encounter for screening for other disorder: Secondary | ICD-10-CM | POA: Diagnosis not present

## 2017-01-18 DIAGNOSIS — R69 Illness, unspecified: Secondary | ICD-10-CM

## 2017-01-18 LAB — PROTEIN / CREATININE RATIO, URINE
Creatinine, Urine: 146.8 mg/dL
Protein, Ur: 18.6 mg/dL
Protein/Creat Ratio: 127 mg/g creat (ref 0–200)

## 2017-01-18 LAB — CMP AND LIVER
ALT: 10 IU/L (ref 0–32)
AST: 11 IU/L (ref 0–40)
Albumin: 4.1 g/dL (ref 3.5–5.5)
Alkaline Phosphatase: 54 IU/L (ref 39–117)
BUN: 5 mg/dL — ABNORMAL LOW (ref 6–20)
Bilirubin Total: <0.2 mg/dL (ref 0.0–1.2)
Bilirubin, Direct: 0.05 mg/dL (ref 0.00–0.40)
CO2: 23 mmol/L (ref 18–29)
Calcium: 10.1 mg/dL (ref 8.7–10.2)
Chloride: 98 mmol/L (ref 96–106)
Creatinine, Ser: 0.48 mg/dL — ABNORMAL LOW (ref 0.57–1.00)
GFR calc Af Amer: 160 mL/min/{1.73_m2} (ref >59)
GFR calc non Af Amer: 139 mL/min/{1.73_m2} (ref >59)
Glucose: 78 mg/dL (ref 65–99)
Potassium: 4.3 mmol/L (ref 3.5–5.2)
Sodium: 137 mmol/L (ref 134–144)
Total Protein: 6.8 g/dL (ref 6.0–8.5)

## 2017-01-18 LAB — OBSTETRIC PANEL, INCLUDING HIV
Antibody Screen: NEGATIVE
Basophils Absolute: 0 10*3/uL (ref 0.0–0.2)
Basos: 0 %
EOS (ABSOLUTE): 0 10*3/uL (ref 0.0–0.4)
Eos: 1 %
HIV Screen 4th Generation wRfx: NONREACTIVE
Hematocrit: 34.1 % (ref 34.0–46.6)
Hemoglobin: 11.7 g/dL (ref 11.1–15.9)
Hepatitis B Surface Ag: NEGATIVE
Immature Grans (Abs): 0 10*3/uL (ref 0.0–0.1)
Immature Granulocytes: 0 %
Lymphocytes Absolute: 2.3 10*3/uL (ref 0.7–3.1)
Lymphs: 33 %
MCH: 28.3 pg (ref 26.6–33.0)
MCHC: 34.3 g/dL (ref 31.5–35.7)
MCV: 83 fL (ref 79–97)
Monocytes Absolute: 0.4 10*3/uL (ref 0.1–0.9)
Monocytes: 6 %
Neutrophils Absolute: 4.2 10*3/uL (ref 1.4–7.0)
Neutrophils: 60 %
Platelets: 332 10*3/uL (ref 150–379)
RBC: 4.13 x10E6/uL (ref 3.77–5.28)
RDW: 14.7 % (ref 12.3–15.4)
RPR Ser Ql: NONREACTIVE
Rh Factor: POSITIVE
Rubella Antibodies, IGG: 1.86 index (ref >0.99)
WBC: 6.9 10*3/uL (ref 3.4–10.8)

## 2017-01-18 LAB — SMN1 COPY NUMBER ANALYSIS (SMA CARRIER SCREENING)

## 2017-01-18 LAB — HEMOGLOBINOPATHY EVALUATION
Ferritin: 16 ng/mL (ref 15–150)
Hgb A2 Quant: 2.4 % (ref 1.8–3.2)
Hgb A: 97.6 % (ref 96.4–98.8)
Hgb C: 0 %
Hgb F Quant: 0 % (ref 0.0–2.0)
Hgb S: 0 %
Hgb Solubility: NEGATIVE
Hgb Variant: 0 %

## 2017-01-18 LAB — MONITOR DRUG PROFILE 10(MW)
Amphetamine Scrn, Ur: NEGATIVE ng/mL
BARBITURATE SCREEN URINE: NEGATIVE ng/mL
BENZODIAZEPINE SCREEN, URINE: NEGATIVE ng/mL
CANNABINOIDS UR QL SCN: NEGATIVE ng/mL
Cocaine (Metab) Scrn, Ur: NEGATIVE ng/mL
Creatinine(Crt), U: 161.2 mg/dL (ref 20.0–300.0)
Methadone Screen, Urine: NEGATIVE ng/mL
OXYCODONE+OXYMORPHONE UR QL SCN: NEGATIVE ng/mL
Opiate Scrn, Ur: NEGATIVE ng/mL
Ph of Urine: 7.7 (ref 4.5–8.9)
Phencyclidine Qn, Ur: NEGATIVE ng/mL
Propoxyphene Scrn, Ur: NEGATIVE ng/mL

## 2017-01-18 LAB — TSH: TSH: 0.315 u[IU]/mL — ABNORMAL LOW (ref 0.450–4.500)

## 2017-01-18 LAB — HEMOGLOBIN A1C
Est. average glucose Bld gHb Est-mCnc: 103 mg/dL
Hgb A1c MFr Bld: 5.2 % (ref 4.8–5.6)

## 2017-01-18 LAB — HEPATITIS C ANTIBODY: Hep C Virus Ab: <0.1 s/co ratio (ref 0.0–0.9)

## 2017-01-18 NOTE — Progress Notes (Signed)
Prenatal Visit Note Date: 01/18/2017 Clinic: [facility]  Subjective:  Latoya [name] is a 26 y.o. G2P1001 at 25w0dbeing seen today for ongoing prenatal care.  She is currently monitored for the following issues for this high-risk pregnancy and has ANEMIA, NORMOCYTIC; Obesity in pregnancy; Supervision of high risk pregnancy in second trimester; Obesity (BMI 30.0-34.9); History of substance abuse; History of gestational diabetes mellitus (GDM); History of gestational hypertension; History of macrosomia in infant in prior pregnancy, currently pregnant; Short interval between pregnancies affecting pregnancy in second trimester, antepartum; Late prenatal care; Paxil use in early pregnancy; History of suicide attempt; Anxiety and depression; Cervical dysplasia; and Abnormal thyroid blood test on her problem list.  Patient reports continued depressive s/s.    Contractions: Not present. Vag. Bleeding: None.  Movement: Absent. Denies leaking of fluid.   The following portions of the patient's history were reviewed and updated as appropriate: allergies, current medications, past family history, past medical history, past social history, past surgical history and problem list. Problem list updated.  Objective:   Vitals:   01/18/17 1524  BP: 125/65  Pulse: (!) 105  Weight: 216 lb (98 kg)    Fetal Status: Fetal Heart Rate (bpm): 141   Movement: Absent     General:  Alert, oriented and cooperative. Patient is in no acute distress.  Skin: Skin is warm and dry. No rash noted.   Cardiovascular: Normal heart rate noted  Respiratory: Normal respiratory effort, no problems with respiration noted  Abdomen: Soft, gravid, appropriate for gestational age. Pain/Pressure: Absent     Pelvic:  Cervical exam deferred        Extremities: Normal range of motion.     Mental Status: Normal mood and affect. Normal behavior. Normal judgment and thought content.   Urinalysis:      Assessment  and Plan:  Pregnancy: G2P1001 at 251w0d1. Abnormal thyroid blood test Will add on free t4 to low tsh blood work  2. Supervision of high risk pregnancy in second trimester Anatomy u/s already scheduled  3. Paxil use in early pregnancy Scheduled for fetal echo at today's visit - Ambulatory referral to Pediatric Cardiology  4. Psych Long d/w pt and she does contract for safety and denies any SI/HI. I can't seem to pinpoint any particular stressors and it does sound like she has a good support system at home (she's living with her mother) that helps her out. Pt to see JaVesta Mixeroday and resources re: walk in clinics for psych care and consideration of medications given to patient. Will need close follow up during the pregnancy.    Preterm labor symptoms and general obstetric precautions including but not limited to vaginal bleeding, contractions, leaking of fluid and fetal movement were reviewed in detail with the patient. Please refer to After Visit Summary for other counseling recommendations.  RTC 2wks   ChAletha HalimMD

## 2017-01-18 NOTE — BH Specialist Note (Signed)
Integrated Behavioral Health Initial Visit  MRN: [id number] Name: Latoya [name]   Session Start time: 4:30 Session End time: 5:00 Total time: 30 minutes  Type of Service: [place] Interpretor:No. Interpretor Name and Language: n/a   Warm Hand Off Completed.       SUBJECTIVE: Latoya [name] is a 26 y.o. female accompanied by patient. Patient was referred by Dr Ilda Basset for depression, anxiety, substance. Patient primary concern is: overwhelming feelings about past substance use potentially harming children emotionally, upcoming court date, feelings about past relationship with FOB, her own childhood trauma. Pt copes by listening to music. Duration of problem: One month sober; Severity of problem: severe  OBJECTIVE: Mood: Anxious, Depressed and Worthless and Affect: Depressed and Tearful Risk of harm to self or others: No plan to harm self or others Worries about harm coming to her if she were to smoke crack again, but no intent to harm, no plan   LIFE CONTEXT: Family and Social: Lives with mother; not currently with FOB(introduced her to crack two years prior, 26yo). Family history of bipolar affective disorder. School/Work: Housekeeping Self-Care: Working, listening to music, used to get hair done, etc.; last time smoked crack was first week March 2018 Life Changes: Current pregnancy, court date pending, dismissed from substance treatment program, no substances one month  GOALS ADDRESSED: Patient will reduce symptoms of: anxiety, depression and stress and increase knowledge and/or ability of: self-management skills and also: Decrease self-medicating behaviors   INTERVENTIONS: Motivational Interviewing and Psychoeducation and/or Health Education  Standardized Assessments completed: GAD-7 and PHQ 9  ASSESSMENT: Patient currently experiencing Diagnosis deferred, History of substance abuse. Patient may benefit from psychoeducation and  brief therapeutic interventions regarding coping with symptoms of anxiety and depression related to past history of substance use.  PLAN: 1. Follow up with behavioral health clinician on : Will call on Tuesday, May 1 for f/u phone check. F/u in office at next medical appointment. 2. Behavioral recommendations:  -Pt mother will contact insurance for additional substance treatment options taking BCBS locally -Pt will get hair done on 01-19-17 -Listen to music throughout the day, as needed to distract from negative thoughts -CALM relaxation breathing exercise daily, as needed throughout the day, to gain control over feelings and as distraction -Read educational material regarding coping with symptoms of anxiety and depression 3. Referral(s): [place] (In Clinic) 4. "From scale of 1-10, how likely are you to follow plan?": 7  Garlan Fair, LCSWA  Depression screen [facility] 2/9 01/10/2017  Decreased Interest 2  Down, Depressed, Hopeless 2  PHQ - 2 Score 4  Altered sleeping 3  Tired, decreased energy 2  Change in appetite 0  Feeling bad or failure about yourself  2  Trouble concentrating 2  Moving slowly or fidgety/restless 0  Suicidal thoughts 2  PHQ-9 Score 15   GAD 7 : Generalized Anxiety Score 01/10/2017  Nervous, Anxious, on Edge 2  Control/stop worrying 2  Worry too much - different things 3  Trouble relaxing 2  Restless 2  Easily annoyed or irritable 2  Afraid - awful might happen 2  Total GAD 7 Score 15

## 2017-01-23 ENCOUNTER — Ambulatory Visit (HOSPITAL_COMMUNITY)
Admission: RE | Admit: 2017-01-23 | Discharge: 2017-01-23 | Disposition: A | Discharge status: Home / Self Care | Payer: Medicaid Other | Source: Ambulatory Visit | Attending: Obstetrics and Gynecology | Admitting: Obstetrics and Gynecology

## 2017-01-23 ENCOUNTER — Telehealth: Payer: Self-pay | Admitting: Clinical

## 2017-01-23 ENCOUNTER — Other Ambulatory Visit: Payer: Self-pay | Admitting: Obstetrics and Gynecology

## 2017-01-23 ENCOUNTER — Encounter (HOSPITAL_COMMUNITY): Payer: Self-pay

## 2017-01-23 DIAGNOSIS — Z8759 Personal history of other complications of pregnancy, childbirth and the puerperium: Secondary | ICD-10-CM

## 2017-01-23 DIAGNOSIS — Z3A22 22 weeks gestation of pregnancy: Secondary | ICD-10-CM | POA: Diagnosis not present

## 2017-01-23 DIAGNOSIS — O99212 Obesity complicating pregnancy, second trimester: Secondary | ICD-10-CM | POA: Diagnosis not present

## 2017-01-23 DIAGNOSIS — Z363 Encounter for antenatal screening for malformations: Secondary | ICD-10-CM | POA: Diagnosis not present

## 2017-01-23 DIAGNOSIS — O09299 Supervision of pregnancy with other poor reproductive or obstetric history, unspecified trimester: Secondary | ICD-10-CM

## 2017-01-23 DIAGNOSIS — O0992 Supervision of high risk pregnancy, unspecified, second trimester: Secondary | ICD-10-CM

## 2017-01-23 DIAGNOSIS — Z8632 Personal history of gestational diabetes: Secondary | ICD-10-CM

## 2017-01-23 DIAGNOSIS — F1910 Other psychoactive substance abuse, uncomplicated: Secondary | ICD-10-CM | POA: Diagnosis not present

## 2017-01-23 DIAGNOSIS — O09892 Supervision of other high risk pregnancies, second trimester: Secondary | ICD-10-CM | POA: Diagnosis not present

## 2017-01-23 DIAGNOSIS — O09292 Supervision of pregnancy with other poor reproductive or obstetric history, second trimester: Secondary | ICD-10-CM | POA: Insufficient documentation

## 2017-01-23 DIAGNOSIS — O99322 Drug use complicating pregnancy, second trimester: Secondary | ICD-10-CM | POA: Insufficient documentation

## 2017-01-23 DIAGNOSIS — Z315 Encounter for genetic counseling: Secondary | ICD-10-CM | POA: Diagnosis not present

## 2017-01-23 NOTE — Telephone Encounter (Signed)
Attempt to f/u with pt, left HIPPA-compliant message to return call to [place] from [place] for [employer] at [facility] at [phone].

## 2017-01-24 ENCOUNTER — Other Ambulatory Visit (HOSPITAL_COMMUNITY): Payer: Self-pay | Admitting: *Deleted

## 2017-01-24 DIAGNOSIS — Z0489 Encounter for examination and observation for other specified reasons: Secondary | ICD-10-CM

## 2017-01-24 DIAGNOSIS — IMO0002 Reserved for concepts with insufficient information to code with codable children: Secondary | ICD-10-CM

## 2017-01-30 ENCOUNTER — Telehealth: Payer: Self-pay | Admitting: Clinical

## 2017-01-30 NOTE — Telephone Encounter (Signed)
Attempt to f/u with pt; left HIPPA-compliant message to return call to [facility] from [facility] for [employer] at [facility], [phone].

## 2017-02-06 ENCOUNTER — Ambulatory Visit (INDEPENDENT_AMBULATORY_CARE_PROVIDER_SITE_OTHER): Payer: Medicaid Other | Admitting: Obstetrics and Gynecology

## 2017-02-06 VITALS — BP 121/69 | HR 110 | Wt 219.0 lb

## 2017-02-06 DIAGNOSIS — R7989 Other specified abnormal findings of blood chemistry: Secondary | ICD-10-CM

## 2017-02-06 DIAGNOSIS — O0992 Supervision of high risk pregnancy, unspecified, second trimester: Secondary | ICD-10-CM

## 2017-02-06 DIAGNOSIS — R946 Abnormal results of thyroid function studies: Secondary | ICD-10-CM | POA: Diagnosis not present

## 2017-02-06 DIAGNOSIS — F419 Anxiety disorder, unspecified: Secondary | ICD-10-CM

## 2017-02-06 DIAGNOSIS — Z79899 Other long term (current) drug therapy: Secondary | ICD-10-CM | POA: Diagnosis not present

## 2017-02-06 DIAGNOSIS — F32A Depression, unspecified: Secondary | ICD-10-CM

## 2017-02-06 DIAGNOSIS — Z87898 Personal history of other specified conditions: Secondary | ICD-10-CM | POA: Diagnosis not present

## 2017-02-06 DIAGNOSIS — O99212 Obesity complicating pregnancy, second trimester: Secondary | ICD-10-CM

## 2017-02-06 DIAGNOSIS — F329 Major depressive disorder, single episode, unspecified: Secondary | ICD-10-CM

## 2017-02-06 DIAGNOSIS — F1911 Other psychoactive substance abuse, in remission: Secondary | ICD-10-CM

## 2017-02-06 DIAGNOSIS — O99210 Obesity complicating pregnancy, unspecified trimester: Secondary | ICD-10-CM

## 2017-02-06 DIAGNOSIS — Z8632 Personal history of gestational diabetes: Secondary | ICD-10-CM

## 2017-02-06 DIAGNOSIS — Z8759 Personal history of other complications of pregnancy, childbirth and the puerperium: Secondary | ICD-10-CM

## 2017-02-06 NOTE — Progress Notes (Signed)
Taking aleve for tooth  Tylenol pm to sleep

## 2017-02-06 NOTE — Progress Notes (Signed)
Prenatal Visit Note Date: 02/06/2017 Clinic: [facility]  Subjective:  Latoya [name] is a 26 y.o. G2P1001 at 62w5dbeing seen today for ongoing prenatal care.  She is currently monitored for the following issues for this high-risk pregnancy and has Obesity in pregnancy; Supervision of high risk pregnancy in second trimester; Obesity (BMI 30.0-34.9); History of substance abuse; History of gestational diabetes mellitus (GDM); History of gestational hypertension; History of macrosomia in infant in prior pregnancy, currently pregnant; Short interval between pregnancies affecting pregnancy in second trimester, antepartum; Late prenatal care; Paxil use in early pregnancy; History of suicide attempt; Anxiety and depression; Cervical dysplasia; and Abnormal thyroid blood test on her problem list.  Patient reports no complaints.   Contractions: Not present. Vag. Bleeding: None.  Movement: Absent. Denies leaking of fluid.   The following portions of the patient's history were reviewed and updated as appropriate: allergies, current medications, past family history, past medical history, past social history, past surgical history and problem list. Problem list updated.  Objective:   Vitals:   02/06/17 1514  BP: 121/69  Pulse: (!) 110  Weight: 219 lb (99.3 kg)    Fetal Status: Fetal Heart Rate (bpm): 141   Movement: Absent     General:  Alert, oriented and cooperative. Patient is in no acute distress.  Skin: Skin is warm and dry. No rash noted.   Cardiovascular: Normal heart rate noted  Respiratory: Normal respiratory effort, no problems with respiration noted  Abdomen: Soft, gravid, appropriate for gestational age. Pain/Pressure: Absent     Pelvic:  Cervical exam deferred        Extremities: Normal range of motion.     Mental Status: Normal mood and affect. Normal behavior. Normal judgment and thought content.   Urinalysis:      Assessment and Plan:  Pregnancy: G2P1001  at 260w5d1. Supervision of high risk pregnancy in second trimester Routine care. Fetal echo later this month - TSH - T4, free - [place] FOLLOW UP; Future - Cystic fibrosis gene test  2. Abnormal thyroid blood test TFTs today  3. Anxiety and depression Doing well. Seems upbeat today. On no medications. Will continue to follow   Preterm labor symptoms and general obstetric precautions including but not limited to vaginal bleeding, contractions, leaking of fluid and fetal movement were reviewed in detail with the patient. Please refer to After Visit Summary for other counseling recommendations.  Return in about 2 weeks (around 02/20/2017) for rob.   PiAletha HalimMD

## 2017-02-07 LAB — T4, FREE: Free T4: 0.83 ng/dL (ref 0.82–1.77)

## 2017-02-07 LAB — TSH: TSH: 0.434 u[IU]/mL — ABNORMAL LOW (ref 0.450–4.500)

## 2017-02-12 LAB — CYSTIC FIBROSIS GENE TEST

## 2017-03-01 ENCOUNTER — Ambulatory Visit (INDEPENDENT_AMBULATORY_CARE_PROVIDER_SITE_OTHER): Payer: Medicaid Other | Admitting: Obstetrics and Gynecology

## 2017-03-01 ENCOUNTER — Ambulatory Visit (INDEPENDENT_AMBULATORY_CARE_PROVIDER_SITE_OTHER): Payer: Self-pay | Admitting: Clinical

## 2017-03-01 VITALS — BP 115/69 | HR 117 | Wt 221.6 lb

## 2017-03-01 DIAGNOSIS — O0992 Supervision of high risk pregnancy, unspecified, second trimester: Secondary | ICD-10-CM

## 2017-03-01 DIAGNOSIS — Z87898 Personal history of other specified conditions: Secondary | ICD-10-CM

## 2017-03-01 DIAGNOSIS — F4323 Adjustment disorder with mixed anxiety and depressed mood: Secondary | ICD-10-CM

## 2017-03-01 DIAGNOSIS — F1911 Other psychoactive substance abuse, in remission: Secondary | ICD-10-CM

## 2017-03-01 DIAGNOSIS — O99212 Obesity complicating pregnancy, second trimester: Secondary | ICD-10-CM

## 2017-03-01 DIAGNOSIS — F32A Depression, unspecified: Secondary | ICD-10-CM

## 2017-03-01 DIAGNOSIS — E669 Obesity, unspecified: Secondary | ICD-10-CM | POA: Diagnosis not present

## 2017-03-01 DIAGNOSIS — F419 Anxiety disorder, unspecified: Secondary | ICD-10-CM

## 2017-03-01 DIAGNOSIS — F329 Major depressive disorder, single episode, unspecified: Secondary | ICD-10-CM | POA: Diagnosis not present

## 2017-03-01 NOTE — Progress Notes (Signed)
Patient to returned to clinic early next week

## 2017-03-01 NOTE — BH Specialist Note (Signed)
Integrated Behavioral Health Follow Up Visit  MRN: [id number] Name: Latoya [name]   Session Start time: 3:12 Session End time: 3:32 Total time: 20 minutes Number of Integrated Behavioral Health Clinician visits: 2/10  Type of Service: [place] Interpretor:No. Interpretor Name and Language: n/a   Warm Hand Off Completed.       SUBJECTIVE: Latoya [name] is a 26 y.o. female accompanied by patient and 15moson. Patient was referred by Dr PIlda Bassetfor depression, anxiety, hx of substances Patient reports the following symptoms/concerns: Pt states her primary goal is to have a healthy baby, and has been able to stay clean for over two months. Pt is thinking about re-establishing care with psychiatrist, and will consider outpatient treatment postpartum, but uncertain about either at this time. Duration of problem: Depression/anxiety since childhood; two months sober; Severity of problem: severe  OBJECTIVE: Mood: Depressed and Affect: Appropriate  Risk of harm to self or others: No plan to harm self or others   LIFE CONTEXT: Family and Social: Lives with mother, getting along better with FOB School/Work: Housekeeping Self-Care: Work, music, getting hair done for self-care; states no substance in over two months Life Changes: Current pregnancy, court date upcoming, no substance two months  GOALS ADDRESSED: Patient will reduce symptoms of: anxiety, depression and stress and increase knowledge and/or ability of: self-management skills and also: Maintain/ Decrease self-medicating behaviors  INTERVENTIONS: Motivational Interviewing Standardized Assessments completed: Patient declined screening, No SI, no HI  ASSESSMENT: Patient currently experiencing Adjustment disorder with anxious and depressed mood, and History of substance use. Patient may benefit from continued brief therapeutic interventions regarding coping with symptoms of anxiety and  depression, related to past substance use.  PLAN: 1. Follow up with behavioral health clinician on : Three weeks(further discussion @ next visit about outpatient treatment options available postpartum) 2. Behavioral recommendations:  -Continue listening to music throughout the day, as needed to distract from negative thoughts -Continue CALM relaxation breathing daily, as needed to gain control over feelings and distraction from negative thoughts -Call and re-establish with psychiatrist of choice prior to next medical visit 3. Referral(s): Integrated [employer](In Clinic) and Psychiatrist  J[place]arlan Fair [place] [facility] 02/06/2017 01/10/2017  Decreased Interest 2 2  Down, Depressed, Hopeless 2 2  PHQ - 2 Score 4 4  Altered sleeping 1 3  Tired, decreased energy 2 2  Change in appetite 0 0  Feeling bad or failure about yourself  1 2  Trouble concentrating 0 2  Moving slowly or fidgety/restless 0 0  Suicidal thoughts 2 2  PHQ-9 Score 10 15   GAD 7 : Generalized Anxiety Score 02/06/2017 01/10/2017  Nervous, Anxious, on Edge 2 2  Control/stop worrying 2 2  Worry too much - different things 2 3  Trouble relaxing 2 2  Restless 0 2  Easily annoyed or irritable 2 2  Afraid - awful might happen 2 2  Total GAD 7 Score 12 15

## 2017-03-01 NOTE — Progress Notes (Signed)
Prenatal Visit Note Date: 03/01/2017 Clinic: [facility]  Subjective:  Latoya [name] is a 26 y.o. G2P1001 at 3w0dbeing seen today for ongoing prenatal care.  She is currently monitored for the following issues for this high-risk pregnancy and has Obesity in pregnancy; Supervision of high risk pregnancy in second trimester; Obesity (BMI 30.0-34.9); History of substance abuse; History of gestational diabetes mellitus (GDM); History of gestational hypertension; History of macrosomia in infant in prior pregnancy, currently pregnant; Short interval between pregnancies affecting pregnancy in second trimester, antepartum; Late prenatal care; Paxil use in early pregnancy; History of suicide attempt; Anxiety and depression; and Cervical dysplasia on her problem list.  Patient reports no complaints.   Contractions: Not present.  .  Movement: Present. Denies leaking of fluid.   The following portions of the patient's history were reviewed and updated as appropriate: allergies, current medications, past family history, past medical history, past social history, past surgical history and problem list. Problem list updated.  Objective:   Vitals:   03/01/17 1501  BP: 115/69  Pulse: (!) 117  Weight: 221 lb 9.6 oz (100.5 kg)    Fetal Status: Fetal Heart Rate (bpm): 141   Movement: Present     General:  Alert, oriented and cooperative. Patient is in no acute distress.  Skin: Skin is warm and dry. No rash noted.   Cardiovascular: Normal heart rate noted  Respiratory: Normal respiratory effort, no problems with respiration noted  Abdomen: Soft, gravid, appropriate for gestational age. Pain/Pressure: Absent     Pelvic:  Cervical exam deferred        Extremities: Normal range of motion.     Mental Status: Normal mood and affect. Normal behavior. Normal judgment and thought content.   Urinalysis:      Assessment and Plan:  Pregnancy: G2P1001 at 23w0d1. Supervision of high  risk pregnancy in second trimester Routine care. Pt missed last appt. Will come back for lab only visit for 28wk labs next week. Follow up completion anatomy scan/growth next week and peds echo later this month  2.. Anxiety and depression Patient states she has a new PCP (Dr. ElRachell Ciproand thought she might do well with anti-depressants. I d/w her that I recommend that she follow up on the resources given by JaVesta Mixern terms of [facility] her closely now and PP and who has the ability to prescribe meds if they believe that is the best route moving forward.   Preterm labor symptoms and general obstetric precautions including but not limited to vaginal bleeding, contractions, leaking of fluid and fetal movement were reviewed in detail with the patient. Please refer to After Visit Summary for other counseling recommendations.  Return in about 2 weeks (around 03/15/2017).   PiAletha HalimMD

## 2017-03-06 ENCOUNTER — Other Ambulatory Visit: Payer: Medicaid Other

## 2017-03-06 DIAGNOSIS — O09892 Supervision of other high risk pregnancies, second trimester: Secondary | ICD-10-CM

## 2017-03-06 DIAGNOSIS — O0930 Supervision of pregnancy with insufficient antenatal care, unspecified trimester: Secondary | ICD-10-CM

## 2017-03-07 ENCOUNTER — Ambulatory Visit (HOSPITAL_COMMUNITY)
Admission: RE | Admit: 2017-03-07 | Discharge: 2017-03-07 | Disposition: A | Discharge status: Home / Self Care | Payer: Medicaid Other | Source: Ambulatory Visit | Attending: Obstetrics and Gynecology | Admitting: Obstetrics and Gynecology

## 2017-03-07 ENCOUNTER — Telehealth: Payer: Self-pay | Admitting: *Deleted

## 2017-03-07 ENCOUNTER — Encounter (HOSPITAL_COMMUNITY): Payer: Self-pay

## 2017-03-07 ENCOUNTER — Other Ambulatory Visit (HOSPITAL_COMMUNITY): Payer: Self-pay | Admitting: *Deleted

## 2017-03-07 ENCOUNTER — Other Ambulatory Visit: Payer: Self-pay | Admitting: Obstetrics and Gynecology

## 2017-03-07 ENCOUNTER — Encounter: Payer: Self-pay | Admitting: Obstetrics and Gynecology

## 2017-03-07 DIAGNOSIS — O99213 Obesity complicating pregnancy, third trimester: Secondary | ICD-10-CM | POA: Diagnosis not present

## 2017-03-07 DIAGNOSIS — O0933 Supervision of pregnancy with insufficient antenatal care, third trimester: Secondary | ICD-10-CM

## 2017-03-07 DIAGNOSIS — Z048 Encounter for examination and observation for other specified reasons: Secondary | ICD-10-CM | POA: Diagnosis not present

## 2017-03-07 DIAGNOSIS — E669 Obesity, unspecified: Secondary | ICD-10-CM | POA: Diagnosis not present

## 2017-03-07 DIAGNOSIS — Z3A28 28 weeks gestation of pregnancy: Secondary | ICD-10-CM | POA: Insufficient documentation

## 2017-03-07 DIAGNOSIS — O0992 Supervision of high risk pregnancy, unspecified, second trimester: Secondary | ICD-10-CM

## 2017-03-07 DIAGNOSIS — Z8759 Personal history of other complications of pregnancy, childbirth and the puerperium: Secondary | ICD-10-CM | POA: Diagnosis not present

## 2017-03-07 DIAGNOSIS — O24410 Gestational diabetes mellitus in pregnancy, diet controlled: Secondary | ICD-10-CM

## 2017-03-07 DIAGNOSIS — Z8632 Personal history of gestational diabetes: Secondary | ICD-10-CM | POA: Diagnosis not present

## 2017-03-07 DIAGNOSIS — Z0489 Encounter for examination and observation for other specified reasons: Secondary | ICD-10-CM

## 2017-03-07 DIAGNOSIS — O0993 Supervision of high risk pregnancy, unspecified, third trimester: Secondary | ICD-10-CM | POA: Insufficient documentation

## 2017-03-07 DIAGNOSIS — IMO0002 Reserved for concepts with insufficient information to code with codable children: Secondary | ICD-10-CM

## 2017-03-07 DIAGNOSIS — Z3689 Encounter for other specified antenatal screening: Secondary | ICD-10-CM | POA: Diagnosis not present

## 2017-03-07 LAB — CBC
Hematocrit: 32.4 % — ABNORMAL LOW (ref 34.0–46.6)
Hemoglobin: 11 g/dL — ABNORMAL LOW (ref 11.1–15.9)
MCH: 28.1 pg (ref 26.6–33.0)
MCHC: 34 g/dL (ref 31.5–35.7)
MCV: 83 fL (ref 79–97)
Platelets: 293 10*3/uL (ref 150–379)
RBC: 3.92 x10E6/uL (ref 3.77–5.28)
RDW: 14.8 % (ref 12.3–15.4)
WBC: 7.3 10*3/uL (ref 3.4–10.8)

## 2017-03-07 LAB — GLUCOSE TOLERANCE, 2 HOURS W/ 1HR
Glucose, 1 hour: 206 mg/dL — ABNORMAL HIGH (ref 65–179)
Glucose, 2 hour: 149 mg/dL (ref 65–152)
Glucose, Fasting: 99 mg/dL — ABNORMAL HIGH (ref 65–91)

## 2017-03-07 LAB — RPR: RPR Ser Ql: NONREACTIVE

## 2017-03-07 LAB — HIV ANTIBODY (ROUTINE TESTING W REFLEX): HIV Screen 4th Generation wRfx: NONREACTIVE

## 2017-03-07 NOTE — ED Notes (Signed)
Pt reports no fetal movement today.

## 2017-03-07 NOTE — Telephone Encounter (Signed)
Called pt and left message requesting her to call back and state whether a detailed message can be left on her voice mail. Per Dr. Ilda Basset, pt has GDM. She also had GDM with her previous pregnancy in 2017 and she may still have a meter and supplies. Pt can be scheduled for appt w/Diabetes Counselor ([facility]) to review diet.

## 2017-03-14 NOTE — Telephone Encounter (Signed)
Called patient no answer left a detailed voice mail for patient to call back.

## 2017-03-16 NOTE — Telephone Encounter (Signed)
Attempted to call patient again at cell number. A female answered and said the patient is at work and I can call her home number to leave a message. Called home number, left voice mail stating I am calling with test results, please return my call on Monday and state whether a detailed message can be left on her voice mail.

## 2017-03-20 NOTE — Telephone Encounter (Signed)
Patient has appointment schedule for 03/21/2017. We will address her diabetes results on 03/21/2017 since we have been unsuccessful at reaching her by phone.

## 2017-03-21 ENCOUNTER — Ambulatory Visit (INDEPENDENT_AMBULATORY_CARE_PROVIDER_SITE_OTHER): Payer: Medicaid Other | Admitting: Obstetrics & Gynecology

## 2017-03-21 VITALS — BP 113/61 | HR 131 | Wt 224.9 lb

## 2017-03-21 DIAGNOSIS — O0993 Supervision of high risk pregnancy, unspecified, third trimester: Secondary | ICD-10-CM

## 2017-03-21 DIAGNOSIS — O24419 Gestational diabetes mellitus in pregnancy, unspecified control: Secondary | ICD-10-CM | POA: Diagnosis present

## 2017-03-21 DIAGNOSIS — O0992 Supervision of high risk pregnancy, unspecified, second trimester: Secondary | ICD-10-CM

## 2017-03-21 MED ORDER — ACCU-CHEK NANO SMARTVIEW W/DEVICE KIT: 1.0000 | PACK | 0 refills | Status: DC

## 2017-03-21 MED ORDER — GLUCOSE BLOOD VI STRP: ORAL_STRIP | 12 refills | Status: DC

## 2017-03-21 MED ORDER — ACCU-CHEK FASTCLIX LANCETS MISC: 1.0000 [IU] | Freq: Four times a day (QID) | 12 refills | Status: DC

## 2017-03-21 NOTE — Patient Instructions (Signed)
Gestational Diabetes Mellitus, Diagnosis Gestational diabetes (gestational diabetes mellitus) is a temporary form of diabetes that some women develop during pregnancy. It usually occurs around weeks 24-28 of pregnancy and goes away after delivery. Hormonal changes during pregnancy can interfere with insulin production and function, which may result in one or both of these problems:  The pancreas does not make enough of a hormone called insulin.  Cells in the body do not respond properly to insulin that the body makes (insulin resistance).  Normally, insulin allows sugars (glucose) to enter cells in the body. The cells use glucose for energy. Insulin resistance or lack of insulin causes excess glucose to build up in the blood instead of going into cells. As a result, high blood glucose (hyperglycemia) develops. What are the risks? If gestational diabetes is treated, it is unlikely to cause problems. If it is not controlled with treatment, it may cause problems during labor and delivery, and some of those problems can be harmful to the unborn baby (fetus) and the mother. Uncontrolled gestational diabetes may also cause the newborn baby to have breathing problems and low blood glucose. Women who get gestational diabetes are more likely to develop it if they get pregnant again, and they are more likely to develop type 2 diabetes in the future. What increases the risk? This condition may be more likely to develop in pregnant women who:  Are older than age 2 during pregnancy.  Have a family history of diabetes.  Are overweight.  Had gestational diabetes in the past.  Have polycystic ovarian syndrome (PCOS).  Are pregnant with twins or multiples.  Are of American-Indian, African-American, Hispanic/Latino, or Asian/Pacific Islander descent.  What are the signs or symptoms? Most women do not notice symptoms of gestational diabetes because the symptoms are similar to normal symptoms of pregnancy.  Symptoms of gestational diabetes may include:  Increased thirst (polydipsia).  Increased hunger(polyphagia).  Increased urination (polyuria).  How is this diagnosed?  This condition may be diagnosed based on your blood glucose level, which may be checked with one or more of the following blood tests:  A fasting blood glucose (FBG) test. You will not be allowed to eat (you will fast) for at least 8 hours before a blood sample is taken.  A random blood glucose test. This checks your blood glucose at any time of day regardless of when you ate.  An oral glucose tolerance test (OGTT). This is usually done during weeks 24-28 of pregnancy. ? For this test, you will have an FBG test done. Then, you will drink a beverage that contains glucose. Your blood glucose will be tested again 1 hour after drinking the glucose beverage (1-hour OGTT). ? If the 1-hour OGTT result is at or above 140 mg/dL (7.8 mmol/L), you will repeat the OGTT. This time, your blood glucose will be tested 3 hours after drinking the glucose beverage (3-hour OGTT).  If you have risk factors, you may be screened for undiagnosed type 2 diabetes at your first health care visit during your pregnancy (prenatal visit). How is this treated?  Your treatment may be managed by a specialist called an endocrinologist. This condition is treated by following instructions from your health care provider about:  Eating a healthier diet and getting more physical activity. These changes are the most important ways to manage gestational diabetes.  Checking your blood glucose. Do this as often as told.  Taking diabetes medicines or insulin every day. These will only be prescribed if they are  needed. ? If you use insulin, you may need to adjust your dosage based on how physically active you are and what foods you eat. Your health care provider will tell you how to do this.  Your health care provider will set treatment goals for you based on the  stage of your pregnancy and any other medical conditions you have. Generally, the goal of treatment is to maintain the following blood glucose levels during pregnancy:  Fasting: at or below 95 mg/dL (5.3 mmol/L).  After meals (postprandial): ? One hour after a meal: at or below 140 mg/dL (7.8 mmol/L). ? Two hours after a meal: at or below 120 mg/dL (6.7 mmol/L).  A1c (hemoglobin A1c) level: 6-6.5%.  Follow these instructions at home:  Take over-the-counter and prescription medicines only as told by your health care provider.  Manage your weight gain during pregnancy. The amount of weight that you are expected to gain depends on your pre-pregnancy BMI (body mass index).  Keep all follow-up visits as told by your health care provider. This is important. Consider asking your health care provider these questions:   Do I need to meet with a diabetes educator?  Where can I find a support group for people with diabetes?  What equipment will I need to manage my diabetes at home?  What diabetes medicines do I need, and when should I take them?  How often do I need to check my blood glucose?  What number can I call if I have questions?  When is my next appointment? Where to find more information:  For more information about diabetes, visit: ? American Diabetes Association (ADA): www.diabetes.org ? American Association of Diabetes Educators (AADE): www.diabeteseducator.org/patient-resources Contact a health care provider if:  Your blood glucose level is at or above 240 mg/dL (13.3 mmol/L).  Your blood glucose level is at or above 200 mg/dL (11.1 mmol/L) and you have ketones in your urine.  You have been sick or have had a fever for 2 days or more and you are not getting better.  You have any of the following problems for more than 6 hours: ? You cannot eat or drink. ? You have nausea and vomiting. ? You have diarrhea. Get help right away if:  Your blood glucose is below 54  mg/dL (3 mmol/L).  You become confused or you have trouble thinking clearly.  You have difficulty breathing.  You have moderate or large ketone levels in your urine.  Your baby is moving around less than usual.  You develop unusual discharge or bleeding from your vagina.  You start having contractions early (prematurely). Contractions may feel like a tightening in your lower abdomen. This information is not intended to replace advice given to you by your health care provider. Make sure you discuss any questions you have with your health care provider. Document Released: 12/18/2000 Document Revised: 02/17/2016 Document Reviewed: 10/15/2015 Elsevier Interactive Patient Education  2017 [employer]    Gestational Diabetes Mellitus, Self Care Caring for yourself after you have been diagnosed with gestational diabetes (gestational diabetes mellitus) means keeping your blood sugar (glucose) under control with a balance of:  Nutrition.  Exercise.  Lifestyle changes.  Medicines or insulin, if necessary.  Support from your team of health care providers and others.  The following information explains what you need to know to manage your gestational diabetes at home. What do I need to do to manage my blood glucose?  Check your blood glucose every day during your pregnancy.  Do this as often as told by your health care provider.  Contact your health care provider if your blood glucose is above your target for 2 tests in a row. Your health care provider will set individualized treatment goals for you. Generally, the goal of treatment is to maintain the following blood glucose levels during pregnancy:  After not eating for 8 hours (after fasting): at or below 95 mg/dL (5.3 mmol/L).  After meals (postprandial): ? One hour after a meal: at or below 140 mg/dL (7.8 mmol/L). ? Two hours after a meal: at or below 120 mg/dL (6.7 mmol/L).  A1c (hemoglobin A1c) level: 6-6.5%.  What do I  need to know about hyperglycemia and hypoglycemia? What is hyperglycemia? Hyperglycemia, also called high blood glucose, occurs when blood glucose is too high. Make sure you know the early signs of hyperglycemia, such as:  Increased thirst.  Hunger.  Feeling very tired.  Needing to urinate more often than usual.  Blurry vision.  What is hypoglycemia? Hypoglycemia, also called low blood glucose, occurswith a blood glucose level at or below 70 mg/dL (3.9 mmol/L). The risk for hypoglycemia increases during or after exercise, during sleep, during illness, and when skipping meals or not eating for a long time (fasting). It is important to know the symptoms of hypoglycemia and treat it right away. Always have a 15-gram rapid-acting carbohydrate snack with you to treat low blood glucose.Family members and close friends should also know the symptoms and should understand how to treat hypoglycemia, in case you are not able to treat yourself. What are the symptoms of hypoglycemia? Hypoglycemia symptoms can include:  Hunger.  Anxiety.  Sweating and feeling clammy.  Confusion.  Dizziness or feeling light-headed.  Sleepiness.  Nausea.  Increased heart rate.  Headache.  Blurry vision.  Seizure.  Nightmares.  Tingling or numbness around the mouth, lips, or tongue.  A change in speech.  Decreased ability to concentrate.  A change in coordination.  Restless sleep.  Tremors or shakes.  Fainting.  Irritability.  How do I treat hypoglycemia?  If you are alert and able to swallow safely, follow the 15:15 rule:  Take 15 grams of a rapid-acting carbohydrate. Rapid-acting options include: ? 1 tube of glucose gel. ? 3 glucose pills. ? 6-8 pieces of hard candy. ? 4 oz (120 mL) of fruit juice. ? 4 oz (120 mL) of regular (not diet) soda.  Check your blood glucose 15 minutes after you take the carbohydrate.  If the repeat blood glucose level is still at or below 70 mg/dL  (3.9 mmol/L), take 15 grams of a carbohydrate again.  If your blood glucose level does not increase above 70 mg/dL (3.9 mmol/L) after 3 tries, seek emergency medical care.  After your blood glucose level returns to normal, eat a meal or a snack within 1 hour.  How do I treat severe hypoglycemia? Severe hypoglycemia is when your blood glucose level is at or below 54 mg/dL (3 mmol/L). Severe hypoglycemia is an emergency. Do not wait to see if the symptoms will go away. Get medical help right away. Call your local emergency services (911 in the [place] Do not drive yourself to the hospital. If you have severe hypoglycemia and you cannot eat or drink, you may need an injection of glucagon. A family member or close friend should learn how to check your blood glucose and how to give you a glucagon injection. Ask your health care provider if you need to have an emergency glucagon  injection kit available. Severe hypoglycemia may need to be treated in a hospital. The treatment may include getting glucose through an IV tube. You may also need treatment for the cause of your hypoglycemia. What else can I do to manage my gestational diabetes? Take your diabetes medicines as told  If your health care provider prescribed insulin or diabetes medicines, take them every day.  Do not run out of insulin or other diabetes medicines that you take. Plan ahead so you always have these available.  If you use insulin, adjust your dosage based on how physically active you are and what foods you eat. Your health care provider will tell you how to adjust your dosage. Make healthy food choices  The things that you eat and drink affect your blood glucose. Making good choices helps to control your diabetes and prevent other health problems. A healthy meal plan includes eating lean proteins, complex carbohydrates, fresh fruits and vegetables, low-fat dairy products, and healthy fats. Make an appointment to see a diet and  nutrition specialist (registered dietitian) to help you create an eating plan that is right for you. Make sure that you:  Follow instructions from your health care provider about eating or drinking restrictions.  Drink enough fluid to keep your urine clear or pale yellow.  Eat healthy snacks between nutritious meals.  Track the carbohydrates that you eat. Do this by reading food labels and learning the standard serving sizes of foods.  Follow your sick day plan whenever you cannot eat or drink as usual. Make this plan in advance with your health care provider.  Stay active   Do at least 30 minutes of physical activity a day, or as much physical activity as your health care provider recommends during your pregnancy. ? Doing 10 minutes of exercise 30 minutes after each meal may help to control postprandial blood glucose levels.  If you start a new exercise or activity, work with your health care provider to adjust your insulin, medicines, or food intake as needed. Make healthy lifestyle choices  Do not drink alcohol.  Do not use any tobacco products, such as cigarettes, chewing tobacco, and e-cigarettes. If you need help quitting, ask your health care provider.  Learn to manage stress. If you need help with this, ask your health care provider. Care for your body  Keep your immunizations up to date.  Brush your teeth and gums two times a day, and floss at least one time a day.  Visit your dentist at least once every 6 months.  Maintain a healthy weight during your pregnancy. General instructions   Take over-the-counter and prescription medicines only as told by your health care provider.  Talk with your health care provider about your risk for high blood pressure during pregnancy (preeclampsia or eclampsia).  Share your diabetes management plan with people in your workplace, school, and household.  Check your urine for ketones during your pregnancy when you are ill and as  told by your health care provider.  Carry a medical alert card or wear medical alert jewelry.  Ask your health care provider: ? Do I need to meet with a diabetes educator? ? Where can I find a support group for people with diabetes?  Keep all follow-up visits during your pregnancy (prenatal) and after delivery (postnatal) as told by your health care provider. This is important. Get the care that you need after delivery  Have your blood glucose level checked 4-12 weeks after delivery. This is done with  an oral glucose tolerance test (OGTT).  Get screened for diabetes at least every 3 years, or as often as told by your health care provider. Where to find more information: To learn more about gestational diabetes, visit:  American Diabetes Association (ADA): www.diabetes.org/diabetes-basics/gestational  Centers for Disease Control and Prevention (CDC): http://sanchez-watson.com/.pdf  This information is not intended to replace advice given to you by your health care provider. Make sure you discuss any questions you have with your health care provider. Document Released: 01/03/2016 Document Revised: 02/17/2016 Document Reviewed: 10/15/2015 Elsevier Interactive Patient Education  2017 [employer].

## 2017-03-21 NOTE — Progress Notes (Signed)
   PRENATAL VISIT NOTE  Subjective:  Latoya [name] is a 26 y.o. G2P1001 at 71w6dbeing seen today for ongoing prenatal care.  She is currently monitored for the following issues for this high-risk pregnancy and has GDM (gestational diabetes mellitus); Obesity in pregnancy; Supervision of high risk pregnancy in second trimester; Obesity (BMI 30.0-34.9); History of substance abuse; History of gestational diabetes mellitus (GDM); History of gestational hypertension; History of macrosomia in infant in prior pregnancy, currently pregnant; Short interval between pregnancies affecting pregnancy in second trimester, antepartum; Late prenatal care; Paxil use in early pregnancy; History of suicide attempt; Anxiety and depression; and Cervical dysplasia on her problem list.  Patient reports no complaints.  Contractions: Not present. Vag. Bleeding: None.  Movement: Present. Denies leaking of fluid.   The following portions of the patient's history were reviewed and updated as appropriate: allergies, current medications, past family history, past medical history, past social history, past surgical history and problem list. Problem list updated.  Objective:   Vitals:   03/21/17 1419  BP: 113/61  Pulse: (!) 131  Weight: 224 lb 14.4 oz (102 kg)    Fetal Status: Fetal Heart Rate (bpm): 158   Movement: Present     General:  Alert, oriented and cooperative. Patient is in no acute distress.  Skin: Skin is warm and dry. No rash noted.   Cardiovascular: Normal heart rate noted  Respiratory: Normal respiratory effort, no problems with respiration noted  Abdomen: Soft, gravid, appropriate for gestational age. Pain/Pressure: Absent     Pelvic:  Cervical exam deferred        Extremities: Normal range of motion.  Edema: None  Mental Status: Normal mood and affect. Normal behavior. Normal judgment and thought content.   Assessment and Plan:  Pregnancy: G2P1001 at 311w6d1. Gestational diabetes mellitus (GDM)  in third trimester, gestational diabetes method of control unspecified Discussed diagnosis of GDM, patient is unable to come on Monday mornings to meet DM educator. She had GDM last pregnancy, was able to verbalize expectations of diet and how to check BS. Prescribed monitoring equipment for her. [facility] Will evaluate blood sugars at next visit. Growth scan scheduled. Discussed implications of GDM in pregnancy, need for optimizing glycemic control to decrease GDM associated maternal-fetal morbidity and mortality, possible need for antenatal testing and frequent ultrasounds/prenatal visits.   - Referral to Nutrition and Diabetes Services - Blood Glucose Monitoring Suppl (ACCU-CHEK NANO SMARTVIEW) w/Device KIT; 1 kit by Subdermal route as directed. Check blood sugars for fasting, and two hours after breakfast, lunch and dinner (4 checks daily)  Dispense: 1 kit; Refill: 0 - glucose blood (ACCU-CHEK SMARTVIEW) test strip; Use as instructed to check blood sugars  Dispense: 100 each; Refill: 12 - ACCU-CHEK FASTCLIX LANCETS MISC; 1 Units by Percutaneous route 4 (four) times daily.  Dispense: 100 each; Refill: 12  2. Supervision of high risk pregnancy in second trimester Preterm labor symptoms and general obstetric precautions including but not limited to vaginal bleeding, contractions, leaking of fluid and fetal movement were reviewed in detail with the patient. Please refer to After Visit Summary for other counseling recommendations.  Return in about 2 weeks (around 04/04/2017) [facility]   UgVerita SchneidersMD

## 2017-03-29 ENCOUNTER — Other Ambulatory Visit: Payer: Self-pay | Admitting: *Deleted

## 2017-03-29 DIAGNOSIS — O24419 Gestational diabetes mellitus in pregnancy, unspecified control: Secondary | ICD-10-CM

## 2017-03-29 MED ORDER — GLUCOSE BLOOD VI STRP: ORAL_STRIP | 12 refills | Status: DC

## 2017-04-04 ENCOUNTER — Encounter (HOSPITAL_COMMUNITY): Payer: Self-pay

## 2017-04-04 ENCOUNTER — Ambulatory Visit (HOSPITAL_COMMUNITY)
Admission: RE | Admit: 2017-04-04 | Discharge: 2017-04-04 | Disposition: A | Discharge status: Home / Self Care | Payer: Medicaid Other | Source: Ambulatory Visit | Attending: Obstetrics and Gynecology | Admitting: Obstetrics and Gynecology

## 2017-04-04 DIAGNOSIS — Z3A32 32 weeks gestation of pregnancy: Secondary | ICD-10-CM | POA: Insufficient documentation

## 2017-04-04 DIAGNOSIS — O24410 Gestational diabetes mellitus in pregnancy, diet controlled: Secondary | ICD-10-CM | POA: Insufficient documentation

## 2017-04-04 HISTORY — DX: Other psychoactive substance abuse, in remission: F19.11

## 2017-04-08 ENCOUNTER — Inpatient Hospital Stay (HOSPITAL_COMMUNITY)
Admission: AD | Admit: 2017-04-08 | Discharge: 2017-04-08 | Disposition: A | Discharge status: Home / Self Care | Payer: Medicaid Other | Source: Ambulatory Visit | Attending: Obstetrics & Gynecology | Admitting: Obstetrics & Gynecology

## 2017-04-08 ENCOUNTER — Encounter (HOSPITAL_COMMUNITY): Payer: Self-pay

## 2017-04-08 DIAGNOSIS — O219 Vomiting of pregnancy, unspecified: Secondary | ICD-10-CM

## 2017-04-08 DIAGNOSIS — R109 Unspecified abdominal pain: Secondary | ICD-10-CM | POA: Insufficient documentation

## 2017-04-08 DIAGNOSIS — O9989 Other specified diseases and conditions complicating pregnancy, childbirth and the puerperium: Secondary | ICD-10-CM | POA: Diagnosis not present

## 2017-04-08 DIAGNOSIS — O99613 Diseases of the digestive system complicating pregnancy, third trimester: Secondary | ICD-10-CM | POA: Insufficient documentation

## 2017-04-08 DIAGNOSIS — Z3A33 33 weeks gestation of pregnancy: Secondary | ICD-10-CM | POA: Diagnosis not present

## 2017-04-08 DIAGNOSIS — O26893 Other specified pregnancy related conditions, third trimester: Secondary | ICD-10-CM

## 2017-04-08 DIAGNOSIS — A084 Viral intestinal infection, unspecified: Secondary | ICD-10-CM | POA: Diagnosis not present

## 2017-04-08 LAB — URINALYSIS, ROUTINE W REFLEX MICROSCOPIC
Bacteria, UA: NONE SEEN
Bilirubin Urine: NEGATIVE
Glucose, UA: NEGATIVE mg/dL
Hgb urine dipstick: NEGATIVE
Ketones, ur: 80 mg/dL — AB
Leukocytes, UA: NEGATIVE
Nitrite: NEGATIVE
Protein, ur: 30 mg/dL — AB
Specific Gravity, Urine: 1.016 (ref 1.005–1.030)
pH: 7 (ref 5.0–8.0)

## 2017-04-08 LAB — RAPID URINE DRUG SCREEN, HOSP PERFORMED
Amphetamines: NOT DETECTED
Barbiturates: NOT DETECTED
Benzodiazepines: NOT DETECTED
Cocaine: NOT DETECTED
Opiates: NOT DETECTED
Tetrahydrocannabinol: NOT DETECTED

## 2017-04-08 LAB — AMNISURE RUPTURE OF MEMBRANE (ROM) NOT AT ARMC: Amnisure ROM: NEGATIVE

## 2017-04-08 LAB — FETAL FIBRONECTIN: Fetal Fibronectin: NEGATIVE

## 2017-04-08 LAB — GLUCOSE, CAPILLARY: Glucose-Capillary: 105 mg/dL — ABNORMAL HIGH (ref 65–99)

## 2017-04-08 MED ORDER — ONDANSETRON HCL 4 MG PO TABS: 4.0000 mg | ORAL_TABLET | Freq: Three times a day (TID) | ORAL | 0 refills | Status: DC | PRN

## 2017-04-08 MED ORDER — LACTATED RINGERS IV BOLUS (SEPSIS)
1000.0000 mL | Freq: Once | INTRAVENOUS | Status: AC
  Administered 2017-04-08: 1000 mL via INTRAVENOUS

## 2017-04-08 MED ORDER — PROMETHAZINE HCL 25 MG PO TABS: 12.5000 mg | ORAL_TABLET | Freq: Four times a day (QID) | ORAL | 0 refills | Status: DC | PRN

## 2017-04-08 MED ORDER — ONDANSETRON HCL 4 MG/2ML IJ SOLN
4.0000 mg | Freq: Once | INTRAMUSCULAR | Status: AC
  Administered 2017-04-08: 4 mg via INTRAVENOUS
  Filled 2017-04-08: qty 2

## 2017-04-08 NOTE — MAU Note (Signed)
Patient states she is starting to feel much better.

## 2017-04-08 NOTE — MAU Note (Signed)
Attempted to obtain cath urine sample patient unable to tolerate, refused, and desires to try to obtain clean catch sample.

## 2017-04-08 NOTE — MAU Note (Signed)
Patient presents with vomiting all day and feels like having contractions x 1 hour.  Denies vaginal bleeding.  Positive FM FHR 135

## 2017-04-08 NOTE — MAU Provider Note (Signed)
Chief Complaint:  Emesis and Abdominal Pain   First Provider Initiated Contact with Patient 04/08/17 1512      HPI: Latoya [name] is a 26 y.o. G2P1001 at 25w3dwith GDM, hx substance abuse, anxiety/depression, hx GHTN who presents to maternity admissions reporting onset of n/v yesterday and onset of cramping/back pain today. She reports she took Zofran early this morning but it did not help. She has vomited 7-8 times in 24 hours.  She also reports leaking of fluid she believes is urine whenever she vomits. This is making it difficult to leave a urine sample in MAU because she is emptying her bladder when she vomits.  She denies recent illegal drug use. She reports good fetal movement, denies vaginal bleeding, vaginal itching/burning, urinary symptoms, h/a, dizziness, or fever/chills.    HPI  Past Medical History: Past Medical History:  Diagnosis Date  . Anemia   . Anxiety   . Depression   . Gestational diabetes   . History of substance abuse   . Pregnancy induced hypertension     Past obstetric history: OB History  Gravida Para Term Preterm AB Living  2 1 1  0 0 1  SAB TAB Ectopic Multiple Live Births  0 0 0 0 1    # Outcome Date GA Lbr Len/2nd Weight Sex Delivery Anes PTL Lv  2 Current           1 Term 03/27/16 358w2d3:44 / 02:35 9 lb 2.4 oz (4.15 kg) M Vag-Spont EPI  LIV      Past Surgical History: Past Surgical History:  Procedure Laterality Date  . DEEP NECK LYMPH NODE BIOPSY / EXCISION  2011    Family History: Family History  Problem Relation Age of Onset  . Diabetes Mother   . Hypertension Mother   . Hypertension Father   . Stroke Maternal Grandfather     Social History: Social History  Substance Use Topics  . Smoking status: Never Smoker  . Smokeless tobacco: Never Used  . Alcohol use Yes     Comment: "not for a while"    Allergies: No Known Allergies  Meds:  Prescriptions Prior to Admission  Medication Sig Dispense Refill Last Dose  .  acetaminophen (TYLENOL) 500 MG tablet Take 1,000 mg by mouth every 6 (six) hours as needed for mild pain, moderate pain, fever or headache.    04/07/2017 at 2300  . diphenhydrAMINE (BENADRYL) 25 MG tablet Take 50 mg by mouth at bedtime as needed for sleep.   04/07/2017 at Unknown time  . Ibuprofen-Diphenhydramine Cit (ADVIL PM) 200-38 MG TABS Take 2-4 tablets by mouth at bedtime as needed (for sleep/pain).   Past Week at Unknown time  . Prenatal Vit-Fe Fumarate-FA (PRENATAL MULTIVITAMIN) TABS tablet Take 1 tablet by mouth daily.    04/07/2017 at Unknown time    ROS:  Review of Systems  Constitutional: Negative for chills, fatigue and fever.  Eyes: Negative for visual disturbance.  Respiratory: Negative for shortness of breath.   Cardiovascular: Negative for chest pain.  Gastrointestinal: Positive for abdominal pain, nausea and vomiting. Negative for constipation and diarrhea.  Genitourinary: Negative for difficulty urinating, dysuria, flank pain, pelvic pain, vaginal bleeding, vaginal discharge and vaginal pain.  Musculoskeletal: Positive for back pain.  Neurological: Negative for dizziness and headaches.  Psychiatric/Behavioral: Negative.   All other systems reviewed and are negative.    I have reviewed patient's Past Medical Hx, Surgical Hx, Family Hx, Social Hx, medications and allergies.   Physical  Exam   Patient Vitals for the past 24 hrs:  BP Temp Pulse Resp Weight  04/08/17 1508 122/65 98.1 F (36.7 C) (!) 117 (!) 22 -  04/08/17 1502 - - - - 223 lb 1.3 oz (101.2 kg)   Constitutional: Well-developed, well-nourished female in no acute distress.  HEART: normal rate, heart sounds, regular rhythm RESP: normal effort, lung sounds clear and equal bilaterally GI: Abd soft, non-tender, gravid appropriate for gestational age.  MS: Extremities nontender, no edema, normal ROM Neurologic: Alert and oriented x 4.  GU: Neg CVAT.  PELVIC EXAM: No pooling noted on SSE with valsalva,  amnisure collected  Cervix 1/50/-3, vertex, mid position   Dilation: 1 Effacement (%): 50 Exam by:: Fatima Blank CNM  FHT:  Baseline 135 , moderate variability, 10x10 accels, intermittent tracing and MHR also elevated so monitors adjusted several times for improved tracing, no decelerations noted Contractions: q 2-10 minutes, irregular, mild to palpation   Labs: Results for orders placed or performed during the hospital encounter of 04/08/17 (from the past 24 hour(s))  Glucose, capillary     Status: Abnormal   Collection Time: 04/08/17  3:32 PM  Result Value Ref Range   Glucose-Capillary 105 (H) 65 - 99 mg/dL  Amnisure rupture of membrane (rom)not at [facility]     Status: None   Collection Time: 04/08/17  3:49 PM  Result Value Ref Range   Amnisure ROM NEGATIVE   Fetal fibronectin     Status: None   Collection Time: 04/08/17  3:49 PM  Result Value Ref Range   Fetal Fibronectin NEGATIVE NEGATIVE  Urinalysis, Routine w reflex microscopic     Status: Abnormal   Collection Time: 04/08/17  3:50 PM  Result Value Ref Range   Color, Urine YELLOW YELLOW   APPearance CLEAR CLEAR   Specific Gravity, Urine 1.016 1.005 - 1.030   pH 7.0 5.0 - 8.0   Glucose, UA NEGATIVE NEGATIVE mg/dL   Hgb urine dipstick NEGATIVE NEGATIVE   Bilirubin Urine NEGATIVE NEGATIVE   Ketones, ur 80 (A) NEGATIVE mg/dL   Protein, ur 30 (A) NEGATIVE mg/dL   Nitrite NEGATIVE NEGATIVE   Leukocytes, UA NEGATIVE NEGATIVE   RBC / HPF 0-5 0 - 5 RBC/hpf   WBC, UA 0-5 0 - 5 WBC/hpf   Bacteria, UA NONE SEEN NONE SEEN   Squamous Epithelial / LPF 0-5 (A) NONE SEEN   Mucous PRESENT   Urine rapid drug screen (hosp performed)     Status: None   Collection Time: 04/08/17  3:50 PM  Result Value Ref Range   Opiates NONE DETECTED NONE DETECTED   Cocaine NONE DETECTED NONE DETECTED   Benzodiazepines NONE DETECTED NONE DETECTED   Amphetamines NONE DETECTED NONE DETECTED   Tetrahydrocannabinol NONE DETECTED NONE DETECTED    Barbiturates NONE DETECTED NONE DETECTED   O/Positive/-- (04/18 1355)  Imaging:    MAU Course/MDM: I have ordered labs and reviewed results.  NST reviewed and reactive No evidence of preterm labor or rupture of membranes with cervix unchanged in 2 hours in MAU and negative amnisure IV fluids and Zofran 4 mg IV given, pt reports significant improvement in symptoms but did have 1 episode of diarrhea while in MAU Likely viral gastroenteritis Rx for Zofran and Phenergan F/U in office as scheduled Preterm labor precautions reviewed Pt stable at time of discharge.  Today's evaluation included a work-up for preterm labor which can be life-threatening for both mom and baby.  Assessment: 1. Viral gastroenteritis  2. Nausea and vomiting during pregnancy   3. Abdominal pain during pregnancy in third trimester     Plan: Discharge home Labor precautions and fetal kick counts Follow-up [place] Follow up.   Why:  Return to MAU as needed for emergencies Contact information: [place] [id number] [phone]         Allergies as of 04/08/2017   No Known Allergies     Medication List    STOP taking these medications   ADVIL PM 200-38 MG Tabs Generic drug:  Ibuprofen-Diphenhydramine Cit     TAKE these medications   acetaminophen 500 MG tablet Commonly known as:  TYLENOL Take 1,000 mg by mouth every 6 (six) hours as needed for mild pain, moderate pain, fever or headache.   diphenhydrAMINE 25 MG tablet Commonly known as:  BENADRYL Take 50 mg by mouth at bedtime as needed for sleep.   ondansetron 4 MG tablet Commonly known as:  ZOFRAN Take 1 tablet (4 mg total) by mouth every 8 (eight) hours as needed for nausea or vomiting.   prenatal multivitamin Tabs tablet Take 1 tablet by mouth daily.   promethazine 25 MG tablet Commonly known as:  PHENERGAN Take 0.5-1 tablets (12.5-25 mg total) by mouth every  6 (six) hours as needed for nausea.       Fatima Blank Certified Nurse-Midwife 04/08/2017 6:38 PM

## 2017-04-09 ENCOUNTER — Encounter: Payer: Self-pay | Admitting: Obstetrics and Gynecology

## 2017-04-19 ENCOUNTER — Ambulatory Visit (INDEPENDENT_AMBULATORY_CARE_PROVIDER_SITE_OTHER): Payer: Medicaid Other | Admitting: Family Medicine

## 2017-04-19 VITALS — BP 123/67 | HR 119 | Wt 228.0 lb

## 2017-04-19 DIAGNOSIS — O09892 Supervision of other high risk pregnancies, second trimester: Secondary | ICD-10-CM

## 2017-04-19 DIAGNOSIS — O0992 Supervision of high risk pregnancy, unspecified, second trimester: Secondary | ICD-10-CM | POA: Diagnosis present

## 2017-04-19 DIAGNOSIS — F329 Major depressive disorder, single episode, unspecified: Secondary | ICD-10-CM

## 2017-04-19 DIAGNOSIS — F419 Anxiety disorder, unspecified: Secondary | ICD-10-CM

## 2017-04-19 DIAGNOSIS — O24410 Gestational diabetes mellitus in pregnancy, diet controlled: Secondary | ICD-10-CM | POA: Diagnosis not present

## 2017-04-19 DIAGNOSIS — Z8759 Personal history of other complications of pregnancy, childbirth and the puerperium: Secondary | ICD-10-CM

## 2017-04-19 DIAGNOSIS — F32A Depression, unspecified: Secondary | ICD-10-CM

## 2017-04-19 NOTE — Progress Notes (Signed)
   PRENATAL VISIT NOTE  Subjective:  Latoya [name] is a 26 y.o. G2P1001 at 52w0dbeing seen today for ongoing prenatal care.  She is currently monitored for the following issues for this high-risk pregnancy and has GDM (gestational diabetes mellitus); Obesity in pregnancy; Supervision of high risk pregnancy in second trimester; Obesity (BMI 30.0-34.9); History of substance abuse; History of gestational diabetes mellitus (GDM); History of gestational hypertension; History of macrosomia in infant in prior pregnancy, currently pregnant; Short interval between pregnancies affecting pregnancy in second trimester, antepartum; Late prenatal care; Paxil use in early pregnancy; History of suicide attempt; Anxiety and depression; and Cervical dysplasia on her problem list.  Patient reports no complaints.  Contractions: Not present. Vag. Bleeding: None.  Movement: Present. Denies leaking of fluid.   The following portions of the patient's history were reviewed and updated as appropriate: allergies, current medications, past family history, past medical history, past social history, past surgical history and problem list. Problem list updated.  Objective:   Vitals:   04/19/17 1253  BP: 123/67  Pulse: (!) 119  Weight: 228 lb (103.4 kg)    Fetal Status: Fetal Heart Rate (bpm): 151   Movement: Present     General:  Alert, oriented and cooperative. Patient is in no acute distress.  Skin: Skin is warm and dry. No rash noted.   Cardiovascular: Normal heart rate noted  Respiratory: Normal respiratory effort, no problems with respiration noted  Abdomen: Soft, gravid, appropriate for gestational age.  Pain/Pressure: Absent     Pelvic: Cervical exam deferred        Extremities: Normal range of motion.  Edema: None  Mental Status:  Normal mood and affect. Normal behavior. Normal judgment and thought content.   Assessment and Plan:  Pregnancy: G2P1001 at 389w0d1. Supervision of high risk pregnancy in  second trimester FHT and FH normal. BP normal  2. Diet controlled gestational diabetes mellitus (GDM) in third trimester Pt not checking CBGs. Discussed why checking CBGs are important, particularly with increased risk of fetal death. [place] 69[place]  3. Short interval between pregnancies affecting pregnancy in second trimester, antepartum  4. History of gestational hypertension BP controlled  5. Anxiety and depression H/o PPD. Paxil postpartum  Preterm labor symptoms and general obstetric precautions including but not limited to vaginal bleeding, contractions, leaking of fluid and fetal movement were reviewed in detail with the patient. Please refer to After Visit Summary for other counseling recommendations.  Return in about 1 week (around 04/26/2017) for OB f/u.   JaTruett MainlandDO

## 2017-04-25 ENCOUNTER — Ambulatory Visit: Payer: Self-pay

## 2017-04-25 ENCOUNTER — Ambulatory Visit (INDEPENDENT_AMBULATORY_CARE_PROVIDER_SITE_OTHER): Payer: Medicaid Other | Admitting: Advanced Practice Midwife

## 2017-04-25 ENCOUNTER — Encounter: Payer: Self-pay | Admitting: Advanced Practice Midwife

## 2017-04-25 ENCOUNTER — Ambulatory Visit (INDEPENDENT_AMBULATORY_CARE_PROVIDER_SITE_OTHER): Payer: Medicaid Other | Admitting: *Deleted

## 2017-04-25 VITALS — BP 113/75 | HR 117 | Wt 226.9 lb

## 2017-04-25 DIAGNOSIS — O24419 Gestational diabetes mellitus in pregnancy, unspecified control: Secondary | ICD-10-CM

## 2017-04-25 DIAGNOSIS — O0993 Supervision of high risk pregnancy, unspecified, third trimester: Secondary | ICD-10-CM

## 2017-04-25 DIAGNOSIS — Z113 Encounter for screening for infections with a predominantly sexual mode of transmission: Secondary | ICD-10-CM

## 2017-04-25 DIAGNOSIS — O24410 Gestational diabetes mellitus in pregnancy, diet controlled: Secondary | ICD-10-CM

## 2017-04-25 DIAGNOSIS — O0990 Supervision of high risk pregnancy, unspecified, unspecified trimester: Secondary | ICD-10-CM

## 2017-04-25 LAB — POCT URINALYSIS DIP (DEVICE)
Bilirubin Urine: NEGATIVE
Glucose, UA: NEGATIVE mg/dL
Ketones, ur: NEGATIVE mg/dL
Nitrite: NEGATIVE
Protein, ur: 30 mg/dL — AB
Specific Gravity, Urine: 1.02 (ref 1.005–1.030)
Urobilinogen, UA: 0.2 mg/dL (ref 0.0–1.0)
pH: 7 (ref 5.0–8.0)

## 2017-04-25 LAB — GLUCOSE, CAPILLARY: Glucose-Capillary: 103 mg/dL — ABNORMAL HIGH (ref 65–99)

## 2017-04-25 NOTE — Patient Instructions (Signed)
Third Trimester of Pregnancy The third trimester is from week 28 through week 40 (months 7 through 9). The third trimester is a time when the unborn baby (fetus) is growing rapidly. At the end of the ninth month, the fetus is about 20 inches in length and weighs 6-10 pounds. Body changes during your third trimester Your body will continue to go through many changes during pregnancy. The changes vary from woman to woman. During the third trimester:  Your weight will continue to increase. You can expect to gain 25-35 pounds (11-16 kg) by the end of the pregnancy.  You may begin to get stretch marks on your hips, abdomen, and breasts.  You may urinate more often because the fetus is moving lower into your pelvis and pressing on your bladder.  You may develop or continue to have heartburn. This is caused by increased hormones that slow down muscles in the digestive tract.  You may develop or continue to have constipation because increased hormones slow digestion and cause the muscles that push waste through your intestines to relax.  You may develop hemorrhoids. These are swollen veins (varicose veins) in the rectum that can itch or be painful.  You may develop swollen, bulging veins (varicose veins) in your legs.  You may have increased body aches in the pelvis, back, or thighs. This is due to weight gain and increased hormones that are relaxing your joints.  You may have changes in your hair. These can include thickening of your hair, rapid growth, and changes in texture. Some women also have hair loss during or after pregnancy, or hair that feels dry or thin. Your hair will most likely return to normal after your baby is born.  Your breasts will continue to grow and they will continue to become tender. A yellow fluid (colostrum) may leak from your breasts. This is the first milk you are producing for your baby.  Your belly button may stick out.  You may notice more swelling in your hands,  face, or ankles.  You may have increased tingling or numbness in your hands, arms, and legs. The skin on your belly may also feel numb.  You may feel short of breath because of your expanding uterus.  You may have more problems sleeping. This can be caused by the size of your belly, increased need to urinate, and an increase in your body's metabolism.  You may notice the fetus "dropping," or moving lower in your abdomen (lightening).  You may have increased vaginal discharge.  You may notice your joints feel loose and you may have pain around your pelvic bone.  What to expect at prenatal visits You will have prenatal exams every 2 weeks until week 36. Then you will have weekly prenatal exams. During a routine prenatal visit:  You will be weighed to make sure you and the baby are growing normally.  Your blood pressure will be taken.  Your abdomen will be measured to track your baby's growth.  The fetal heartbeat will be listened to.  Any test results from the previous visit will be discussed.  You may have a cervical check near your due date to see if your cervix has softened or thinned (effaced).  You will be tested for Group B streptococcus. This happens between 35 and 37 weeks.  Your health care provider may ask you:  What your birth plan is.  How you are feeling.  If you are feeling the baby move.  If you have had  any abnormal symptoms, such as leaking fluid, bleeding, severe headaches, or abdominal cramping.  If you are using any tobacco products, including cigarettes, chewing tobacco, and electronic cigarettes.  If you have any questions.  Other tests or screenings that may be performed during your third trimester include:  Blood tests that check for low iron levels (anemia).  Fetal testing to check the health, activity level, and growth of the fetus. Testing is done if you have certain medical conditions or if there are problems during the  pregnancy.  Nonstress test (NST). This test checks the health of your baby to make sure there are no signs of problems, such as the baby not getting enough oxygen. During this test, a belt is placed around your belly. The baby is made to move, and its heart rate is monitored during movement.  What is false labor? False labor is a condition in which you feel small, irregular tightenings of the muscles in the womb (contractions) that usually go away with rest, changing position, or drinking water. These are called Braxton Hicks contractions. Contractions may last for hours, days, or even weeks before true labor sets in. If contractions come at regular intervals, become more frequent, increase in intensity, or become painful, you should see your health care provider. What are the signs of labor?  Abdominal cramps.  Regular contractions that start at 10 minutes apart and become stronger and more frequent with time.  Contractions that start on the top of the uterus and spread down to the lower abdomen and back.  Increased pelvic pressure and dull back pain.  A watery or bloody mucus discharge that comes from the vagina.  Leaking of amniotic fluid. This is also known as your "water breaking." It could be a slow trickle or a gush. Let your health care provider know if it has a color or strange odor. If you have any of these signs, call your health care provider right away, even if it is before your due date. Follow these instructions at home: Medicines  Follow your health care provider's instructions regarding medicine use. Specific medicines may be either safe or unsafe to take during pregnancy.  Take a prenatal vitamin that contains at least 600 micrograms (mcg) of folic acid.  If you develop constipation, try taking a stool softener if your health care provider approves. Eating and drinking  Eat a balanced diet that includes fresh fruits and vegetables, whole grains, good sources of protein  such as meat, eggs, or tofu, and low-fat dairy. Your health care provider will help you determine the amount of weight gain that is right for you.  Avoid raw meat and uncooked cheese. These carry germs that can cause birth defects in the baby.  If you have low calcium intake from food, talk to your health care provider about whether you should take a daily calcium supplement.  Eat four or five small meals rather than three large meals a day.  Limit foods that are high in fat and processed sugars, such as fried and sweet foods.  To prevent constipation: ? Drink enough fluid to keep your urine clear or pale yellow. ? Eat foods that are high in fiber, such as fresh fruits and vegetables, whole grains, and beans. Activity  Exercise only as directed by your health care provider. Most women can continue their usual exercise routine during pregnancy. Try to exercise for 30 minutes at least 5 days a week. Stop exercising if you experience uterine contractions.  Avoid heavy  lifting.  Do not exercise in extreme heat or humidity, or at high altitudes.  Wear low-heel, comfortable shoes.  Practice good posture.  You may continue to have sex unless your health care provider tells you otherwise. Relieving pain and discomfort  Take frequent breaks and rest with your legs elevated if you have leg cramps or low back pain.  Take warm sitz baths to soothe any pain or discomfort caused by hemorrhoids. Use hemorrhoid cream if your health care provider approves.  Wear a good support bra to prevent discomfort from breast tenderness.  If you develop varicose veins: ? Wear support pantyhose or compression stockings as told by your healthcare provider. ? Elevate your feet for 15 minutes, 3-4 times a day. Prenatal care  Write down your questions. Take them to your prenatal visits.  Keep all your prenatal visits as told by your health care provider. This is important. Safety  Wear your seat belt at  all times when driving.  Make a list of emergency phone numbers, including numbers for family, friends, the hospital, and police and fire departments. General instructions  Avoid cat litter boxes and soil used by cats. These carry germs that can cause birth defects in the baby. If you have a cat, ask someone to clean the litter box for you.  Do not travel far distances unless it is absolutely necessary and only with the approval of your health care provider.  Do not use hot tubs, steam rooms, or saunas.  Do not drink alcohol.  Do not use any products that contain nicotine or tobacco, such as cigarettes and e-cigarettes. If you need help quitting, ask your health care provider.  Do not use any medicinal herbs or unprescribed drugs. These chemicals affect the formation and growth of the baby.  Do not douche or use tampons or scented sanitary pads.  Do not cross your legs for long periods of time.  To prepare for the arrival of your baby: ? Take prenatal classes to understand, practice, and ask questions about labor and delivery. ? Make a trial run to the hospital. ? Visit the hospital and tour the maternity area. ? Arrange for maternity or paternity leave through employers. ? Arrange for family and friends to take care of pets while you are in the hospital. ? Purchase a rear-facing car seat and make sure you know how to install it in your car. ? Pack your hospital bag. ? Prepare the baby's nursery. Make sure to remove all pillows and stuffed animals from the baby's crib to prevent suffocation.  Visit your dentist if you have not gone during your pregnancy. Use a soft toothbrush to brush your teeth and be gentle when you floss. Contact a health care provider if:  You are unsure if you are in labor or if your water has broken.  You become dizzy.  You have mild pelvic cramps, pelvic pressure, or nagging pain in your abdominal area.  You have lower back pain.  You have persistent  nausea, vomiting, or diarrhea.  You have an unusual or bad smelling vaginal discharge.  You have pain when you urinate. Get help right away if:  Your water breaks before 37 weeks.  You have regular contractions less than 5 minutes apart before 37 weeks.  You have a fever.  You are leaking fluid from your vagina.  You have spotting or bleeding from your vagina.  You have severe abdominal pain or cramping.  You have rapid weight loss or weight gain.  You have shortness of breath with chest pain.  You notice sudden or extreme swelling of your face, hands, ankles, feet, or legs.  Your baby makes fewer than 10 movements in 2 hours.  You have severe headaches that do not go away when you take medicine.  You have vision changes. Summary  The third trimester is from week 28 through week 40, months 7 through 9. The third trimester is a time when the unborn baby (fetus) is growing rapidly.  During the third trimester, your discomfort may increase as you and your baby continue to gain weight. You may have abdominal, leg, and back pain, sleeping problems, and an increased need to urinate.  During the third trimester your breasts will keep growing and they will continue to become tender. A yellow fluid (colostrum) may leak from your breasts. This is the first milk you are producing for your baby.  False labor is a condition in which you feel small, irregular tightenings of the muscles in the womb (contractions) that eventually go away. These are called Braxton Hicks contractions. Contractions may last for hours, days, or even weeks before true labor sets in.  Signs of labor can include: abdominal cramps; regular contractions that start at 10 minutes apart and become stronger and more frequent with time; watery or bloody mucus discharge that comes from the vagina; increased pelvic pressure and dull back pain; and leaking of amniotic fluid. This information is not intended to replace advice  given to you by your health care provider. Make sure you discuss any questions you have with your health care provider. Document Released: 09/05/2001 Document Revised: 02/17/2016 Document Reviewed: 11/12/2012 Elsevier Interactive Patient Education  2017 [employer].

## 2017-04-25 NOTE — Progress Notes (Signed)
Pt informed that the ultrasound is considered a limited OB ultrasound and is not intended to be a complete ultrasound exam.  Patient also informed that the ultrasound is not being completed with the intent of assessing for fetal or placental anomalies or any pelvic abnormalities.  Explained that the purpose of today's ultrasound is to assess for presentation, BPP and amniotic fluid volume.  Patient acknowledges the purpose of the exam and the limitations of the study.    Pt had prenatal visit earlier today w/Heather Norman Herrlich, CNM. NST/BPP ordered due to pt's poor compliance with checking blood sugars and unknown glucose control.

## 2017-04-25 NOTE — Progress Notes (Signed)
   PRENATAL VISIT NOTE  Subjective:  Latoya [name] is a 26 y.o. G2P1001 at 19w6dbeing seen today for ongoing prenatal care.  She is currently monitored for the following issues for this high-risk pregnancy and has GDM (gestational diabetes mellitus); Obesity in pregnancy; Supervision of high risk pregnancy in second trimester; Obesity (BMI 30.0-34.9); History of substance abuse; History of gestational diabetes mellitus (GDM); History of gestational hypertension; History of macrosomia in infant in prior pregnancy, currently pregnant; Short interval between pregnancies affecting pregnancy in second trimester, antepartum; Late prenatal care; Paxil use in early pregnancy; History of suicide attempt; Anxiety and depression; and Cervical dysplasia on her problem list.  Patient reports contractions since off and on .   . Vag. Bleeding: None.  Movement: Present. Denies leaking of fluid.   The following portions of the patient's history were reviewed and updated as appropriate: allergies, current medications, past family history, past medical history, past social history, past surgical history and problem list. Problem list updated.  Objective:   Vitals:   04/25/17 1001  BP: 113/75  Pulse: (!) 117  Weight: 226 lb 14.4 oz (102.9 kg)    Fetal Status: Fetal Heart Rate (bpm): 140   Movement: Present     General:  Alert, oriented and cooperative. Patient is in no acute distress.  Skin: Skin is warm and dry. No rash noted.   Cardiovascular: Normal heart rate noted  Respiratory: Normal respiratory effort, no problems with respiration noted  Abdomen: Soft, gravid, appropriate for gestational age.  Pain/Pressure: Present     Pelvic: Cervical exam deferred      FH 35 CM  Extremities: Normal range of motion.  Edema: None  Mental Status:  Normal mood and affect. Normal behavior. Normal judgment and thought content.   Assessment and Plan:  Pregnancy: G2P1001 at 33w6d1. Supervision of high risk  pregnancy, antepartum - Culture, beta strep (group b only) - GC/Chlamydia Probe Amp  2. Diet controlled gestational diabetes mellitus (GDM) in third trimester - Patient states that she has not been able to obtain test strips at this time, and has not started checking her blood sugar. Patient states that last year she could not afford the test strips, so she has not tried to get them this time. She has the monitor.  - [place] 7/11 EFW 69%tile  - Poor compliance, will get growth [place] the week - needs to start antenatal testing BPP/NST q week.  - Random CBG today: 103  Preterm labor symptoms and general obstetric precautions including but not limited to vaginal bleeding, contractions, leaking of fluid and fetal movement were reviewed in detail with the patient. Please refer to After Visit Summary for other counseling recommendations.  Return in about 1 week (around 05/02/2017).   HeMarcille BuffyCNM

## 2017-04-26 ENCOUNTER — Other Ambulatory Visit (HOSPITAL_COMMUNITY)
Admission: RE | Admit: 2017-04-26 | Discharge: 2017-04-26 | Disposition: A | Discharge status: Home / Self Care | Payer: Medicaid Other | Source: Ambulatory Visit | Attending: Advanced Practice Midwife | Admitting: Advanced Practice Midwife

## 2017-04-26 DIAGNOSIS — O24410 Gestational diabetes mellitus in pregnancy, diet controlled: Secondary | ICD-10-CM | POA: Diagnosis not present

## 2017-04-26 DIAGNOSIS — O0993 Supervision of high risk pregnancy, unspecified, third trimester: Secondary | ICD-10-CM | POA: Diagnosis not present

## 2017-04-26 DIAGNOSIS — Z3A36 36 weeks gestation of pregnancy: Secondary | ICD-10-CM | POA: Diagnosis not present

## 2017-04-26 NOTE — Addendum Note (Signed)
Addended by: Neta Ehlers on: 04/26/2017 09:07 AM   Modules accepted: Orders

## 2017-04-27 LAB — GC/CHLAMYDIA PROBE AMP (~~LOC~~) NOT AT ARMC
Chlamydia: NEGATIVE
Neisseria Gonorrhea: NEGATIVE

## 2017-04-29 LAB — CULTURE, BETA STREP (GROUP B ONLY): Strep Gp B Culture: NEGATIVE

## 2017-04-30 ENCOUNTER — Ambulatory Visit (HOSPITAL_COMMUNITY)
Admission: RE | Admit: 2017-04-30 | Discharge: 2017-04-30 | Disposition: A | Discharge status: Home / Self Care | Payer: Medicaid Other | Source: Ambulatory Visit | Attending: Advanced Practice Midwife | Admitting: Advanced Practice Midwife

## 2017-04-30 ENCOUNTER — Ambulatory Visit (INDEPENDENT_AMBULATORY_CARE_PROVIDER_SITE_OTHER): Payer: Medicaid Other | Admitting: *Deleted

## 2017-04-30 ENCOUNTER — Other Ambulatory Visit: Payer: Self-pay | Admitting: Advanced Practice Midwife

## 2017-04-30 ENCOUNTER — Ambulatory Visit (INDEPENDENT_AMBULATORY_CARE_PROVIDER_SITE_OTHER): Payer: Medicaid Other | Admitting: Obstetrics & Gynecology

## 2017-04-30 ENCOUNTER — Encounter (HOSPITAL_COMMUNITY): Payer: Self-pay

## 2017-04-30 VITALS — BP 122/69 | HR 101 | Wt 225.4 lb

## 2017-04-30 DIAGNOSIS — F1910 Other psychoactive substance abuse, uncomplicated: Secondary | ICD-10-CM | POA: Diagnosis not present

## 2017-04-30 DIAGNOSIS — O0933 Supervision of pregnancy with insufficient antenatal care, third trimester: Secondary | ICD-10-CM | POA: Diagnosis not present

## 2017-04-30 DIAGNOSIS — O09891 Supervision of other high risk pregnancies, first trimester: Secondary | ICD-10-CM | POA: Diagnosis not present

## 2017-04-30 DIAGNOSIS — O09893 Supervision of other high risk pregnancies, third trimester: Secondary | ICD-10-CM | POA: Insufficient documentation

## 2017-04-30 DIAGNOSIS — O09299 Supervision of pregnancy with other poor reproductive or obstetric history, unspecified trimester: Secondary | ICD-10-CM | POA: Diagnosis not present

## 2017-04-30 DIAGNOSIS — O99213 Obesity complicating pregnancy, third trimester: Secondary | ICD-10-CM | POA: Insufficient documentation

## 2017-04-30 DIAGNOSIS — O0990 Supervision of high risk pregnancy, unspecified, unspecified trimester: Secondary | ICD-10-CM

## 2017-04-30 DIAGNOSIS — O0993 Supervision of high risk pregnancy, unspecified, third trimester: Secondary | ICD-10-CM

## 2017-04-30 DIAGNOSIS — O24410 Gestational diabetes mellitus in pregnancy, diet controlled: Secondary | ICD-10-CM | POA: Insufficient documentation

## 2017-04-30 DIAGNOSIS — O24419 Gestational diabetes mellitus in pregnancy, unspecified control: Secondary | ICD-10-CM

## 2017-04-30 DIAGNOSIS — Z362 Encounter for other antenatal screening follow-up: Secondary | ICD-10-CM | POA: Diagnosis not present

## 2017-04-30 DIAGNOSIS — Z3A36 36 weeks gestation of pregnancy: Secondary | ICD-10-CM | POA: Diagnosis not present

## 2017-04-30 DIAGNOSIS — O99320 Drug use complicating pregnancy, unspecified trimester: Secondary | ICD-10-CM | POA: Diagnosis not present

## 2017-04-30 LAB — POCT URINALYSIS DIP (DEVICE)
Bilirubin Urine: NEGATIVE
Glucose, UA: 100 mg/dL — AB
Hgb urine dipstick: NEGATIVE
Ketones, ur: 15 mg/dL — AB
Nitrite: NEGATIVE
Protein, ur: 30 mg/dL — AB
Specific Gravity, Urine: 1.025 (ref 1.005–1.030)
Urobilinogen, UA: 0.2 mg/dL (ref 0.0–1.0)
pH: 6.5 (ref 5.0–8.0)

## 2017-04-30 MED ORDER — GLYBURIDE 2.5 MG PO TABS: 2.5000 mg | ORAL_TABLET | Freq: Every day | ORAL | 3 refills | Status: DC

## 2017-04-30 NOTE — Patient Instructions (Signed)
Gestational Diabetes Mellitus, Diagnosis Gestational diabetes (gestational diabetes mellitus) is a short-term (temporary) form of diabetes that can happen during pregnancy. It goes away after you give birth. It may be caused by one or both of these problems:  Your body does not make enough of a hormone called insulin.  Your body does not respond in a normal way to insulin that it makes.  Insulin lets sugars (glucose) go into cells in the body. This gives you energy. If you have diabetes, sugars cannot get into cells. This causes high blood sugar (hyperglycemia). If diabetes is treated, it may not hurt you or your baby. Your doctor will set treatment goals for you. In general, you should have these blood sugar levels:  After not eating for a long time (fasting): 95 mg/dL (5.3 mmol/L).  After meals (postprandial): ? One hour after a meal: at or below 140 mg/dL (7.8 mmol/L). ? Two hours after a meal: at or below 120 mg/dL (6.7 mmol/L).  A1c (hemoglobin A1c) level: 6-6.5%.  Follow these instructions at home: Questions to Ask Your Doctor  You may want to ask these questions:  Do I need to meet with a diabetes educator?  Where can I find a support group for people with diabetes?  What equipment will I need to care for myself at home?  What diabetes medicines do I need? When should I take them?  How often do I need to check my blood sugar?  What number can I call if I have questions?  When is my next doctor's visit?  General instructions  Take over-the-counter and prescription medicines only as told by your doctor.  Stay at a healthy weight during pregnancy.  Keep all follow-up visits as told by your doctor. This is important. Contact a doctor if:  Your blood sugar is at or above 240 mg/dL (13.3 mmol/L).  Your blood sugar is at or above 200 mg/dL (11.1 mmol/L) and you have ketones in your pee (urine).  You have been sick or have had a fever for 2 days or more and you are  not getting better.  You have any of these problems for more than 6 hours: ? You cannot eat or drink. ? You feel sick to your stomach (nauseous). ? You throw up (vomit). ? You have watery poop (diarrhea). Get help right away if:  Your blood sugar is lower than 54 mg/dL (3 mmol/L).  You get confused.  You have trouble: ? Thinking clearly. ? Breathing.  Your baby moves less than normal.  You have: ? Moderate or large ketone levels in your pee (urine). ? Bleeding from your vagina. ? Unusual fluid coming from your vagina. ? Early contractions. These may feel like tightness in your belly. This information is not intended to replace advice given to you by your health care provider. Make sure you discuss any questions you have with your health care provider. Document Released: 01/03/2016 Document Revised: 02/17/2016 Document Reviewed: 10/15/2015 Elsevier Interactive Patient Education  [employer].

## 2017-04-30 NOTE — Progress Notes (Signed)
   PRENATAL VISIT NOTE  Subjective:  Latoya [name] is a 26 y.o. G2P1001 at 72w4dbeing seen today for ongoing prenatal care.  She is currently monitored for the following issues for this high-risk pregnancy and has GDM (gestational diabetes mellitus); Obesity in pregnancy; Supervision of high risk pregnancy, antepartum; Obesity (BMI 30.0-34.9); History of substance abuse; History of gestational diabetes mellitus (GDM); History of gestational hypertension; History of macrosomia in infant in prior pregnancy, currently pregnant; Short interval between pregnancies affecting pregnancy in second trimester, antepartum; Late prenatal care; Paxil use in early pregnancy; History of suicide attempt; Anxiety and depression; and Cervical dysplasia on her problem list.  Patient reports no complaints.  Contractions: Not present. Vag. Bleeding: None.  Movement: Present. Denies leaking of fluid.   The following portions of the patient's history were reviewed and updated as appropriate: allergies, current medications, past family history, past medical history, past social history, past surgical history and problem list. Problem list updated.  Objective:   Vitals:   04/30/17 1108  BP: 122/69  Pulse: (!) 101  Weight: 225 lb 6.4 oz (102.2 kg)    Fetal Status: Fetal Heart Rate (bpm): NST   Movement: Present     General:  Alert, oriented and cooperative. Patient is in no acute distress.  Skin: Skin is warm and dry. No rash noted.   Cardiovascular: Normal heart rate noted  Respiratory: Normal respiratory effort, no problems with respiration noted  Abdomen: Soft, gravid, appropriate for gestational age.  Pain/Pressure: Present     Pelvic: Cervical exam deferred        Extremities: Normal range of motion.  Edema: None  Mental Status:  Normal mood and affect. Normal behavior. Normal judgment and thought content.   Assessment and Plan:  Pregnancy: G2P1001 at 371w4d1. Gestational diabetes mellitus (GDM) in  third trimester, gestational diabetes method of control unspecified FBS 100-112 and PP up to 160 add medication - glyBURIDE (DIABETA) 2.5 MG tablet; Take 1 tablet (2.5 mg total) by mouth at bedtime.  Dispense: 30 tablet; Refill: 3  2. Supervision of high risk pregnancy, antepartum  - glyBURIDE (DIABETA) 2.5 MG tablet; Take 1 tablet (2.5 mg total) by mouth at bedtime.  Dispense: 30 tablet; Refill: 3  Preterm labor symptoms and general obstetric precautions including but not limited to vaginal bleeding, contractions, leaking of fluid and fetal movement were reviewed in detail with the patient. Please refer to After Visit Summary for other counseling recommendations.  Return in about 3 days (around 05/03/2017) for NST only,  8/13  NST/BPP, Ob fu.   JaEmeterio ReeveMD

## 2017-04-30 NOTE — Progress Notes (Signed)
[place] for growth done today

## 2017-05-03 ENCOUNTER — Other Ambulatory Visit: Payer: Self-pay

## 2017-05-07 ENCOUNTER — Ambulatory Visit: Payer: Self-pay

## 2017-05-07 ENCOUNTER — Ambulatory Visit (INDEPENDENT_AMBULATORY_CARE_PROVIDER_SITE_OTHER): Payer: Medicaid Other | Admitting: *Deleted

## 2017-05-07 ENCOUNTER — Other Ambulatory Visit: Payer: Self-pay | Admitting: Family Medicine

## 2017-05-07 ENCOUNTER — Ambulatory Visit (INDEPENDENT_AMBULATORY_CARE_PROVIDER_SITE_OTHER): Payer: Medicaid Other | Admitting: Family Medicine

## 2017-05-07 VITALS — BP 126/69 | HR 88 | Wt 229.4 lb

## 2017-05-07 DIAGNOSIS — F419 Anxiety disorder, unspecified: Secondary | ICD-10-CM

## 2017-05-07 DIAGNOSIS — O24419 Gestational diabetes mellitus in pregnancy, unspecified control: Secondary | ICD-10-CM

## 2017-05-07 DIAGNOSIS — F32A Depression, unspecified: Secondary | ICD-10-CM

## 2017-05-07 DIAGNOSIS — O0990 Supervision of high risk pregnancy, unspecified, unspecified trimester: Secondary | ICD-10-CM

## 2017-05-07 DIAGNOSIS — Z8759 Personal history of other complications of pregnancy, childbirth and the puerperium: Secondary | ICD-10-CM

## 2017-05-07 DIAGNOSIS — F329 Major depressive disorder, single episode, unspecified: Secondary | ICD-10-CM

## 2017-05-07 DIAGNOSIS — O0993 Supervision of high risk pregnancy, unspecified, third trimester: Secondary | ICD-10-CM

## 2017-05-07 LAB — GLUCOSE, CAPILLARY: Glucose-Capillary: 83 mg/dL (ref 65–99)

## 2017-05-07 NOTE — Progress Notes (Signed)
Subjective:  Latoya [name] is a 26 y.o. G2P1001 at 32w4dbeing seen today for ongoing prenatal care.  She is currently monitored for the following issues for this high-risk pregnancy and has GDM (gestational diabetes mellitus); Obesity in pregnancy; Supervision of high risk pregnancy, antepartum; Obesity (BMI 30.0-34.9); History of substance abuse; History of gestational diabetes mellitus (GDM); History of gestational hypertension; History of macrosomia in infant in prior pregnancy, currently pregnant; Short interval between pregnancies affecting pregnancy in second trimester, antepartum; Late prenatal care; Paxil use in early pregnancy; History of suicide attempt; Anxiety and depression; and Cervical dysplasia on her problem list.  GDM: Patient taking glyburide 2.557mat bedtime. Reports no hypoglycemic episodes.  Tolerating medication well Hasn't checked her blood sugar in past week - only checked it 3 times. Misses about half of the doses of glyburide.   Patient reports no complaints.  Contractions: Not present. Vag. Bleeding: None.  Movement: Present. Denies leaking of fluid.   The following portions of the patient's history were reviewed and updated as appropriate: allergies, current medications, past family history, past medical history, past social history, past surgical history and problem list. Problem list updated.  Objective:   Vitals:   05/07/17 1402  BP: 126/69  Pulse: 88  Weight: 229 lb 6.4 oz (104.1 kg)    Fetal Status: Fetal Heart Rate (bpm): NST   Movement: Present     General:  Alert, oriented and cooperative. Patient is in no acute distress.  Skin: Skin is warm and dry. No rash noted.   Cardiovascular: Normal heart rate noted  Respiratory: Normal respiratory effort, no problems with respiration noted  Abdomen: Soft, gravid, appropriate for gestational age. Pain/Pressure: Present     Pelvic: Vag. Bleeding: None     Cervical exam deferred        Extremities: Normal  range of motion.  Edema: None  Mental Status: Normal mood and affect. Normal behavior. Normal judgment and thought content.   Urinalysis:      Assessment and Plan:  Pregnancy: G2P1001 at 371w4d. Supervision of high risk pregnancy, antepartum FHT and FH normal.  2. Gestational diabetes mellitus (GDM) in third trimester, gestational diabetes method of control unspecified EFW 63% (2930g). BPP/NST today. CBG (about 2hrs PP) normal NST reactive. BPP pending.  3. History of gestational hypertension BP controlled.  4. Anxiety and depresseion. Paxil postpartum.  Term labor symptoms and general obstetric precautions including but not limited to vaginal bleeding, contractions, leaking of fluid and fetal movement were reviewed in detail with the patient. Please refer to After Visit Summary for other counseling recommendations.  Return in about 7 days (around 05/14/2017) for NST/BPP, Ob fu.   StiTruett MainlandO

## 2017-05-07 NOTE — Progress Notes (Signed)
Pt states she missed a couple doses of glyburide since last visit because she fell asleep. Pt states she is not checking her blood sugar every Shontez Sermon.

## 2017-05-07 NOTE — Progress Notes (Signed)
Pt informed that the ultrasound is considered a limited OB ultrasound and is not intended to be a complete ultrasound exam.  Patient also informed that the ultrasound is not being completed with the intent of assessing for fetal or placental anomalies or any pelvic abnormalities.  Explained that the purpose of today's ultrasound is to assess for presentation, BPP and amniotic fluid volume.  Patient acknowledges the purpose of the exam and the limitations of the study.

## 2017-05-08 ENCOUNTER — Telehealth (HOSPITAL_COMMUNITY): Payer: Self-pay | Admitting: *Deleted

## 2017-05-08 NOTE — Telephone Encounter (Signed)
Preadmission screen  

## 2017-05-09 ENCOUNTER — Encounter (HOSPITAL_COMMUNITY): Payer: Self-pay | Admitting: *Deleted

## 2017-05-09 ENCOUNTER — Inpatient Hospital Stay (HOSPITAL_COMMUNITY)
Admission: AD | Admit: 2017-05-09 | Discharge: 2017-05-09 | Disposition: A | Discharge status: Home / Self Care | Payer: Medicaid Other | Source: Ambulatory Visit | Attending: Family Medicine | Admitting: Family Medicine

## 2017-05-09 DIAGNOSIS — N898 Other specified noninflammatory disorders of vagina: Secondary | ICD-10-CM | POA: Diagnosis not present

## 2017-05-09 DIAGNOSIS — O471 False labor at or after 37 completed weeks of gestation: Secondary | ICD-10-CM

## 2017-05-09 DIAGNOSIS — O26893 Other specified pregnancy related conditions, third trimester: Secondary | ICD-10-CM | POA: Insufficient documentation

## 2017-05-09 DIAGNOSIS — Z3A37 37 weeks gestation of pregnancy: Secondary | ICD-10-CM | POA: Insufficient documentation

## 2017-05-09 LAB — URINALYSIS, ROUTINE W REFLEX MICROSCOPIC
Bacteria, UA: NONE SEEN
Bilirubin Urine: NEGATIVE
Glucose, UA: NEGATIVE mg/dL
Hgb urine dipstick: NEGATIVE
Ketones, ur: 80 mg/dL — AB
Leukocytes, UA: NEGATIVE
Nitrite: NEGATIVE
Protein, ur: 100 mg/dL — AB
Renal Epithelial: <1
Specific Gravity, Urine: 1.025 (ref 1.005–1.030)
pH: 6 (ref 5.0–8.0)

## 2017-05-09 LAB — RAPID URINE DRUG SCREEN, HOSP PERFORMED
Amphetamines: NOT DETECTED
Barbiturates: NOT DETECTED
Benzodiazepines: NOT DETECTED
Cocaine: NOT DETECTED
Opiates: NOT DETECTED
Tetrahydrocannabinol: NOT DETECTED

## 2017-05-09 MED ORDER — OXYCODONE-ACETAMINOPHEN 5-325 MG PO TABS
1.0000 | ORAL_TABLET | Freq: Once | ORAL | Status: AC
  Administered 2017-05-09: 1 via ORAL

## 2017-05-09 MED ORDER — OXYCODONE-ACETAMINOPHEN 5-325 MG PO TABS
ORAL_TABLET | ORAL | Status: AC
  Filled 2017-05-09: qty 1

## 2017-05-09 NOTE — Discharge Instructions (Signed)

## 2017-05-09 NOTE — MAU Provider Note (Signed)
Chief Complaint:  Contractions and Emesis During Pregnancy   Pt seen at 0100hrs    I was asked to see pt to rule out rupture of membranes.   HPI: Latoya [name] is a 26 y.o. G2P1001 at 55w6dho presents to maternity admissions reporting leaking fluid when vomiting. Has had some vomiting today, did not take anything.  . She reports good fetal movement, denies LOF, vaginal bleeding, vaginal itching/burning, urinary symptoms, h/a, dizziness, n/v, diarrhea, constipation or fever/chills.  She denies headache, visual changes or RUQ abdominal pain.  Vaginal Discharge  The patient's primary symptoms include pelvic pain and vaginal discharge. The patient's pertinent negatives include no genital itching, genital lesions, genital odor or vaginal bleeding. This is a new problem. The current episode started today. The problem occurs rarely. The problem has been unchanged. The pain is mild. She is pregnant. Associated symptoms include abdominal pain and back pain. Pertinent negatives include no chills, constipation, diarrhea, fever, headaches, nausea or vomiting. The vaginal discharge was clear and watery. There has been no bleeding. She has not been passing clots. She has not been passing tissue. Nothing aggravates the symptoms. She has tried nothing for the symptoms.    RN Note: Contractions in abd and back - started an hour ago.  Vomiting started first, total of 5 times since 7:30 pm. ? Leaking- Clear fluid coming out when vomited few min before arriving here. No bleeding. Baby moving well.    Past Medical History: Past Medical History:  Diagnosis Date  . Anemia   . Anxiety   . Depression   . Gestational diabetes   . History of substance abuse   . Pregnancy induced hypertension     Past obstetric history: OB History  Gravida Para Term Preterm AB Living  2 1 1  0 0 1  SAB TAB Ectopic Multiple Live Births  0 0 0 0 1    # Outcome Date GA Lbr Len/2nd Weight Sex Delivery Anes PTL Lv  2 Current            1 Term 03/27/16 363w2d3:44 / 02:35 9 lb 2.4 oz (4.15 kg) M Vag-Spont EPI  LIV      Past Surgical History: Past Surgical History:  Procedure Laterality Date  . DEEP NECK LYMPH NODE BIOPSY / EXCISION  2011    Family History: Family History  Problem Relation Age of Onset  . Diabetes Mother   . Hypertension Mother   . Hypertension Father   . Stroke Maternal Grandfather     Social History: Social History  Substance Use Topics  . Smoking status: Never Smoker  . Smokeless tobacco: Never Used  . Alcohol use No     Comment: "not for a while"    Allergies: No Known Allergies  Meds:  No prescriptions prior to admission.    I have reviewed patient's Past Medical Hx, Surgical Hx, Family Hx, Social Hx, medications and allergies.   ROS:  Review of Systems  Constitutional: Negative for chills and fever.  Gastrointestinal: Positive for abdominal pain. Negative for constipation, diarrhea, nausea and vomiting.  Genitourinary: Positive for pelvic pain and vaginal discharge.  Musculoskeletal: Positive for back pain.  Neurological: Negative for headaches.   Other systems negative  Physical Exam  Patient Vitals for the past 24 hrs:  BP Temp Temp src Pulse Resp SpO2 Height Weight  05/09/17 0214 (!) 101/57 98.5 F (36.9 C) Oral (!) 109 (!) 21 - - -  05/09/17 0040 113/82 98.8 F (37.1 C) Oral (!Marland Kitchen  118 18 100 % 5' 9"  (1.753 m) 227 lb (103 kg)   Constitutional: Well-developed, well-nourished female in no acute distress.  Cardiovascular: normal rate and rhythm Respiratory: normal effort, clear to auscultation bilaterally GI: Abd soft, non-tender, gravid appropriate for gestational age.   No rebound or guarding. MS: Extremities nontender, no edema, normal ROM Neurologic: Alert and oriented x 4.  GU: Neg CVAT.  PELVIC EXAM: Cervix pink, visually closed, without lesion, scant white creamy discharge, vaginal walls and external genitalia normal     NO POOLING< NO FERNING Bimanual  exam: Cervix firm, posterior, neg CMT, uterus nontender, Fundal Height consistent with dates, adnexa without tenderness, enlargement, or mass  Dilation: Fingertip Effacement (%): 50 Station: Amery, -3 Presentation: Vertex Exam by:: Mionna Advincula cnm   FHT:  Baseline 140 , moderate variability, accelerations present, no decelerations Contractions: Irregular     Labs: Results for orders placed or performed during the hospital encounter of 05/09/17 (from the past 24 hour(s))  Urinalysis, Routine w reflex microscopic     Status: Abnormal   Collection Time: 05/09/17 12:43 AM  Result Value Ref Range   Color, Urine YELLOW YELLOW   APPearance HAZY (A) CLEAR   Specific Gravity, Urine 1.025 1.005 - 1.030   pH 6.0 5.0 - 8.0   Glucose, UA NEGATIVE NEGATIVE mg/dL   Hgb urine dipstick NEGATIVE NEGATIVE   Bilirubin Urine NEGATIVE NEGATIVE   Ketones, ur 80 (A) NEGATIVE mg/dL   Protein, ur 100 (A) NEGATIVE mg/dL   Nitrite NEGATIVE NEGATIVE   Leukocytes, UA NEGATIVE NEGATIVE   RBC / HPF 0-5 0 - 5 RBC/hpf   WBC, UA 0-5 0 - 5 WBC/hpf   Bacteria, UA NONE SEEN NONE SEEN   Squamous Epithelial / LPF 0-5 (A) NONE SEEN   Renal Epithelial <1    Mucous PRESENT   Urine rapid drug screen (hosp performed)     Status: None   Collection Time: 05/09/17 12:43 AM  Result Value Ref Range   Opiates NONE DETECTED NONE DETECTED   Cocaine NONE DETECTED NONE DETECTED   Benzodiazepines NONE DETECTED NONE DETECTED   Amphetamines NONE DETECTED NONE DETECTED   Tetrahydrocannabinol NONE DETECTED NONE DETECTED   Barbiturates NONE DETECTED NONE DETECTED   O/Positive/-- (04/18 1355)  Imaging:   MAU Course/MDM: I have ordered labs and reviewed results. Patient had been restless and could not sit still, so we sent a drug screen which was negative. Does not appear to be uncomfortable while I was in room.   NST reviewed and found to be reactive, category I  Assessment: 1. False labor after 37 completed weeks of  gestation   2. Vaginal discharge during pregnancy in third trimester     Plan: Discharge home Labor precautions and fetal kick counts Follow up in Office for prenatal visits and recheck of status Encouraged to return here or to other Urgent Care/ED if she develops worsening of symptoms, increase in pain, fever, or other concerning symptoms.     Follow-up Information    [facility].   Contact information: [place] [phone]          Pt stable at time of discharge.  Hansel Feinstein CNM, MSN Certified Nurse-Midwife 05/09/2017 4:00 AM

## 2017-05-09 NOTE — MAU Note (Signed)
Contractions in abd and back - started an hour ago.  Vomiting started first, total of 5 times since 7:30 pm. ? Leaking- Clear fluid coming out when vomited few min before arriving here. No bleeding. Baby moving well.

## 2017-05-14 ENCOUNTER — Encounter: Payer: Self-pay | Admitting: Obstetrics and Gynecology

## 2017-05-14 ENCOUNTER — Other Ambulatory Visit: Payer: Self-pay

## 2017-05-15 ENCOUNTER — Encounter: Payer: Self-pay | Admitting: Obstetrics and Gynecology

## 2017-05-15 ENCOUNTER — Inpatient Hospital Stay (HOSPITAL_COMMUNITY)
Admission: AD | Admit: 2017-05-15 | Discharge: 2017-05-17 | DRG: 775 | Disposition: A | Discharge status: Home / Self Care | Payer: Medicaid Other | Source: Ambulatory Visit | Attending: Family Medicine | Admitting: Family Medicine

## 2017-05-15 ENCOUNTER — Inpatient Hospital Stay (HOSPITAL_COMMUNITY): Payer: Medicaid Other | Admitting: Anesthesiology

## 2017-05-15 ENCOUNTER — Encounter (HOSPITAL_COMMUNITY): Payer: Self-pay

## 2017-05-15 DIAGNOSIS — Z6833 Body mass index (BMI) 33.0-33.9, adult: Secondary | ICD-10-CM

## 2017-05-15 DIAGNOSIS — O99214 Obesity complicating childbirth: Secondary | ICD-10-CM | POA: Diagnosis present

## 2017-05-15 DIAGNOSIS — O9902 Anemia complicating childbirth: Secondary | ICD-10-CM | POA: Diagnosis present

## 2017-05-15 DIAGNOSIS — Z3A38 38 weeks gestation of pregnancy: Secondary | ICD-10-CM | POA: Diagnosis not present

## 2017-05-15 DIAGNOSIS — F53 Puerperal psychosis: Secondary | ICD-10-CM | POA: Diagnosis not present

## 2017-05-15 DIAGNOSIS — O24425 Gestational diabetes mellitus in childbirth, controlled by oral hypoglycemic drugs: Secondary | ICD-10-CM | POA: Diagnosis present

## 2017-05-15 DIAGNOSIS — F1121 Opioid dependence, in remission: Secondary | ICD-10-CM | POA: Diagnosis not present

## 2017-05-15 DIAGNOSIS — D649 Anemia, unspecified: Secondary | ICD-10-CM | POA: Diagnosis present

## 2017-05-15 DIAGNOSIS — F1490 Cocaine use, unspecified, uncomplicated: Secondary | ICD-10-CM | POA: Diagnosis not present

## 2017-05-15 DIAGNOSIS — Z3493 Encounter for supervision of normal pregnancy, unspecified, third trimester: Secondary | ICD-10-CM | POA: Diagnosis present

## 2017-05-15 LAB — TYPE AND SCREEN
ABO/RH(D): O POS
Antibody Screen: NEGATIVE

## 2017-05-15 LAB — CBC
HCT: 33.4 % — ABNORMAL LOW (ref 36.0–46.0)
Hemoglobin: 11.4 g/dL — ABNORMAL LOW (ref 12.0–15.0)
MCH: 27 pg (ref 26.0–34.0)
MCHC: 34.1 g/dL (ref 30.0–36.0)
MCV: 79.1 fL (ref 78.0–100.0)
Platelets: 300 10*3/uL (ref 150–400)
RBC: 4.22 MIL/uL (ref 3.87–5.11)
RDW: 14.3 % (ref 11.5–15.5)
WBC: 7.5 10*3/uL (ref 4.0–10.5)

## 2017-05-15 LAB — RAPID URINE DRUG SCREEN, HOSP PERFORMED
Amphetamines: NOT DETECTED
Barbiturates: NOT DETECTED
Benzodiazepines: NOT DETECTED
Cocaine: NOT DETECTED
Opiates: NOT DETECTED
Tetrahydrocannabinol: NOT DETECTED

## 2017-05-15 LAB — GLUCOSE, CAPILLARY: Glucose-Capillary: 104 mg/dL — ABNORMAL HIGH (ref 65–99)

## 2017-05-15 MED ORDER — PHENYLEPHRINE 40 MCG/ML (10ML) SYRINGE FOR IV PUSH (FOR BLOOD PRESSURE SUPPORT): 80.0000 ug | PREFILLED_SYRINGE | INTRAVENOUS | Status: DC | PRN

## 2017-05-15 MED ORDER — COCONUT OIL OIL: 1.0000 "application " | TOPICAL_OIL | Status: DC | PRN

## 2017-05-15 MED ORDER — BENZOCAINE-MENTHOL 20-0.5 % EX AERO
1.0000 "application " | INHALATION_SPRAY | CUTANEOUS | Status: DC | PRN
  Filled 2017-05-15: qty 56

## 2017-05-15 MED ORDER — LIDOCAINE HCL (PF) 1 % IJ SOLN
30.0000 mL | INTRAMUSCULAR | Status: DC | PRN
  Filled 2017-05-15: qty 30

## 2017-05-15 MED ORDER — FLEET ENEMA 7-19 GM/118ML RE ENEM: 1.0000 | ENEMA | RECTAL | Status: DC | PRN

## 2017-05-15 MED ORDER — SIMETHICONE 80 MG PO CHEW: 80.0000 mg | CHEWABLE_TABLET | ORAL | Status: DC | PRN

## 2017-05-15 MED ORDER — OXYTOCIN BOLUS FROM INFUSION
500.0000 mL | Freq: Once | INTRAVENOUS | Status: AC
  Administered 2017-05-15: 500 mL via INTRAVENOUS

## 2017-05-15 MED ORDER — LACTATED RINGERS IV SOLN
INTRAVENOUS | Status: DC
  Administered 2017-05-15: 13:00:00 via INTRAVENOUS

## 2017-05-15 MED ORDER — WITCH HAZEL-GLYCERIN EX PADS: 1.0000 "application " | MEDICATED_PAD | CUTANEOUS | Status: DC | PRN

## 2017-05-15 MED ORDER — PHENYLEPHRINE 40 MCG/ML (10ML) SYRINGE FOR IV PUSH (FOR BLOOD PRESSURE SUPPORT)
80.0000 ug | PREFILLED_SYRINGE | INTRAVENOUS | Status: DC | PRN
  Filled 2017-05-15: qty 10

## 2017-05-15 MED ORDER — LIDOCAINE HCL (PF) 1 % IJ SOLN
INTRAMUSCULAR | Status: DC | PRN
  Administered 2017-05-15 (×2): 5 mL via EPIDURAL

## 2017-05-15 MED ORDER — ONDANSETRON HCL 4 MG/2ML IJ SOLN: 4.0000 mg | INTRAMUSCULAR | Status: DC | PRN

## 2017-05-15 MED ORDER — SENNOSIDES-DOCUSATE SODIUM 8.6-50 MG PO TABS
2.0000 | ORAL_TABLET | ORAL | Status: DC
  Administered 2017-05-16 (×2): 2 via ORAL
  Filled 2017-05-15 (×2): qty 2

## 2017-05-15 MED ORDER — TETANUS-DIPHTH-ACELL PERTUSSIS 5-2.5-18.5 LF-MCG/0.5 IM SUSP
0.5000 mL | Freq: Once | INTRAMUSCULAR | Status: AC
  Administered 2017-05-17: 0.5 mL via INTRAMUSCULAR
  Filled 2017-05-15: qty 0.5

## 2017-05-15 MED ORDER — OXYTOCIN 10 UNIT/ML IJ SOLN
INTRAMUSCULAR | Status: AC
  Administered 2017-05-15: 10 [IU]
  Filled 2017-05-15: qty 1

## 2017-05-15 MED ORDER — EPHEDRINE 5 MG/ML INJ: 10.0000 mg | INTRAVENOUS | Status: DC | PRN

## 2017-05-15 MED ORDER — ONDANSETRON HCL 4 MG/2ML IJ SOLN: 4.0000 mg | Freq: Four times a day (QID) | INTRAMUSCULAR | Status: DC | PRN

## 2017-05-15 MED ORDER — IBUPROFEN 600 MG PO TABS
600.0000 mg | ORAL_TABLET | Freq: Four times a day (QID) | ORAL | Status: DC
  Administered 2017-05-15 – 2017-05-17 (×8): 600 mg via ORAL
  Filled 2017-05-15 (×7): qty 1

## 2017-05-15 MED ORDER — DIPHENHYDRAMINE HCL 50 MG/ML IJ SOLN: 12.5000 mg | INTRAMUSCULAR | Status: DC | PRN

## 2017-05-15 MED ORDER — ACETAMINOPHEN 325 MG PO TABS: 650.0000 mg | ORAL_TABLET | ORAL | Status: DC | PRN

## 2017-05-15 MED ORDER — OXYTOCIN 40 UNITS IN LACTATED RINGERS INFUSION - SIMPLE MED
2.5000 [IU]/h | INTRAVENOUS | Status: DC
  Filled 2017-05-15: qty 1000

## 2017-05-15 MED ORDER — LACTATED RINGERS IV BOLUS (SEPSIS)
1000.0000 mL | Freq: Once | INTRAVENOUS | Status: AC
  Administered 2017-05-15: 1000 mL via INTRAVENOUS

## 2017-05-15 MED ORDER — OXYCODONE-ACETAMINOPHEN 5-325 MG PO TABS: 2.0000 | ORAL_TABLET | ORAL | Status: DC | PRN

## 2017-05-15 MED ORDER — PRENATAL MULTIVITAMIN CH
1.0000 | ORAL_TABLET | Freq: Every day | ORAL | Status: DC
  Administered 2017-05-16: 1 via ORAL
  Filled 2017-05-15: qty 1

## 2017-05-15 MED ORDER — OXYCODONE-ACETAMINOPHEN 5-325 MG PO TABS
1.0000 | ORAL_TABLET | ORAL | Status: DC | PRN
  Administered 2017-05-15: 1 via ORAL
  Filled 2017-05-15: qty 1

## 2017-05-15 MED ORDER — PAROXETINE HCL ER 25 MG PO TB24
25.0000 mg | ORAL_TABLET | Freq: Every day | ORAL | Status: DC
  Administered 2017-05-16 – 2017-05-17 (×2): 25 mg via ORAL
  Filled 2017-05-15 (×2): qty 1

## 2017-05-15 MED ORDER — ONDANSETRON HCL 4 MG PO TABS: 4.0000 mg | ORAL_TABLET | ORAL | Status: DC | PRN

## 2017-05-15 MED ORDER — FENTANYL 2.5 MCG/ML BUPIVACAINE 1/10 % EPIDURAL INFUSION (WH - ANES)
14.0000 mL/h | INTRAMUSCULAR | Status: DC | PRN
  Administered 2017-05-15: 14.5 mL/h via EPIDURAL
  Filled 2017-05-15 (×2): qty 100

## 2017-05-15 MED ORDER — DIBUCAINE 1 % RE OINT: 1.0000 "application " | TOPICAL_OINTMENT | RECTAL | Status: DC | PRN

## 2017-05-15 MED ORDER — LACTATED RINGERS IV SOLN: 500.0000 mL | INTRAVENOUS | Status: DC | PRN

## 2017-05-15 MED ORDER — DIPHENHYDRAMINE HCL 25 MG PO CAPS
25.0000 mg | ORAL_CAPSULE | Freq: Four times a day (QID) | ORAL | Status: DC | PRN
  Administered 2017-05-16: 25 mg via ORAL
  Filled 2017-05-15: qty 1

## 2017-05-15 MED ORDER — SOD CITRATE-CITRIC ACID 500-334 MG/5ML PO SOLN: 30.0000 mL | ORAL | Status: DC | PRN

## 2017-05-15 MED ORDER — ZOLPIDEM TARTRATE 5 MG PO TABS: 5.0000 mg | ORAL_TABLET | Freq: Every evening | ORAL | Status: DC | PRN

## 2017-05-15 MED ORDER — LACTATED RINGERS IV SOLN: 500.0000 mL | Freq: Once | INTRAVENOUS | Status: DC

## 2017-05-15 NOTE — Progress Notes (Signed)
[facility] called and requested to come to room.  Pt trying to get out of bed and screaming uncontrollably.

## 2017-05-15 NOTE — Anesthesia Postprocedure Evaluation (Signed)
Anesthesia Post Note  Patient: Latoya [name]  Procedure(s) Performed: * No procedures listed *     Patient location during evaluation: Mother Baby Anesthesia Type: Epidural Level of consciousness: awake and alert and oriented Pain management: pain level controlled Vital Signs Assessment: post-procedure vital signs reviewed and stable Respiratory status: spontaneous breathing and nonlabored ventilation Cardiovascular status: stable Postop Assessment: no headache, patient able to bend at knees, no backache, no signs of nausea or vomiting, epidural receding and adequate PO intake Anesthetic complications: no    Last Vitals:  Vitals:   05/15/17 1540 05/15/17 1615  BP: (!) 133/57 (!) 119/55  Pulse: 84 93  Resp: 16   Temp: 36.8 C     Last Pain:  Vitals:   05/15/17 1640  TempSrc:   PainSc: 0-No pain   Pain Goal:                 [employer]

## 2017-05-15 NOTE — Progress Notes (Signed)
Patient did not keep OB appointment for 05/14/2017.  Durene Romans MD Attending Center for [employer] farm manager)

## 2017-05-15 NOTE — Progress Notes (Addendum)
G2P1 @ 38.[redacted] wksga. Presents to triage for r/o labor. Denies LOF or bleedng. EFM. +FM. applied. SVE 7-8 with BB. GBS negataive (Aug1)  1200: Provider notified. Report status of pt given. Orders received to admit  1214: Birthing charge nurse notified. Will call back to receive report.   1221: Labs and IV started.  1230:  Pt to birthing via wheelchair taken by RN tech to room 176.

## 2017-05-15 NOTE — Anesthesia Preprocedure Evaluation (Addendum)
Anesthesia Evaluation  Patient identified by MRN, date of birth, ID band Patient awake    Reviewed: Allergy & Precautions, NPO status , Patient's Chart, lab work & pertinent test results  Airway Mallampati: II  TM Distance: >3 FB Neck ROM: Full    Dental no notable dental hx. (+) Teeth Intact   Pulmonary neg pulmonary ROS,    Pulmonary exam normal breath sounds clear to auscultation       Cardiovascular hypertension, negative cardio ROS Normal cardiovascular exam Rhythm:Regular Rate:Normal     Neuro/Psych PSYCHIATRIC DISORDERS Anxiety Depression negative neurological ROS     GI/Hepatic Neg liver ROS, GERD  Controlled and Medicated,  Endo/Other  diabetes, Well Controlled, Gestational, Oral Hypoglycemic AgentsObesity  Renal/GU negative Renal ROS  negative genitourinary   Musculoskeletal negative musculoskeletal ROS (+)   Abdominal (+) + obese,   Peds negative pediatric ROS (+)  Hematology  (+) anemia ,   Anesthesia Other Findings   Reproductive/Obstetrics negative OB ROS                             Anesthesia Physical  Anesthesia Plan  ASA: II  Anesthesia Plan: Epidural   Post-op Pain Management:    Induction: Intravenous  PONV Risk Score and Plan:   Airway Management Planned: Natural Airway  Additional Equipment:   Intra-op Plan: Utilization Of Total Body Hypothermia per surgeon request  Post-operative Plan:   Informed Consent: I have reviewed the patients History and Physical, chart, labs and discussed the procedure including the risks, benefits and alternatives for the proposed anesthesia with the patient or authorized representative who has indicated his/her understanding and acceptance.   Dental advisory given  Plan Discussed with: CRNA, Anesthesiologist and Surgeon  Anesthesia Plan Comments: (Informed consent obtained prior to proceeding including risk of failure,  1% risk of PDPH, risk of minor discomfort and bruising.  Discussed rare but serious complications including epidural abscess, permanent nerve injury, epidural hematoma.  Discussed alternatives to epidural analgesia and patient desires to proceed.  Timeout performed pre-procedure verifying patient name, procedure, and platelet count.  )        Anesthesia Quick Evaluation

## 2017-05-15 NOTE — Anesthesia Procedure Notes (Signed)
Epidural Patient location during procedure: OB Start time: 05/15/2017 1:36 PM  Staffing Anesthesiologist: Josephine Igo  Preanesthetic Checklist Completed: patient identified, site marked, surgical consent, pre-op evaluation, timeout performed, IV checked, risks and benefits discussed and monitors and equipment checked  Epidural Patient position: sitting Prep: site prepped and draped and DuraPrep Patient monitoring: continuous pulse ox and blood pressure Approach: midline Location: L3-L4 Injection technique: LOR air  Needle:  Needle type: Tuohy  Needle gauge: 17 G Needle length: 9 cm and 9 Needle insertion depth: 6 cm Catheter type: closed end flexible Catheter size: 19 Gauge Catheter at skin depth: 11 cm Test dose: negative and Other  Assessment Events: blood not aspirated, injection not painful, no injection resistance, negative IV test and no paresthesia  Additional Notes Patient identified. Risks and benefits discussed including failed block, incomplete  Pain control, post dural puncture headache, nerve damage, paralysis, blood pressure Changes, nausea, vomiting, reactions to medications-both toxic and allergic and post Partum back pain. All questions were answered. Patient expressed understanding and wished to proceed. Sterile technique was used throughout procedure. Epidural site was Dressed with sterile barrier dressing. No paresthesias, signs of intravascular injection Or signs of intrathecal spread were encountered. Attempt x 2 Patient was more comfortable after the epidural was dosed. Please see RN's note for documentation of vital signs and FHR which are stable.

## 2017-05-15 NOTE — Anesthesia Pain Management Evaluation Note (Signed)
  CRNA Pain Management Visit Note  Patient: Latoya [name], 26 y.o., female  "Hello I am a member of the anesthesia team at [facility]. We have an anesthesia team available at all times to provide care throughout the hospital, including epidural management and anesthesia for C-section. I don't know your plan for the delivery whether it a natural birth, water birth, IV sedation, nitrous supplementation, doula or epidural, but we want to meet your pain goals."   1.Was your pain managed to your expectations on prior hospitalizations?   Yes   2.What is your expectation for pain management during this hospitalization?     Epidural  3.How can we help you reach that goal? Epidural. L&D RN in room and aware of patient's desire for epidural.  Record the patient's initial score and the patient's pain goal.   Pain: 8  Pain Goal: 5 [facility] wants you to be able to say your pain was always managed very well.  Yasemin Rabon L 05/15/2017

## 2017-05-15 NOTE — H&P (Signed)
LABOR ADMISSION HISTORY AND PHYSICAL  Latoya [name] is a 26 y.o. female G2P1001 with IUP at 60w5dby early U/S presenting for SOL. She reports +FMs, No LOF, no VB, no blurry vision, no headaches, no peripheral edema, and no RUQ pain.  She plans on formula feeding. She requests IUD for birth control.  Dating: By early u/s --->  Estimated Date of Delivery: 05/24/17  Sono:   @[redacted]w[redacted]d , CWD, normal anatomy, cephalic presentation, 28413K 63% EFW  Prenatal History/Complications: [id number] of substance abuse in previous pregnancy, no positive drug screens during current pregnancy Hx of postpartum depression after previous pregnancy, treated with Paxil  Past Medical History: Past Medical History:  Diagnosis Date  . Anemia   . Anxiety   . Depression   . Gestational diabetes   . History of substance abuse   . Pregnancy induced hypertension     Past Surgical History: Past Surgical History:  Procedure Laterality Date  . DEEP NECK LYMPH NODE BIOPSY / EXCISION  2011    Obstetrical History: OB History    Gravida Para Term Preterm AB Living   2 1 1  0 0 1   SAB TAB Ectopic Multiple Live Births   0 0 0 0 1      Social History: Social History   Social History  . Marital status: Single    Spouse name: N/A  . Number of children: N/A  . Years of education: N/A   Social History Main Topics  . Smoking status: Never Smoker  . Smokeless tobacco: Never Used  . Alcohol use No     Comment: "not for a while"  . Drug use: No     Comment: last used 4 months ago  . Sexual activity: Not Currently    Birth control/ protection: None   Other Topics Concern  . None   Social History Narrative  . None    Family History: Family History  Problem Relation Age of Onset  . Diabetes Mother   . Hypertension Mother   . Hypertension Father   . Stroke Maternal Grandfather     Allergies: No Known Allergies  Prescriptions Prior to Admission  Medication Sig Dispense Refill Last Dose  .  acetaminophen (TYLENOL) 500 MG tablet Take 1,000 mg by mouth every 6 (six) hours as needed for mild pain, moderate pain, fever or headache.    05/09/2017 at Unknown time  . diphenhydrAMINE (BENADRYL) 25 MG tablet Take 50 mg by mouth at bedtime as needed for sleep.   Unknown at Unknown time  . diphenhydramine-acetaminophen (TYLENOL PM) 25-500 MG TABS tablet Take 1 tablet by mouth at bedtime as needed.   05/09/2017 at Unknown time  . glyBURIDE (DIABETA) 2.5 MG tablet Take 1 tablet (2.5 mg total) by mouth at bedtime. 30 tablet 3 05/08/2017 at Unknown time  . ondansetron (ZOFRAN) 4 MG tablet Take 1 tablet (4 mg total) by mouth every 8 (eight) hours as needed for nausea or vomiting. (Patient not taking: Reported on 04/25/2017) 20 tablet 0 Not Taking  . Prenatal Vit-Fe Fumarate-FA (PRENATAL MULTIVITAMIN) TABS tablet Take 1 tablet by mouth daily.    05/09/2017 at Unknown time     Review of Systems   All systems reviewed and negative except as stated in HPI  Blood pressure 132/74, pulse 92, temperature 98 F (36.7 C), temperature source Oral, resp. rate 18, height 5' 9"  (1.753 m), weight 103 kg (227 lb), last menstrual period 07/28/2016, not currently breastfeeding. General appearance: alert, cooperative, appears stated  age, moderate distress and mildly obese Lungs: normal work of breathing Extremities: Homans sign is negative, no sign of DVT Presentation: cephalic Fetal monitoringBaseline: 130 bpm Uterine activityFrequency: Every 1-2 minutes Dilation: 7.5 Station: 0 Exam by:: J. Genevie Cheshire, RN   Prenatal labs: ABO, Rh: O/Positive/-- (04/18 1355) Antibody: Negative (04/18 1355) Rubella: 1.86 RPR: Non Reactive (06/12 0914)  HBsAg: Negative (04/18 1355)  HIV:   negative GBS:   negative GTT: third trimester abnormal - A2GDM  Prenatal Transfer Tool  Maternal Diabetes: Yes:  Diabetes Type:  Insulin/Medication controlled Genetic Screening: Normal Maternal Ultrasounds/Referrals: Normal Fetal  Ultrasounds or other Referrals:  None Maternal Substance Abuse:  No, although Hx of cocaine and opioid use in previous pregnancy Significant Maternal Medications:  Meds include: Other: Gyburide Significant Maternal Lab Results: None  No results found for this or any previous visit (from the past 24 hour(s)).  Patient Active Problem List   Diagnosis Date Noted  . Pregnancy 05/15/2017  . Cervical dysplasia 01/16/2017  . Obesity (BMI 30.0-34.9) 01/12/2017  . History of substance abuse 01/12/2017  . History of gestational diabetes mellitus (GDM) 01/12/2017  . History of gestational hypertension 01/12/2017  . History of macrosomia in infant in prior pregnancy, currently pregnant 01/12/2017  . Short interval between pregnancies affecting pregnancy in second trimester, antepartum 01/12/2017  . Late prenatal care 01/12/2017  . Paxil use in early pregnancy 01/12/2017  . History of suicide attempt 01/12/2017  . Anxiety and depression 01/12/2017  . Supervision of high risk pregnancy, antepartum 01/10/2017  . GDM (gestational diabetes mellitus) 02/25/2016  . Obesity in pregnancy 02/25/2016    Assessment: Latoya [name] is a 26 y.o. G2P1001 at 39w5dhere for SOL.  #Labor: no augmentation needed #Pain: Epidural placed #FWB: Cat I #ID: GBS neg #MOF: bottle #MOC: IUD #Circ:  N/A (female)  AMaia Breslow MD Family Medicine Resident PGY-1  05/15/2017, 12:52 PM  I confirm that I have verified the information documented in the resident's note and that I have also personally reperformed the physical exam and all medical decision making activities.  WSeabron Spates CNM

## 2017-05-15 NOTE — Progress Notes (Signed)
Brief Progress Note  Notified by RN of patient's high PHQ-9 score (21). Per RN report, patient also not bonding well with baby. SW consult has been placed.  In to speak with patient. She endorses depressive symptoms. She is tearful when talking about symptoms. She has h/o of SI and suicide attempt in the past, but she currently denies any active SI or HI. She reports having support at home, her mother, who is currently taking care of her 13-monthold son. She also reports h/o substance abuse, and reports she quit this pregnancy and plans to stay away from triggers (like FOB). She reports being on Paxil before for depression and this did help her, and she is interested in re-starting this.  Paxil restarted. SW consult pending Consider Behavioral health/psych consult in the morning. Monitor closely for SI/HI.   JAlmyra FreeP. Klint Lezcano, MD OB Fellow

## 2017-05-16 DIAGNOSIS — F1121 Opioid dependence, in remission: Secondary | ICD-10-CM

## 2017-05-16 DIAGNOSIS — F53 Puerperal psychosis: Secondary | ICD-10-CM

## 2017-05-16 DIAGNOSIS — F1490 Cocaine use, unspecified, uncomplicated: Secondary | ICD-10-CM

## 2017-05-16 LAB — RPR: RPR Ser Ql: NONREACTIVE

## 2017-05-16 MED ORDER — PAROXETINE HCL ER 25 MG PO TB24: 25.0000 mg | ORAL_TABLET | Freq: Every day | ORAL | 12 refills | Status: DC

## 2017-05-16 MED ORDER — IBUPROFEN 600 MG PO TABS: 600.0000 mg | ORAL_TABLET | Freq: Four times a day (QID) | ORAL | 0 refills | Status: DC

## 2017-05-16 NOTE — Clinical Social Work Maternal (Signed)
CLINICAL SOCIAL WORK MATERNAL/CHILD NOTE  Patient Details  Name: Latoya [name] MRN: [id number] Date of Birth: September 29, 1990  Date:  05/16/2017  Clinical Social Worker Initiating Note:  Laurey Arrow Date/ Time Initiated:  05/16/17/1230     Child's Name:  Latoya [name]   Legal Guardian:  Mother (FOB Latoya [name] 07/08/63)   Need for Interpreter:  None   Date of Referral:  05/15/17     Reason for Referral:  Behavioral Health Issues, including SI , Current Substance Use/Substance Use During Pregnancy , Late or No Prenatal Care    Referral Source:  [facility]   Address:  [place]  [address]  Phone number:  [phone]   Household Members:  Self, Minor Children, Siblings, Parents (MOB's oldest son is Latoya [name] 03/27/16)   Natural Supports (not living in the home):  Immediate Family, Extended Family, Spouse/significant other   Professional Supports: Therapist (MOB will therapist is Latoya [name] at the [employer]. )   Employment: Unemployed   Type of Work:     Education:  Database administrator Resources:  [employer]   Other Resources:  [employer], Physicist, medical    Cultural/Religious Considerations Which May Impact Care:  MOB attends [employer].  Strengths:  Ability to meet basic needs , Pediatrician chosen , Home prepared for child , Understanding of illness, Compliance with medical plan    Risk Factors/Current Problems:  Substance Use , Mental Health Concerns    Cognitive State:  Able to Concentrate , Alert , Linear Thinking , Insightful    Mood/Affect:  Sad , Tearful , Interested , Comfortable    CSW Assessment: CSW met with MOB to complete an assessment for MH hx and SA hx.  When CSW arrived, MOB was attaching and bonding with infant as evident by MOB engaging in skin to skin. MOB appeared comfortable and was appropriate with infant during the  assessment. MOB was soften-spoken, honest, and receptive  to meeting with CSW.    CSW asked about MOB limited PNC and SA hx and MOB was forthcoming.  MOB disclosed that MOB was reluctant to receive [facility] because MOB was in active addiction.  MOB reported using crack/cocaine(last use April 2018) and reported MOB's last use of snorting heroin was  October 2017. MOB stated that FOB introduced MOB to both crack and heroin, however he is currently in recovery. CSW thanked MOB for being honest and encouraged MOB to continue towards sobriety.  MOB became tearful and communicated feelings of guilt for not discontinuing the use of illicit substance during pregnancy. CSW validated and normalized MOB thoughts and feelings and processed MOB's triggers. MOB reported that prior to laboring, MOB had dreams of smoking crack and that was a trigger for MOB. MOB denied being in a current SA program but was receptive to a referral from [place].  CSW contacted the [facility] and MOB has an appointment with Latoya [name] on Monday (05/21/17) at 1:45pm. CSW explained the hospital's limited [facility] policy; MOB understood. CSW made MOB aware that infant's UDS was negative and CSW will continue to monitor infant's CDS.  CSW informed MOB that if infant's CDS is positive without an explanation, CSW will make a report to [facility] CPS. MOB denied CPS hx. MOB was also receptive to a referral to the [employer]; CSW made a referral to [facility], Latoya [name].  MOB asked CSW about MOB's MH hx.  MOB reported a hx of anxiety  prior to pregnancy and reported that MOB experienced PPD after MOB's first child.  MOB reported feeling overwhelmed, daily crying, sad, and social isolation as it related to PPD. MOB is currently taking Paxil,however feels like the medication is not working. CSW assessed for safety and MOB denied SI, HI, and DV.  MOB stated "I will never hurt my children." CSW provided education regarding Baby Blues vs PMADs and provided MOB with information about support groups  held at [place] encouraged MOB to evaluate her mental health throughout the postpartum period with the use of the New Mom Checklist developed by Postpartum Progress and notify a medical professional if symptoms arise.  MOB demonstrated insight and awareness and was praised for communicating her desire to seek help if help is warranted. MOB communicated MOB's main source of support is MOB's mother, Latoya [name], and reported that MOB's mother is aware of MOB's SA and MH hx.  MOB provided CSW with permission to speak with MOB's mother openly about MOB's dx and resources.   When MOB's mother arrived at the hospital, MOB called CSW to come and meet with them.  CSW provided MOB's mother an update and educated her regarding signs and symptoms to look for regarding MOB's MH.  MOB mother was supportive and communicated a desire for MOB to be the best mother she could be. MOB and MOB's reported having all necessary items for infant.    CSW thanked MOB for meeting with CSW and provided MOB with CSW's contact information.   CSW Plan/Description:  Patient/Family Education , No Further Intervention Required/No Barriers to Discharge (CSW will monitor infant's CDS and will make a report to CPS if warranted. )   Laurey Arrow, MSW, LCSW Clinical Social Work [phone]  Dimple Nanas, LCSW 05/16/2017, 12:51 PM

## 2017-05-16 NOTE — Progress Notes (Signed)
8/1 NST reviewed and reactive

## 2017-05-16 NOTE — Plan of Care (Addendum)
Problem: Role Relationship: Goal: Ability to demonstrate positive interaction with newborn will improve Outcome: Not Progressing Received report in shift hand-off about pt's behavior in L&D and delayed bonding with baby. Contacted doctor Winfrey to get more details about behavior in L&D and it was described as a "dissociative" episode. Collaborated with doctors, charge nurse, AD, and house coverage to ensure plan for baby and mother's safety, as it was not completely known what had triggered the episode. It was decided to place baby in the nursery while pt was sleeping or feeling overwhelmed. Baby taken to nursery around 2030 for pt to rest.   I assessed the pt's [place] Score around 2200, and it was 21. I alerted charge, house coverage, and resident Dr Gregary Signs concerning the score and discussed a psych consult, Dr Gregary Signs also personally assessed pt. Around 0030, I asked the pt if she wanted to see the baby, giving her the option of bringing the baby to the room while she was awake or letting her visit the nursery, she chose the nursery. Per other staff she stayed for a few minutes holding baby and then returned to the room.   During morning vitals, pt expressed that her mood has been "off and on" and stated "it's both" when asked whether she was feeling more anxiety or sadness. However, her affect was less flat and I asked if she wanted baby to come back since she was going to be up a while. She said yes, and was appropriately accepting of the baby, immediately holding the baby to her chest when I brought the baby back.

## 2017-05-16 NOTE — Progress Notes (Signed)
Post Partum Day #1 Subjective: no complaints, up ad lib, voiding, tolerating PO and reports normal lochia, She is feeding baby, looking happy  Objective: Blood pressure 129/70, pulse 89, temperature 97.7 F (36.5 [name]), temperature source Axillary, resp. rate 18, height 5' 9"  (1.753 m), weight 103 kg (227 lb), last menstrual period 07/28/2016, unknown if currently breastfeeding.  Physical Exam:  General: alert Lochia: appropriate Uterine Fundus: firm and NT at U DVT Evaluation: No evidence of DVT seen on physical exam.   Recent Labs  05/15/17 1220  HGB 11.4*  HCT 33.4*    Assessment/Plan: Plan for discharge tomorrow  Bottle IUD Paxil   LOS: 1 day   Latoya [name] Latoya [name] 05/16/2017, 6:29 AM

## 2017-05-16 NOTE — Discharge Instructions (Signed)
Postpartum Depression and Baby Blues The postpartum period begins right after the birth of a baby. During this time, there is often a great amount of joy and excitement. It is also a time of many changes in the life of the parents. Regardless of how many times a mother gives birth, each child brings new challenges and dynamics to the family. It is not unusual to have feelings of excitement along with confusing shifts in moods, emotions, and thoughts. All mothers are at risk of developing postpartum depression or the "baby blues." These mood changes can occur right after giving birth, or they may occur many months after giving birth. The baby blues or postpartum depression can be mild or severe. Additionally, postpartum depression can go away rather quickly, or it can be a long-term condition. What are the causes? Raised hormone levels and the rapid drop in those levels are thought to be a main cause of postpartum depression and the baby blues. A number of hormones change during and after pregnancy. Estrogen and progesterone usually decrease right after the delivery of your baby. The levels of thyroid hormone and various cortisol steroids also rapidly drop. Other factors that play a role in these mood changes include major life events and genetics. What increases the risk? If you have any of the following risks for the baby blues or postpartum depression, know what symptoms to watch out for during the postpartum period. Risk factors that may increase the likelihood of getting the baby blues or postpartum depression include:  Having a personal or family history of depression.  Having depression while being pregnant.  Having premenstrual mood issues or mood issues related to oral contraceptives.  Having a lot of life stress.  Having marital conflict.  Lacking a social support network.  Having a baby with special needs.  Having health problems, such as diabetes.  What are the signs or  symptoms? Symptoms of baby blues include:  Brief changes in mood, such as going from extreme happiness to sadness.  Decreased concentration.  Difficulty sleeping.  Crying spells, tearfulness.  Irritability.  Anxiety.  Symptoms of postpartum depression typically begin within the first month after giving birth. These symptoms include:  Difficulty sleeping or excessive sleepiness.  Marked weight loss.  Agitation.  Feelings of worthlessness.  Lack of interest in activity or food.  Postpartum psychosis is a very serious condition and can be dangerous. Fortunately, it is rare. Displaying any of the following symptoms is cause for immediate medical attention. Symptoms of postpartum psychosis include:  Hallucinations and delusions.  Bizarre or disorganized behavior.  Confusion or disorientation.  How is this diagnosed? A diagnosis is made by an evaluation of your symptoms. There are no medical or lab tests that lead to a diagnosis, but there are various questionnaires that a health care provider may use to identify those with the baby blues, postpartum depression, or psychosis. Often, a screening tool called the [place] Postnatal Depression Scale is used to diagnose depression in the postpartum period. How is this treated? The baby blues usually goes away on its own in 1-2 weeks. Social support is often all that is needed. You will be encouraged to get adequate sleep and rest. Occasionally, you may be given medicines to help you sleep. Postpartum depression requires treatment because it can last several months or longer if it is not treated. Treatment may include individual or group therapy, medicine, or both to address any social, physiological, and psychological factors that may play a role in the  depression. Regular exercise, a healthy diet, rest, and social support may also be strongly recommended. Postpartum psychosis is more serious and needs treatment right away.  Hospitalization is often needed. Follow these instructions at home:  Get as much rest as you can. Nap when the baby sleeps.  Exercise regularly. Some women find yoga and walking to be beneficial.  Eat a balanced and nourishing diet.  Do little things that you enjoy. Have a cup of tea, take a bubble bath, read your favorite magazine, or listen to your favorite music.  Avoid alcohol.  Ask for help with household chores, cooking, grocery shopping, or running errands as needed. Do not try to do everything.  Talk to people close to you about how you are feeling. Get support from your partner, family members, friends, or other new moms.  Try to stay positive in how you think. Think about the things you are grateful for.  Do not spend a lot of time alone.  Only take over-the-counter or prescription medicine as directed by your health care provider.  Keep all your postpartum appointments.  Let your health care provider know if you have any concerns. Contact a health care provider if: You are having a reaction to or problems with your medicine. Get help right away if:  You have suicidal feelings.  You think you may harm the baby or someone else. This information is not intended to replace advice given to you by your health care provider. Make sure you discuss any questions you have with your health care provider. Document Released: 06/15/2004 Document Revised: 02/17/2016 Document Reviewed: 06/23/2013 Elsevier Interactive Patient Education  2017 [place] Instructions for Mom ACTIVITY  Gradually return to your regular activities.  Let yourself rest. Nap while your baby sleeps.  Avoid lifting anything that is heavier than 10 lb (4.5 kg) until your health care provider says it is okay.  Avoid activities that take a lot of effort and energy (are strenuous) until approved by your health care provider. Walking at a slow-to-moderate pace is usually safe.  If you had a  cesarean delivery: ? Do not vacuum, climb stairs, or drive a car for 4-6 weeks. ? Have someone help you at home until you feel like you can do your usual activities yourself. ? Do exercises as told by your health care provider, if this applies.  VAGINAL BLEEDING You may continue to bleed for 4-6 weeks after delivery. Over time, the amount of blood usually decreases and the color of the blood usually gets lighter. However, the flow of bright red blood may increase if you have been too active. If you need to use more than one pad in an hour because your pad gets soaked, or if you pass a large clot:  Lie down.  Raise your feet.  Place a cold compress on your lower abdomen.  Rest.  Call your health care provider.  If you are breastfeeding, your period should return anytime between 8 weeks after delivery and the time that you stop breastfeeding. If you are not breastfeeding, your period should return 6-8 weeks after delivery. PERINEAL CARE The perineal area, or perineum, is the part of your body between your thighs. After delivery, this area needs special care. Follow these instructions as told by your health care provider.  Take warm tub baths for 15-20 minutes.  Use medicated pads and pain-relieving sprays and creams as told.  Do not use tampons or douches until vaginal bleeding has stopped.  Each  time you go to the bathroom: ? Use a peri bottle. ? Change your pad. ? Use towelettes in place of toilet paper until your stitches have healed.  Do Kegel exercises every day. Kegel exercises help to maintain the muscles that support the vagina, bladder, and bowels. You can do these exercises while you are standing, sitting, or lying down. To do Kegel exercises: ? Tighten the muscles of your abdomen and the muscles that surround your birth canal. ? Hold for a few seconds. ? Relax. ? Repeat until you have done this 5 times in a row.  To prevent hemorrhoids from developing or getting  worse: ? Drink enough fluid to keep your urine clear or pale yellow. ? Avoid straining when having a bowel movement. ? Take over-the-counter medicines and stool softeners as told by your health care provider.  BREAST CARE  Wear a tight-fitting bra.  Avoid taking over-the-counter pain medicine for breast discomfort.  Apply ice to the breasts to help with discomfort as needed: ? Put ice in a plastic bag. ? Place a towel between your skin and the bag. ? Leave the ice on for 20 minutes or as told by your health care provider.  NUTRITION  Eat a well-balanced diet.  Do not try to lose weight quickly by cutting back on calories.  Take your prenatal vitamins until your postpartum checkup or until your health care provider tells you to stop.  POSTPARTUM DEPRESSION You may find yourself crying for no apparent reason and unable to cope with all of the changes that come with having a newborn. This mood is called postpartum depression. Postpartum depression happens because your hormone levels change after delivery. If you have postpartum depression, get support from your partner, friends, and family. If the depression does not go away on its own after several weeks, contact your health care provider. BREAST SELF-EXAM Do a breast self-exam each month, at the same time of the month. If you are breastfeeding, check your breasts just after a feeding, when your breasts are less full. If you are breastfeeding and your period has started, check your breasts on day 5, 6, or 7 of your period. Report any lumps, bumps, or discharge to your health care provider. Know that breasts are normally lumpy if you are breastfeeding. This is temporary, and it is not a health risk. INTIMACY AND SEXUALITY Avoid sexual activity for at least 3-4 weeks after delivery or until the brownish-red vaginal flow is completely gone. If you want to avoid pregnancy, use some form of birth control. You can get pregnant after delivery,  even if you have not had your period. SEEK MEDICAL CARE IF:  You feel unable to cope with the changes that a child brings to your life, and these feelings do not go away after several weeks.  You notice a lump, a bump, or discharge on your breast.  SEEK IMMEDIATE MEDICAL CARE IF:  Blood soaks your pad in 1 hour or less.  You have: ? Severe pain or cramping in your lower abdomen. ? A bad-smelling vaginal discharge. ? A fever that is not controlled by medicine. ? A fever, and an area of your breast is red and sore. ? Pain or redness in your calf. ? Sudden, severe chest pain. ? Shortness of breath. ? Painful or bloody urination. ? Problems with your vision.  You vomit for 12 hours or longer.  You develop a severe headache.  You have serious thoughts about hurting yourself, your child, or  anyone else.  This information is not intended to replace advice given to you by your health care provider. Make sure you discuss any questions you have with your health care provider. Document Released: 09/08/2000 Document Revised: 02/17/2016 Document Reviewed: 03/15/2015 Elsevier Interactive Patient Education  2017 [employer].

## 2017-05-16 NOTE — Consult Note (Signed)
[facility] Face-to-Face Psychiatry Consult   Reason for Consult:  Depression and suicide ideation Referring Physician:  Dr. Nehemiah Settle Patient Identification: Latoya [name] MRN:  [id number] Principal Diagnosis: SVD (spontaneous vaginal delivery) Diagnosis:   Patient Active Problem List   Diagnosis Date Noted  . SVD (spontaneous vaginal delivery) [O80] 05/15/2017  . Crack cocaine use [F14.90] 05/15/2017  . Cervical dysplasia [N87.9] 01/16/2017  . Obesity (BMI 30.0-34.9) [E66.9] 01/12/2017  . History of substance abuse [Z87.898] 01/12/2017  . History of gestational diabetes mellitus (GDM) [Z86.32] 01/12/2017  . History of gestational hypertension [Z87.59] 01/12/2017  . History of macrosomia in infant in prior pregnancy, currently pregnant [O09.299] 01/12/2017  . Short interval between pregnancies affecting pregnancy in second trimester, antepartum [O09.892] 01/12/2017  . Late prenatal care [O09.30] 01/12/2017  . Paxil use in early pregnancy [Z79.899] 01/12/2017  . History of suicide attempt [Z91.5] 01/12/2017  . Anxiety and depression [F41.9, F32.9] 01/12/2017  . Supervision of high risk pregnancy, antepartum [O09.90] 01/10/2017  . GDM (gestational diabetes mellitus) [O24.419] 02/25/2016  . Obesity in pregnancy [O99.210] 02/25/2016    Total Time spent with patient: 1 hour  Subjective:   Latoya [name] is a 26 y.o. female patient admitted with SVD.  HPI:  Latoya [name] is a 26 y.o. female, seen, chart reviewed and case discussed with the patient and patient mother who is at bedside for this face-to-face psychiatric consultation and evaluation of postpartum depression and history of substance abuse. Patient reported she has been with the baby's father who has been providing the substances for her or introduce drug of abuse. Patient reported she has been feeling guilty of unplanned pregnancy because she was pregnant but she got intoxicated with a drug of abuse. Patient reportedly want to  change her ongoing substance abuse and wants to participate in treatment for depression at this time. Patient has 2 children including the newborn within 13 months difference. Patient lives with her mother and mother is supportive to her. Patient reportedly taken medication for depression about 3-6 months after discharge from the hospital from first delivery. Patient endorses relapsing drug of abuse especially heroin and cocaine. Patient is educated about possible health complications, relationship complications and possible legal charges if she continued to use drugs while taking care of 2 children. Patient has stated that she wants to be a good mother for children and wants to stay out of drugs and get involved with treatment. Patient denies current suicidal/homicidal ideation, intention or plans. Patient has no evidence of hallucinations, delusions or paranoia.  Past psychiatric history. Patient has a history of posttraumatic stress disorder and this showed outpatient medication management and also has substance use disorder but no previous detox and rehabilitation treatments.  Risk to Self: Is patient at risk for suicide?: No Risk to Others:   Prior Inpatient Therapy:   Prior Outpatient Therapy:    Past Medical History:  Past Medical History:  Diagnosis Date  . Anemia   . Anxiety   . Depression   . Gestational diabetes   . History of substance abuse   . Pregnancy induced hypertension     Past Surgical History:  Procedure Laterality Date  . DEEP NECK LYMPH NODE BIOPSY / EXCISION  2011   Family History:  Family History  Problem Relation Age of Onset  . Diabetes Mother   . Hypertension Mother   . Hypertension Father   . Stroke Maternal Grandfather    Family Psychiatric  History: Patient has a younger sister was being  treated for mental health in the past.  Social History:  History  Alcohol Use No    Comment: "not for a while"     History  Drug Use No    Comment: last used 4  months ago    Social History   Social History  . Marital status: Single    Spouse name: N/A  . Number of children: N/A  . Years of education: N/A   Social History Main Topics  . Smoking status: Never Smoker  . Smokeless tobacco: Never Used  . Alcohol use No     Comment: "not for a while"  . Drug use: No     Comment: last used 4 months ago  . Sexual activity: Not Currently    Birth control/ protection: None   Other Topics Concern  . None   Social History Narrative  . None   Additional Social History:    Allergies:  No Known Allergies  Labs:  Results for orders placed or performed during the hospital encounter of 05/15/17 (from the past 48 hour(s))  CBC     Status: Abnormal   Collection Time: 05/15/17 12:20 PM  Result Value Ref Range   WBC 7.5 4.0 - 10.5 K/uL   RBC 4.22 3.87 - 5.11 MIL/uL   Hemoglobin 11.4 (L) 12.0 - 15.0 g/dL   HCT 33.4 (L) 36.0 - 46.0 %   MCV 79.1 78.0 - 100.0 fL   MCH 27.0 26.0 - 34.0 pg   MCHC 34.1 30.0 - 36.0 g/dL   RDW 14.3 11.5 - 15.5 %   Platelets 300 150 - 400 K/uL  RPR     Status: None   Collection Time: 05/15/17 12:20 PM  Result Value Ref Range   RPR Ser Ql Non Reactive Non Reactive    Comment: (NOTE) Performed At: [facility] [address] [place], [place] [id number] Lindon Romp MD [id number]   Type and screen [facility]     Status: None   Collection Time: 05/15/17 12:20 PM  Result Value Ref Range   ABO/RH(D) O POS    Antibody Screen NEG    Sample Expiration 05/18/2017   Glucose, capillary     Status: Abnormal   Collection Time: 05/15/17  1:22 PM  Result Value Ref Range   Glucose-Capillary 104 (H) 65 - 99 mg/dL  Urine rapid drug screen (hosp performed)     Status: None   Collection Time: 05/15/17  4:00 PM  Result Value Ref Range   Opiates NONE DETECTED NONE DETECTED   Cocaine NONE DETECTED NONE DETECTED   Benzodiazepines NONE DETECTED NONE DETECTED   Amphetamines NONE DETECTED NONE  DETECTED   Tetrahydrocannabinol NONE DETECTED NONE DETECTED   Barbiturates NONE DETECTED NONE DETECTED    Comment:        DRUG SCREEN FOR MEDICAL PURPOSES ONLY.  IF CONFIRMATION IS NEEDED FOR ANY PURPOSE, NOTIFY LAB WITHIN 5 DAYS.        LOWEST DETECTABLE LIMITS FOR URINE DRUG SCREEN Drug Class       Cutoff (ng/mL) Amphetamine      1000 Barbiturate      200 Benzodiazepine   614 Tricyclics       431 Opiates          300 Cocaine          300 THC              50     Current Facility-Administered Medications  Medication Dose  Route Frequency Provider Last Rate Last Dose  . acetaminophen (TYLENOL) tablet 650 mg  650 mg Oral Q4H PRN Seabron Spates, CNM      . benzocaine-Menthol (DERMOPLAST) 20-0.5 % topical spray 1 application  1 application Topical PRN Seabron Spates, CNM      . coconut oil  1 application Topical PRN Seabron Spates, CNM      . witch hazel-glycerin (TUCKS) pad 1 application  1 application Topical PRN Seabron Spates, CNM       And  . dibucaine (NUPERCAINAL) 1 % rectal ointment 1 application  1 application Rectal PRN Seabron Spates, CNM      . diphenhydrAMINE (BENADRYL) capsule 25 mg  25 mg Oral Q6H PRN Seabron Spates, CNM      . ibuprofen (ADVIL,MOTRIN) tablet 600 mg  600 mg Oral Q6H Seabron Spates, CNM   600 mg at 05/16/17 1137  . ondansetron (ZOFRAN) tablet 4 mg  4 mg Oral Q4H PRN Seabron Spates, CNM       Or  . ondansetron [facility]) injection 4 mg  4 mg Intravenous Q4H PRN Seabron Spates, CNM      . PARoxetine (PAXIL-CR) 24 hr tablet 25 mg  25 mg Oral Daily Degele, Jenne Pane, MD   25 mg at 05/16/17 0901  . prenatal multivitamin tablet 1 tablet  1 tablet Oral Q1200 Seabron Spates, CNM   1 tablet at 05/16/17 1137  . senna-docusate (Senokot-S) tablet 2 tablet  2 tablet Oral Q24H Seabron Spates, CNM   2 tablet at 05/16/17 0030  . simethicone (MYLICON) chewable tablet 80 mg  80 mg Oral PRN Seabron Spates, CNM      . Tdap (BOOSTRIX)  injection 0.5 mL  0.5 mL Intramuscular Once Seabron Spates, CNM      . zolpidem (AMBIEN) tablet 5 mg  5 mg Oral QHS PRN Seabron Spates, CNM        Musculoskeletal: Strength & Muscle Tone: within normal limits Gait & Station: normal Patient leans: N/A  Psychiatric Specialty Exam: Physical Exam as per history and physical   ROS patient seems to be worried about staying away from drug of abuse and not able to care for her children she does not get the help she desires for substance abuse and mental health. Patient denied nausea, vomiting, abdominal pain and shortness of breath and chest pain.   No Fever-chills, No Headache, No changes with Vision or hearing, reports vertigo No problems swallowing food or Liquids, No Chest pain, Cough or Shortness of Breath, No Abdominal pain, No Nausea or Vommitting, Bowel movements are regular, No Blood in stool or Urine, No dysuria, No new skin rashes or bruises, No new joints pains-aches,  No new weakness, tingling, numbness in any extremity, No recent weight gain or loss, No polyuria, polydypsia or polyphagia,   A full 10 point Review of Systems was done, except as stated above, all other Review of Systems were negative.  Blood pressure 129/70, pulse 89, temperature 97.7 F (36.5 C), temperature source Axillary, resp. rate 18, height 5' 9"  (1.753 m), weight 103 kg (227 lb), last menstrual period 07/28/2016, unknown if currently breastfeeding.Body mass index is 33.52 kg/m.  General Appearance: Casual  Eye Contact:  Good  Speech:  Clear and Coherent  Volume:  Decreased  Mood:  Anxious and Depressed  Affect:  Tearful  Thought Process:  Coherent and Goal Directed  Orientation:  Full (Time, Place, and  Person)  Thought Content:  Rumination  Suicidal Thoughts:  No  Homicidal Thoughts:  No  Memory:  Immediate;   Good Recent;   Fair Remote;   Fair  Judgement:  Impaired  Insight:  Fair  Psychomotor Activity:  Decreased  Concentration:   Concentration: Good and Attention Span: Fair  Recall:  Good  Fund of Knowledge:  Good  Language:  Good  Akathisia:  Negative  Handed:  Right  AIMS (if indicated):     Assets:  Communication Skills Desire for Improvement Financial Resources/Insurance Housing Intimacy Leisure Time Resilience Social Support Transportation  ADL's:  Intact  Cognition:  WNL  Sleep:        Treatment Plan Summary: 26 years old female, status post postpartum. Patient has a history of postpartum depression and substance use disorder. Patient denies current suicidal/homicidal ideation and has no evidence of psychosis. Patient mother is at bedside and support.  Recommendation: Patient may restart her previous anti-depressant medication Paxil CR 25 mg daily for depression and anxiety   Patient will be referred to the outpatient mental health and substance abuse programs at [place] discussed with the patient in a social worker  Counseled to stay as sober from drug of abuse, her last use of drugs was 6 months ago   Appreciate psychiatric consultation and we sign off as of today Please contact [phone] or [phone] if needs further assistance  Disposition: Patient does not meet criteria for psychiatric inpatient admission. Supportive therapy provided about ongoing stressors.  Ambrose Finland, MD 05/16/2017 12:04 PM

## 2017-05-17 NOTE — Discharge Summary (Signed)
OB Discharge Summary  Patient Name: Latoya [name] DOB: 1991/08/30 MRN: [id number]  Date of admission: 05/15/2017 Delivering MD: Seabron Spates   Date of discharge: 05/17/2017  Admitting diagnosis: LABOR Intrauterine pregnancy: [redacted]w[redacted]d    Secondary diagnosis:Principal Problem:   SVD (spontaneous vaginal delivery) Active Problems:   Crack cocaine use  Additional problems: morbid obesity, GDMA1, depression     Discharge diagnosis: Term Pregnancy Delivered                                                                      Complications: None  Hospital course:  Onset of Labor With Vaginal Delivery     26y.o. yo [id number] admitted in Active Labor on 05/15/2017. Patient had an uncomplicated labor course as follows:  Membrane Rupture Time/Date: 2:15 PM ,05/15/2017   Intrapartum Procedures: Episiotomy: None [1]                                         Lacerations:  1st degree [2];Perineal [11]  Patient had a delivery of a Viable infant. 05/15/2017  Information for the patient's newborn:  RoLakyra, Tippins0[[id number]]Delivery Method: Vag-Spont    Pateint had an uncomplicated postpartum course.  She is ambulating, tolerating a regular diet, passing flatus, and urinating well. Patient is discharged home in stable condition on 05/17/17.   Physical exam  Vitals:   05/15/17 2141 05/16/17 0533 05/16/17 1836 05/17/17 0602  BP:  129/70 119/74 122/61  Pulse:  89 85 72  Resp: 18 18 18 18   Temp:  97.7 F (36.5 C) 97.9 F (36.6 C) 98.6 F (37 C)  TempSrc:  Axillary Oral Oral  SpO2:   100%   Weight:      Height:       General: alert Lochia: appropriate Uterine Fundus: firm Incision: N/A DVT Evaluation: No evidence of DVT seen on physical exam. Labs: Lab Results  Component Value Date   WBC 7.5 05/15/2017   HGB 11.4 (L) 05/15/2017   HCT 33.4 (L) 05/15/2017   MCV 79.1 05/15/2017   PLT 300 05/15/2017   CMP Latest Ref Rng & Units 01/14/2017  Glucose 65 -  99 mg/dL 101(H)  BUN 6 - 20 mg/dL 8  Creatinine 0.44 - 1.00 mg/dL 0.40(L)  Sodium 135 - 145 mmol/L 135  Potassium 3.5 - 5.1 mmol/L 3.8  Chloride 101 - 111 mmol/L 105  CO2 22 - 32 mmol/L 22  Calcium 8.9 - 10.3 mg/dL 8.7(L)  Total Protein 6.5 - 8.1 g/dL 7.6  Total Bilirubin 0.3 - 1.2 mg/dL 0.4  Alkaline Phos 38 - 126 U/L 47  AST 15 - 41 U/L 17  ALT 14 - 54 U/L 16    Discharge instruction: per After Visit Summary and "Baby and Me Booklet".  After Visit Meds:  Allergies as of 05/17/2017   No Known Allergies     Medication List    STOP taking these medications   glyBURIDE 2.5 MG tablet Commonly known as:  DIABETA   ondansetron 4 MG tablet Commonly known as:  ZOFRAN     TAKE these  medications   acetaminophen 500 MG tablet Commonly known as:  TYLENOL Take 1,000 mg by mouth every 6 (six) hours as needed for mild pain, moderate pain, fever or headache.   diphenhydrAMINE 25 MG tablet Commonly known as:  BENADRYL Take 50 mg by mouth at bedtime as needed for sleep.   diphenhydramine-acetaminophen 25-500 MG Tabs tablet Commonly known as:  TYLENOL PM Take 1 tablet by mouth at bedtime as needed (sleep and pain).   ibuprofen 600 MG tablet Commonly known as:  ADVIL,MOTRIN Take 1 tablet (600 mg total) by mouth every 6 (six) hours.   PARoxetine 25 MG 24 hr tablet Commonly known as:  PAXIL-CR Take 1 tablet (25 mg total) by mouth daily.   prenatal multivitamin Tabs tablet Take 1 tablet by mouth daily.            Discharge Care Instructions        Start     Ordered   05/16/17 0000  ibuprofen (ADVIL,MOTRIN) 600 MG tablet  Every 6 hours     05/16/17 0634   05/16/17 0000  PARoxetine (PAXIL-CR) 25 MG 24 hr tablet  Daily     05/16/17 0634      Diet: carb modified diet  Activity: Advance as tolerated. Pelvic rest for 6 weeks.   Outpatient follow up:4 weeks Follow up Appt:Future Appointments Date Time Provider [place]  06/26/2017 1:20 PM Rasch, Artist Pais,  NP [facility]   Follow up visit: No Follow-up on file.  Postpartum contraception: plans IUD at postpartum visit  Newborn Data: Live born female  Birth Weight: 7 lb 9.2 oz (3436 g) APGAR: 9, 9  Baby Feeding: Bottle Disposition:home with mother   05/17/2017 Emily Filbert, MD

## 2017-05-18 ENCOUNTER — Inpatient Hospital Stay (HOSPITAL_COMMUNITY): Admission: RE | Admit: 2017-05-18 | Payer: Medicaid Other | Source: Ambulatory Visit

## 2017-06-17 ENCOUNTER — Encounter (HOSPITAL_COMMUNITY): Payer: Self-pay | Admitting: *Deleted

## 2017-06-17 ENCOUNTER — Inpatient Hospital Stay (HOSPITAL_COMMUNITY)
Admission: AD | Admit: 2017-06-17 | Discharge: 2017-06-17 | Disposition: A | Discharge status: Home / Self Care | Payer: Medicaid Other | Source: Ambulatory Visit | Attending: Obstetrics and Gynecology | Admitting: Obstetrics and Gynecology

## 2017-06-17 DIAGNOSIS — K642 Third degree hemorrhoids: Secondary | ICD-10-CM | POA: Diagnosis not present

## 2017-06-17 DIAGNOSIS — O872 Hemorrhoids in the puerperium: Secondary | ICD-10-CM | POA: Diagnosis not present

## 2017-06-17 LAB — URINALYSIS, ROUTINE W REFLEX MICROSCOPIC
Glucose, UA: NEGATIVE mg/dL
Ketones, ur: 5 mg/dL — AB
Nitrite: NEGATIVE
Protein, ur: >=300 mg/dL — AB
Specific Gravity, Urine: >1.046 — ABNORMAL HIGH (ref 1.005–1.030)
pH: 5 (ref 5.0–8.0)

## 2017-06-17 MED ORDER — TRAMADOL HCL 50 MG PO TABS: 50.0000 mg | ORAL_TABLET | Freq: Four times a day (QID) | ORAL | 0 refills | Status: DC | PRN

## 2017-06-17 MED ORDER — HYDROCORTISONE ACETATE 25 MG RE SUPP
25.0000 mg | Freq: Two times a day (BID) | RECTAL | Status: DC
  Administered 2017-06-17: 25 mg via RECTAL
  Filled 2017-06-17 (×2): qty 1

## 2017-06-17 MED ORDER — NAPROXEN 500 MG PO TABS: 500.0000 mg | ORAL_TABLET | Freq: Two times a day (BID) | ORAL | 0 refills | Status: DC

## 2017-06-17 MED ORDER — KETOROLAC TROMETHAMINE 60 MG/2ML IM SOLN
60.0000 mg | Freq: Once | INTRAMUSCULAR | Status: AC
  Administered 2017-06-17: 60 mg via INTRAMUSCULAR
  Filled 2017-06-17: qty 2

## 2017-06-17 MED ORDER — LIDOCAINE HCL 2 % EX GEL
1.0000 "application " | Freq: Once | CUTANEOUS | Status: AC
  Administered 2017-06-17: 1 via TOPICAL
  Filled 2017-06-17: qty 5

## 2017-06-17 MED ORDER — HYDROCORTISONE ACETATE 25 MG RE SUPP: 25.0000 mg | Freq: Two times a day (BID) | RECTAL | 1 refills | Status: DC

## 2017-06-17 MED ORDER — PHENYLEPH-SHARK LIV OIL-MO-PET 0.25-3-14-71.9 % RE OINT
TOPICAL_OINTMENT | Freq: Once | RECTAL | Status: DC
  Filled 2017-06-17: qty 28

## 2017-06-17 NOTE — Discharge Instructions (Signed)
Hemorrhoids Hemorrhoids are swollen veins in and around the rectum or anus. There are two types of hemorrhoids:  Internal hemorrhoids. These occur in the veins that are just inside the rectum. They may poke through to the outside and become irritated and painful.  External hemorrhoids. These occur in the veins that are outside of the anus and can be felt as a painful swelling or hard lump near the anus.  Most hemorrhoids do not cause serious problems, and they can be managed with home treatments such as diet and lifestyle changes. If home treatments do not help your symptoms, procedures can be done to shrink or remove the hemorrhoids. What are the causes? This condition is caused by increased pressure in the anal area. This pressure may result from various things, including:  Constipation.  Straining to have a bowel movement.  Diarrhea.  Pregnancy.  Obesity.  Sitting for long periods of time.  Heavy lifting or other activity that causes you to strain.  Anal sex.  What are the signs or symptoms? Symptoms of this condition include:  Pain.  Anal itching or irritation.  Rectal bleeding.  Leakage of stool (feces).  Anal swelling.  One or more lumps around the anus.  How is this diagnosed? This condition can often be diagnosed through a visual exam. Other exams or tests may also be done, such as:  Examination of the rectal area with a gloved hand (digital rectal exam).  Examination of the anal canal using a small tube (anoscope).  A blood test, if you have lost a significant amount of blood.  A test to look inside the colon (sigmoidoscopy or colonoscopy).  How is this treated? This condition can usually be treated at home. However, various procedures may be done if dietary changes, lifestyle changes, and other home treatments do not help your symptoms. These procedures can help make the hemorrhoids smaller or remove them completely. Some of these procedures involve  surgery, and others do not. Common procedures include:  Rubber band ligation. Rubber bands are placed at the base of the hemorrhoids to cut off the blood supply to them.  Sclerotherapy. Medicine is injected into the hemorrhoids to shrink them.  Infrared coagulation. A type of light energy is used to get rid of the hemorrhoids.  Hemorrhoidectomy surgery. The hemorrhoids are surgically removed, and the veins that supply them are tied off.  Stapled hemorrhoidopexy surgery. A circular stapling device is used to remove the hemorrhoids and use staples to cut off the blood supply to them.  Follow these instructions at home: Eating and drinking  Eat foods that have a lot of fiber in them, such as whole grains, beans, nuts, fruits, and vegetables. Ask your health care provider about taking products that have added fiber (fiber supplements).  Drink enough fluid to keep your urine clear or pale yellow. Managing pain and swelling  Take warm sitz baths for 20 minutes, 3-4 times a day to ease pain and discomfort.  If directed, apply ice to the affected area. Using ice packs between sitz baths may be helpful. ? Put ice in a plastic bag. ? Place a towel between your skin and the bag. ? Leave the ice on for 20 minutes, 2-3 times a day. General instructions  Take over-the-counter and prescription medicines only as told by your health care provider.  Use medicated creams or suppositories as told.  Exercise regularly.  Go to the bathroom when you have the urge to have a bowel movement. Do not wait.  Avoid straining to have bowel movements.  Keep the anal area dry and clean. Use wet toilet paper or moist towelettes after a bowel movement.  Do not sit on the toilet for long periods of time. This increases blood pooling and pain. Contact a health care provider if:  You have increasing pain and swelling that are not controlled by treatment or medicine.  You have uncontrolled bleeding.  You  have difficulty having a bowel movement, or you are unable to have a bowel movement.  You have pain or inflammation outside the area of the hemorrhoids. This information is not intended to replace advice given to you by your health care provider. Make sure you discuss any questions you have with your health care provider. Document Released: 09/08/2000 Document Revised: 02/09/2016 Document Reviewed: 05/26/2015 Elsevier Interactive Patient Education  2017 [employer].   High-Fiber Diet Fiber, also called dietary fiber, is a type of carbohydrate found in fruits, vegetables, whole grains, and beans. A high-fiber diet can have many health benefits. Your health care provider may recommend a high-fiber diet to help:  Prevent constipation. Fiber can make your bowel movements more regular.  Lower your cholesterol.  Relieve hemorrhoids, uncomplicated diverticulosis, or irritable bowel syndrome.  Prevent overeating as part of a weight-loss plan.  Prevent heart disease, type 2 diabetes, and certain cancers.  What is my plan? The recommended daily intake of fiber includes:  38 grams for men under age 60.  66 grams for men over age 58.  2 grams for women under age 64.  82 grams for women over age 58.  You can get the recommended daily intake of dietary fiber by eating a variety of fruits, vegetables, grains, and beans. Your health care provider may also recommend a fiber supplement if it is not possible to get enough fiber through your diet. What do I need to know about a high-fiber diet?  Fiber supplements have not been widely studied for their effectiveness, so it is better to get fiber through food sources.  Always check the fiber content on thenutrition facts label of any prepackaged food. Look for foods that contain at least 5 grams of fiber per serving.  Ask your dietitian if you have questions about specific foods that are related to your condition, especially if those foods are  not listed in the following section.  Increase your daily fiber consumption gradually. Increasing your intake of dietary fiber too quickly may cause bloating, cramping, or gas.  Drink plenty of water. Water helps you to digest fiber. What foods can I eat? Grains Whole-grain breads. Multigrain cereal. Oats and oatmeal. Brown rice. Barley. Bulgur wheat. [place]. Bran muffins. Popcorn. Rye wafer crackers. Vegetables Sweet potatoes. Spinach. Kale. Artichokes. Cabbage. Broccoli. Green peas. Carrots. Squash. Fruits Berries. Pears. Apples. Oranges. Avocados. Prunes and raisins. Dried figs. Meats and Other Protein Sources Navy, kidney, pinto, and soy beans. Split peas. Lentils. Nuts and seeds. Dairy Fiber-fortified yogurt. Beverages Fiber-fortified soy milk. Fiber-fortified orange juice. Other Fiber bars. The items listed above may not be a complete list of recommended foods or beverages. Contact your dietitian for more options. What foods are not recommended? Grains White bread. Pasta made with refined flour. White rice. Vegetables Fried potatoes. Canned vegetables. Well-cooked vegetables. Fruits Fruit juice. Cooked, strained fruit. Meats and Other Protein Sources Fatty cuts of meat. Fried Sales executive or fried fish. Dairy Milk. Yogurt. Cream cheese. Sour cream. Beverages Soft drinks. Other Cakes and pastries. Butter and oils. The items listed above may not be  a complete list of foods and beverages to avoid. Contact your dietitian for more information. What are some tips for including high-fiber foods in my diet?  Eat a wide variety of high-fiber foods.  Make sure that half of all grains consumed each day are whole grains.  Replace breads and cereals made from refined flour or white flour with whole-grain breads and cereals.  Replace white rice with brown rice, bulgur wheat, or millet.  Start the day with a breakfast that is high in fiber, such as a cereal that contains at least 5  grams of fiber per serving.  Use beans in place of meat in soups, salads, or pasta.  Eat high-fiber snacks, such as berries, raw vegetables, nuts, or popcorn. This information is not intended to replace advice given to you by your health care provider. Make sure you discuss any questions you have with your health care provider. Document Released: 09/11/2005 Document Revised: 02/17/2016 Document Reviewed: 02/24/2014 Elsevier Interactive Patient Education  2017 [employer].

## 2017-06-17 NOTE — MAU Provider Note (Signed)
History     CSN: [id number]  Arrival date and time: 06/17/17 1550   First Provider Initiated Contact with Patient 06/17/17 1627      Chief Complaint  Patient presents with  . Hemorrhoids   Latoya [name] is a 26 y.o. [id number] who is S/P NSVD on 05/15/17. She is here today with hemorrhoids. She states that for about 2 weeks they have been increasing in pain, and now in the last 4 days the pain has been unbearable. She reports that they are very hard, and she cannot push them back in. She has also not been able to use any medications for them at this time because of the pain. She rates her pain 10/10 at this time. No other postpartum complications, bleeding is scant, no abdominal pain, no problems voiding. She is not breast feeding.   Other  This is a new problem. The current episode started in the past 7 days. The problem occurs constantly. The problem has been unchanged. Pertinent negatives include no chills, fever, nausea or vomiting. Nothing aggravates the symptoms. She has tried nothing for the symptoms.   Past Medical History:  Diagnosis Date  . Anemia   . Anxiety   . Depression   . Gestational diabetes   . History of substance abuse   . Pregnancy induced hypertension     Past Surgical History:  Procedure Laterality Date  . DEEP NECK LYMPH NODE BIOPSY / EXCISION  2011    Family History  Problem Relation Age of Onset  . Diabetes Mother   . Hypertension Mother   . Hypertension Father   . Stroke Maternal Grandfather     Social History  Substance Use Topics  . Smoking status: Never Smoker  . Smokeless tobacco: Never Used  . Alcohol use No     Comment: "not for a while"    Allergies: No Known Allergies  Prescriptions Prior to Admission  Medication Sig Dispense Refill Last Dose  . acetaminophen (TYLENOL) 500 MG tablet Take 1,000 mg by mouth every 6 (six) hours as needed for mild pain, moderate pain, fever or headache.    06/17/2017 at Unknown time  .  diphenhydramine-acetaminophen (TYLENOL PM) 25-500 MG TABS tablet Take 1 tablet by mouth at bedtime as needed (sleep and pain).    06/16/2017 at Unknown time  . ibuprofen (ADVIL,MOTRIN) 600 MG tablet Take 1 tablet (600 mg total) by mouth every 6 (six) hours. (Patient taking differently: Take 600 mg by mouth every 6 (six) hours as needed for moderate pain or cramping. ) 30 tablet 0 06/17/2017 at Unknown time  . ondansetron (ZOFRAN) 4 MG tablet Take 4 mg by mouth daily.  0 06/17/2017 at Unknown time  . PARoxetine (PAXIL-CR) 25 MG 24 hr tablet Take 1 tablet (25 mg total) by mouth daily. 31 tablet 12 Past Week at Unknown time    Review of Systems  Constitutional: Negative for chills and fever.  Gastrointestinal: Positive for diarrhea. Negative for nausea and vomiting.  Genitourinary: Negative for dysuria, frequency and urgency.   Physical Exam   Blood pressure 132/84, pulse (!) 122, temperature 98.1 F (36.7 C), temperature source Oral, resp. rate 16, unknown if currently breastfeeding.  Physical Exam  Nursing note and vitals reviewed. Constitutional: She is oriented to person, place, and time. She appears well-developed and well-nourished. She appears distressed.  Cardiovascular: Normal rate.   Respiratory: Effort normal.  GI: Soft. There is no tenderness. There is no rebound.  Genitourinary:  Genitourinary Comments: Multiple small,  externalized hemorrhoids. Not thrombosed at this time.   Neurological: She is alert and oriented to person, place, and time.  Skin: Skin is warm and dry.  Psychiatric: She has a normal mood and affect.    MAU Course  Procedures  MDM Patient has had Toradol, Anusol suppository and lidocaine jelly. She reports that she is feeling better at this time.   Assessment and Plan   1. Grade III hemorrhoids   2. Postpartum care and examination    DC home Comfort measures reviewed  RX: Naproxen, Anusol and tramadol  Return to MAU as needed FU with OB as  planned  [name] Follow up.   Specialty:  Obstetrics and Gynecology Contact information: [place] 06/17/2017, 4:29 PM

## 2017-06-17 NOTE — MAU Note (Signed)
Patient has hemorrhoids very painful, has a tear which did not require stitches.  Had vaginal birth 05/15/17

## 2017-06-21 ENCOUNTER — Encounter (HOSPITAL_COMMUNITY): Payer: Self-pay

## 2017-06-21 ENCOUNTER — Inpatient Hospital Stay (HOSPITAL_COMMUNITY)
Admission: AD | Admit: 2017-06-21 | Discharge: 2017-06-22 | Disposition: A | Discharge status: Home / Self Care | Payer: Medicaid Other | Source: Ambulatory Visit | Attending: Obstetrics & Gynecology | Admitting: Obstetrics & Gynecology

## 2017-06-21 DIAGNOSIS — Z79899 Other long term (current) drug therapy: Secondary | ICD-10-CM | POA: Diagnosis not present

## 2017-06-21 DIAGNOSIS — Z8632 Personal history of gestational diabetes: Secondary | ICD-10-CM | POA: Insufficient documentation

## 2017-06-21 DIAGNOSIS — K642 Third degree hemorrhoids: Secondary | ICD-10-CM | POA: Insufficient documentation

## 2017-06-21 DIAGNOSIS — E876 Hypokalemia: Secondary | ICD-10-CM | POA: Diagnosis not present

## 2017-06-21 DIAGNOSIS — R197 Diarrhea, unspecified: Secondary | ICD-10-CM | POA: Diagnosis not present

## 2017-06-21 DIAGNOSIS — F419 Anxiety disorder, unspecified: Secondary | ICD-10-CM | POA: Insufficient documentation

## 2017-06-21 DIAGNOSIS — Z833 Family history of diabetes mellitus: Secondary | ICD-10-CM | POA: Insufficient documentation

## 2017-06-21 DIAGNOSIS — Z8249 Family history of ischemic heart disease and other diseases of the circulatory system: Secondary | ICD-10-CM | POA: Diagnosis not present

## 2017-06-21 DIAGNOSIS — D649 Anemia, unspecified: Secondary | ICD-10-CM | POA: Diagnosis not present

## 2017-06-21 DIAGNOSIS — R109 Unspecified abdominal pain: Secondary | ICD-10-CM | POA: Diagnosis present

## 2017-06-21 DIAGNOSIS — F329 Major depressive disorder, single episode, unspecified: Secondary | ICD-10-CM | POA: Diagnosis not present

## 2017-06-21 LAB — URINALYSIS, ROUTINE W REFLEX MICROSCOPIC
Bilirubin Urine: NEGATIVE
Glucose, UA: NEGATIVE mg/dL
Ketones, ur: NEGATIVE mg/dL
Leukocytes, UA: NEGATIVE
Nitrite: NEGATIVE
Protein, ur: 100 mg/dL — AB
Specific Gravity, Urine: 1.019 (ref 1.005–1.030)
pH: 5 (ref 5.0–8.0)

## 2017-06-21 LAB — COMPREHENSIVE METABOLIC PANEL
ALT: 29 U/L (ref 14–54)
AST: 25 U/L (ref 15–41)
Albumin: 3.4 g/dL — ABNORMAL LOW (ref 3.5–5.0)
Alkaline Phosphatase: 151 U/L — ABNORMAL HIGH (ref 38–126)
Anion gap: 12 (ref 5–15)
BUN: 6 mg/dL (ref 6–20)
CO2: 27 mmol/L (ref 22–32)
Calcium: 8.8 mg/dL — ABNORMAL LOW (ref 8.9–10.3)
Chloride: 97 mmol/L — ABNORMAL LOW (ref 101–111)
Creatinine, Ser: 0.86 mg/dL (ref 0.44–1.00)
GFR calc Af Amer: >60 mL/min (ref >60)
GFR calc non Af Amer: >60 mL/min (ref >60)
Glucose, Bld: 116 mg/dL — ABNORMAL HIGH (ref 65–99)
Potassium: 2.5 mmol/L — CL (ref 3.5–5.1)
Sodium: 136 mmol/L (ref 135–145)
Total Bilirubin: 0.8 mg/dL (ref 0.3–1.2)
Total Protein: 8.3 g/dL — ABNORMAL HIGH (ref 6.5–8.1)

## 2017-06-21 LAB — CBC WITH DIFFERENTIAL/PLATELET
Basophils Absolute: 0 10*3/uL (ref 0.0–0.1)
Basophils Relative: 0 %
Eosinophils Absolute: 0 10*3/uL (ref 0.0–0.7)
Eosinophils Relative: 0 %
HCT: 35.5 % — ABNORMAL LOW (ref 36.0–46.0)
Hemoglobin: 11.7 g/dL — ABNORMAL LOW (ref 12.0–15.0)
Lymphocytes Relative: 19 %
Lymphs Abs: 2.7 10*3/uL (ref 0.7–4.0)
MCH: 25.3 pg — ABNORMAL LOW (ref 26.0–34.0)
MCHC: 33 g/dL (ref 30.0–36.0)
MCV: 76.8 fL — ABNORMAL LOW (ref 78.0–100.0)
Monocytes Absolute: 1.8 10*3/uL — ABNORMAL HIGH (ref 0.1–1.0)
Monocytes Relative: 13 %
Neutro Abs: 9.6 10*3/uL — ABNORMAL HIGH (ref 1.7–7.7)
Neutrophils Relative %: 68 %
Platelets: 564 10*3/uL — ABNORMAL HIGH (ref 150–400)
RBC: 4.62 MIL/uL (ref 3.87–5.11)
RDW: 14.4 % (ref 11.5–15.5)
WBC: 14.1 10*3/uL — ABNORMAL HIGH (ref 4.0–10.5)

## 2017-06-21 MED ORDER — PRAMOXINE-HC 1-1 % EX FOAM
Freq: Three times a day (TID) | CUTANEOUS | Status: DC | PRN
  Administered 2017-06-21: 23:00:00 via TOPICAL
  Filled 2017-06-21: qty 10

## 2017-06-21 MED ORDER — POTASSIUM CHLORIDE CRYS ER 20 MEQ PO TBCR
40.0000 meq | EXTENDED_RELEASE_TABLET | Freq: Once | ORAL | Status: AC
  Administered 2017-06-21: 40 meq via ORAL
  Filled 2017-06-21: qty 2

## 2017-06-21 MED ORDER — POTASSIUM CHLORIDE 10 MEQ/100ML IV SOLN
10.0000 meq | INTRAVENOUS | Status: DC
  Administered 2017-06-21 – 2017-06-22 (×3): 10 meq via INTRAVENOUS
  Filled 2017-06-21 (×6): qty 100

## 2017-06-21 MED ORDER — LIDOCAINE HCL 2 % EX GEL
1.0000 "application " | Freq: Once | CUTANEOUS | Status: AC
  Administered 2017-06-21: 1 via TOPICAL
  Filled 2017-06-21: qty 5

## 2017-06-21 MED ORDER — HYDROCORTISONE ACETATE 25 MG RE SUPP
25.0000 mg | Freq: Once | RECTAL | Status: DC
  Filled 2017-06-21: qty 1

## 2017-06-21 MED ORDER — LOPERAMIDE HCL 2 MG PO CAPS
4.0000 mg | ORAL_CAPSULE | Freq: Once | ORAL | Status: AC
  Administered 2017-06-21: 4 mg via ORAL
  Filled 2017-06-21: qty 2

## 2017-06-21 MED ORDER — KETOROLAC TROMETHAMINE 30 MG/ML IJ SOLN
30.0000 mg | Freq: Once | INTRAMUSCULAR | Status: AC
  Administered 2017-06-21: 30 mg via INTRAVENOUS
  Filled 2017-06-21: qty 1

## 2017-06-21 NOTE — MAU Note (Signed)
Pt reports she has been having watery bloody diarrhea for the last week, states she is having lower abd pain and rectal pain.

## 2017-06-21 NOTE — MAU Note (Signed)
Critcal Lab Value Potassium 2.5 . Latoya [name] CNM Made Aware

## 2017-06-21 NOTE — MAU Provider Note (Signed)
Patient Latoya [name] is a 26 y.o. G2P2002 6 weeks postpartum with multiple complaints of diarrhea, abdominal pain, weakness, hemorrhoids and overall frustration at being in pain from hemorrhoids.  Patient was seen in MAU on 06-17-2017 and prescribed anusol, naproxen and tramadol. She has also been seen by her PA for hemorrhoids and had a surgical consult on Monday [facility]. Patient says that she was told that she was not a surgical candidate; patient is very frustrated at being in pain.  Patient has a history of depression and is taking paxil.  History     CSN: [id number]  Arrival date and time: 06/21/17 1610   None     Chief Complaint  Patient presents with  . Abdominal Pain  . Diarrhea   Abdominal Pain  This is a new problem. The current episode started in the past 7 days. The problem occurs constantly. The problem has been unchanged. Associated symptoms include diarrhea.  Diarrhea   This is a new problem. The current episode started in the past 7 days. The problem occurs 5 to 10 times per day. The problem has been gradually worsening. The stool consistency is described as watery and bloody. Associated symptoms include abdominal pain.   Patient says that she started taking Nystatin for Thrush on Friday, Sept 21. On Saturday or Sunday, she started having reccurent diarrhea. She denies sick contacts, recent antibiotic use or travel to endemic areas.   Patient is upset and frustrated; she states that she wants everything to go away. When asked if she thinks about harming herself she says "not today". Patient has a history of anxiety and depression and has only recently started taking antidepressants. The patient states that her hemorrhoid pain is making her depressed and anxious, but she does not have any current plans to harm herself or her child.  OB History    Gravida Para Term Preterm AB Living   2 2 2  0 0 2   SAB TAB Ectopic Multiple Live Births   0 0 0 0 2       Past Medical History:  Diagnosis Date  . Anemia   . Anxiety   . Depression   . Gestational diabetes   . History of substance abuse   . Pregnancy induced hypertension     Past Surgical History:  Procedure Laterality Date  . DEEP NECK LYMPH NODE BIOPSY / EXCISION  2011    Family History  Problem Relation Age of Onset  . Diabetes Mother   . Hypertension Mother   . Hypertension Father   . Stroke Maternal Grandfather     Social History  Substance Use Topics  . Smoking status: Never Smoker  . Smokeless tobacco: Never Used  . Alcohol use No     Comment: "not for a while"    Allergies: No Known Allergies  Prescriptions Prior to Admission  Medication Sig Dispense Refill Last Dose  . acetaminophen (TYLENOL) 500 MG tablet Take 1,000 mg by mouth every 6 (six) hours as needed for mild pain, moderate pain, fever or headache.    06/17/2017 at Unknown time  . diphenhydramine-acetaminophen (TYLENOL PM) 25-500 MG TABS tablet Take 1 tablet by mouth at bedtime as needed (sleep and pain).    06/16/2017 at Unknown time  . hydrocortisone (ANUSOL-HC) 25 MG suppository Place 1 suppository (25 mg total) rectally 2 (two) times daily. 12 suppository 1   . ibuprofen (ADVIL,MOTRIN) 600 MG tablet Take 1 tablet (600 mg total) by mouth every 6 (  six) hours. (Patient taking differently: Take 600 mg by mouth every 6 (six) hours as needed for moderate pain or cramping. ) 30 tablet 0 06/17/2017 at Unknown time  . naproxen (NAPROSYN) 500 MG tablet Take 1 tablet (500 mg total) by mouth 2 (two) times daily. 30 tablet 0   . ondansetron (ZOFRAN) 4 MG tablet Take 4 mg by mouth daily.  0 06/17/2017 at Unknown time  . PARoxetine (PAXIL-CR) 25 MG 24 hr tablet Take 1 tablet (25 mg total) by mouth daily. 31 tablet 12 Past Week at Unknown time  . traMADol (ULTRAM) 50 MG tablet Take 1 tablet (50 mg total) by mouth every 6 (six) hours as needed. 15 tablet 0     Review of Systems  Gastrointestinal: Positive for abdominal  pain, diarrhea and rectal pain.  Genitourinary: Negative.   Musculoskeletal: Negative.   Neurological: Negative.    Physical Exam   Blood pressure 118/72, pulse (!) 115, temperature 97.7 F (36.5 C), temperature source Oral, resp. rate 18, SpO2 99 %, unknown if currently breastfeeding.  Physical Exam  Constitutional: She is oriented to person, place, and time. She appears well-developed.  HENT:  Head: Normocephalic.  Neck: Normal range of motion.  Respiratory: Effort normal.  GI: Soft. She exhibits no distension. There is no tenderness. There is no rebound and no guarding.  Genitourinary:  Genitourinary Comments: 4 small reddened hardeded hemorrhoids extruding from the anus; tender, no signs of thrombosis.   Musculoskeletal: Normal range of motion.  Neurological: She is alert and oriented to person, place, and time.  Skin: Skin is warm and dry.  Psychiatric: She has a normal mood and affect.    MAU Course  Procedures  MDM -Patient received IV fluids with 3 K-rider bags over a three hour period; a decision was made to d/c IV replacement and discharge patient home with oral K+ replacement.  -patient received Toradol, lidocaine jelly and proctofoam cream; pain is unrelieved.   Discussed with patient the importance of following all hemorrhoid relief measures, continuing her pain control regimen at home. Recommended sitz baths, warm compresses and combination of clobetasol cream and prep H cream at home. I also encouraged patient to call her PA today (Friday) and ask to be seen again, as well as to ask for another referral for surgical consultation.   Message also sent to [facility] for patient to have follow up appt next week and to meet with [facility], Education officer, museum, at [facility].   Assessment and Plan   1. Hypokalemia due to loss of potassium   2. Grade III hemorrhoids    2. Patient to stop taking her nystatin and see if that resolves her diarrhea; patient will also start warm compresses and  begin clobetasol/Prep H combination for softening of hemorrhoids.  3. RX for t K+ replacement pills BID 4. Patient stable for discharge.  Mervyn Skeeters [place] 06/21/2017, 9:19 PM

## 2017-06-22 MED ORDER — POTASSIUM CHLORIDE CRYS ER 20 MEQ PO TBCR: 40.0000 meq | EXTENDED_RELEASE_TABLET | Freq: Two times a day (BID) | ORAL | 0 refills | Status: DC

## 2017-06-22 MED ORDER — CLOBETASOL PROPIONATE 0.05 % EX CREA: 1.0000 "application " | TOPICAL_CREAM | Freq: Two times a day (BID) | CUTANEOUS | 0 refills | Status: DC

## 2017-06-22 NOTE — Discharge Instructions (Signed)
1. Warm compress several times daily, plus sitz bath.  2. Continue anti-constipation measures (stool softeners, etc).  3. Apply combination of clobetazole cream and prep H or anusol 3 times days.    Hemorrhoids Hemorrhoids are swollen veins in and around the rectum or anus. There are two types of hemorrhoids:  Internal hemorrhoids. These occur in the veins that are just inside the rectum. They may poke through to the outside and become irritated and painful.  External hemorrhoids. These occur in the veins that are outside of the anus and can be felt as a painful swelling or hard lump near the anus.  Most hemorrhoids do not cause serious problems, and they can be managed with home treatments such as diet and lifestyle changes. If home treatments do not help your symptoms, procedures can be done to shrink or remove the hemorrhoids. What are the causes? This condition is caused by increased pressure in the anal area. This pressure may result from various things, including:  Constipation.  Straining to have a bowel movement.  Diarrhea.  Pregnancy.  Obesity.  Sitting for long periods of time.  Heavy lifting or other activity that causes you to strain.  Anal sex.  What are the signs or symptoms? Symptoms of this condition include:  Pain.  Anal itching or irritation.  Rectal bleeding.  Leakage of stool (feces).  Anal swelling.  One or more lumps around the anus.  How is this diagnosed? This condition can often be diagnosed through a visual exam. Other exams or tests may also be done, such as:  Examination of the rectal area with a gloved hand (digital rectal exam).  Examination of the anal canal using a small tube (anoscope).  A blood test, if you have lost a significant amount of blood.  A test to look inside the colon (sigmoidoscopy or colonoscopy).  How is this treated? This condition can usually be treated at home. However, various procedures may be done if  dietary changes, lifestyle changes, and other home treatments do not help your symptoms. These procedures can help make the hemorrhoids smaller or remove them completely. Some of these procedures involve surgery, and others do not. Common procedures include:  Rubber band ligation. Rubber bands are placed at the base of the hemorrhoids to cut off the blood supply to them.  Sclerotherapy. Medicine is injected into the hemorrhoids to shrink them.  Infrared coagulation. A type of light energy is used to get rid of the hemorrhoids.  Hemorrhoidectomy surgery. The hemorrhoids are surgically removed, and the veins that supply them are tied off.  Stapled hemorrhoidopexy surgery. A circular stapling device is used to remove the hemorrhoids and use staples to cut off the blood supply to them.  Follow these instructions at home: Eating and drinking  Eat foods that have a lot of fiber in them, such as whole grains, beans, nuts, fruits, and vegetables. Ask your health care provider about taking products that have added fiber (fiber supplements).  Drink enough fluid to keep your urine clear or pale yellow. Managing pain and swelling  Take warm sitz baths for 20 minutes, 3-4 times a day to ease pain and discomfort.  If directed, apply ice to the affected area. Using ice packs between sitz baths may be helpful. ? Put ice in a plastic bag. ? Place a towel between your skin and the bag. ? Leave the ice on for 20 minutes, 2-3 times a day. General instructions  Take over-the-counter and prescription medicines only  as told by your health care provider.  Use medicated creams or suppositories as told.  Exercise regularly.  Go to the bathroom when you have the urge to have a bowel movement. Do not wait.  Avoid straining to have bowel movements.  Keep the anal area dry and clean. Use wet toilet paper or moist towelettes after a bowel movement.  Do not sit on the toilet for long periods of time. This  increases blood pooling and pain. Contact a health care provider if:  You have increasing pain and swelling that are not controlled by treatment or medicine.  You have uncontrolled bleeding.  You have difficulty having a bowel movement, or you are unable to have a bowel movement.  You have pain or inflammation outside the area of the hemorrhoids. This information is not intended to replace advice given to you by your health care provider. Make sure you discuss any questions you have with your health care provider. Document Released: 09/08/2000 Document Revised: 02/09/2016 Document Reviewed: 05/26/2015 Elsevier Interactive Patient Education  2017 [employer].

## 2017-06-24 ENCOUNTER — Encounter (HOSPITAL_COMMUNITY): Payer: Self-pay | Admitting: Emergency Medicine

## 2017-06-24 ENCOUNTER — Emergency Department (HOSPITAL_COMMUNITY)
Admission: EM | Admit: 2017-06-24 | Discharge: 2017-06-24 | Disposition: A | Discharge status: Home / Self Care | Payer: Medicaid Other | Attending: Emergency Medicine | Admitting: Emergency Medicine

## 2017-06-24 DIAGNOSIS — R112 Nausea with vomiting, unspecified: Secondary | ICD-10-CM | POA: Insufficient documentation

## 2017-06-24 DIAGNOSIS — R1084 Generalized abdominal pain: Secondary | ICD-10-CM | POA: Diagnosis not present

## 2017-06-24 DIAGNOSIS — R197 Diarrhea, unspecified: Secondary | ICD-10-CM | POA: Diagnosis not present

## 2017-06-24 DIAGNOSIS — R531 Weakness: Secondary | ICD-10-CM | POA: Insufficient documentation

## 2017-06-24 DIAGNOSIS — E860 Dehydration: Secondary | ICD-10-CM | POA: Diagnosis not present

## 2017-06-24 DIAGNOSIS — R42 Dizziness and giddiness: Secondary | ICD-10-CM | POA: Insufficient documentation

## 2017-06-24 LAB — COMPREHENSIVE METABOLIC PANEL
ALT: 27 U/L (ref 14–54)
AST: 22 U/L (ref 15–41)
Albumin: 2.9 g/dL — ABNORMAL LOW (ref 3.5–5.0)
Alkaline Phosphatase: 132 U/L — ABNORMAL HIGH (ref 38–126)
Anion gap: 10 (ref 5–15)
BUN: 9 mg/dL (ref 6–20)
CO2: 28 mmol/L (ref 22–32)
Calcium: 8.8 mg/dL — ABNORMAL LOW (ref 8.9–10.3)
Chloride: 100 mmol/L — ABNORMAL LOW (ref 101–111)
Creatinine, Ser: 0.81 mg/dL (ref 0.44–1.00)
GFR calc Af Amer: >60 mL/min (ref >60)
GFR calc non Af Amer: >60 mL/min (ref >60)
Glucose, Bld: 98 mg/dL (ref 65–99)
Potassium: 2.9 mmol/L — ABNORMAL LOW (ref 3.5–5.1)
Sodium: 138 mmol/L (ref 135–145)
Total Bilirubin: 0.6 mg/dL (ref 0.3–1.2)
Total Protein: 7.9 g/dL (ref 6.5–8.1)

## 2017-06-24 LAB — WET PREP, GENITAL
Clue Cells Wet Prep HPF POC: NONE SEEN
Sperm: NONE SEEN
Trich, Wet Prep: NONE SEEN
Yeast Wet Prep HPF POC: NONE SEEN

## 2017-06-24 LAB — URINALYSIS, ROUTINE W REFLEX MICROSCOPIC
Bacteria, UA: NONE SEEN
Glucose, UA: NEGATIVE mg/dL
Hgb urine dipstick: NEGATIVE
Ketones, ur: NEGATIVE mg/dL
Nitrite: NEGATIVE
Protein, ur: 100 mg/dL — AB
Specific Gravity, Urine: 1.034 — ABNORMAL HIGH (ref 1.005–1.030)
pH: 5 (ref 5.0–8.0)

## 2017-06-24 LAB — CBC
HCT: 35 % — ABNORMAL LOW (ref 36.0–46.0)
Hemoglobin: 11.4 g/dL — ABNORMAL LOW (ref 12.0–15.0)
MCH: 25.3 pg — ABNORMAL LOW (ref 26.0–34.0)
MCHC: 32.6 g/dL (ref 30.0–36.0)
MCV: 77.8 fL — ABNORMAL LOW (ref 78.0–100.0)
Platelets: 628 10*3/uL — ABNORMAL HIGH (ref 150–400)
RBC: 4.5 MIL/uL (ref 3.87–5.11)
RDW: 14.3 % (ref 11.5–15.5)
WBC: 13.5 10*3/uL — ABNORMAL HIGH (ref 4.0–10.5)

## 2017-06-24 LAB — C DIFFICILE QUICK SCREEN W PCR REFLEX
C Diff antigen: NEGATIVE
C Diff interpretation: NOT DETECTED
C Diff toxin: NEGATIVE

## 2017-06-24 LAB — LIPASE, BLOOD: Lipase: 16 U/L (ref 11–51)

## 2017-06-24 LAB — MAGNESIUM: Magnesium: 1.9 mg/dL (ref 1.7–2.4)

## 2017-06-24 LAB — POC URINE PREG, ED: Preg Test, Ur: NEGATIVE

## 2017-06-24 MED ORDER — ONDANSETRON 4 MG PO TBDP: 4.0000 mg | ORAL_TABLET | Freq: Three times a day (TID) | ORAL | 0 refills | Status: DC | PRN

## 2017-06-24 MED ORDER — HYDROCODONE-ACETAMINOPHEN 5-325 MG PO TABS
1.0000 | ORAL_TABLET | Freq: Once | ORAL | Status: AC
  Administered 2017-06-24: 1 via ORAL
  Filled 2017-06-24: qty 1

## 2017-06-24 MED ORDER — ONDANSETRON HCL 4 MG/2ML IJ SOLN
4.0000 mg | Freq: Once | INTRAMUSCULAR | Status: AC
  Administered 2017-06-24: 4 mg via INTRAVENOUS
  Filled 2017-06-24: qty 2

## 2017-06-24 MED ORDER — MAGNESIUM OXIDE 400 (241.3 MG) MG PO TABS
800.0000 mg | ORAL_TABLET | Freq: Once | ORAL | Status: AC
  Administered 2017-06-24: 800 mg via ORAL
  Filled 2017-06-24 (×2): qty 2

## 2017-06-24 MED ORDER — POTASSIUM CHLORIDE CRYS ER 20 MEQ PO TBCR
40.0000 meq | EXTENDED_RELEASE_TABLET | Freq: Once | ORAL | Status: AC
  Administered 2017-06-24: 40 meq via ORAL
  Filled 2017-06-24: qty 2

## 2017-06-24 MED ORDER — LACTATED RINGERS IV BOLUS (SEPSIS)
1000.0000 mL | Freq: Once | INTRAVENOUS | Status: AC
  Administered 2017-06-24: 1000 mL via INTRAVENOUS

## 2017-06-24 MED ORDER — LOPERAMIDE HCL 2 MG PO CAPS: 4.0000 mg | ORAL_CAPSULE | Freq: Every day | ORAL | 0 refills | Status: DC

## 2017-06-24 MED ORDER — DICYCLOMINE HCL 20 MG PO TABS: 20.0000 mg | ORAL_TABLET | Freq: Two times a day (BID) | ORAL | 0 refills | Status: DC

## 2017-06-24 NOTE — ED Notes (Signed)
This RN attempted IV access x2 without success.  Will have another RN look.

## 2017-06-24 NOTE — ED Notes (Signed)
Pt ambulatory and independent at discharge.  Mother picking up patient. Verbalized understanding of discharge instructions.

## 2017-06-24 NOTE — ED Notes (Signed)
Unable to find magnesium medication.  Pharmacy to resend it.

## 2017-06-24 NOTE — ED Notes (Signed)
Pt given ginger ale and saltine crackers.

## 2017-06-24 NOTE — ED Provider Notes (Signed)
[facility] DEPT Provider Note   CSN: [id number] Arrival date & time: 06/24/17  1436     History   Chief Complaint Chief Complaint  Patient presents with  . Abdominal Pain  . Diarrhea    HPI Latoya [name] is a 26 y.o. female.  HPI Pt comes in with cc of abd pain, nausea and emesis. Pt also has been having weakness, and dizziness. Pt is 26 y.o. G2P2002 6 weeks postpartum. She had normal pregnancy but delivery was complicated by a vaginal tear. Pt also has had hemorrhoids that has continued to give her problems. Pt has subjective temp. She has abd pain, it is in the lower quadrants. Pt has associated nausea and emesis, with emesis x 5, non bilious today. Pt also has 5-10 loose BM daily, no blood. Pt's emesis and diarrhea have been present for a week, abd pain x 2 weeks. Pt has taken amoxicillin and current on keflex. Patient has no pain with urination, blood in the urine, or frequent urination. Pt also denies any vaginal discharge or bleeding.  Past Medical History:  Diagnosis Date  . Anemia   . Anxiety   . Depression   . Gestational diabetes   . History of substance abuse   . Pregnancy induced hypertension     Patient Active Problem List   Diagnosis Date Noted  . SVD (spontaneous vaginal delivery) 05/15/2017  . Crack cocaine use 05/15/2017  . Cervical dysplasia 01/16/2017  . Obesity (BMI 30.0-34.9) 01/12/2017  . History of substance abuse 01/12/2017  . History of gestational diabetes mellitus (GDM) 01/12/2017  . History of gestational hypertension 01/12/2017  . History of macrosomia in infant in prior pregnancy, currently pregnant 01/12/2017  . Short interval between pregnancies affecting pregnancy in second trimester, antepartum 01/12/2017  . Late prenatal care 01/12/2017  . Paxil use in early pregnancy 01/12/2017  . History of suicide attempt 01/12/2017  . Anxiety and depression 01/12/2017  . Supervision of high risk pregnancy, antepartum 01/10/2017  . GDM  (gestational diabetes mellitus) 02/25/2016  . Obesity in pregnancy 02/25/2016    Past Surgical History:  Procedure Laterality Date  . DEEP NECK LYMPH NODE BIOPSY / EXCISION  2011    OB History    Gravida Para Term Preterm AB Living   2 2 2  0 0 2   SAB TAB Ectopic Multiple Live Births   0 0 0 0 2       Home Medications    Prior to Admission medications   Medication Sig Start Date End Date Taking? Authorizing Provider  acetaminophen (TYLENOL) 500 MG tablet Take 1,000 mg by mouth every 6 (six) hours as needed for mild pain, moderate pain, fever or headache.     [provider]  clobetasol cream (TEMOVATE) 8.76 % Apply 1 application topically 2 (two) times daily. 06/22/17   Starr Lake, CNM  diphenhydramine-acetaminophen (TYLENOL PM) 25-500 MG TABS tablet Take 1 tablet by mouth at bedtime as needed (sleep and pain).     [provider]  hydrocortisone (ANUSOL-HC) 25 MG suppository Place 1 suppository (25 mg total) rectally 2 (two) times daily. 06/17/17   Marcille Buffy D, CNM  loperamide (IMODIUM) 2 MG capsule Take 2 capsules (4 mg total) by mouth at bedtime. 06/24/17   Varney Biles, MD  naproxen (NAPROSYN) 500 MG tablet Take 1 tablet (500 mg total) by mouth 2 (two) times daily. 06/17/17   Marcille Buffy D, CNM  ondansetron (ZOFRAN ODT) 4 MG disintegrating tablet Take 1 tablet (  4 mg total) by mouth every 8 (eight) hours as needed for nausea or vomiting. 06/24/17   Varney Biles, MD  ondansetron (ZOFRAN) 4 MG tablet Take 4 mg by mouth daily. 04/08/17   [provider]  PARoxetine (PAXIL-CR) 25 MG 24 hr tablet Take 1 tablet (25 mg total) by mouth daily. 05/16/17   Emily Filbert, MD  potassium chloride SA (K-DUR,KLOR-CON) 20 MEQ tablet Take 2 tablets (40 mEq total) by mouth 2 (two) times daily. 06/22/17 06/25/17  Starr Lake, CNM  traMADol (ULTRAM) 50 MG tablet Take 1 tablet (50 mg total) by mouth every 6 (six) hours as needed. 06/17/17    Tresea Mall, CNM    Family History Family History  Problem Relation Age of Onset  . Diabetes Mother   . Hypertension Mother   . Hypertension Father   . Stroke Maternal Grandfather     Social History Social History  Substance Use Topics  . Smoking status: Never Smoker  . Smokeless tobacco: Never Used  . Alcohol use No     Comment: "not for a while"     Allergies   Patient has no known allergies.   Review of Systems Review of Systems  Constitutional: Positive for activity change.  Gastrointestinal: Positive for abdominal pain, diarrhea, nausea and vomiting.  Allergic/Immunologic: Negative for immunocompromised state.  Neurological: Positive for weakness.  All other systems reviewed and are negative.    Physical Exam Updated Vital Signs BP 110/66 (BP Location: Left Arm)   Pulse (!) 110   Temp 98.2 F (36.8 C) (Oral)   Resp 16   LMP 06/24/2017   SpO2 99%   Physical Exam  Constitutional: She is oriented to person, place, and time. She appears well-developed.  HENT:  Head: Normocephalic and atraumatic.  Eyes: Pupils are equal, round, and reactive to light. Conjunctivae and EOM are normal.  Neck: Normal range of motion. Neck supple.  Cardiovascular: Regular rhythm, normal heart sounds and intact distal pulses.   tachycardia  Pulmonary/Chest: Effort normal. No respiratory distress. She has no wheezes.  Abdominal: Soft. Bowel sounds are normal. She exhibits no distension. There is tenderness. There is no rebound and no guarding.  Generalized tenderness  Genitourinary: Vagina normal and uterus normal.  Genitourinary Comments: External exam - normal, no lesions.  Pt does have large hemorrhoids. Speculum exam: Pt has some white discharge, no blood Bimanual exam: Patient has no CMT, + adnexal tenderness but cervical os is closed  Neurological: She is alert and oriented to person, place, and time.  Skin: Skin is warm and dry.  Nursing note and vitals  reviewed.    ED Treatments / Results  Labs (all labs ordered are listed, but only abnormal results are displayed) Labs Reviewed  WET PREP, GENITAL - Abnormal; Notable for the following:       Result Value   WBC, Wet Prep HPF POC RARE (*)    All other components within normal limits  COMPREHENSIVE METABOLIC PANEL - Abnormal; Notable for the following:    Potassium 2.9 (*)    Chloride 100 (*)    Calcium 8.8 (*)    Albumin 2.9 (*)    Alkaline Phosphatase 132 (*)    All other components within normal limits  CBC - Abnormal; Notable for the following:    WBC 13.5 (*)    Hemoglobin 11.4 (*)    HCT 35.0 (*)    MCV 77.8 (*)    MCH 25.3 (*)    Platelets  628 (*)    All other components within normal limits  URINALYSIS, ROUTINE W REFLEX MICROSCOPIC - Abnormal; Notable for the following:    Color, Urine AMBER (*)    APPearance CLOUDY (*)    Specific Gravity, Urine 1.034 (*)    Bilirubin Urine MODERATE (*)    Protein, ur 100 (*)    Leukocytes, UA TRACE (*)    Squamous Epithelial / LPF 0-5 (*)    Non Squamous Epithelial 0-5 (*)    All other components within normal limits  C DIFFICILE QUICK SCREEN W PCR REFLEX  GASTROINTESTINAL PANEL BY PCR, STOOL (REPLACES STOOL CULTURE)  LIPASE, BLOOD  MAGNESIUM  POC URINE PREG, ED  GC/CHLAMYDIA PROBE AMP ([place]) NOT AT [facility]    EKG  EKG Interpretation None       Radiology No results found.  Procedures Procedures (including critical care time)  Medications Ordered in ED Medications  magnesium oxide (MAG-OX) tablet 800 mg (not administered)  lactated ringers bolus 1,000 mL (0 mLs Intravenous Stopped 06/24/17 2212)  lactated ringers bolus 1,000 mL (0 mLs Intravenous Stopped 06/24/17 2212)  potassium chloride SA (K-DUR,KLOR-CON) CR tablet 40 mEq (40 mEq Oral Given 06/24/17 2039)  ondansetron (ZOFRAN) injection 4 mg (4 mg Intravenous Given 06/24/17 2039)  potassium chloride SA (K-DUR,KLOR-CON) CR tablet 40 mEq (40 mEq Oral Given  06/24/17 2135)  HYDROcodone-acetaminophen (NORCO/VICODIN) 5-325 MG per tablet 1 tablet (1 tablet Oral Given 06/24/17 2135)     Initial Impression / Assessment and Plan / ED Course  I have reviewed the triage vital signs and the nursing notes.  Pertinent labs & imaging results that were available during my care of the patient were reviewed by me and considered in my medical decision making (see chart for details).  Clinical Course as of Jun 24 2240  Nancy Fetter Jun 24, 2017  2136 Pelvic exam is overall reassuring. The repeat abd exam is unchanged. Again, he fact that the pain is generalized and not focal makes it less liekly for a surgical etiology. Pt is comfortable with the plan for not getting CT here, especially since she has OB f/u coming up. I informed her that my suspicion for CT findins of anything outside of colitis is low.  [AN]    Clinical Course User Index [AN] Varney Biles, MD    Pt comes in with cc of abd pain, nausea, emesis, diarrhea, weakness. No fevers. WC is marginally elevated. Pain has been present for close to 2 weeks, nausea/emesis/diarrhea for 1 week.  Ddx: colitis, gastroenteritis, sbo. Pt is post partum, 6 weeks  - but exam does reveal any signs of endometritis or pelvic infection.   Pt is tachycardic and has increased urine specific gravity - we will hydrate her. Pt has an OB f/u in 2 days, which is reassuring.  Based on my initial exam, pt doesn't need a CT scan of the abd. We will reassess.   Final Clinical Impressions(s) / ED Diagnoses   Final diagnoses:  Nausea vomiting and diarrhea  Dehydration    New Prescriptions New Prescriptions   LOPERAMIDE (IMODIUM) 2 MG CAPSULE    Take 2 capsules (4 mg total) by mouth at bedtime.   ONDANSETRON (ZOFRAN ODT) 4 MG DISINTEGRATING TABLET    Take 1 tablet (4 mg total) by mouth every 8 (eight) hours as needed for nausea or vomiting.     Varney Biles, MD 06/24/17 2241

## 2017-06-24 NOTE — Discharge Instructions (Signed)
All the results in the ER are normal. We are not sure what is causing your symptoms. At this time we dont see indication for CT scan of the abdomen.  The workup in the ER is not complete, and is limited to screening for life threatening and emergent conditions only, so please see a primary care doctor for further evaluation.  Please return to the ER if your symptoms worsen; you have increased pain, fevers, chills, inability to keep any medications down, confusion. Otherwise see the outpatient doctor as requested.

## 2017-06-24 NOTE — ED Notes (Signed)
Fluids not completely finished.  Patient would like to get full amount of fluids before discharge.

## 2017-06-24 NOTE — ED Notes (Signed)
ED Provider at bedside. 

## 2017-06-24 NOTE — ED Triage Notes (Signed)
Patient c/o abdominal pain with N/V/D and cold sweats x1 week. Reports watery and yellow stool. States she was seen by midwife x3 days ago and prescribed potassium. Denies chest pain and SOB.

## 2017-06-25 ENCOUNTER — Institutional Professional Consult (permissible substitution): Payer: Self-pay

## 2017-06-25 LAB — GASTROINTESTINAL PANEL BY PCR, STOOL (REPLACES STOOL CULTURE)

## 2017-06-26 ENCOUNTER — Ambulatory Visit: Payer: Self-pay | Admitting: Clinical

## 2017-06-26 ENCOUNTER — Ambulatory Visit (INDEPENDENT_AMBULATORY_CARE_PROVIDER_SITE_OTHER): Payer: Medicaid Other | Admitting: Obstetrics and Gynecology

## 2017-06-26 ENCOUNTER — Other Ambulatory Visit (HOSPITAL_COMMUNITY)
Admission: RE | Admit: 2017-06-26 | Discharge: 2017-06-26 | Disposition: A | Discharge status: Home / Self Care | Payer: Medicaid Other | Source: Ambulatory Visit | Attending: Obstetrics and Gynecology | Admitting: Obstetrics and Gynecology

## 2017-06-26 ENCOUNTER — Encounter: Payer: Self-pay | Admitting: Obstetrics and Gynecology

## 2017-06-26 VITALS — BP 119/70 | HR 112 | Ht 69.0 in | Wt 186.0 lb

## 2017-06-26 DIAGNOSIS — R69 Illness, unspecified: Secondary | ICD-10-CM

## 2017-06-26 DIAGNOSIS — O99345 Other mental disorders complicating the puerperium: Secondary | ICD-10-CM

## 2017-06-26 DIAGNOSIS — F53 Postpartum depression: Secondary | ICD-10-CM

## 2017-06-26 DIAGNOSIS — N898 Other specified noninflammatory disorders of vagina: Secondary | ICD-10-CM | POA: Insufficient documentation

## 2017-06-26 DIAGNOSIS — K642 Third degree hemorrhoids: Secondary | ICD-10-CM

## 2017-06-26 LAB — GC/CHLAMYDIA PROBE AMP (~~LOC~~) NOT AT ARMC
Chlamydia: NEGATIVE
Neisseria Gonorrhea: NEGATIVE

## 2017-06-26 MED ORDER — NITROGLYCERIN 2 % TD OINT: 0.5000 [in_us] | TOPICAL_OINTMENT | Freq: Two times a day (BID) | TRANSDERMAL | 0 refills | Status: DC

## 2017-06-26 MED ORDER — VALACYCLOVIR HCL 1 G PO TABS: 1000.0000 mg | ORAL_TABLET | Freq: Two times a day (BID) | ORAL | 0 refills | Status: AC

## 2017-06-26 MED ORDER — LIDOCAINE HCL 2 % EX GEL: 1.0000 "application " | CUTANEOUS | 0 refills | Status: DC | PRN

## 2017-06-26 NOTE — Patient Instructions (Signed)
Genital Herpes Genital herpes is a common sexually transmitted infection (STI) that is caused by a virus. The virus spreads from person to person through sexual contact. Infection can cause itching, blisters, and sores around the genitals or rectum. Symptoms may last several days and then go away This is called an outbreak. However, the virus remains in your body, so you may have more outbreaks in the future. The time between outbreaks varies and can be months or years. Genital herpes affects men and women. It is particularly concerning for pregnant women because the virus can be passed to the baby during delivery and can cause serious problems. Genital herpes is also a concern for people who have a weak disease-fighting (immune) system. What are the causes? This condition is caused by the herpes simplex virus (HSV) type 1 or type 2. The virus may spread through:  Sexual contact with an infected person, including vaginal, anal, and oral sex.  Contact with fluid from a herpes sore.  The skin. This means that you can get herpes from an infected partner even if he or she does not have a visible sore or does not know that he or she is infected. What increases the risk? You are more likely to develop this condition if:  You have sex with many partners.  You do not use latex condoms during sex. What are the signs or symptoms? Most people do not have symptoms (asymptomatic) or have mild symptoms that may be mistaken for other skin problems. Symptoms may include:  Small red bumps near the genitals, rectum, or mouth. These bumps turn into blisters and then turn into sores.  Flu-like symptoms, including:  Fever.  Body aches.  Swollen lymph nodes.  Headache.  Painful urination.  Pain and itching in the genital area or rectal area.  Vaginal discharge.  Tingling or shooting pain in the legs and buttocks. Generally, symptoms are more severe and last longer during the first (primary)  outbreak. Flu-like symptoms are also more common during the primary outbreak. How is this diagnosed? Genital herpes may be diagnosed based on:  A physical exam.  Your medical history.  Blood tests.  A test of a fluid sample (culture) from an open sore. How is this treated? There is no cure for this condition, but treatment with antiviral medicines that are taken by mouth (orally) can do the following:  Speed up healing and relieve symptoms.  Help to reduce the spread of the virus to sexual partners.  Limit the chance of future outbreaks, or make future outbreaks shorter.  Lessen symptoms of future outbreaks. Your health care provider may also recommend pain relief medicines, such as aspirin or ibuprofen. Follow these instructions at home: Sexual activity   Do not have sexual contact during active outbreaks.  Practice safe sex. Latex condoms and female condoms may help prevent the spread of the herpes virus. General instructions   Keep the affected areas dry and clean.  Take over-the-counter and prescription medicines only as told by your health care provider.  Avoid rubbing or touching blisters and sores. If you do touch blisters or sores:  Wash your hands thoroughly with soap and water.  Do not touch your eyes afterward.  To help relieve pain or itching, you may take the following actions as directed by your health care provider:  Apply a cold, wet cloth (cold compress) to affected areas 4-6 times a day.  Apply a substance that protects your skin and reduces bleeding (astringent).  Apply a   gel that helps relieve pain around sores (lidocaine gel).  Take a warm, shallow bath that cleans the genital area (sitz bath).  Keep all follow-up visits as told by your health care provider. This is important. How is this prevented?  Use condoms. Although anyone can get genital herpes during sexual contact, even with the use of a condom, a condom can provide some  protection.  Avoid having multiple sexual partners.  Talk with your sexual partner about any symptoms either of you may have. Also, talk with your partner about any history of STIs.  Get tested for STIs before you have sex. Ask your partner to do the same.  Do not have sexual contact if you have symptoms of genital herpes. Contact a health care provider if:  Your symptoms are not improving with medicine.  Your symptoms return.  You have new symptoms.  You have a fever.  You have abdominal pain.  You have redness, swelling, or pain in your eye.  You notice new sores on other parts of your body.  You are a woman and experience bleeding between menstrual periods.  You have had herpes and you become pregnant or plan to become pregnant. Summary  Genital herpes is a common sexually transmitted infection (STI) that is caused by the herpes simplex virus (HSV) type 1 or type 2.  These viruses are most often spread through sexual contact with an infected person.  You are more likely to develop this condition if you have sex with many partners or you have unprotected sex.  Most people do not have symptoms (asymptomatic) or have mild symptoms that may be mistaken for other skin problems. Symptoms occur as outbreaks that may happen months or years apart.  There is no cure for this condition, but treatment with oral antiviral medicines can reduce symptoms, reduce the chance of spreading the virus to a partner, prevent future outbreaks, or shorten future outbreaks. This information is not intended to replace advice given to you by your health care provider. Make sure you discuss any questions you have with your health care provider. Document Released: 09/08/2000 Document Revised: 08/11/2016 Document Reviewed: 08/11/2016 Elsevier Interactive Patient Education  2017 [employer]  

## 2017-06-26 NOTE — BH Specialist Note (Signed)
Pt stated " I don't want to talk" while hyperventilating and crying. When asked if she drove herself to the appointment today, she said "yes". When asked if she would like for [place] to call a friend or family member to pick her up, so that she could get home safely, she said "I want to drive off a cliff", and "I don't want to be here anymore". She continued to cry, and was inconsolable. Pt did not wish to speak further.  Depression screen [facility] 2/9 04/19/2017 03/21/2017 02/06/2017 01/10/2017  Decreased Interest 1 2 2 2   Down, Depressed, Hopeless 1 2 2 2   PHQ - 2 Score 2 4 4 4   Altered sleeping 3 1 1 3   Tired, decreased energy 2 2 2 2   Change in appetite 0 0 0 0  Feeling bad or failure about yourself  1 0 1 2  Trouble concentrating 0 0 0 2  Moving slowly or fidgety/restless 0 0 0 0  Suicidal thoughts 0 0 2 2  PHQ-9 Score 8 7 10 15    GAD 7 : Generalized Anxiety Score 04/19/2017 03/21/2017 02/06/2017 01/10/2017  Nervous, Anxious, on Edge 1 2 2 2   Control/stop worrying 1 2 2 2   Worry too much - different things 1 1 2 3   Trouble relaxing 1 1 2 2   Restless 1 2 0 2  Easily annoyed or irritable 1 0 2 2  Afraid - awful might happen 1 0 2 2  Total GAD 7 Score 7 8 12  15

## 2017-06-26 NOTE — Progress Notes (Signed)
Subjective:     Latoya [name] is a 26 y.o. female who presents for a postpartum visit. She is 5 weeks postpartum following a spontaneous vaginal delivery. I have fully reviewed the prenatal and intrapartum course. The delivery was at 2 gestational weeks. Outcome: spontaneous vaginal delivery. Anesthesia: epidural. Postpartum course has been complicated Baby's course has been uncomplicated. Baby is feeding by bottle - Similac Advance. Bleeding red. Bowel function is Normal. Bladder function is normal. Patient is not sexually active. Contraception method is abstinence. Postpartum depression screening: positive. Patient was started on Paxil however stopped taking it due to the thought that it was causing her vaginal pain. States the pain in her vagina has gotten worse since delivery. States it hurts when urine touches her vagina, and when she wipes. States she is not helping to care for her baby; states her brother has the baby. States her life has been "very messed up".   The following portions of the patient's history were reviewed and updated as appropriate: allergies, current medications, past family history, past medical history, past social history, past surgical history and problem list.  Review of Systems Behavioral/Psych: positive for anxiety, depression, irritability and loss of interest in favorite activities, negative for excessive alcohol consumption and illegal drug usage   Objective:    BP 119/70   Pulse (!) 112   Ht 5' 9"  (1.753 m)   Wt 186 lb (84.4 kg)   LMP  (LMP Unknown)   BMI 27.47 kg/m     General:  alert, moderate distress and uncooperative   Breasts:  inspection negative, no nipple discharge or bleeding, no masses or nodularity palpable  Lungs: clear to auscultation bilaterally  Heart:  regular rate and rhythm, S1, S2 normal, no murmur, click, rub or gallop  Abdomen: soft, non-tender; bowel sounds normal; no masses,  no organomegaly   Vulva:  positive for erythema  bilaterally;questionable herpetic appearing lesions.   Vagina: vagina positive for ulcerations small amount of dark red blood noted   Cervix:  no lesions  Corpus: normal  Adnexa:  normal adnexa  Rectal Exam: Not performed.     Assessment:   Abnormal postpartum exam. Pap smear not done at today's visit. Pap done 4/18   ATYPICAL SQUAMOUS CELLS OF UNDETERMINED SIGNIFICANCE (ASC-US).    HPV DETECTED     - GDM- patient not fasting today; needs further glucose testing.  - Severe depression with thoughts of harm to self. Patient is hyperventilating and crying inconsolably.  -Vaginal irritation likely primary HSV outbreak; vaginal cultures collected -severe hemorrhoids, non-thrombosed   Plan:   1. Contraception: none patient desires IUD however unable to stay today.  2. Plan for repeat PAP in 1 year.  3. Per [name] behavioral specialist the patient is showing signs of severe depression with thoughts of harm. The patient told [name] that she "wants to drive off of a cliff".  It was recommended to the patient that she stay for further evaluation with psych. Patient is hyperventilating and appears to be in emotional distress. Hospital security officer called for assistance with helping the patient stay until a plan was in place. Patient stormed out of the clinic abruptly stating "you cannot make me stay". [employer] called for assistance.  4. Rx for Valtrex, lidocaine, and nitro cream given.  5. Referral to CC surgery 6. Patient needs psychiatric evaluation immediately.    Lezlie Lye, NP 06/27/2017 7:26 PM

## 2017-06-27 LAB — CERVICOVAGINAL ANCILLARY ONLY
Bacterial vaginitis: NEGATIVE
Candida vaginitis: POSITIVE — AB
Chlamydia: NEGATIVE
Neisseria Gonorrhea: NEGATIVE
Trichomonas: NEGATIVE

## 2017-06-28 ENCOUNTER — Other Ambulatory Visit: Payer: Self-pay | Admitting: Student

## 2017-06-28 ENCOUNTER — Encounter (HOSPITAL_COMMUNITY): Payer: Self-pay | Admitting: Emergency Medicine

## 2017-06-28 ENCOUNTER — Telehealth: Payer: Self-pay | Admitting: *Deleted

## 2017-06-28 ENCOUNTER — Emergency Department (HOSPITAL_COMMUNITY)
Admission: EM | Admit: 2017-06-28 | Discharge: 2017-06-30 | Disposition: A | Discharge status: Home / Self Care | Payer: Medicaid Other | Attending: Emergency Medicine | Admitting: Emergency Medicine

## 2017-06-28 DIAGNOSIS — B009 Herpesviral infection, unspecified: Secondary | ICD-10-CM | POA: Diagnosis not present

## 2017-06-28 DIAGNOSIS — F1490 Cocaine use, unspecified, uncomplicated: Secondary | ICD-10-CM | POA: Diagnosis present

## 2017-06-28 DIAGNOSIS — F332 Major depressive disorder, recurrent severe without psychotic features: Secondary | ICD-10-CM | POA: Diagnosis present

## 2017-06-28 DIAGNOSIS — Z79899 Other long term (current) drug therapy: Secondary | ICD-10-CM | POA: Insufficient documentation

## 2017-06-28 DIAGNOSIS — F53 Postpartum depression: Secondary | ICD-10-CM | POA: Insufficient documentation

## 2017-06-28 DIAGNOSIS — O99325 Drug use complicating the puerperium: Secondary | ICD-10-CM | POA: Insufficient documentation

## 2017-06-28 DIAGNOSIS — R4587 Impulsiveness: Secondary | ICD-10-CM | POA: Diagnosis not present

## 2017-06-28 DIAGNOSIS — R45851 Suicidal ideations: Secondary | ICD-10-CM | POA: Diagnosis not present

## 2017-06-28 DIAGNOSIS — F1410 Cocaine abuse, uncomplicated: Secondary | ICD-10-CM | POA: Diagnosis present

## 2017-06-28 DIAGNOSIS — O99345 Other mental disorders complicating the puerperium: Secondary | ICD-10-CM

## 2017-06-28 DIAGNOSIS — F419 Anxiety disorder, unspecified: Secondary | ICD-10-CM | POA: Diagnosis not present

## 2017-06-28 LAB — I-STAT TROPONIN, ED: Troponin i, poc: 0 ng/mL (ref 0.00–0.08)

## 2017-06-28 LAB — RAPID URINE DRUG SCREEN, HOSP PERFORMED
Amphetamines: NOT DETECTED
Barbiturates: NOT DETECTED
Benzodiazepines: NOT DETECTED
Cocaine: POSITIVE — AB
Opiates: POSITIVE — AB
Tetrahydrocannabinol: NOT DETECTED

## 2017-06-28 LAB — COMPREHENSIVE METABOLIC PANEL
ALT: 15 U/L (ref 14–54)
AST: 14 U/L — ABNORMAL LOW (ref 15–41)
Albumin: 2.7 g/dL — ABNORMAL LOW (ref 3.5–5.0)
Alkaline Phosphatase: 112 U/L (ref 38–126)
Anion gap: 11 (ref 5–15)
BUN: 11 mg/dL (ref 6–20)
CO2: 25 mmol/L (ref 22–32)
Calcium: 8.7 mg/dL — ABNORMAL LOW (ref 8.9–10.3)
Chloride: 103 mmol/L (ref 101–111)
Creatinine, Ser: 0.9 mg/dL (ref 0.44–1.00)
GFR calc Af Amer: >60 mL/min (ref >60)
GFR calc non Af Amer: >60 mL/min (ref >60)
Glucose, Bld: 121 mg/dL — ABNORMAL HIGH (ref 65–99)
Potassium: 3 mmol/L — ABNORMAL LOW (ref 3.5–5.1)
Sodium: 139 mmol/L (ref 135–145)
Total Bilirubin: 0.5 mg/dL (ref 0.3–1.2)
Total Protein: 7.4 g/dL (ref 6.5–8.1)

## 2017-06-28 LAB — CBC
HCT: 32.3 % — ABNORMAL LOW (ref 36.0–46.0)
Hemoglobin: 10 g/dL — ABNORMAL LOW (ref 12.0–15.0)
MCH: 24.5 pg — ABNORMAL LOW (ref 26.0–34.0)
MCHC: 31 g/dL (ref 30.0–36.0)
MCV: 79.2 fL (ref 78.0–100.0)
Platelets: 611 10*3/uL — ABNORMAL HIGH (ref 150–400)
RBC: 4.08 MIL/uL (ref 3.87–5.11)
RDW: 15 % (ref 11.5–15.5)
WBC: 11.8 10*3/uL — ABNORMAL HIGH (ref 4.0–10.5)

## 2017-06-28 LAB — ETHANOL: Alcohol, Ethyl (B): <10 mg/dL (ref <10)

## 2017-06-28 LAB — SALICYLATE LEVEL: Salicylate Lvl: <7 mg/dL (ref 2.8–30.0)

## 2017-06-28 LAB — I-STAT BETA HCG BLOOD, ED (MC, WL, AP ONLY): I-stat hCG, quantitative: <5 m[IU]/mL (ref <5)

## 2017-06-28 LAB — ACETAMINOPHEN LEVEL: Acetaminophen (Tylenol), Serum: 14 ug/mL (ref 10–30)

## 2017-06-28 MED ORDER — HYDROCORTISONE ACETATE 25 MG RE SUPP
25.0000 mg | Freq: Two times a day (BID) | RECTAL | Status: DC
  Administered 2017-06-29: 25 mg via RECTAL
  Filled 2017-06-28 (×5): qty 1

## 2017-06-28 MED ORDER — DIPHENHYDRAMINE HCL 25 MG PO CAPS
25.0000 mg | ORAL_CAPSULE | Freq: Every evening | ORAL | Status: DC | PRN
  Filled 2017-06-28: qty 1

## 2017-06-28 MED ORDER — LIDOCAINE HCL 2 % EX GEL: 1.0000 "application " | CUTANEOUS | 2 refills | Status: DC | PRN

## 2017-06-28 MED ORDER — PAROXETINE HCL ER 25 MG PO TB24
25.0000 mg | ORAL_TABLET | Freq: Every day | ORAL | Status: DC
  Administered 2017-06-29 – 2017-06-30 (×2): 25 mg via ORAL
  Filled 2017-06-28 (×2): qty 1

## 2017-06-28 MED ORDER — OXYCODONE-ACETAMINOPHEN 5-325 MG PO TABS: 1.0000 | ORAL_TABLET | Freq: Four times a day (QID) | ORAL | 0 refills | Status: DC | PRN

## 2017-06-28 MED ORDER — LIDOCAINE 5 % EX OINT
TOPICAL_OINTMENT | Freq: Three times a day (TID) | CUTANEOUS | Status: DC | PRN
  Administered 2017-06-28 – 2017-06-30 (×6): via TOPICAL
  Filled 2017-06-28 (×2): qty 35.44

## 2017-06-28 MED ORDER — NAPROXEN 500 MG PO TABS
500.0000 mg | ORAL_TABLET | Freq: Two times a day (BID) | ORAL | Status: DC
  Administered 2017-06-28 – 2017-06-30 (×4): 500 mg via ORAL
  Filled 2017-06-28 (×4): qty 1

## 2017-06-28 MED ORDER — LIDOCAINE 4 % EX CREA: TOPICAL_CREAM | Freq: Three times a day (TID) | CUTANEOUS | Status: DC | PRN

## 2017-06-28 MED ORDER — DICYCLOMINE HCL 20 MG PO TABS
20.0000 mg | ORAL_TABLET | Freq: Two times a day (BID) | ORAL | Status: DC
  Administered 2017-06-28 – 2017-06-30 (×4): 20 mg via ORAL
  Filled 2017-06-28 (×4): qty 1

## 2017-06-28 MED ORDER — ACETAMINOPHEN 500 MG PO TABS: 1000.0000 mg | ORAL_TABLET | Freq: Four times a day (QID) | ORAL | Status: DC | PRN

## 2017-06-28 MED ORDER — SERTRALINE HCL 50 MG PO TABS
50.0000 mg | ORAL_TABLET | Freq: Two times a day (BID) | ORAL | Status: DC
  Administered 2017-06-28 – 2017-06-29 (×2): 50 mg via ORAL
  Filled 2017-06-28 (×3): qty 1

## 2017-06-28 MED ORDER — POTASSIUM CHLORIDE CRYS ER 20 MEQ PO TBCR
40.0000 meq | EXTENDED_RELEASE_TABLET | Freq: Once | ORAL | Status: AC
  Administered 2017-06-28: 40 meq via ORAL
  Filled 2017-06-28: qty 2

## 2017-06-28 MED ORDER — DIPHENHYDRAMINE-APAP (SLEEP) 25-500 MG PO TABS: 1.0000 | ORAL_TABLET | Freq: Every evening | ORAL | Status: DC | PRN

## 2017-06-28 MED ORDER — VALACYCLOVIR HCL 500 MG PO TABS
1000.0000 mg | ORAL_TABLET | Freq: Two times a day (BID) | ORAL | Status: DC
  Administered 2017-06-28 – 2017-06-30 (×4): 1000 mg via ORAL
  Filled 2017-06-28 (×4): qty 2

## 2017-06-28 MED ORDER — DIPHENHYDRAMINE HCL 25 MG PO CAPS
25.0000 mg | ORAL_CAPSULE | Freq: Once | ORAL | Status: AC
  Administered 2017-06-28: 25 mg via ORAL
  Filled 2017-06-28: qty 1

## 2017-06-28 MED ORDER — ACETAMINOPHEN 500 MG PO TABS
500.0000 mg | ORAL_TABLET | Freq: Every evening | ORAL | Status: DC | PRN
  Filled 2017-06-28: qty 1

## 2017-06-28 NOTE — Progress Notes (Unsigned)
Patient requesting pain medication. 5-325 percocet with 6 pills and no refills ordered.

## 2017-06-28 NOTE — ED Notes (Signed)
Bed: WA17 Expected date:  Expected time:  Means of arrival:  Comments: Triage 3

## 2017-06-28 NOTE — Telephone Encounter (Signed)
Patient left message on my voicemail requesting a refill on the lidocaine 2% jelly.    Attempted to contact patient.  No answer.  Left message to please return my call.  Patient received a refill on the lindocaine when she was in the office on 06/26/17.  Probably just needs to be picked up at her pharmacy.

## 2017-06-28 NOTE — ED Notes (Signed)
Bed: [facility]3 Expected date:  Expected time:  Means of arrival:  Comments: 

## 2017-06-28 NOTE — ED Triage Notes (Addendum)
Pt complaint of vaginal pain related to recent diagnosis of herpes with recent postpartum visit. Pt continues to verbalize "went off the handle" and having SI related to difficult prognosis with herpes and hemorrhoids. Pt continues to verbal crack use yesterday; denies pain other than vaginal.

## 2017-06-28 NOTE — Telephone Encounter (Signed)
Spoke with patient via phone.  Patient requests refill on lidocaine jelly.  Also requests pain medication.  States she has vaginal pain and hemorrhoid pain.  Spoke with Noni Saupe about prescription refills.  Anderson Malta asked that I speak with Curt Bears who is in office today.  Curt Bears prescribed percocet and printed prescription.  Prescription pickup form and prescription given to front office staff.  Called patient to come to office to pickup prescription for pain medication.  Told patient she would need to bring picture ID with her and sign for prescription.  Told her I would phone in the refill on lidocaine.  Patient states understanding.

## 2017-06-28 NOTE — ED Provider Notes (Signed)
[facility] DEPT Provider Note   CSN: [id number] Arrival date & time: 06/28/17  1625     History   Chief Complaint Chief Complaint  Patient presents with  . Vaginal Pain  . Suicidal    HPI Latoya [name] is a 26 y.o. female.  The history is provided by the patient and a parent.  Mental Health Problem  Presenting symptoms: depression and suicidal thoughts ("driving off a cliff")   Patient accompanied by:  Family member Degree of incapacity (severity):  Moderate Onset quality:  Gradual Duration: 1 yr. Timing:  Constant Progression:  Waxing and waning Context: drug abuse (was introduced to crack by her childrens father. Quit  for several months, but relapsed yesterday), noncompliance (should be taking Zoloft and Paxil ) and stressful life event (being a single mother. Just had a DNA test to proove the female is the father.  Also just found out she likely has HSV)   Treatment compliance:  Some of the time Associated symptoms: anhedonia, feelings of worthlessness, hypersomnia and poor judgment   Risk factors: hx of suicide attempts (Nov via overdose)    Vaginal pain is due to HSV lesions. Started treatment yesterday.  Past Medical History:  Diagnosis Date  . Anemia   . Anxiety   . Depression   . Gestational diabetes   . History of substance abuse   . Pregnancy induced hypertension     Patient Active Problem List   Diagnosis Date Noted  . SVD (spontaneous vaginal delivery) 05/15/2017  . Crack cocaine use 05/15/2017  . Cervical dysplasia 01/16/2017  . Obesity (BMI 30.0-34.9) 01/12/2017  . History of substance abuse 01/12/2017  . History of gestational diabetes mellitus (GDM) 01/12/2017  . History of gestational hypertension 01/12/2017  . History of macrosomia in infant in prior pregnancy, currently pregnant 01/12/2017  . Short interval between pregnancies affecting pregnancy in second trimester, antepartum 01/12/2017  . Late prenatal care 01/12/2017  . Paxil use  in early pregnancy 01/12/2017  . History of suicide attempt 01/12/2017  . Anxiety and depression 01/12/2017  . Supervision of high risk pregnancy, antepartum 01/10/2017  . GDM (gestational diabetes mellitus) 02/25/2016  . Obesity in pregnancy 02/25/2016    Past Surgical History:  Procedure Laterality Date  . DEEP NECK LYMPH NODE BIOPSY / EXCISION  2011    OB History    Gravida Para Term Preterm AB Living   2 2 2  0 0 2   SAB TAB Ectopic Multiple Live Births   0 0 0 0 2       Home Medications    Prior to Admission medications   Medication Sig Start Date End Date Taking? Authorizing Provider  clobetasol cream (TEMOVATE) 5.63 % Apply 1 application topically 2 (two) times daily. 06/22/17  Yes Starr Lake, CNM  dicyclomine (BENTYL) 20 MG tablet Take 1 tablet (20 mg total) by mouth 2 (two) times daily. 06/24/17  Yes Nanavati, Ankit, MD  diphenhydramine-acetaminophen (TYLENOL PM) 25-500 MG TABS tablet Take 1 tablet by mouth at bedtime as needed (sleep and pain).    Yes [provider]  hydrocortisone (ANUSOL-HC) 25 MG suppository Place 1 suppository (25 mg total) rectally 2 (two) times daily. 06/17/17  Yes Marcille Buffy D, CNM  lidocaine (XYLOCAINE) 2 % jelly Apply 1 application topically as needed. 06/28/17  Yes Rasch, Anderson Malta I, NP  loperamide (IMODIUM) 2 MG capsule Take 2 capsules (4 mg total) by mouth at bedtime. 06/24/17  Yes Varney Biles, MD  naproxen (NAPROSYN) 500  MG tablet Take 1 tablet (500 mg total) by mouth 2 (two) times daily. 06/17/17  Yes Marcille Buffy D, CNM  nitroGLYCERIN (NITROGLYN) 2 % ointment Apply 0.5 inches topically 2 (two) times daily. 06/26/17  Yes Rasch, Artist Pais, NP  ondansetron (ZOFRAN ODT) 4 MG disintegrating tablet Take 1 tablet (4 mg total) by mouth every 8 (eight) hours as needed for nausea or vomiting. 06/24/17  Yes Nanavati, Ankit, MD  oxyCODONE-acetaminophen (PERCOCET) 5-325 MG tablet Take 1 tablet by mouth every 6 (six) hours as  needed for severe pain. 06/28/17  Yes Starr Lake, CNM  PARoxetine (PAXIL-CR) 25 MG 24 hr tablet Take 1 tablet (25 mg total) by mouth daily. 05/16/17  Yes Dove, Myra C, MD  sertraline (ZOLOFT) 50 MG tablet Take 50 mg by mouth 2 (two) times daily. 06/15/17  Yes [provider]  valACYclovir (VALTREX) 1000 MG tablet Take 1 tablet (1,000 mg total) by mouth 2 (two) times daily. 06/26/17 07/06/17 Yes Rasch, Anderson Malta I, NP  potassium chloride SA (K-DUR,KLOR-CON) 20 MEQ tablet Take 2 tablets (40 mEq total) by mouth 2 (two) times daily. 06/22/17 06/25/17  Starr Lake, CNM  traMADol (ULTRAM) 50 MG tablet Take 1 tablet (50 mg total) by mouth every 6 (six) hours as needed. Patient not taking: Reported on 06/28/2017 06/17/17   Tresea Mall, CNM    Family History Family History  Problem Relation Age of Onset  . Diabetes Mother   . Hypertension Mother   . Hypertension Father   . Stroke Maternal Grandfather     Social History Social History  Substance Use Topics  . Smoking status: Never Smoker  . Smokeless tobacco: Never Used  . Alcohol use No     Comment: "not for a while"     Allergies   Patient has no known allergies.   Review of Systems Review of Systems  Psychiatric/Behavioral: Positive for suicidal ideas ("driving off a cliff").  All other systems are reviewed and are negative for acute change except as noted in the HPI    Physical Exam Updated Vital Signs BP (!) 126/91 (BP Location: Left Arm)   Pulse (!) 142   Temp 98 F (36.7 C) (Oral)   Resp 18   Ht 5' 9"  (1.753 m)   Wt 83.5 kg (184 lb)   LMP  (LMP Unknown)   SpO2 99%   BMI 27.17 kg/m   Physical Exam  Constitutional: She is oriented to person, place, and time. She appears well-developed and well-nourished. No distress.  HENT:  Head: Normocephalic and atraumatic.  Nose: Nose normal.  Eyes: Pupils are equal, round, and reactive to light. Conjunctivae and EOM are normal. Right eye  exhibits no discharge. Left eye exhibits no discharge. No scleral icterus.  Neck: Normal range of motion. Neck supple.  Cardiovascular: Normal rate and regular rhythm.  Exam reveals no gallop and no friction rub.   No murmur heard. Pulmonary/Chest: Effort normal and breath sounds normal. No stridor. No respiratory distress. She has no rales.  Abdominal: Soft. She exhibits no distension. There is no tenderness.  Musculoskeletal: She exhibits no edema or tenderness.  Neurological: She is alert and oriented to person, place, and time.  Skin: Skin is warm and dry. No rash noted. She is not diaphoretic. No erythema.  Psychiatric: Her speech is normal. Her affect is labile. She expresses no homicidal and no suicidal ideation.  Vitals reviewed.    ED Treatments / Results  Labs (all labs ordered are listed,  but only abnormal results are displayed) Labs Reviewed  COMPREHENSIVE METABOLIC PANEL - Abnormal; Notable for the following:       Result Value   Potassium 3.0 (*)    Glucose, Bld 121 (*)    Calcium 8.7 (*)    Albumin 2.7 (*)    AST 14 (*)    All other components within normal limits  CBC - Abnormal; Notable for the following:    WBC 11.8 (*)    Hemoglobin 10.0 (*)    HCT 32.3 (*)    MCH 24.5 (*)    Platelets 611 (*)    All other components within normal limits  RAPID URINE DRUG SCREEN, HOSP PERFORMED - Abnormal; Notable for the following:    Opiates POSITIVE (*)    Cocaine POSITIVE (*)    All other components within normal limits  ETHANOL  SALICYLATE LEVEL  ACETAMINOPHEN LEVEL  I-STAT BETA HCG BLOOD, ED ([facility], [facility], [facility] ONLY)  I-STAT TROPONIN, ED    EKG  EKG Interpretation  Date/Time:  Thursday June 28 2017 16:51:05 EDT Ventricular Rate:  143 PR Interval:    QRS Duration: 118 QT Interval:  389 QTC Calculation: 601 R Axis:   83 Text Interpretation:  Sinus tachycardia Nonspecific intraventricular conduction delay Since last tracing rate faster st changes likely rate  related Otherwise no significant change Confirmed by Deno Etienne [phone]) on 06/28/2017 4:57:20 PM       Radiology No results found.  Procedures Procedures (including critical care time)  Medications Ordered in ED Medications  lidocaine (XYLOCAINE) 5 % ointment ( Topical Given 06/28/17 1849)  acetaminophen (TYLENOL) tablet 1,000 mg (not administered)  dicyclomine (BENTYL) tablet 20 mg (not administered)  diphenhydramine-acetaminophen (TYLENOL PM) 25-500 MG per tablet 1 tablet (not administered)  hydrocortisone (ANUSOL-HC) suppository 25 mg (not administered)  naproxen (NAPROSYN) tablet 500 mg (not administered)  PARoxetine (PAXIL-CR) 24 hr tablet 25 mg (not administered)  sertraline (ZOLOFT) tablet 50 mg (not administered)  valACYclovir (VALTREX) tablet 1,000 mg (not administered)  diphenhydrAMINE (BENADRYL) capsule 25 mg (25 mg Oral Given 06/28/17 1810)  potassium chloride SA (K-DUR,KLOR-CON) CR tablet 40 mEq (40 mEq Oral Given 06/28/17 2017)     Initial Impression / Assessment and Plan / ED Course  I have reviewed the triage vital signs and the nursing notes.  Pertinent labs & imaging results that were available during my care of the patient were reviewed by me and considered in my medical decision making (see chart for details).     Crack cocaine use with tachycardia. No chest or SOB. We'll monitor until tachycardia resolves.  Depression. Signs of major depressive disorder with suicidal ideations. Once medically cleared will have patient evaluated by behavioral health.  Screening labs obtained were grossly reassuring other than mild hypokalemia for which patient was repleted orally.  After monitoring for several hours tachycardia significantly improved. She is medically cleared for behavioral health evaluation and management.  Genital herpes. We'll continue context and provide lidocaine cream.  Final Clinical Impressions(s) / ED Diagnoses   Final diagnoses:  Postpartum  depression  Suicidal ideation     Cardama, Grayce Sessions, MD 06/28/17 2243

## 2017-06-29 ENCOUNTER — Inpatient Hospital Stay (HOSPITAL_COMMUNITY): Admission: AD | Admit: 2017-06-29 | Payer: Medicaid Other | Source: Intra-hospital | Admitting: Psychiatry

## 2017-06-29 ENCOUNTER — Encounter: Payer: Self-pay | Admitting: *Deleted

## 2017-06-29 ENCOUNTER — Other Ambulatory Visit: Payer: Self-pay | Admitting: *Deleted

## 2017-06-29 DIAGNOSIS — F1410 Cocaine abuse, uncomplicated: Secondary | ICD-10-CM | POA: Diagnosis present

## 2017-06-29 DIAGNOSIS — F332 Major depressive disorder, recurrent severe without psychotic features: Secondary | ICD-10-CM | POA: Diagnosis present

## 2017-06-29 MED ORDER — FLUCONAZOLE 150 MG PO TABS: 150.0000 mg | ORAL_TABLET | Freq: Once | ORAL | 0 refills | Status: AC

## 2017-06-29 MED ORDER — OXYCODONE-ACETAMINOPHEN 5-325 MG PO TABS
2.0000 | ORAL_TABLET | Freq: Once | ORAL | Status: AC
  Administered 2017-06-29: 2 via ORAL
  Filled 2017-06-29: qty 2

## 2017-06-29 MED ORDER — FLUCONAZOLE 150 MG PO TABS
150.0000 mg | ORAL_TABLET | Freq: Once | ORAL | Status: AC
  Administered 2017-06-29: 150 mg via ORAL
  Filled 2017-06-29: qty 1

## 2017-06-29 NOTE — ED Notes (Signed)
Pt has been in extreme discomfort today because of genital & oral blisters and hemorrhoids. She is not comfortable wearing her clothes because she feels hot and throws them in the floor. She is constantly requesting cold wash clothes to put on her sore rectum and leaves them scattered around the bed. This causes staff to have to attend to cleanliness for her. Pt has cried and cried because of her discomfort and just does not move to get dressed and get ready to transport.

## 2017-06-29 NOTE — Progress Notes (Signed)
06/29/17 1338:  LRT was informed by staff to let pt stay in room due to medical concerns.   Victorino Sparrow, LRT/CTRS

## 2017-06-29 NOTE — ED Notes (Signed)
Return call from [place] with infection prevention: Pt will be on standard infection precautions for herpes infection.

## 2017-06-29 NOTE — ED Notes (Signed)
Called [place] with infection prevention to report that pt has blisters on the inside and outside of her mouth. She is in extreme pain with the genital and oral blisters.

## 2017-06-29 NOTE — ED Notes (Signed)
SBAR Report received from previous nurse. Pt received calm and visible on unit. Pt denies current SI/ HI, A/V H, depression, anxiety at this time, and appears otherwise stable and free of distress. Pt reports irritation in her peri area of 10/10. Pt reminded of camera surveillance, q 15 min rounds, and rules of the milieu. Will continue to assess.
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
## 2017-06-29 NOTE — ED Notes (Signed)
Pt is afraid that when she goes to [facility] her pain will not be managed. She continues to stay in bed without clothes, wrapped in a blanket. Her pain is now under control.

## 2017-06-29 NOTE — BH Assessment (Signed)
[place] Assessment Progress Note  Per Corena Pilgrim, MD, this pt requires psychiatric hospitalization at this time.  Letitia Libra, RN, [facility] has assigned pt to [facility] Rm 305-2.  Pt has signed Voluntary Admission and Consent for Treatment, as well as Consent to Release Information to no one, and signed forms have been faxed to [facility].  Pt's nurse, Diane, has been notified, and agrees to send original paperwork along with pt via [facility], and to call report to [phone].  Jalene Mullet, [place] Triage Specialist [phone]

## 2017-06-30 ENCOUNTER — Inpatient Hospital Stay (EMERGENCY_DEPARTMENT_HOSPITAL)
Admission: AD | Admit: 2017-06-30 | Discharge: 2017-06-30 | Disposition: A | Discharge status: Home / Self Care | Payer: Medicaid Other | Source: Ambulatory Visit | Attending: Obstetrics & Gynecology | Admitting: Obstetrics & Gynecology

## 2017-06-30 ENCOUNTER — Encounter (HOSPITAL_COMMUNITY): Payer: Self-pay

## 2017-06-30 DIAGNOSIS — F1410 Cocaine abuse, uncomplicated: Secondary | ICD-10-CM | POA: Diagnosis not present

## 2017-06-30 DIAGNOSIS — F1910 Other psychoactive substance abuse, uncomplicated: Secondary | ICD-10-CM | POA: Diagnosis not present

## 2017-06-30 DIAGNOSIS — F419 Anxiety disorder, unspecified: Secondary | ICD-10-CM

## 2017-06-30 DIAGNOSIS — R45851 Suicidal ideations: Secondary | ICD-10-CM | POA: Diagnosis not present

## 2017-06-30 DIAGNOSIS — R4587 Impulsiveness: Secondary | ICD-10-CM

## 2017-06-30 DIAGNOSIS — R450 Nervousness: Secondary | ICD-10-CM | POA: Diagnosis not present

## 2017-06-30 DIAGNOSIS — F332 Major depressive disorder, recurrent severe without psychotic features: Secondary | ICD-10-CM

## 2017-06-30 DIAGNOSIS — B009 Herpesviral infection, unspecified: Secondary | ICD-10-CM

## 2017-06-30 HISTORY — DX: Herpesviral infection, unspecified: B00.9

## 2017-06-30 MED ORDER — DULOXETINE HCL 30 MG PO CPEP: 30.0000 mg | ORAL_CAPSULE | Freq: Every day | ORAL | 0 refills | Status: DC

## 2017-06-30 MED ORDER — DULOXETINE HCL 30 MG PO CPEP
30.0000 mg | ORAL_CAPSULE | Freq: Every day | ORAL | Status: DC
  Administered 2017-06-30: 30 mg via ORAL
  Filled 2017-06-30: qty 1

## 2017-06-30 MED ORDER — OXYCODONE-ACETAMINOPHEN 5-325 MG PO TABS
1.0000 | ORAL_TABLET | Freq: Once | ORAL | Status: AC
  Administered 2017-06-30: 1 via ORAL
  Filled 2017-06-30: qty 1

## 2017-06-30 MED ORDER — KETOROLAC TROMETHAMINE 60 MG/2ML IM SOLN
60.0000 mg | Freq: Once | INTRAMUSCULAR | Status: AC
  Administered 2017-06-30: 60 mg via INTRAMUSCULAR
  Filled 2017-06-30: qty 2

## 2017-06-30 MED ORDER — KETOROLAC TROMETHAMINE 10 MG PO TABS: 10.0000 mg | ORAL_TABLET | Freq: Four times a day (QID) | ORAL | 0 refills | Status: DC | PRN

## 2017-06-30 MED ORDER — GABAPENTIN 300 MG PO CAPS: 300.0000 mg | ORAL_CAPSULE | Freq: Three times a day (TID) | ORAL | 0 refills | Status: DC

## 2017-06-30 MED ORDER — GABAPENTIN 300 MG PO CAPS
300.0000 mg | ORAL_CAPSULE | Freq: Three times a day (TID) | ORAL | Status: DC
  Administered 2017-06-30 (×2): 300 mg via ORAL
  Filled 2017-06-30 (×2): qty 1

## 2017-06-30 MED ORDER — OXYCODONE-ACETAMINOPHEN 5-325 MG PO TABS: 1.0000 | ORAL_TABLET | ORAL | 0 refills | Status: DC | PRN

## 2017-06-30 NOTE — MAU Provider Note (Signed)
History     CSN: [id number]  Arrival date and time: 06/30/17 1734   First Provider Initiated Contact with Patient 06/30/17 1817      Chief Complaint  Patient presents with  . Abdominal Pain  . Vaginal Pain   HPI Latoya [name] 26 y.o. Comes to MAU today with pain from vaginal and vulvar HSV outbreak and severe hemorrhoids.  Earlier today she was an inpatient at [facility] and was requesting percocet for pain control.  It is their policy that narcotics are not given and she was given the option to be discharged.  Client had spent time in [place] prior to her admission to [facility] and was taking 2 percocet for pain while there.  She said the narcotics were helping her pain be more manageable.  Now she is tearful and barely able to move.  She has taken her other medications today and is still on her valtrex.  Is on PO toradol and took one pill this morning - she takes it twice a day.  Her history includes being seen for her postpartum exam on 06-26-17.  She was wanting an IUD for contraception but on exam she has blisters on her vulva and ulcers in the vagina.  She was informed about the HSV outbreak and she became distraught and was transported to [facility] for evaluation.  OB History    Gravida Para Term Preterm AB Living   2 2 2  0 0 2   SAB TAB Ectopic Multiple Live Births   0 0 0 0 2      Past Medical History:  Diagnosis Date  . Anemia   . Anxiety   . Depression   . Gestational diabetes   . History of substance abuse   . HSV infection   . Pregnancy induced hypertension     Past Surgical History:  Procedure Laterality Date  . DEEP NECK LYMPH NODE BIOPSY / EXCISION  2011    Family History  Problem Relation Age of Onset  . Diabetes Mother   . Hypertension Mother   . Hypertension Father   . Stroke Maternal Grandfather     Social History  Substance Use Topics  . Smoking status: Never Smoker  . Smokeless tobacco: Never Used  . Alcohol use No     Comment: "not for a  while"    Allergies: No Known Allergies  Prescriptions Prior to Admission  Medication Sig Dispense Refill Last Dose  . clobetasol cream (TEMOVATE) 7.40 % Apply 1 application topically 2 (two) times daily. 30 g 0 06/28/2017 at Unknown time  . dicyclomine (BENTYL) 20 MG tablet Take 1 tablet (20 mg total) by mouth 2 (two) times daily. 20 tablet 0 06/27/2017 at Unknown time  . diphenhydramine-acetaminophen (TYLENOL PM) 25-500 MG TABS tablet Take 1 tablet by mouth at bedtime as needed (sleep and pain).    Past Week at Unknown time  . [START ON 07/01/2017] DULoxetine (CYMBALTA) 30 MG capsule Take 1 capsule (30 mg total) by mouth daily. 30 capsule 0   . gabapentin (NEURONTIN) 300 MG capsule Take 1 capsule (300 mg total) by mouth 3 (three) times daily. 90 capsule 0   . hydrocortisone (ANUSOL-HC) 25 MG suppository Place 1 suppository (25 mg total) rectally 2 (two) times daily. 12 suppository 1 Past Month at Unknown time  . lidocaine (XYLOCAINE) 2 % jelly Apply 1 application topically as needed. 30 mL 2 06/28/2017 at Unknown time  . loperamide (IMODIUM) 2 MG capsule Take 2 capsules (  4 mg total) by mouth at bedtime. 12 capsule 0 Past Week at Unknown time  . naproxen (NAPROSYN) 500 MG tablet Take 1 tablet (500 mg total) by mouth 2 (two) times daily. 30 tablet 0 06/28/2017 at Unknown time  . nitroGLYCERIN (NITROGLYN) 2 % ointment Apply 0.5 inches topically 2 (two) times daily. 30 g 0 Past Week at Unknown time  . ondansetron (ZOFRAN ODT) 4 MG disintegrating tablet Take 1 tablet (4 mg total) by mouth every 8 (eight) hours as needed for nausea or vomiting. 20 tablet 0 Past Week at Unknown time  . PARoxetine (PAXIL-CR) 25 MG 24 hr tablet Take 1 tablet (25 mg total) by mouth daily. 31 tablet 12 Past Week at Unknown time  . potassium chloride SA (K-DUR,KLOR-CON) 20 MEQ tablet Take 2 tablets (40 mEq total) by mouth 2 (two) times daily. 12 tablet 0 06/25/2017  . sertraline (ZOLOFT) 50 MG tablet Take 50 mg by mouth 2 (two)  times daily.  0 Past Week at Unknown time  . traMADol (ULTRAM) 50 MG tablet Take 1 tablet (50 mg total) by mouth every 6 (six) hours as needed. (Patient not taking: Reported on 06/28/2017) 15 tablet 0 Completed Course at Unknown time  . valACYclovir (VALTREX) 1000 MG tablet Take 1 tablet (1,000 mg total) by mouth 2 (two) times daily. 20 tablet 0 06/28/2017 at Unknown time    Review of Systems  Constitutional: Negative for fever.  Gastrointestinal: Positive for rectal pain. Negative for nausea and vomiting.  Genitourinary: Positive for vaginal pain.       Vulvar pain Hemorrhoids with pain Liquid stools   Physical Exam   Blood pressure 111/67, pulse (!) 115, temperature 97.8 F (36.6 C), temperature source Oral, resp. rate 20, unknown if currently breastfeeding.  Physical Exam  Nursing note and vitals reviewed. Constitutional: She is oriented to person, place, and time. She appears well-developed and well-nourished.  Tearful due to pain  HENT:  Head: Normocephalic.  Eyes: EOM are normal.  Neck: Neck supple.  GI: Soft. There is no tenderness. There is no rebound and no guarding.  Genitourinary:  Genitourinary Comments: Visualized perineum.  Labia majora covered with red irritation extending to between buttocks and into groin bilaterally.  Entire area is wet and some liquid oozing seen.  Small amount of stool noted at area where buttocks touch.  Ring of hemorrhoids slightly edematous but non-thrombosed noted at rectum.  Musculoskeletal: Normal range of motion.  Neurological: She is alert and oriented to person, place, and time.  Skin: Skin is warm and dry.  Psychiatric: She has a normal mood and affect.    MAU Course  Procedures  MDM Consult with Dr. Harolyn Rutherford and developed a plan of care which incudes one prescription of percocet.  Discussed with client to take one tablet every 4 hours as needed.  Given Percocet one tablet and Toradol 60 mg IM in MAU. Reviewed with client how to help  this outbreak to heal by keeping it dry and open to the air. Reviewed to take percocet as instructed - one every 4 hours as needed.  Assessment and Plan  HSV outbreak Hemorrhoids  Plan o not take Ultram and Percocet together You can take Toradol or Ibuophen or Naproxen but do not take them together.  And you can take one of them with the percocet. Keep your perineum as dry as possible to help with the healing. Follow up with your mental health provider as soon as the HSV outbreak has healed. Be  seen for your IUD as soon as the HSV outbreak has healed. It is very important to continue to valtrex and to get a prescription for suppression after this outbreak is over.   Thaer Miyoshi L Leemon Ayala 06/30/2017, 7:05 PM

## 2017-06-30 NOTE — BHH Suicide Risk Assessment (Signed)
Suicide Risk Assessment  Discharge Assessment   [facility] Discharge Suicide Risk Assessment   Principal Problem: Major depressive disorder, recurrent severe without psychotic features [facility]) Discharge Diagnoses:  Patient Active Problem List   Diagnosis Date Noted  . Major depressive disorder, recurrent severe without psychotic features ([place]) [F33.2] 06/29/2017  . Cocaine abuse ([place]) [F14.10] 06/29/2017  . SVD (spontaneous vaginal delivery) [O80] 05/15/2017  . Crack cocaine use [F14.90] 05/15/2017  . Cervical dysplasia [N87.9] 01/16/2017  . Obesity (BMI 30.0-34.9) [E66.9] 01/12/2017  . History of substance abuse [Z87.898] 01/12/2017  . History of gestational diabetes mellitus (GDM) [Z86.32] 01/12/2017  . History of gestational hypertension [Z87.59] 01/12/2017  . History of macrosomia in infant in prior pregnancy, currently pregnant [O09.299] 01/12/2017  . Short interval between pregnancies affecting pregnancy in second trimester, antepartum [O09.892] 01/12/2017  . Late prenatal care [O09.30] 01/12/2017  . Paxil use in early pregnancy [Z79.899] 01/12/2017  . History of suicide attempt [Z91.5] 01/12/2017  . Anxiety and depression [F41.9, F32.9] 01/12/2017  . Supervision of high risk pregnancy, antepartum [O09.90] 01/10/2017  . GDM (gestational diabetes mellitus) [O24.419] 02/25/2016  . Obesity in pregnancy [O99.210] 02/25/2016    Total Time spent with patient: 45 minutes  Musculoskeletal: Strength & Muscle Tone: within normal limits Gait & Station: normal Patient leans: N/A  Psychiatric Specialty Exam:   Blood pressure 136/74, pulse 84, temperature 99 F (37.2 C), temperature source Oral, resp. rate 18, height 5' 9"  (1.753 m), weight 83.5 kg (184 lb), SpO2 99 %, unknown if currently breastfeeding.Body mass index is 27.17 kg/m.  General Appearance: Casual  Eye Contact::  Good  Speech:  Clear and Coherent409  Volume:  Normal  Mood:  Angry and Irritable  Affect:  Congruent and  Tearful  Thought Process:  Coherent, Goal Directed and Linear  Orientation:  Full (Time, Place, and Person)  Thought Content:  Logical  Suicidal Thoughts:  No  Homicidal Thoughts:  No  Memory:  Immediate;   Good Recent;   Good Remote;   Fair  Judgement:  Fair  Insight:  Fair  Psychomotor Activity:  Normal  Concentration:  Good  Recall:  Good  Fund of Knowledge:Good  Language: Good  Akathisia:  No  Handed:  Right  AIMS (if indicated):     Assets:  Agricultural consultant Housing Physical Health Resilience Social Support  Sleep:     Cognition: WNL  ADL's:  Intact   Mental Status Per Nursing Assessment::   On Admission:     Demographic Factors:  Adolescent or young adult  Loss Factors: Financial problems/change in socioeconomic status  Historical Factors: Impulsivity  Risk Reduction Factors:   Responsible for children under 42 years of age, Sense of responsibility to family and Living with another person, especially a relative  Continued Clinical Symptoms:  Depression:   Impulsivity Alcohol/Substance Abuse/Dependencies More than one psychiatric diagnosis  Cognitive Features That Contribute To Risk:  Closed-mindedness    Suicide Risk:  Minimal: No identifiable suicidal ideation.  Patients presenting with no risk factors but with morbid ruminations; may be classified as minimal risk based on the severity of the depressive symptoms    Plan Of Care/Follow-up recommendations:  Activity:  as tolerated Diet:  Heart Healthy  Ethelene Hal, NP 06/30/2017, 4:45 PM

## 2017-06-30 NOTE — ED Notes (Signed)
Pt is on the phone and wearing pants.

## 2017-06-30 NOTE — Patient Outreach (Signed)
ED Peer Support Specialist Patient Intake (Complete at intake & 30-60 Day Follow-up)  Name: Latoya [name]  MRN: [id number]  Age: 26 y.o.   Date of Admission: 06/30/2017  Intake: Initial Comments:      Primary Reason Admitted: Major Depressive Disorder, Recurrent, Severe without Psychotic Features. Cocaine Use Disorder, Severe.  Lab values: Alcohol/ETOH: Positive Positive UDS? Yes Amphetamines: No Barbiturates: No Benzodiazepines: No Cocaine: Yes Opiates: Yes Cannabinoids: No  Demographic information: Gender: Female Ethnicity: African American Marital Status:   Insurance Status:   Ecologist (Work Neurosurgeon, Physicist, medical, etc.:   Lives with:   Living situation:    Reported Patient History: Patient reported health conditions:   Patient aware of HIV and hepatitis status:    In past year, has patient visited ED for any reason?    Number of ED visits:    Reason(s) for visit:    In past year, has patient been hospitalized for any reason?    Number of hospitalizations:    Reason(s) for hospitalization:    In past year, has patient been arrested?    Number of arrests:    Reason(s) for arrest:    In past year, has patient been incarcerated?    Number of incarcerations:    Reason(s) for incarceration:    In past year, has patient received medication-assisted treatment?    In past year, patient received the following treatments:    In past year, has patient received any harm reduction services?    Did this include any of the following?    In past year, has patient received care from a mental health provider for diagnosis other than SUD?    In past year, is this first time patient has overdosed?    Number of past overdoses:    In past year, is this first time patient has been hospitalized for an overdose?    Number of hospitalizations for overdose(s):    Is patient currently receiving treatment for a mental health diagnosis?     Patient reports experiencing difficulty participating in SUD treatment:      Most important reason(s) for this difficulty?    Has patient received prior services for treatment?    In past, patient has received services from following agencies:    Plan of Care:  Suggested follow up at these agencies/treatment centers:  (Patient does not want substance use services or support group information for substance use. )  Other information: Patient was not interested in help with any substance use treatment services/support group information.    Mason Jim, CPSS  06/30/2017 4:40 PM

## 2017-06-30 NOTE — ED Notes (Signed)
Refused vitals Rn made aware

## 2017-06-30 NOTE — Progress Notes (Signed)
Patient ID: Latoya [name], female   DOB: [birth year], 26 y.o.   MRN: [id number] Pt requesting to leave [facility]. Pt is requesting narcotics for her pain. This Probation officer explained that narcotics are not given in the [facility] and she is here voluntarily and we can not hold her against her will. Pt stated she wanted her clothes and to leave.   Discussed with treatment team who recommend that Pt be discharged.    Ethelene Hal, FNP-C 06/30/2017    1644

## 2017-06-30 NOTE — ED Notes (Signed)
Went to pt's room with discharge paperwork and asked if she had called a ride. Pt said "it's not fair" that she was not being prescribed pain meds and said she did not want to be discharged. Explained to pt that she would need to put on her shirt and leave. She would not. One security guard and two police officers arrived at room and spoke with pt about the need to leave. Pt was escorted off unit by security and [employer], who took belongings and script for Cymbalta and Gabapentin. Pt was ambulatory and in no acute distress.
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
## 2017-06-30 NOTE — ED Notes (Signed)
Pt declined scheduled sertraline, which she said was causing her nausea, and her suppository, which she said was because her hemorrhoid was "too hard." This Probation officer offered to assist but pt came tearful and refused. Told pt that suppository would help her problem and offered to assist, but she declined. Told her to let staff know if she reconsidered. Urged pt to wash hands frequently and keep hands away from affected areas. Pt remains safe with checks, rounds, and camera monitoring.

## 2017-06-30 NOTE — MAU Note (Signed)
States she founds out she has HSV on 10/02 and she is in lots of pain. Hemorhoids are also painful. Also reports abdominal cramps.

## 2017-06-30 NOTE — Discharge Instructions (Signed)
Do not take Ultram and Percocet together You can take Toradol or Ibuophen or Naproxen but do not take them together.  And you can take one of them with the percocet. Keep your perineum as dry as possible to help with the healing. Follow up with your mental health provider as soon as the HSV outbreak has healed. Be seen for your IUD as soon as the HSV outbreak has healed. It is very important to continue to valtrex.

## 2017-06-30 NOTE — ED Notes (Signed)
Pt was angry that she would not be prescribed opiate medication and requested to leave if she was not going to be prescribed such meds. This was after pt was given scheduled Gabapentin and offered PRN Tylenol 1000 mg. Pt declined Tylenol. NP made aware of pt's request to leave and spoke with pt. Pt to be discharged. Pt refused vitals. "Don't touch me."

## 2017-07-04 ENCOUNTER — Emergency Department (HOSPITAL_COMMUNITY): Payer: Medicaid Other

## 2017-07-04 ENCOUNTER — Inpatient Hospital Stay (HOSPITAL_COMMUNITY)
Admission: EM | Admit: 2017-07-04 | Discharge: 2017-07-13 | DRG: 776 | Discharge status: Against Medical Advice | Payer: Medicaid Other | Attending: Family Medicine | Admitting: Family Medicine

## 2017-07-04 ENCOUNTER — Inpatient Hospital Stay (HOSPITAL_COMMUNITY)
Admission: AD | Admit: 2017-07-04 | Discharge: 2017-07-04 | Disposition: A | Discharge status: Home / Self Care | Payer: Medicaid Other | Source: Ambulatory Visit | Attending: Family Medicine | Admitting: Family Medicine

## 2017-07-04 ENCOUNTER — Encounter (HOSPITAL_COMMUNITY): Payer: Self-pay

## 2017-07-04 ENCOUNTER — Encounter (HOSPITAL_COMMUNITY): Payer: Self-pay | Admitting: Emergency Medicine

## 2017-07-04 DIAGNOSIS — R12 Heartburn: Secondary | ICD-10-CM | POA: Diagnosis not present

## 2017-07-04 DIAGNOSIS — Z791 Long term (current) use of non-steroidal anti-inflammatories (NSAID): Secondary | ICD-10-CM

## 2017-07-04 DIAGNOSIS — R109 Unspecified abdominal pain: Secondary | ICD-10-CM

## 2017-07-04 DIAGNOSIS — K633 Ulcer of intestine: Secondary | ICD-10-CM | POA: Diagnosis present

## 2017-07-04 DIAGNOSIS — F419 Anxiety disorder, unspecified: Secondary | ICD-10-CM

## 2017-07-04 DIAGNOSIS — Z79899 Other long term (current) drug therapy: Secondary | ICD-10-CM

## 2017-07-04 DIAGNOSIS — Z6827 Body mass index (BMI) 27.0-27.9, adult: Secondary | ICD-10-CM

## 2017-07-04 DIAGNOSIS — K529 Noninfective gastroenteritis and colitis, unspecified: Secondary | ICD-10-CM

## 2017-07-04 DIAGNOSIS — N390 Urinary tract infection, site not specified: Secondary | ICD-10-CM | POA: Diagnosis present

## 2017-07-04 DIAGNOSIS — D509 Iron deficiency anemia, unspecified: Secondary | ICD-10-CM | POA: Diagnosis present

## 2017-07-04 DIAGNOSIS — K8020 Calculus of gallbladder without cholecystitis without obstruction: Secondary | ICD-10-CM | POA: Diagnosis present

## 2017-07-04 DIAGNOSIS — R197 Diarrhea, unspecified: Secondary | ICD-10-CM

## 2017-07-04 DIAGNOSIS — F53 Postpartum depression: Secondary | ICD-10-CM | POA: Diagnosis present

## 2017-07-04 DIAGNOSIS — F1910 Other psychoactive substance abuse, uncomplicated: Secondary | ICD-10-CM | POA: Diagnosis not present

## 2017-07-04 DIAGNOSIS — R0789 Other chest pain: Secondary | ICD-10-CM | POA: Diagnosis present

## 2017-07-04 DIAGNOSIS — D649 Anemia, unspecified: Secondary | ICD-10-CM

## 2017-07-04 DIAGNOSIS — F1410 Cocaine abuse, uncomplicated: Secondary | ICD-10-CM | POA: Diagnosis present

## 2017-07-04 DIAGNOSIS — E0590 Thyrotoxicosis, unspecified without thyrotoxic crisis or storm: Secondary | ICD-10-CM | POA: Diagnosis present

## 2017-07-04 DIAGNOSIS — Z8249 Family history of ischemic heart disease and other diseases of the circulatory system: Secondary | ICD-10-CM

## 2017-07-04 DIAGNOSIS — Z9889 Other specified postprocedural states: Secondary | ICD-10-CM | POA: Insufficient documentation

## 2017-07-04 DIAGNOSIS — F332 Major depressive disorder, recurrent severe without psychotic features: Secondary | ICD-10-CM | POA: Diagnosis present

## 2017-07-04 DIAGNOSIS — D500 Iron deficiency anemia secondary to blood loss (chronic): Secondary | ICD-10-CM

## 2017-07-04 DIAGNOSIS — K644 Residual hemorrhoidal skin tags: Secondary | ICD-10-CM | POA: Diagnosis present

## 2017-07-04 DIAGNOSIS — R000 Tachycardia, unspecified: Secondary | ICD-10-CM | POA: Diagnosis present

## 2017-07-04 DIAGNOSIS — O99345 Other mental disorders complicating the puerperium: Secondary | ICD-10-CM

## 2017-07-04 DIAGNOSIS — O9963 Diseases of the digestive system complicating the puerperium: Principal | ICD-10-CM | POA: Diagnosis present

## 2017-07-04 DIAGNOSIS — K859 Acute pancreatitis without necrosis or infection, unspecified: Secondary | ICD-10-CM

## 2017-07-04 DIAGNOSIS — A6000 Herpesviral infection of urogenital system, unspecified: Secondary | ICD-10-CM | POA: Diagnosis present

## 2017-07-04 DIAGNOSIS — Z79891 Long term (current) use of opiate analgesic: Secondary | ICD-10-CM

## 2017-07-04 DIAGNOSIS — E876 Hypokalemia: Secondary | ICD-10-CM | POA: Insufficient documentation

## 2017-07-04 DIAGNOSIS — K851 Biliary acute pancreatitis without necrosis or infection: Secondary | ICD-10-CM | POA: Diagnosis present

## 2017-07-04 DIAGNOSIS — K130 Diseases of lips: Secondary | ICD-10-CM | POA: Diagnosis present

## 2017-07-04 DIAGNOSIS — K523 Indeterminate colitis: Secondary | ICD-10-CM | POA: Diagnosis present

## 2017-07-04 DIAGNOSIS — R112 Nausea with vomiting, unspecified: Secondary | ICD-10-CM

## 2017-07-04 DIAGNOSIS — F1110 Opioid abuse, uncomplicated: Secondary | ICD-10-CM | POA: Diagnosis present

## 2017-07-04 DIAGNOSIS — E440 Moderate protein-calorie malnutrition: Secondary | ICD-10-CM | POA: Diagnosis present

## 2017-07-04 DIAGNOSIS — O99341 Other mental disorders complicating pregnancy, first trimester: Secondary | ICD-10-CM | POA: Insufficient documentation

## 2017-07-04 DIAGNOSIS — O26891 Other specified pregnancy related conditions, first trimester: Secondary | ICD-10-CM

## 2017-07-04 DIAGNOSIS — E648 Sequelae of other nutritional deficiencies: Secondary | ICD-10-CM | POA: Diagnosis not present

## 2017-07-04 DIAGNOSIS — Z3A01 Less than 8 weeks gestation of pregnancy: Secondary | ICD-10-CM

## 2017-07-04 DIAGNOSIS — R748 Abnormal levels of other serum enzymes: Secondary | ICD-10-CM | POA: Diagnosis present

## 2017-07-04 DIAGNOSIS — K5100 Ulcerative (chronic) pancolitis without complications: Secondary | ICD-10-CM | POA: Diagnosis present

## 2017-07-04 DIAGNOSIS — E860 Dehydration: Secondary | ICD-10-CM | POA: Diagnosis present

## 2017-07-04 DIAGNOSIS — F329 Major depressive disorder, single episode, unspecified: Secondary | ICD-10-CM

## 2017-07-04 DIAGNOSIS — R7989 Other specified abnormal findings of blood chemistry: Secondary | ICD-10-CM | POA: Diagnosis present

## 2017-07-04 DIAGNOSIS — Z765 Malingerer [conscious simulation]: Secondary | ICD-10-CM

## 2017-07-04 DIAGNOSIS — G8929 Other chronic pain: Secondary | ICD-10-CM | POA: Diagnosis present

## 2017-07-04 DIAGNOSIS — R1084 Generalized abdominal pain: Secondary | ICD-10-CM | POA: Diagnosis present

## 2017-07-04 DIAGNOSIS — E878 Other disorders of electrolyte and fluid balance, not elsewhere classified: Secondary | ICD-10-CM | POA: Diagnosis present

## 2017-07-04 DIAGNOSIS — Z833 Family history of diabetes mellitus: Secondary | ICD-10-CM

## 2017-07-04 DIAGNOSIS — Z915 Personal history of self-harm: Secondary | ICD-10-CM

## 2017-07-04 DIAGNOSIS — R195 Other fecal abnormalities: Secondary | ICD-10-CM

## 2017-07-04 DIAGNOSIS — Z87410 Personal history of cervical dysplasia: Secondary | ICD-10-CM

## 2017-07-04 DIAGNOSIS — R933 Abnormal findings on diagnostic imaging of other parts of digestive tract: Secondary | ICD-10-CM

## 2017-07-04 HISTORY — DX: Malingerer (conscious simulation): Z76.5

## 2017-07-04 LAB — COMPREHENSIVE METABOLIC PANEL
ALT: 25 U/L (ref 14–54)
ALT: 24 U/L (ref 14–54)
AST: 24 U/L (ref 15–41)
AST: 19 U/L (ref 15–41)
Albumin: 2 g/dL — ABNORMAL LOW (ref 3.5–5.0)
Albumin: 1.8 g/dL — ABNORMAL LOW (ref 3.5–5.0)
Alkaline Phosphatase: 141 U/L — ABNORMAL HIGH (ref 38–126)
Alkaline Phosphatase: 128 U/L — ABNORMAL HIGH (ref 38–126)
Anion gap: 17 — ABNORMAL HIGH (ref 5–15)
Anion gap: 14 (ref 5–15)
BUN: 15 mg/dL (ref 6–20)
BUN: 13 mg/dL (ref 6–20)
CO2: 23 mmol/L (ref 22–32)
CO2: 24 mmol/L (ref 22–32)
Calcium: 8.3 mg/dL — ABNORMAL LOW (ref 8.9–10.3)
Calcium: 8.3 mg/dL — ABNORMAL LOW (ref 8.9–10.3)
Chloride: 94 mmol/L — ABNORMAL LOW (ref 101–111)
Chloride: 97 mmol/L — ABNORMAL LOW (ref 101–111)
Creatinine, Ser: 0.85 mg/dL (ref 0.44–1.00)
Creatinine, Ser: 0.89 mg/dL (ref 0.44–1.00)
GFR calc Af Amer: >60 mL/min (ref >60)
GFR calc Af Amer: >60 mL/min (ref >60)
GFR calc non Af Amer: >60 mL/min (ref >60)
GFR calc non Af Amer: >60 mL/min (ref >60)
Glucose, Bld: 108 mg/dL — ABNORMAL HIGH (ref 65–99)
Glucose, Bld: 102 mg/dL — ABNORMAL HIGH (ref 65–99)
Potassium: 3.4 mmol/L — ABNORMAL LOW (ref 3.5–5.1)
Potassium: 3.8 mmol/L (ref 3.5–5.1)
Sodium: 134 mmol/L — ABNORMAL LOW (ref 135–145)
Sodium: 135 mmol/L (ref 135–145)
Total Bilirubin: 0.7 mg/dL (ref 0.3–1.2)
Total Bilirubin: 0.7 mg/dL (ref 0.3–1.2)
Total Protein: 7.4 g/dL (ref 6.5–8.1)
Total Protein: 6.7 g/dL (ref 6.5–8.1)

## 2017-07-04 LAB — IRON AND TIBC
Iron: 7 ug/dL — ABNORMAL LOW (ref 28–170)
Saturation Ratios: 5 % — ABNORMAL LOW (ref 10.4–31.8)
TIBC: 127 ug/dL — ABNORMAL LOW (ref 250–450)
UIBC: 120 ug/dL

## 2017-07-04 LAB — CBC WITH DIFFERENTIAL/PLATELET
Basophils Absolute: 0 10*3/uL (ref 0.0–0.1)
Basophils Absolute: 0 10*3/uL (ref 0.0–0.1)
Basophils Relative: 0 %
Basophils Relative: 0 %
Eosinophils Absolute: 0 10*3/uL (ref 0.0–0.7)
Eosinophils Absolute: 0 10*3/uL (ref 0.0–0.7)
Eosinophils Relative: 0 %
Eosinophils Relative: 0 %
HCT: 29.5 % — ABNORMAL LOW (ref 36.0–46.0)
HCT: 28.2 % — ABNORMAL LOW (ref 36.0–46.0)
Hemoglobin: 9.3 g/dL — ABNORMAL LOW (ref 12.0–15.0)
Hemoglobin: 9 g/dL — ABNORMAL LOW (ref 12.0–15.0)
Lymphocytes Relative: 11 %
Lymphocytes Relative: 16 %
Lymphs Abs: 1.5 10*3/uL (ref 0.7–4.0)
Lymphs Abs: 1.8 10*3/uL (ref 0.7–4.0)
MCH: 24.4 pg — ABNORMAL LOW (ref 26.0–34.0)
MCH: 24.7 pg — ABNORMAL LOW (ref 26.0–34.0)
MCHC: 31.5 g/dL (ref 30.0–36.0)
MCHC: 31.9 g/dL (ref 30.0–36.0)
MCV: 77.4 fL — ABNORMAL LOW (ref 78.0–100.0)
MCV: 77.3 fL — ABNORMAL LOW (ref 78.0–100.0)
Monocytes Absolute: 0.9 10*3/uL (ref 0.1–1.0)
Monocytes Absolute: 1.8 10*3/uL — ABNORMAL HIGH (ref 0.1–1.0)
Monocytes Relative: 7 %
Monocytes Relative: 16 %
Neutro Abs: 10.5 10*3/uL — ABNORMAL HIGH (ref 1.7–7.7)
Neutro Abs: 7.4 10*3/uL (ref 1.7–7.7)
Neutrophils Relative %: 82 %
Neutrophils Relative %: 68 %
Platelets: 758 10*3/uL — ABNORMAL HIGH (ref 150–400)
Platelets: 675 10*3/uL — ABNORMAL HIGH (ref 150–400)
RBC: 3.81 MIL/uL — ABNORMAL LOW (ref 3.87–5.11)
RBC: 3.65 MIL/uL — ABNORMAL LOW (ref 3.87–5.11)
RDW: 15.5 % (ref 11.5–15.5)
RDW: 15.5 % (ref 11.5–15.5)
WBC: 12.8 10*3/uL — ABNORMAL HIGH (ref 4.0–10.5)
WBC: 11 10*3/uL — ABNORMAL HIGH (ref 4.0–10.5)

## 2017-07-04 LAB — LIPASE, BLOOD
Lipase: 17 U/L (ref 11–51)
Lipase: 19 U/L (ref 11–51)

## 2017-07-04 LAB — C DIFFICILE QUICK SCREEN W PCR REFLEX
C Diff antigen: NEGATIVE
C Diff interpretation: NOT DETECTED
C Diff toxin: NEGATIVE

## 2017-07-04 LAB — URINALYSIS, ROUTINE W REFLEX MICROSCOPIC
Bilirubin Urine: NEGATIVE
Glucose, UA: NEGATIVE mg/dL
Glucose, UA: NEGATIVE mg/dL
Ketones, ur: NEGATIVE mg/dL
Ketones, ur: NEGATIVE mg/dL
Nitrite: NEGATIVE
Nitrite: NEGATIVE
Protein, ur: 30 mg/dL — AB
Protein, ur: NEGATIVE mg/dL
Specific Gravity, Urine: 1.024 (ref 1.005–1.030)
Specific Gravity, Urine: 1.005 (ref 1.005–1.030)
pH: 6 (ref 5.0–8.0)
pH: 7 (ref 5.0–8.0)

## 2017-07-04 LAB — RETICULOCYTES
RBC.: 3.24 MIL/uL — ABNORMAL LOW (ref 3.87–5.11)
Retic Count, Absolute: 25.9 10*3/uL (ref 19.0–186.0)
Retic Ct Pct: 0.8 % (ref 0.4–3.1)

## 2017-07-04 LAB — C-REACTIVE PROTEIN: CRP: 22 mg/dL — ABNORMAL HIGH (ref <1.0)

## 2017-07-04 LAB — T4, FREE: Free T4: 1.01 ng/dL (ref 0.61–1.12)

## 2017-07-04 LAB — FERRITIN: Ferritin: 275 ng/mL (ref 11–307)

## 2017-07-04 LAB — URINALYSIS, MICROSCOPIC (REFLEX)

## 2017-07-04 LAB — VITAMIN B12: Vitamin B-12: 2997 pg/mL — ABNORMAL HIGH (ref 180–914)

## 2017-07-04 LAB — TSH: TSH: 0.121 u[IU]/mL — ABNORMAL LOW (ref 0.350–4.500)

## 2017-07-04 LAB — SEDIMENTATION RATE: Sed Rate: 99 mm/hr — ABNORMAL HIGH (ref 0–22)

## 2017-07-04 LAB — I-STAT BETA HCG BLOOD, ED (MC, WL, AP ONLY): I-stat hCG, quantitative: <5 m[IU]/mL (ref <5)

## 2017-07-04 LAB — CBC
HCT: 25.6 % — ABNORMAL LOW (ref 36.0–46.0)
Hemoglobin: 7.7 g/dL — ABNORMAL LOW (ref 12.0–15.0)
MCH: 23.8 pg — ABNORMAL LOW (ref 26.0–34.0)
MCHC: 30.1 g/dL (ref 30.0–36.0)
MCV: 79 fL (ref 78.0–100.0)
Platelets: 677 10*3/uL — ABNORMAL HIGH (ref 150–400)
RBC: 3.24 MIL/uL — ABNORMAL LOW (ref 3.87–5.11)
RDW: 15.2 % (ref 11.5–15.5)
WBC: 9.1 10*3/uL (ref 4.0–10.5)

## 2017-07-04 LAB — CREATININE, SERUM
Creatinine, Ser: 0.72 mg/dL (ref 0.44–1.00)
GFR calc Af Amer: >60 mL/min (ref >60)
GFR calc non Af Amer: >60 mL/min (ref >60)

## 2017-07-04 LAB — FOLATE: Folate: 8.5 ng/mL (ref >5.9)

## 2017-07-04 LAB — TROPONIN I: Troponin I: <0.03 ng/mL (ref <0.03)

## 2017-07-04 MED ORDER — MORPHINE SULFATE (PF) 4 MG/ML IV SOLN
4.0000 mg | Freq: Once | INTRAVENOUS | Status: AC
  Administered 2017-07-04: 4 mg via INTRAVENOUS
  Filled 2017-07-04: qty 1

## 2017-07-04 MED ORDER — SODIUM CHLORIDE 0.9 % IV BOLUS (SEPSIS)
1000.0000 mL | Freq: Once | INTRAVENOUS | Status: AC
  Administered 2017-07-04: 1000 mL via INTRAVENOUS

## 2017-07-04 MED ORDER — DEXTROSE 5 % IV SOLN
700.0000 mg | Freq: Three times a day (TID) | INTRAVENOUS | Status: DC
  Administered 2017-07-05 (×2): 700 mg via INTRAVENOUS
  Filled 2017-07-04 (×3): qty 14

## 2017-07-04 MED ORDER — LACTATED RINGERS IV BOLUS (SEPSIS)
1000.0000 mL | Freq: Once | INTRAVENOUS | Status: AC
Start: 2017-07-04 — End: 2017-07-04
  Administered 2017-07-04: 1000 mL via INTRAVENOUS

## 2017-07-04 MED ORDER — LACTATED RINGERS IV BOLUS (SEPSIS): 1000.0000 mL | Freq: Once | INTRAVENOUS | Status: DC

## 2017-07-04 MED ORDER — PIPERACILLIN-TAZOBACTAM 3.375 G IVPB
3.3750 g | Freq: Three times a day (TID) | INTRAVENOUS | Status: DC
  Administered 2017-07-04 – 2017-07-05 (×4): 3.375 g via INTRAVENOUS
  Filled 2017-07-04 (×11): qty 50

## 2017-07-04 MED ORDER — ACETAMINOPHEN 500 MG PO TABS
1000.0000 mg | ORAL_TABLET | Freq: Once | ORAL | Status: AC
  Administered 2017-07-04: 1000 mg via ORAL
  Filled 2017-07-04: qty 2

## 2017-07-04 MED ORDER — OXYCODONE-ACETAMINOPHEN 5-325 MG PO TABS
1.0000 | ORAL_TABLET | ORAL | Status: AC | PRN
  Administered 2017-07-04 (×2): 1 via ORAL
  Filled 2017-07-04: qty 1

## 2017-07-04 MED ORDER — OXYCODONE-ACETAMINOPHEN 5-325 MG PO TABS
ORAL_TABLET | ORAL | Status: AC
  Filled 2017-07-04: qty 1

## 2017-07-04 MED ORDER — ONDANSETRON HCL 4 MG PO TABS: 4.0000 mg | ORAL_TABLET | Freq: Four times a day (QID) | ORAL | Status: DC | PRN

## 2017-07-04 MED ORDER — POTASSIUM CHLORIDE CRYS ER 20 MEQ PO TBCR
40.0000 meq | EXTENDED_RELEASE_TABLET | Freq: Once | ORAL | Status: AC
  Administered 2017-07-04: 40 meq via ORAL
  Filled 2017-07-04: qty 2

## 2017-07-04 MED ORDER — ONDANSETRON HCL 4 MG/2ML IJ SOLN
4.0000 mg | Freq: Four times a day (QID) | INTRAMUSCULAR | Status: DC | PRN
  Administered 2017-07-04 – 2017-07-08 (×3): 4 mg via INTRAVENOUS
  Filled 2017-07-04 (×3): qty 2

## 2017-07-04 MED ORDER — PROMETHAZINE HCL 12.5 MG PO TABS: 12.5000 mg | ORAL_TABLET | Freq: Four times a day (QID) | ORAL | 0 refills | Status: DC | PRN

## 2017-07-04 MED ORDER — HYDROXYZINE HCL 10 MG PO TABS
10.0000 mg | ORAL_TABLET | Freq: Once | ORAL | Status: AC
  Administered 2017-07-04: 10 mg via ORAL
  Filled 2017-07-04: qty 1

## 2017-07-04 MED ORDER — MORPHINE SULFATE (PF) 4 MG/ML IV SOLN
4.0000 mg | INTRAVENOUS | Status: DC | PRN
  Administered 2017-07-05 (×4): 4 mg via INTRAVENOUS
  Filled 2017-07-04 (×4): qty 1

## 2017-07-04 MED ORDER — HYDROMORPHONE HCL 1 MG/ML IJ SOLN
1.0000 mg | INTRAMUSCULAR | Status: DC | PRN
  Administered 2017-07-04 (×2): 1 mg via INTRAVENOUS
  Filled 2017-07-04 (×2): qty 1

## 2017-07-04 MED ORDER — ONDANSETRON HCL 4 MG/2ML IJ SOLN
4.0000 mg | Freq: Once | INTRAMUSCULAR | Status: AC
  Administered 2017-07-04: 4 mg via INTRAVENOUS
  Filled 2017-07-04: qty 2

## 2017-07-04 MED ORDER — POTASSIUM CHLORIDE ER 10 MEQ PO TBCR: 10.0000 meq | EXTENDED_RELEASE_TABLET | Freq: Two times a day (BID) | ORAL | 0 refills | Status: DC

## 2017-07-04 MED ORDER — DEXTROSE-NACL 5-0.9 % IV SOLN
INTRAVENOUS | Status: DC
  Administered 2017-07-04 – 2017-07-08 (×4): via INTRAVENOUS

## 2017-07-04 MED ORDER — PROMETHAZINE HCL 25 MG/ML IJ SOLN
12.5000 mg | Freq: Once | INTRAMUSCULAR | Status: AC
  Administered 2017-07-04: 12.5 mg via INTRAMUSCULAR
  Filled 2017-07-04: qty 1

## 2017-07-04 MED ORDER — IOPAMIDOL (ISOVUE-300) INJECTION 61%
INTRAVENOUS | Status: AC
  Administered 2017-07-04: 100 mL
  Filled 2017-07-04: qty 100

## 2017-07-04 NOTE — ED Triage Notes (Addendum)
[facility] with plan to follow up w PCP and with GI specialist. (in triage pt stated she was sent [facility] for [facility] scan of abdomen, however this is not in the provider note and DC instructions) Pt has had 1 month of diarrhea, 2 days of emesis. Recent diagnosis of herpes and hemorrhoids. Pt states substance abuse, crack 1 week ago. Delivered a baby 6 weeks ago. Not breastfeeding. Pt states they gave her Iv fluids X 1 and tylenol [facility]. Blood work resulted from 10/10 at [facility].

## 2017-07-04 NOTE — ED Notes (Signed)
Admitting Team at the bedside

## 2017-07-04 NOTE — ED Notes (Signed)
Attempted to draw labs from pt's right AC without success.

## 2017-07-04 NOTE — H&P (Signed)
[facility] Admission History and Physical Service Pager: [phone]  Patient name: Latoya [name] Medical record number: [id number] Date of birth: 1991-03-27 Age: 26 y.o. Gender: female  Primary Care Provider: Fanny Bien, MD Consultants: none  Code Status: Full (obtained on admission)  Chief Complaint: Abdominal pain, diarrhea and emesis   Assessment and Plan: Latoya [name] is a 26 y.o. female presenting with abdominal pain . PMH is significant for HSV infection, substance abuse, depression, anxiety and anemia.   Abdominal Pain/bloody diarrhea/Emesis: concerning for IBD, particularly UC given the duration and CT finding. She has abdominal pain and bloody diarrhea for about a month. She had multiple ED visits. She had leukocytosis over 2 weeks & gradually downtrending hemoglobin level. It is also typical age for UC. She is diffusely tender to palpation. She has no rebound tenderness. She has angular stomatitis likely from micronutrient deficiency. Infectious colitis is also on differential diagnosis. CT also shows cholelithiasis but consistent abdominal pain is not typical with cholelithiasis. The bloody diarrhea could also be due to hemorrhoid. She has fever to 101 in maternal admission units this morning and leukocytosis to 11. She is about 7 weeks postpartum which makes endometriosis less likely. She denies vaginal discharge or vaginal bleeding. CMP with low albumin to 1.8 and borderline alkaline phosphatase. She received a liter of bolus in ED. -Admit to med surg. Dr. Ardelia Mems -Nothing by mouth. D5NS at 100 mL/h. -C. difficile and GI pathogen ordered in ED -Add ESR, CRP, fecal Calprotectin, FOBT -GI consulted -Zosyn for possible infectious colitis.  -Dilaudid 1 mg every 4 hours when necessary pain. -Zofran for nausea. No QTC prolongation.  Chest pain: Diffuse all over her chest. Likely musculoskeletal. Could also be somatic presentation of  underlying mood issue. No personal or family history of cardiac disease. Tachycardia likely due to GI issue. She has no dyspnea. -Troponin 1 to rule out ACS. -EKG -Pain med as above -Mood disorder as below  Depression/Anxiety: Concerning for postpartum depression. Cymbalta and Paxil listed in her medication list. She doesn't remember which one she takes. She says she took one of them 3 days ago. She has flat affect. She also had history of substance use which complicates the matter. She denies SI/HI. -hold home medications  Subclinical hyperthyroidism: TSH low, 0.121, but free T4 is WNL on 07/04/2017 -Outpatient follow-up  Genital herpes:  -Continue Valacyclovir BID  ?UTI: Patient without urinary symptoms. UA likely due to dirty catch versus UTI. Regardless, she is covered by Zosyn -Follow up urine culture  Iron deficiency Anemia: Hemoglobin slowly down trending from 11.7 about 2 weeks ago to 9.0. -Obtain anemia panel -Trend CBC  Substance use: recently UDS about a week ago with cocaine. She admits using cocaine about a week ago. She denies smoking cigarettes or drinking alcohol. -We will encourage her to stop.  FEN/GI:  -Nothing by mouth -IV fluid as above  Prophylaxis: SCD pending GI bleed and GI input.  Disposition: Admit to Med Surg  History of Present Illness:  Latoya [name] is a 26 y.o. female with PMH of depression, anxiety, substance abuse (cocaine), genital herpes who is presenting with 1 month history of diffuse abdominal pain and bloody diarrhea. The patient also reports a 1 month history of multiple loose bloody stools (7-10 per day) and 2 days of nausea and vomiting. Since that time the patient reports exacerbated abdominal pain and developing a fever as high as 101F and tachycardia to 140-150s  today at a  visit in [facility] hospital. She came in for further evaluation. Currently patient describes her abdominal pain as consistent and sharp "all over". She also reports  associated diffuse chest pain which she states is "all over" and exacerbated by vomiting. She denies pain or tingling in jaw or arms. She has no alleviating factors for her abdominal pain. She states use of pain medication which she does not recall the name. Patient reports associated loose stools, 7 yesterday with a small amount of blood in her stool. She does note history of external hemorrhoids since the birth of her second child 6 weeks ago. Patient does not recall when she last took her current medications. Of notes, patient has recent diagnosis of herpes and reports associated dysuria with this. She was prescribed Valtrex on 06/26/2017.  ED course: Vital signs with tachycardia to 154 that has trended down to 106. CBC with leukocytosis to 11.0 and hemoglobin to 9.0. CMP with albumin to 1.8 and alkaline phosphatase to 128 otherwise normal. Lipase within normal limits. CXR was in normal limits. CT abdomen with pancolitis and cholelithiasis. Family medicine was called to admit patient for further evaluation and management.  Review Of Systems: Per HPI with the following additions:   Review of Systems  Constitutional: Positive for chills, fever and malaise/fatigue.  HENT: Negative for sore throat.   Eyes: Negative for photophobia.  Respiratory: Negative for cough and shortness of breath.   Cardiovascular: Positive for chest pain (Diffuse ). Negative for palpitations and leg swelling.  Gastrointestinal: Positive for abdominal pain, blood in stool, diarrhea, nausea and vomiting.       Vomiting x1  Genitourinary: Positive for dysuria. Negative for hematuria and urgency.       Due to genital herpes  Musculoskeletal: Negative for back pain.  Neurological: Negative for dizziness, sensory change, focal weakness and headaches.  Psychiatric/Behavioral: Positive for substance abuse. Negative for suicidal ideas.       Cocaine  All other systems reviewed and are negative.   Patient Active Problem List    Diagnosis Date Noted  . Pancolitis ([place]) 07/04/2017  . Major depressive disorder, recurrent severe without psychotic features ([place]) 06/29/2017  . Cocaine abuse ([place]) 06/29/2017  . SVD (spontaneous vaginal delivery) 05/15/2017  . Crack cocaine use 05/15/2017  . Cervical dysplasia 01/16/2017  . Obesity (BMI 30.0-34.9) 01/12/2017  . History of substance abuse 01/12/2017  . History of gestational diabetes mellitus (GDM) 01/12/2017  . History of gestational hypertension 01/12/2017  . History of macrosomia in infant in prior pregnancy, currently pregnant 01/12/2017  . Short interval between pregnancies affecting pregnancy in second trimester, antepartum 01/12/2017  . Late prenatal care 01/12/2017  . Paxil use in early pregnancy 01/12/2017  . History of suicide attempt 01/12/2017  . Anxiety and depression 01/12/2017  . Supervision of high risk pregnancy, antepartum 01/10/2017  . GDM (gestational diabetes mellitus) 02/25/2016  . Obesity in pregnancy 02/25/2016    Past Medical History: Past Medical History:  Diagnosis Date  . Anemia   . Anxiety   . Depression   . Drug-seeking behavior   . Gestational diabetes   . History of substance abuse   . HSV infection   . Pregnancy induced hypertension     Past Surgical History: Past Surgical History:  Procedure Laterality Date  . DEEP NECK LYMPH NODE BIOPSY / EXCISION  2011    Social History: Social History  Substance Use Topics  . Smoking status: Never Smoker  . Smokeless tobacco: Never Used  . Alcohol use  No     Comment: "not for a while"   Additional social history: Patient is not currently working. She has two children whom her mother helps care for.  Please also refer to relevant sections of EMR.  Family History: Family History  Problem Relation Age of Onset  . Diabetes Mother   . Hypertension Mother   . Hypertension Father   . Stroke Maternal Grandfather     Allergies and Medications: No Known Allergies No current  facility-administered medications on file prior to encounter.    Current Outpatient Prescriptions on File Prior to Encounter  Medication Sig Dispense Refill  . dicyclomine (BENTYL) 20 MG tablet Take 1 tablet (20 mg total) by mouth 2 (two) times daily. 20 tablet 0  . DULoxetine (CYMBALTA) 30 MG capsule Take 1 capsule (30 mg total) by mouth daily. 30 capsule 0  . gabapentin (NEURONTIN) 300 MG capsule Take 1 capsule (300 mg total) by mouth 3 (three) times daily. 90 capsule 0  . hydrocortisone (ANUSOL-HC) 25 MG suppository Place 1 suppository (25 mg total) rectally 2 (two) times daily. 12 suppository 1  . ketorolac (TORADOL) 10 MG tablet Take 1 tablet (10 mg total) by mouth every 6 (six) hours as needed. 20 tablet 0  . loperamide (IMODIUM) 2 MG capsule Take 2 capsules (4 mg total) by mouth at bedtime. 12 capsule 0  . naproxen (NAPROSYN) 500 MG tablet Take 1 tablet (500 mg total) by mouth 2 (two) times daily. 30 tablet 0  . ondansetron (ZOFRAN ODT) 4 MG disintegrating tablet Take 1 tablet (4 mg total) by mouth every 8 (eight) hours as needed for nausea or vomiting. 20 tablet 0  . oxyCODONE-acetaminophen (PERCOCET/ROXICET) 5-325 MG tablet Take 1 tablet by mouth every 4 (four) hours as needed for severe pain. 30 tablet 0  . potassium chloride (K-DUR) 10 MEQ tablet Take 1 tablet (10 mEq total) by mouth 2 (two) times daily. 10 tablet 0  . promethazine (PHENERGAN) 12.5 MG tablet Take 1 tablet (12.5 mg total) by mouth every 6 (six) hours as needed for nausea or vomiting. 30 tablet 0  . traMADol (ULTRAM) 50 MG tablet Take 1 tablet (50 mg total) by mouth every 6 (six) hours as needed. (Patient taking differently: Take 50 mg by mouth every 6 (six) hours as needed for moderate pain. ) 15 tablet 0  . valACYclovir (VALTREX) 1000 MG tablet Take 1 tablet (1,000 mg total) by mouth 2 (two) times daily. 20 tablet 0  . PARoxetine (PAXIL-CR) 25 MG 24 hr tablet Take 1 tablet (25 mg total) by mouth daily. (Patient not  taking: Reported on 07/04/2017) 31 tablet 12  . potassium chloride SA (K-DUR,KLOR-CON) 20 MEQ tablet Take 2 tablets (40 mEq total) by mouth 2 (two) times daily. 12 tablet 0    Objective: BP 109/66   Pulse (!) 102   Temp 98.3 F (36.8 C) (Oral)   Resp 20   LMP  (LMP Unknown)   SpO2 100%  Exam: General: Uncomfortable in bed, teary eyed. Non-toxic appearing Eyes: PERRL, no conjunctival pallor, sclera anicteric ENTM: Oropharynx clear and moist. Patient has visible angular stomatitis  Neck: Trachea is midline, no lymphadenopathy  Cardiovascular: Regular rhythm. Tachycardic. No murmurs Respiratory: CTAB, good respiratory effort. No wheezing or crackles Gastrointestinal: Soft, BS present, no distension. Tender to palpation diffusely. No rebound tenderness MSK: DP pulses strong. No LE edema Derm: Warm and dry. Neuro: Fluent speech. Alert, no focal neuro deficits Psych: Responds to questions appropriately but flat affect  Labs and Imaging: CBC BMET   Recent Labs Lab 07/04/17 0823  WBC 11.0*  HGB 9.0*  HCT 28.2*  PLT 675*    Recent Labs Lab 07/04/17 0823  NA 135  K 3.8  CL 97*  CO2 24  BUN 13  CREATININE 0.89  GLUCOSE 102*  CALCIUM 8.3Alisa Graff, Medical Student 07/04/2017, 1:44 PM [place] Intern pager: [phone], text pages welcome  I have seen and evaluated the patient with Student Dr. Retia Passe. I am in agreement with the note above in its revised form.  Wendee Beavers, MD, PGY-3 07/04/2017 3:36 PM

## 2017-07-04 NOTE — Progress Notes (Signed)
Pharmacy Anti-infective Note  IMAGINE NEST is a 26 y.o. female admitted on 07/04/2017 with intra-abdominal infection, now w/ concern for HSV component to illness.  Pharmacy has been consulted for acyclovir dosing w/ plan to start w/ IV and transition to PO.  Plan: Acyclovir 722m IV every eight hours. Plan to transition to acyclovir 2015mPO five times daily.   Temp (24hrs), Avg:99.1 F (37.3 C), Min:98 F (36.7 C), Max:101.1 F (38.4 C)   Recent Labs Lab 06/28/17 1706 07/04/17 0144 07/04/17 0823 07/04/17 2055  WBC 11.8* 12.8* 11.0* 9.1  CREATININE 0.90 0.85 0.89 0.72    Estimated Creatinine Clearance: 125.1 mL/min (by C-G formula based on SCr of 0.72 mg/dL).    No Known Allergies   Thank you for allowing pharmacy to be a part of this patient's care.  VeWynona NeatPharmD, BCPS  07/04/2017 11:02 PM

## 2017-07-04 NOTE — ED Notes (Signed)
Attempted to draw blood in left arm, no success. RN notified

## 2017-07-04 NOTE — ED Notes (Signed)
Pt taken to CT.

## 2017-07-04 NOTE — ED Notes (Signed)
Made aware of sample need. Unable to give at this time. Bedside commode present.

## 2017-07-04 NOTE — ED Provider Notes (Signed)
[facility] DEPT Provider Note   CSN: [id number] Arrival date & time: 07/04/17  2130     History   Chief Complaint Chief Complaint  Patient presents with  . Abdominal Pain  . Diarrhea  . Emesis    HPI Latoya [name] is a 26 y.o. female.  The history is provided by the patient.  Abdominal Pain   This is a new problem. The current episode started more than 1 week ago. The problem occurs constantly. The problem has not changed since onset.The pain is associated with an unknown factor. The pain is located in the generalized abdominal region. The pain is moderate. Associated symptoms include fever. Pertinent negatives include constipation, dysuria and frequency. The symptoms are aggravated by palpation. Nothing relieves the symptoms.   26 year old female who presents nausea, vomiting, diarrhea and abdominal pain. She is 6 weeks post partum from NSVD. Reports 1 month of diarrhea, and nausea, and vomiting that started 1-2 days ago. She was seen at [facility] today, where was was noted to have tachycardia 140-150s and fever 101.33F. Denies urinary complaints, abnormal vaginal bleeding or discharge. She reports she was diagnosed with HSV genital, hemorrhoids and told that she had low thyroid level.  Past Medical History:  Diagnosis Date  . Anemia   . Anxiety   . Depression   . Drug-seeking behavior   . Gestational diabetes   . History of substance abuse   . HSV infection   . Pregnancy induced hypertension     Patient Active Problem List   Diagnosis Date Noted  . Major depressive disorder, recurrent severe without psychotic features ([place]) 06/29/2017  . Cocaine abuse ([place]) 06/29/2017  . SVD (spontaneous vaginal delivery) 05/15/2017  . Crack cocaine use 05/15/2017  . Cervical dysplasia 01/16/2017  . Obesity (BMI 30.0-34.9) 01/12/2017  . History of substance abuse 01/12/2017  . History of gestational diabetes mellitus (GDM) 01/12/2017  . History of gestational  hypertension 01/12/2017  . History of macrosomia in infant in prior pregnancy, currently pregnant 01/12/2017  . Short interval between pregnancies affecting pregnancy in second trimester, antepartum 01/12/2017  . Late prenatal care 01/12/2017  . Paxil use in early pregnancy 01/12/2017  . History of suicide attempt 01/12/2017  . Anxiety and depression 01/12/2017  . Supervision of high risk pregnancy, antepartum 01/10/2017  . GDM (gestational diabetes mellitus) 02/25/2016  . Obesity in pregnancy 02/25/2016    Past Surgical History:  Procedure Laterality Date  . DEEP NECK LYMPH NODE BIOPSY / EXCISION  2011    OB History    Gravida Para Term Preterm AB Living   2 2 2  0 0 2   SAB TAB Ectopic Multiple Live Births   0 0 0 0 2       Home Medications    Prior to Admission medications   Medication Sig Start Date End Date Taking? Authorizing Provider  dicyclomine (BENTYL) 20 MG tablet Take 1 tablet (20 mg total) by mouth 2 (two) times daily. 06/24/17  Yes Nanavati, Ankit, MD  DULoxetine (CYMBALTA) 30 MG capsule Take 1 capsule (30 mg total) by mouth daily. 07/01/17  Yes Ethelene Hal, NP  gabapentin (NEURONTIN) 300 MG capsule Take 1 capsule (300 mg total) by mouth 3 (three) times daily. 06/30/17  Yes Ethelene Hal, NP  hydrocortisone (ANUSOL-HC) 25 MG suppository Place 1 suppository (25 mg total) rectally 2 (two) times daily. 06/17/17  Yes Marcille Buffy D, CNM  ketorolac (TORADOL) 10 MG tablet Take 1 tablet (10 mg  total) by mouth every 6 (six) hours as needed. 06/30/17  Yes Burleson, Terri L, NP  lidocaine (XYLOCAINE) 2 % jelly Apply 1 application topically as needed.   Yes [provider]  loperamide (IMODIUM) 2 MG capsule Take 2 capsules (4 mg total) by mouth at bedtime. 06/24/17  Yes Nanavati, Ankit, MD  naproxen (NAPROSYN) 500 MG tablet Take 1 tablet (500 mg total) by mouth 2 (two) times daily. 06/17/17  Yes Marcille Buffy D, CNM  nitroGLYCERIN (NITROGLYN) 2 %  ointment Apply 0.5 inches topically 2 (two) times daily.   Yes [provider]  ondansetron (ZOFRAN ODT) 4 MG disintegrating tablet Take 1 tablet (4 mg total) by mouth every 8 (eight) hours as needed for nausea or vomiting. 06/24/17  Yes Varney Biles, MD  oxyCODONE-acetaminophen (PERCOCET/ROXICET) 5-325 MG tablet Take 1 tablet by mouth every 4 (four) hours as needed for severe pain. 06/30/17  Yes Burleson, Terri L, NP  potassium chloride (K-DUR) 10 MEQ tablet Take 1 tablet (10 mEq total) by mouth 2 (two) times daily. 07/04/17 07/09/17 Yes Degele, Jenne Pane, MD  promethazine (PHENERGAN) 12.5 MG tablet Take 1 tablet (12.5 mg total) by mouth every 6 (six) hours as needed for nausea or vomiting. 07/04/17  Yes Degele, Jenne Pane, MD  traMADol (ULTRAM) 50 MG tablet Take 1 tablet (50 mg total) by mouth every 6 (six) hours as needed. Patient taking differently: Take 50 mg by mouth every 6 (six) hours as needed for moderate pain.  06/17/17  Yes Tresea Mall, CNM  valACYclovir (VALTREX) 1000 MG tablet Take 1 tablet (1,000 mg total) by mouth 2 (two) times daily. 06/26/17 07/06/17 Yes Rasch, Artist Pais, NP  PARoxetine (PAXIL-CR) 25 MG 24 hr tablet Take 1 tablet (25 mg total) by mouth daily. Patient not taking: Reported on 07/04/2017 05/16/17   Emily Filbert, MD  potassium chloride SA (K-DUR,KLOR-CON) 20 MEQ tablet Take 2 tablets (40 mEq total) by mouth 2 (two) times daily. 06/22/17 06/25/17  Starr Lake, CNM    Family History Family History  Problem Relation Age of Onset  . Diabetes Mother   . Hypertension Mother   . Hypertension Father   . Stroke Maternal Grandfather     Social History Social History  Substance Use Topics  . Smoking status: Never Smoker  . Smokeless tobacco: Never Used  . Alcohol use No     Comment: "not for a while"     Allergies   Patient has no known allergies.   Review of Systems Review of Systems  Constitutional: Positive for fatigue and fever.    Respiratory: Negative for cough.   Gastrointestinal: Negative for constipation.  Genitourinary: Negative for dysuria and frequency.  All other systems reviewed and are negative.    Physical Exam Updated Vital Signs BP 103/67   Pulse (!) 102   Temp 98.3 F (36.8 C) (Oral)   Resp 15   LMP  (LMP Unknown)   SpO2 100%   Physical Exam Physical Exam  Nursing note and vitals reviewed. Constitutional: Non-toxic, and in no acute distress Head: Normocephalic and atraumatic.  Mouth/Throat: Oropharynx is clear and moist.  Neck: Normal range of motion. Neck supple.  Cardiovascular: Tachycardic rate and regular rhythm.   Pulmonary/Chest: Effort normal and breath sounds normal.  Abdominal: Soft. There is diffuse tenderness. There is no rebound and no guarding.  Musculoskeletal: No deformities Neurological: Alert, no facial droop, fluent speech, moves all extremities symmetrically Skin: Skin is warm and dry.  Psychiatric: Cooperative  ED Treatments / Results  Labs (all labs ordered are listed, but only abnormal results are displayed) Labs Reviewed  CBC WITH DIFFERENTIAL/PLATELET - Abnormal; Notable for the following:       Result Value   WBC 11.0 (*)    RBC 3.65 (*)    Hemoglobin 9.0 (*)    HCT 28.2 (*)    MCV 77.3 (*)    MCH 24.7 (*)    Platelets 675 (*)    Monocytes Absolute 1.8 (*)    All other components within normal limits  COMPREHENSIVE METABOLIC PANEL - Abnormal; Notable for the following:    Chloride 97 (*)    Glucose, Bld 102 (*)    Calcium 8.3 (*)    Albumin 1.8 (*)    Alkaline Phosphatase 128 (*)    All other components within normal limits  URINALYSIS, ROUTINE W REFLEX MICROSCOPIC - Abnormal; Notable for the following:    APPearance HAZY (*)    Hgb urine dipstick MODERATE (*)    Leukocytes, UA MODERATE (*)    All other components within normal limits  URINALYSIS, MICROSCOPIC (REFLEX) - Abnormal; Notable for the following:    Bacteria, UA MANY (*)     Squamous Epithelial / LPF 6-30 (*)    All other components within normal limits  GASTROINTESTINAL PANEL BY PCR, STOOL (REPLACES STOOL CULTURE)  LIPASE, BLOOD  T4, FREE  SEDIMENTATION RATE  C-REACTIVE PROTEIN  I-STAT BETA HCG BLOOD, ED ([facility], [facility], [facility] ONLY)    EKG  EKG Interpretation  Date/Time:  Wednesday July 04 2017 08:07:57 EDT Ventricular Rate:  118 PR Interval:    QRS Duration: 113 QT Interval:  321 QTC Calculation: 450 R Axis:   82 Text Interpretation:  Sinus tachycardia Borderline intraventricular conduction delay other than tachycardia no significant change  Confirmed by Brantley Stage [id number]) on 07/04/2017 8:30:58 AM       Radiology Dg Chest 2 View  Result Date: 07/04/2017 CLINICAL DATA:  Fever and chest and abdominal pain EXAM: CHEST  2 VIEW COMPARISON:  Chest x-ray dated October 10, 2016 FINDINGS: The lungs are well-expanded and clear. The heart and pulmonary vascularity are normal. The mediastinum is normal in width. There is no pleural effusion. The gas pattern in the upper abdomen is normal. The bony thorax is unremarkable. IMPRESSION: There is no active cardiopulmonary disease. Electronically Signed   By: David  Martinique M.D.   On: 07/04/2017 08:57   Ct Abdomen Pelvis W Contrast  Result Date: 07/04/2017 CLINICAL DATA:  Lower abdominal pain and diarrhea for 1 month. EXAM: CT ABDOMEN AND PELVIS WITH CONTRAST TECHNIQUE: Multidetector CT imaging of the abdomen and pelvis was performed using the standard protocol following bolus administration of intravenous contrast. CONTRAST:  138m ISOVUE-300 IOPAMIDOL (ISOVUE-300) INJECTION 61% COMPARISON:  05/19/2011 CT abdomen/pelvis. FINDINGS: Lower chest: No significant pulmonary nodules or acute consolidative airspace disease. Hepatobiliary: Normal liver with no liver mass. Cholelithiasis, with no gallbladder wall thickening or pericholecystic fluid. No biliary ductal dilatation. Pancreas: Normal, with no mass or duct dilation. Spleen:  Normal size. No mass. Adrenals/Urinary Tract: Normal adrenals. Normal kidneys with no hydronephrosis and no renal mass. Normal bladder. Stomach/Bowel: Grossly normal stomach. Normal caliber small bowel with no small bowel wall thickening. Appendix is within normal limits. There is diffuse mild-to-moderate colorectal wall thickening with associated pericolonic fat stranding without skip lesions. Vascular/Lymphatic: Normal caliber abdominal aorta. Patent portal, splenic, hepatic and renal veins. Mild right mesenteric adenopathy measuring up to 1.1 cm (series 3/ image 54), new.  Reproductive: Grossly normal uterus.  No adnexal mass. Other: No pneumoperitoneum, ascites or focal fluid collection. Musculoskeletal: No aggressive appearing focal osseous lesions. IMPRESSION: 1. Nonspecific infectious or inflammatory pancolitis, with the differential including inflammatory bowel disease. No obstruction, perforation, fistula or abscess. 2. Nonspecific mild right mesenteric adenopathy, probably reactive. 3. Cholelithiasis. No CT findings of acute cholecystitis. No biliary ductal dilatation. Electronically Signed   By: Ilona Sorrel M.D.   On: 07/04/2017 10:16    Procedures Procedures (including critical care time)  Medications Ordered in ED Medications  oxyCODONE-acetaminophen (PERCOCET/ROXICET) 5-325 MG per tablet 1 tablet (1 tablet Oral Given 07/04/17 0640)  oxyCODONE-acetaminophen (PERCOCET/ROXICET) 5-325 MG per tablet (0 tablets  Hold 07/04/17 0645)  ondansetron (ZOFRAN) injection 4 mg (4 mg Intravenous Given 07/04/17 0829)  sodium chloride 0.9 % bolus 1,000 mL (0 mLs Intravenous Stopped 07/04/17 0942)  morphine 4 MG/ML injection 4 mg (4 mg Intravenous Given 07/04/17 0830)  iopamidol (ISOVUE-300) 61 % injection (100 mLs  Contrast Given 07/04/17 0946)     Initial Impression / Assessment and Plan / ED Course  I have reviewed the triage vital signs and the nursing notes.  Pertinent labs & imaging results that  were available during my care of the patient were reviewed by me and considered in my medical decision making (see chart for details).     26 year old female, 6 weeks postpartum from NSVD, who presents with abdominal pain, vomiting and diarrhea. Records are reviewed, and she was febrile 101.1 Fahrenheit and tachycardic with heart rate in the 150s [facility] prior to coming here to the ED.  No longer febrile, but continues to be tachycardic here in the ED. Normotensive. Abdomen is soft and non-peritoneal, but she has diffuse tenderness. She has mild leukocytosis, stable anemia. Underwent CT abdomen and pelvis to evaluate for appendicitis, colitis or other serious intra-abdominal process. This shows evidence of pancolitis that could be infectious versus inflammatory. She had C. difficile testing earlier today that was unremarkable. Additional stool studies are sent on her. Given IV fluids, with improvement in her tachycardia.  Discussed with internal medicine teaching service who will admit for GI workup.  Final Clinical Impressions(s) / ED Diagnoses   Final diagnoses:  Pancolitis [facility])    New Prescriptions New Prescriptions   No medications on file     Forde Dandy, MD 07/04/17 1211

## 2017-07-04 NOTE — Discharge Instructions (Signed)
-   Please call your primary care provider ASAP for follow up and a referral to gastroenterology (GI) for your persistent diarrhea. - You also need to be seen for your thyroid. Your TSH (thyroid stimulating hormone) level is low, indicating possible hyperthyroidism. - I am including a copy of your lab results, please take these with you when you see your primary care provider.   Results for orders placed or performed during the hospital encounter of 07/04/17  Urinalysis, Routine w reflex microscopic  Result Value Ref Range   Color, Urine AMBER (A) YELLOW   APPearance CLOUDY (A) CLEAR   Specific Gravity, Urine 1.024 1.005 - 1.030   pH 6.0 5.0 - 8.0   Glucose, UA NEGATIVE NEGATIVE mg/dL   Hgb urine dipstick SMALL (A) NEGATIVE   Bilirubin Urine SMALL (A) NEGATIVE   Ketones, ur NEGATIVE NEGATIVE mg/dL   Protein, ur 30 (A) NEGATIVE mg/dL   Nitrite NEGATIVE NEGATIVE   Leukocytes, UA LARGE (A) NEGATIVE   RBC / HPF 0-5 0 - 5 RBC/hpf   WBC, UA TOO NUMEROUS TO COUNT 0 - 5 WBC/hpf   Bacteria, UA RARE (A) NONE SEEN   Squamous Epithelial / LPF 6-30 (A) NONE SEEN   Mucus PRESENT   CBC with Differential/Platelet  Result Value Ref Range   WBC 12.8 (H) 4.0 - 10.5 K/uL   RBC 3.81 (L) 3.87 - 5.11 MIL/uL   Hemoglobin 9.3 (L) 12.0 - 15.0 g/dL   HCT 29.5 (L) 36.0 - 46.0 %   MCV 77.4 (L) 78.0 - 100.0 fL   MCH 24.4 (L) 26.0 - 34.0 pg   MCHC 31.5 30.0 - 36.0 g/dL   RDW 15.5 11.5 - 15.5 %   Platelets 758 (H) 150 - 400 K/uL   Neutrophils Relative % 82 %   Neutro Abs 10.5 (H) 1.7 - 7.7 K/uL   Lymphocytes Relative 11 %   Lymphs Abs 1.5 0.7 - 4.0 K/uL   Monocytes Relative 7 %   Monocytes Absolute 0.9 0.1 - 1.0 K/uL   Eosinophils Relative 0 %   Eosinophils Absolute 0.0 0.0 - 0.7 K/uL   Basophils Relative 0 %   Basophils Absolute 0.0 0.0 - 0.1 K/uL  Comprehensive metabolic panel  Result Value Ref Range   Sodium 134 (L) 135 - 145 mmol/L   Potassium 3.4 (L) 3.5 - 5.1 mmol/L   Chloride 94 (L) 101 - 111  mmol/L   CO2 23 22 - 32 mmol/L   Glucose, Bld 108 (H) 65 - 99 mg/dL   BUN 15 6 - 20 mg/dL   Creatinine, Ser 0.85 0.44 - 1.00 mg/dL   Calcium 8.3 (L) 8.9 - 10.3 mg/dL   Total Protein 7.4 6.5 - 8.1 g/dL   Albumin 2.0 (L) 3.5 - 5.0 g/dL   AST 24 15 - 41 U/L   ALT 25 14 - 54 U/L   Alkaline Phosphatase 141 (H) 38 - 126 U/L   Total Bilirubin 0.7 0.3 - 1.2 mg/dL   GFR calc non Af Amer >60 >60 mL/min   GFR calc Af Amer >60 >60 mL/min   Anion gap 17 (H) 5 - 15  Lipase, blood  Result Value Ref Range   Lipase 17 11 - 51 U/L  TSH  Result Value Ref Range   TSH 0.121 (L) 0.350 - 4.500 uIU/mL

## 2017-07-04 NOTE — ED Notes (Signed)
Provider at the bedside.  
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
## 2017-07-04 NOTE — ED Notes (Signed)
Pt has not had a bowel movement to collect sample

## 2017-07-04 NOTE — Progress Notes (Signed)
Pharmacy Antibiotic Note  Latoya [name] is a 26 y.o. female admitted on 07/04/2017 with Intra-abdominal infection.  Pharmacy has been consulted for Zosyn dosing.  Plan: Zosyn 3.375g IV q8h (4 hour infusion). Monitor clinical progress, cultures/sensitivities, renal function, abx plan    Temp (24hrs), Avg:99.1 F (37.3 C), Min:98 F (36.7 C), Max:101.1 F (38.4 C)   Recent Labs Lab 06/28/17 1706 07/04/17 0144 07/04/17 0823  WBC 11.8* 12.8* 11.0*  CREATININE 0.90 0.85 0.89    Estimated Creatinine Clearance: 112.5 mL/min (by C-G formula based on SCr of 0.89 mg/dL).    No Known Allergies  Antimicrobials this admission: 10/10 Zosyn >>   Dose adjustments this admission:  Microbiology results: 10/10 GI panel>>   Thank you for allowing [place] to participate in this patients care.  Jens Som, PharmD Clinical phone for 07/04/2017 from 3:30-10:30p: [phone] If after 10:30p, please call main pharmacy at: [phone] 07/04/2017 8:12 PM

## 2017-07-04 NOTE — ED Notes (Signed)
Pt still unable to have a bowel movement to collect sample

## 2017-07-04 NOTE — ED Notes (Signed)
Pt taken to Xray.

## 2017-07-04 NOTE — Progress Notes (Signed)
NURSING PROGRESS NOTE  [name] [id number] Admission Data: 07/04/2017 9:46 PM Attending Provider: Lind Covert, MD [id number], Mechele Claude, MD Code Status: Full  Latoya [name] is a 26 y.o. female patient admitted from ED:  -No acute distress noted.  -No complaints of shortness of breath.  -No complaints of chest pain.   Cardiac Monitoring: N/A  Blood pressure 111/69, pulse (!) 107, temperature 98 F (36.7 C), temperature source Oral, resp. rate 18, SpO2 100 %, unknown if currently breastfeeding.   IV Fluids:  IV in place, occlusive dsg intact without redness, IV cath hand right, condition patent and no redness D5W/0.9 NaCl.   Allergies:  Patient has no known allergies.  Past Medical History:   has a past medical history of Anemia; Anxiety; Depression; Drug-seeking behavior; Gestational diabetes; History of substance abuse; HSV infection; and Pregnancy induced hypertension.  Past Surgical History:   has a past surgical history that includes Deep neck lymph node biopsy / excision (2011).  Social History:   reports that she has never smoked. She has never used smokeless tobacco. She reports that she uses drugs, including "Crack" cocaine and Cocaine. She reports that she does not drink alcohol.  Skin: lesions noted to perineal area  Patient/Family orientated to room. Information packet given to patient/family. Admission inpatient armband information verified with patient/family to include name and date of birth and placed on patient arm. Side rails up x 2, fall assessment and education completed with patient/family. Patient/family able to verbalize understanding of risk associated with falls and verbalized understanding to call for assistance before getting out of bed. Call light within reach. Patient/family able to voice and demonstrate understanding of unit orientation instructions.

## 2017-07-04 NOTE — ED Notes (Signed)
Pt up on the bedside commode. Encouraged to get urine sample.

## 2017-07-04 NOTE — ED Notes (Signed)
Attempted report x1. 

## 2017-07-04 NOTE — MAU Provider Note (Signed)
History     CSN: [id number]  Arrival date and time: 07/04/17 0028   First Provider Initiated Contact with Patient 07/04/17 0121      Chief Complaint  Patient presents with  . Abdominal Pain  . Emesis  . Diarrhea    HPI: Latoya [name] is a 26 y.o. [phone] about 7 weeks postpartum, recent vulvar HSV outbreak, hx of depression, anxiety, substance abuse, and hemorrhoids, who presents to MAU d/t persistent nausea, vomiting, diarrhea and abdominal pain. She reports that she has had diarrhea for about month, having about 10 loose and watery BMs a day. She has been seen for this multiple times. Has been unresponsive to anti-diarrheals. Reports not tolerating much po. With regards to vomiting reports this is mostly spit up a few times a day. Reports significant pain with hemorrhoids given frequent BMs. She  is unsure if she has been recently on antibiotics.  Denies fever, chills, SOB, CP, cough, congestion, rhinorrhea, urinary frequency, hematuria, vaginal discharge, dizziness, or LOC.  Past obstetric history: OB History  Gravida Para Term Preterm AB Living  2 2 2  0 0 2  SAB TAB Ectopic Multiple Live Births  0 0 0 0 2    # Outcome Date GA Lbr Len/2nd Weight Sex Delivery Anes PTL Lv  2 Term 05/15/17 66w5d08:15 / 00:19 7 lb 9.2 oz (3.436 kg) F Vag-Spont EPI  LIV  1 Term 03/27/16 333w2d3:44 / 02:35 9 lb 2.4 oz (4.15 kg) M Vag-Spont EPI  LIV      Past Medical History:  Diagnosis Date  . Anemia   . Anxiety   . Depression   . Gestational diabetes   . History of substance abuse   . HSV infection   . Pregnancy induced hypertension     Past Surgical History:  Procedure Laterality Date  . DEEP NECK LYMPH NODE BIOPSY / EXCISION  2011    Family History  Problem Relation Age of Onset  . Diabetes Mother   . Hypertension Mother   . Hypertension Father   . Stroke Maternal Grandfather     Social History  Substance Use Topics  . Smoking status: Never Smoker  . Smokeless  tobacco: Never Used  . Alcohol use No     Comment: "not for a while"    Allergies: No Known Allergies  Prescriptions Prior to Admission  Medication Sig Dispense Refill Last Dose  . gabapentin (NEURONTIN) 300 MG capsule Take 1 capsule (300 mg total) by mouth 3 (three) times daily. 90 capsule 0 07/03/2017 at Unknown time  . ondansetron (ZOFRAN ODT) 4 MG disintegrating tablet Take 1 tablet (4 mg total) by mouth every 8 (eight) hours as needed for nausea or vomiting. 20 tablet 0 07/03/2017 at Unknown time  . oxyCODONE-acetaminophen (PERCOCET/ROXICET) 5-325 MG tablet Take 1 tablet by mouth every 4 (four) hours as needed for severe pain. 30 tablet 0 07/03/2017 at Unknown time  . dicyclomine (BENTYL) 20 MG tablet Take 1 tablet (20 mg total) by mouth 2 (two) times daily. 20 tablet 0 06/27/2017 at Unknown time  . DULoxetine (CYMBALTA) 30 MG capsule Take 1 capsule (30 mg total) by mouth daily. 30 capsule 0   . hydrocortisone (ANUSOL-HC) 25 MG suppository Place 1 suppository (25 mg total) rectally 2 (two) times daily. 12 suppository 1 Past Month at Unknown time  . ketorolac (TORADOL) 10 MG tablet Take 1 tablet (10 mg total) by mouth every 6 (six) hours as needed. 20 tablet 0   .  loperamide (IMODIUM) 2 MG capsule Take 2 capsules (4 mg total) by mouth at bedtime. 12 capsule 0 Past Week at Unknown time  . naproxen (NAPROSYN) 500 MG tablet Take 1 tablet (500 mg total) by mouth 2 (two) times daily. 30 tablet 0 06/28/2017 at Unknown time  . PARoxetine (PAXIL-CR) 25 MG 24 hr tablet Take 1 tablet (25 mg total) by mouth daily. 31 tablet 12 Past Week at Unknown time  . potassium chloride SA (K-DUR,KLOR-CON) 20 MEQ tablet Take 2 tablets (40 mEq total) by mouth 2 (two) times daily. 12 tablet 0 06/25/2017  . sertraline (ZOLOFT) 50 MG tablet Take 50 mg by mouth 2 (two) times daily.  0 Past Week at Unknown time  . traMADol (ULTRAM) 50 MG tablet Take 1 tablet (50 mg total) by mouth every 6 (six) hours as needed. (Patient not  taking: Reported on 06/28/2017) 15 tablet 0 Completed Course at Unknown time  . valACYclovir (VALTREX) 1000 MG tablet Take 1 tablet (1,000 mg total) by mouth 2 (two) times daily. 20 tablet 0 06/28/2017 at Unknown time    Review of Systems - Negative except for what is mentioned in HPI.  Physical Exam   Blood pressure 104/66, pulse (!) 150, temperature 99.1 F (37.3 C), temperature source Oral, resp. rate 20, height 5' 9"  (1.753 m), weight 184 lb (83.5 kg), SpO2 99 %, unknown if currently breastfeeding.  Constitutional: Well-developed, well-nourished female in no acute distress.  HENT: [place]/AT, normal oropharynx mucosa. MMM Eyes: normal conjunctivae, no scleral icterus Cardiovascular: rate 110 during exam, regular rhythm Respiratory: normal effort, lungs CTAB.  GI: Abd soft, mild epigastric tenderness, no rebound or guarding.  MSK: Extremities nontender, no edema, normal ROM Neurologic: Alert and oriented x 4. Psych: Normal mood and affect Skin: warm and dry    MAU Course  Procedures  MDM Patient seen and evaluated. Labs ordered and phenergan given. Records reviewed. Had recent negative stool studies and C. Diff.  Labs reviewed: Results for orders placed or performed during the hospital encounter of 07/04/17  Urinalysis, Routine w reflex microscopic  Result Value Ref Range   Color, Urine AMBER (A) YELLOW   APPearance CLOUDY (A) CLEAR   Specific Gravity, Urine 1.024 1.005 - 1.030   pH 6.0 5.0 - 8.0   Glucose, UA NEGATIVE NEGATIVE mg/dL   Hgb urine dipstick SMALL (A) NEGATIVE   Bilirubin Urine SMALL (A) NEGATIVE   Ketones, ur NEGATIVE NEGATIVE mg/dL   Protein, ur 30 (A) NEGATIVE mg/dL   Nitrite NEGATIVE NEGATIVE   Leukocytes, UA LARGE (A) NEGATIVE   RBC / HPF 0-5 0 - 5 RBC/hpf   WBC, UA TOO NUMEROUS TO COUNT 0 - 5 WBC/hpf   Bacteria, UA RARE (A) NONE SEEN   Squamous Epithelial / LPF 6-30 (A) NONE SEEN   Mucus PRESENT   CBC with Differential/Platelet  Result Value Ref Range    WBC 12.8 (H) 4.0 - 10.5 K/uL   RBC 3.81 (L) 3.87 - 5.11 MIL/uL   Hemoglobin 9.3 (L) 12.0 - 15.0 g/dL   HCT 29.5 (L) 36.0 - 46.0 %   MCV 77.4 (L) 78.0 - 100.0 fL   MCH 24.4 (L) 26.0 - 34.0 pg   MCHC 31.5 30.0 - 36.0 g/dL   RDW 15.5 11.5 - 15.5 %   Platelets 758 (H) 150 - 400 K/uL   Neutrophils Relative % 82 %   Neutro Abs 10.5 (H) 1.7 - 7.7 K/uL   Lymphocytes Relative 11 %   Lymphs Abs  1.5 0.7 - 4.0 K/uL   Monocytes Relative 7 %   Monocytes Absolute 0.9 0.1 - 1.0 K/uL   Eosinophils Relative 0 %   Eosinophils Absolute 0.0 0.0 - 0.7 K/uL   Basophils Relative 0 %   Basophils Absolute 0.0 0.0 - 0.1 K/uL  Comprehensive metabolic panel  Result Value Ref Range   Sodium 134 (L) 135 - 145 mmol/L   Potassium 3.4 (L) 3.5 - 5.1 mmol/L   Chloride 94 (L) 101 - 111 mmol/L   CO2 23 22 - 32 mmol/L   Glucose, Bld 108 (H) 65 - 99 mg/dL   BUN 15 6 - 20 mg/dL   Creatinine, Ser 0.85 0.44 - 1.00 mg/dL   Calcium 8.3 (L) 8.9 - 10.3 mg/dL   Total Protein 7.4 6.5 - 8.1 g/dL   Albumin 2.0 (L) 3.5 - 5.0 g/dL   AST 24 15 - 41 U/L   ALT 25 14 - 54 U/L   Alkaline Phosphatase 141 (H) 38 - 126 U/L   Total Bilirubin 0.7 0.3 - 1.2 mg/dL   GFR calc non Af Amer >60 >60 mL/min   GFR calc Af Amer >60 >60 mL/min   Anion gap 17 (H) 5 - 15  Lipase, blood  Result Value Ref Range   Lipase 17 11 - 51 U/L  TSH  Result Value Ref Range   TSH 0.121 (L) 0.350 - 4.500 uIU/mL   Hypokalemia -- replace with 40 mEq of KCl Mild AG elevation with normal bicarb and mild hypochloremia -- likely from dehydration/poor po intake/GI losses -- IVFs given Low TSH -- will need further work up outpatient, may be contributing to syptoms CBC indicative of possible inflammatory process with mild elevation of WBCs and reactive thrombocytosis. C. Diff studies sent  Assessment and Plan  Assessment: 1. Chronic diarrhea   2. Low TSH level   3. Hypokalemia, gastrointestinal losses   4. Nausea and vomiting, intractability of vomiting  not specified, unspecified vomiting type     Plan: --Dicussed with patient follow up plan with PCP and GI referral d/t 80-monthhistory of diarrhea. Likely inflammatory process given CBC.  --Discussed follow up with with PCP on thyroid function as well, which may be causing or contributing symptoms. Copy of lab results given to patient to give to patient. --Rx for additional K supplement given ongoing diarrhea  --Discharge home in stable condition.    Degele, JJenne Pane MD 07/04/2017  ADDENDUM: At time of discharge, vital signs recheck and and Temp 101. Normal BP.  Patient re-examined, abdomen remains soft and unchanged form prior. No other focal signs on exam.   Discussed with Dr. SNehemiah Settle Will treat fever with tylenol and give return precautions. Still suspect inflammatory process given chronic diarrhea with no new symptoms, possibly superimposed viral infection. Again emphasized importance of close follow up, and if worsening symptoms or new symptoms to go to ED.  JAlmyra FreeP. Degele, MD OB Fellow 5:33 AM

## 2017-07-04 NOTE — MAU Note (Signed)
Pt reports generalized abdominal pain, vomiting and diarrhea for 1 month. States she has tried zofran, oxycodone and gabapentin for pain-nothing helps. Pt is 7 week PP.

## 2017-07-05 DIAGNOSIS — R195 Other fecal abnormalities: Secondary | ICD-10-CM

## 2017-07-05 DIAGNOSIS — D500 Iron deficiency anemia secondary to blood loss (chronic): Secondary | ICD-10-CM

## 2017-07-05 DIAGNOSIS — K648 Other hemorrhoids: Secondary | ICD-10-CM

## 2017-07-05 DIAGNOSIS — D509 Iron deficiency anemia, unspecified: Secondary | ICD-10-CM

## 2017-07-05 DIAGNOSIS — K51011 Ulcerative (chronic) pancolitis with rectal bleeding: Secondary | ICD-10-CM

## 2017-07-05 LAB — CBC
HCT: 25.3 % — ABNORMAL LOW (ref 36.0–46.0)
Hemoglobin: 7.5 g/dL — ABNORMAL LOW (ref 12.0–15.0)
MCH: 23.6 pg — ABNORMAL LOW (ref 26.0–34.0)
MCHC: 29.6 g/dL — ABNORMAL LOW (ref 30.0–36.0)
MCV: 79.6 fL (ref 78.0–100.0)
Platelets: 677 10*3/uL — ABNORMAL HIGH (ref 150–400)
RBC: 3.18 MIL/uL — ABNORMAL LOW (ref 3.87–5.11)
RDW: 15.2 % (ref 11.5–15.5)
WBC: 10.7 10*3/uL — ABNORMAL HIGH (ref 4.0–10.5)

## 2017-07-05 LAB — RAPID URINE DRUG SCREEN, HOSP PERFORMED
Amphetamines: NOT DETECTED
Barbiturates: NOT DETECTED
Benzodiazepines: NOT DETECTED
Cocaine: NOT DETECTED
Opiates: POSITIVE — AB
Tetrahydrocannabinol: NOT DETECTED

## 2017-07-05 LAB — COMPREHENSIVE METABOLIC PANEL
ALT: 22 U/L (ref 14–54)
AST: 17 U/L (ref 15–41)
Albumin: 1.7 g/dL — ABNORMAL LOW (ref 3.5–5.0)
Alkaline Phosphatase: 114 U/L (ref 38–126)
Anion gap: 10 (ref 5–15)
BUN: 9 mg/dL (ref 6–20)
CO2: 26 mmol/L (ref 22–32)
Calcium: 8.2 mg/dL — ABNORMAL LOW (ref 8.9–10.3)
Chloride: 104 mmol/L (ref 101–111)
Creatinine, Ser: 0.76 mg/dL (ref 0.44–1.00)
GFR calc Af Amer: >60 mL/min (ref >60)
GFR calc non Af Amer: >60 mL/min (ref >60)
Glucose, Bld: 100 mg/dL — ABNORMAL HIGH (ref 65–99)
Potassium: 3.3 mmol/L — ABNORMAL LOW (ref 3.5–5.1)
Sodium: 140 mmol/L (ref 135–145)
Total Bilirubin: 0.5 mg/dL (ref 0.3–1.2)
Total Protein: 6.1 g/dL — ABNORMAL LOW (ref 6.5–8.1)

## 2017-07-05 LAB — GASTROINTESTINAL PANEL BY PCR, STOOL (REPLACES STOOL CULTURE)

## 2017-07-05 LAB — OCCULT BLOOD X 1 CARD TO LAB, STOOL: Fecal Occult Bld: POSITIVE — AB

## 2017-07-05 LAB — HERPES SIMPLEX VIRUS(HSV) DNA BY PCR
HSV 1 DNA: NEGATIVE
HSV 2 DNA: NEGATIVE

## 2017-07-05 LAB — URINE CULTURE

## 2017-07-05 LAB — PREALBUMIN: Prealbumin: 11.6 mg/dL — ABNORMAL LOW (ref 18–38)

## 2017-07-05 MED ORDER — LIDOCAINE HCL 2 % EX GEL
1.0000 "application " | Freq: Once | CUTANEOUS | Status: AC
  Administered 2017-07-05: 1 via TOPICAL
  Filled 2017-07-05: qty 5

## 2017-07-05 MED ORDER — OXYCODONE-ACETAMINOPHEN 5-325 MG PO TABS
1.0000 | ORAL_TABLET | ORAL | Status: DC | PRN
  Administered 2017-07-05 – 2017-07-09 (×19): 1 via ORAL
  Filled 2017-07-05 (×19): qty 1

## 2017-07-05 MED ORDER — DULOXETINE HCL 30 MG PO CPEP
30.0000 mg | ORAL_CAPSULE | Freq: Every day | ORAL | Status: DC
  Administered 2017-07-06 – 2017-07-12 (×6): 30 mg via ORAL
  Filled 2017-07-05 (×7): qty 1

## 2017-07-05 MED ORDER — POTASSIUM CHLORIDE CRYS ER 20 MEQ PO TBCR
20.0000 meq | EXTENDED_RELEASE_TABLET | Freq: Two times a day (BID) | ORAL | Status: AC
  Administered 2017-07-05 (×2): 20 meq via ORAL
  Filled 2017-07-05 (×2): qty 1

## 2017-07-05 MED ORDER — DIPHENHYDRAMINE HCL 25 MG PO CAPS
25.0000 mg | ORAL_CAPSULE | Freq: Once | ORAL | Status: AC
  Administered 2017-07-05: 25 mg via ORAL
  Filled 2017-07-05: qty 1

## 2017-07-05 NOTE — Progress Notes (Signed)
Patient c/o pain and burning in vaginal area. Requesting lidocaine jelly. Paged on-call FMTS physician. Awaiting orders. Will continue to monitor.

## 2017-07-05 NOTE — Progress Notes (Signed)
Patient is stating Dilaudid makes her feel funny and requesting alternate pain medication. On-call FMTS physician paged. Awaiting new orders. Will continue to monitor.

## 2017-07-05 NOTE — Progress Notes (Signed)
Patient refused zosyn this morning. She states it causes increased burning to her vaginal area. On-call FMTS physician notified. Will continue to monitor.

## 2017-07-05 NOTE — Progress Notes (Signed)
Daily Rounding Note  07/05/2017, 8:29 AM  LOS: 1 day   SUBJECTIVE:   Chief complaint: diarrhea, some blood PR.  Abdominal pain. Got 8 mg Morphine yesterday, 4 mg so far today.      Says pain is better, multiple watery but not bloody stools  OBJECTIVE:         Vital signs in last 24 hours:    Temp:  [98 F (36.7 C)-99.2 F (37.3 C)] 99.1 F (37.3 C) (10/11 0543) Pulse Rate:  [102-121] 121 (10/11 0543) Resp:  [11-30] 18 (10/11 0543) BP: (103-126)/(65-92) 116/68 (10/11 0543) SpO2:  [100 %] 100 % (10/11 0543) Last BM Date: 07/04/17 There were no vitals filed for this visit. General: looks better, not uncomfortable, not toxic   Heart: Regular, tachy.   Chest: clear bil.  Abdomen: soft, NT, BS hypoactive.    Extremities: no CCE Neuro/Psych:  Oriented x 3.  Less depressed and less flat affect but still seems depressed.   Intake/Output from previous day: 10/10 0701 - 10/11 0700 In: 2082.3 [I.V.:1918.3; IV Piggyback:164] Out: 1 [Stool:1]  Intake/Output this shift: No intake/output data recorded.  Lab Results:  Recent Labs  07/04/17 0823 07/04/17 2055 07/05/17 0732  WBC 11.0* 9.1 10.7*  HGB 9.0* 7.7* 7.5*  HCT 28.2* 25.6* 25.3*  PLT 675* 677* 677*   BMET  Recent Labs  07/04/17 0144 07/04/17 0823 07/04/17 2055  NA 134* 135  --   K 3.4* 3.8  --   CL 94* 97*  --   CO2 23 24  --   GLUCOSE 108* 102*  --   BUN 15 13  --   CREATININE 0.85 0.89 0.72  CALCIUM 8.3* 8.3*  --    LFT  Recent Labs  07/04/17 0144 07/04/17 0823  PROT 7.4 6.7  ALBUMIN 2.0* 1.8*  AST 24 19  ALT 25 24  ALKPHOS 141* 128*  BILITOT 0.7 0.7   PT/INR No results for input(s): LABPROT, INR in the last 72 hours. Hepatitis Panel No results for input(s): HEPBSAG, HCVAB, HEPAIGM, HEPBIGM in the last 72 hours.  Studies/Results: Dg Chest 2 View  Result Date: 07/04/2017 CLINICAL DATA:  Fever and chest and abdominal pain EXAM:  CHEST  2 VIEW COMPARISON:  Chest x-ray dated October 10, 2016 FINDINGS: The lungs are well-expanded and clear. The heart and pulmonary vascularity are normal. The mediastinum is normal in width. There is no pleural effusion. The gas pattern in the upper abdomen is normal. The bony thorax is unremarkable. IMPRESSION: There is no active cardiopulmonary disease. Electronically Signed   By: David  Martinique M.D.   On: 07/04/2017 08:57   Ct Abdomen Pelvis W Contrast  Result Date: 07/04/2017 CLINICAL DATA:  Lower abdominal pain and diarrhea for 1 month. EXAM: CT ABDOMEN AND PELVIS WITH CONTRAST TECHNIQUE: Multidetector CT imaging of the abdomen and pelvis was performed using the standard protocol following bolus administration of intravenous contrast. CONTRAST:  167m ISOVUE-300 IOPAMIDOL (ISOVUE-300) INJECTION 61% COMPARISON:  05/19/2011 CT abdomen/pelvis. FINDINGS: Lower chest: No significant pulmonary nodules or acute consolidative airspace disease. Hepatobiliary: Normal liver with no liver mass. Cholelithiasis, with no gallbladder wall thickening or pericholecystic fluid. No biliary ductal dilatation. Pancreas: Normal, with no mass or duct dilation. Spleen: Normal size. No mass. Adrenals/Urinary Tract: Normal adrenals. Normal kidneys with no hydronephrosis and no renal mass. Normal bladder. Stomach/Bowel: Grossly normal stomach. Normal caliber small bowel with no small bowel wall thickening. Appendix is within normal  limits. There is diffuse mild-to-moderate colorectal wall thickening with associated pericolonic fat stranding without skip lesions. Vascular/Lymphatic: Normal caliber abdominal aorta. Patent portal, splenic, hepatic and renal veins. Mild right mesenteric adenopathy measuring up to 1.1 cm (series 3/ image 54), new. Reproductive: Grossly normal uterus.  No adnexal mass. Other: No pneumoperitoneum, ascites or focal fluid collection. Musculoskeletal: No aggressive appearing focal osseous lesions.  IMPRESSION: 1. Nonspecific infectious or inflammatory pancolitis, with the differential including inflammatory bowel disease. No obstruction, perforation, fistula or abscess. 2. Nonspecific mild right mesenteric adenopathy, probably reactive. 3. Cholelithiasis. No CT findings of acute cholecystitis. No biliary ductal dilatation. Electronically Signed   By: Ilona Sorrel M.D.   On: 07/04/2017 10:16   Scheduled Meds: Continuous Infusions: . acyclovir Stopped (07/05/17 0317)  . dextrose 5 % and 0.9% NaCl 100 mL/hr at 07/04/17 2139  . piperacillin-tazobactam (ZOSYN)  IV 3.375 g (07/05/17 0820)   PRN Meds:.morphine injection, ondansetron **OR** ondansetron (ZOFRAN) IV   ASSESMENT:   *  Pan colitis with diarrhea and blood PR.  Ct confirms colitis, chronic large hemorrhoids.  Recent cocaine relapse.   R/o ischemic vs non-cdiff infectious vs IBD.  Bleeding could also be from her hemorrhoids.  Elevated CRP at 22 and ESR at 99.  Marland Kitchen  Pending: fecal calprotectin, GI path panel.    *  Microcytic anemia.  Low iron and iron sats.  Folate, B12, Ferritin not low.    *  Major depression.  Substance abuse.  tox screen not repeated since 10/4 when + for cocaine, opiates.       *  Bacteruria.  Urine clx pending.  On zosyn day 2.    *  Low TSH, normal free T4.   *  Low albumin, suspect malnutrition.    *  HSV.  Acyclovir in place.     PLAN   *  Pre albumin level in AM.    *  ? Flex sig vs colonoscopy tomorrow?     Azucena Freed  07/05/2017, 8:29 AM Pager: [phone]   Attending physician's note   I have taken an interval history, reviewed the chart and examined the patient. I agree with the Advanced Practitioner's note, impression and recommendations.  Await urine tox screen and GI pathogen panel Microcytic anemia multifactorial, patient is 7 weeks postpartum Suspicion is high for infectious or ischemic colitis. CRP/ESR non specific will be elevated even in these scenarios Cannot completely  exclude IBD based on presentation, will consider colonoscopy if patient continues to be symptomatic and has negative tox screen for cocaine and negative GI pathogen panel Advance diet slowly as tolerated starting with clears Continue Supportive care  K Denzil Magnuson, MD [phone] Mon-Fri 8a-5p [phone] after 5p, weekends, holidays

## 2017-07-05 NOTE — Progress Notes (Signed)
FPTS Interim Progress Note  O: BP 107/61 (BP Location: Left Arm)   Pulse (!) 103   Temp 98 F (36.7 C)   Resp 18   LMP  (LMP Unknown)   SpO2 99%     A/P: After refusing acyclovir this AM and then agreeing to take it upon discussion with me, the patient's nurse paged me to let me know Latoya [name] was again refusing acyclovir because she said it caused pain at the site of her lesions.  Acyclovir is being discontinued due to patient refusal.  Latoya Sires, DO 07/05/2017, 5:17 PM PGY-1, [place] [facility] pager [phone]
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
## 2017-07-05 NOTE — Progress Notes (Signed)
Urine mixed with stool. Unable to collect UDS this shift. Will continue to monitor and attempt urine collection.
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
## 2017-07-06 DIAGNOSIS — R1084 Generalized abdominal pain: Secondary | ICD-10-CM

## 2017-07-06 DIAGNOSIS — K529 Noninfective gastroenteritis and colitis, unspecified: Secondary | ICD-10-CM

## 2017-07-06 DIAGNOSIS — F53 Postpartum depression: Secondary | ICD-10-CM

## 2017-07-06 LAB — COMPREHENSIVE METABOLIC PANEL
ALT: 29 U/L (ref 14–54)
AST: 24 U/L (ref 15–41)
Albumin: 1.8 g/dL — ABNORMAL LOW (ref 3.5–5.0)
Alkaline Phosphatase: 116 U/L (ref 38–126)
Anion gap: 7 (ref 5–15)
BUN: 6 mg/dL (ref 6–20)
CO2: 27 mmol/L (ref 22–32)
Calcium: 8.2 mg/dL — ABNORMAL LOW (ref 8.9–10.3)
Chloride: 108 mmol/L (ref 101–111)
Creatinine, Ser: 0.78 mg/dL (ref 0.44–1.00)
GFR calc Af Amer: >60 mL/min (ref >60)
GFR calc non Af Amer: >60 mL/min (ref >60)
Glucose, Bld: 93 mg/dL (ref 65–99)
Potassium: 3.5 mmol/L (ref 3.5–5.1)
Sodium: 142 mmol/L (ref 135–145)
Total Bilirubin: 0.4 mg/dL (ref 0.3–1.2)
Total Protein: 6 g/dL — ABNORMAL LOW (ref 6.5–8.1)

## 2017-07-06 LAB — CBC
HCT: 23.5 % — ABNORMAL LOW (ref 36.0–46.0)
Hemoglobin: 7 g/dL — ABNORMAL LOW (ref 12.0–15.0)
MCH: 24.2 pg — ABNORMAL LOW (ref 26.0–34.0)
MCHC: 29.8 g/dL — ABNORMAL LOW (ref 30.0–36.0)
MCV: 81.3 fL (ref 78.0–100.0)
Platelets: 611 10*3/uL — ABNORMAL HIGH (ref 150–400)
RBC: 2.89 MIL/uL — ABNORMAL LOW (ref 3.87–5.11)
RDW: 15.6 % — ABNORMAL HIGH (ref 11.5–15.5)
WBC: 6.3 10*3/uL (ref 4.0–10.5)

## 2017-07-06 LAB — PREPARE RBC (CROSSMATCH)

## 2017-07-06 LAB — HEMOGLOBIN AND HEMATOCRIT, BLOOD
HCT: 26.5 % — ABNORMAL LOW (ref 36.0–46.0)
Hemoglobin: 7.9 g/dL — ABNORMAL LOW (ref 12.0–15.0)

## 2017-07-06 LAB — HSV(HERPES SIMPLEX VRS) I + II AB-IGG
HSV 1 Glycoprotein G Ab, IgG: 7.13 index — ABNORMAL HIGH (ref 0.00–0.90)
HSV 2 Glycoprotein G Ab, IgG: <0.91 index (ref 0.00–0.90)

## 2017-07-06 LAB — ABO/RH: ABO/RH(D): O POS

## 2017-07-06 MED ORDER — PEG-KCL-NACL-NASULF-NA ASC-C 100 G PO SOLR: 1.0000 | Freq: Once | ORAL | Status: DC

## 2017-07-06 MED ORDER — METOCLOPRAMIDE HCL 5 MG/ML IJ SOLN
10.0000 mg | Freq: Once | INTRAMUSCULAR | Status: AC
  Administered 2017-07-06: 10 mg via INTRAVENOUS
  Filled 2017-07-06: qty 2

## 2017-07-06 MED ORDER — METOCLOPRAMIDE HCL 5 MG/ML IJ SOLN
10.0000 mg | Freq: Once | INTRAMUSCULAR | Status: AC
  Administered 2017-07-07: 10 mg via INTRAVENOUS
  Filled 2017-07-06: qty 2

## 2017-07-06 MED ORDER — PEG-KCL-NACL-NASULF-NA ASC-C 100 G PO SOLR
0.5000 | Freq: Once | ORAL | Status: AC
  Administered 2017-07-07: 100 g via ORAL
  Filled 2017-07-06: qty 1

## 2017-07-06 MED ORDER — GI COCKTAIL ~~LOC~~
30.0000 mL | Freq: Once | ORAL | Status: AC
  Administered 2017-07-06: 30 mL via ORAL
  Filled 2017-07-06: qty 30

## 2017-07-06 MED ORDER — PEG-KCL-NACL-NASULF-NA ASC-C 100 G PO SOLR
0.5000 | Freq: Once | ORAL | Status: AC
  Administered 2017-07-06: 100 g via ORAL
  Filled 2017-07-06: qty 1

## 2017-07-06 MED ORDER — MORPHINE SULFATE (PF) 2 MG/ML IV SOLN
2.0000 mg | Freq: Once | INTRAVENOUS | Status: AC
  Administered 2017-07-06: 2 mg via INTRAVENOUS
  Filled 2017-07-06: qty 1

## 2017-07-06 NOTE — Progress Notes (Signed)
Pt refused half of 2200 IV zosyn dose-antibiotic stopped and continuous fluids resumed. Will ctm.

## 2017-07-06 NOTE — Progress Notes (Signed)
          Daily Rounding Note  07/06/2017, 11:04 AM  LOS: 2 days   SUBJECTIVE:   Chief complaint: still with watery green diarrhea.   Still with 8 to 10 stools today. Some nausea, emesis of green liquid.  .           OBJECTIVE:         Vital signs in last 24 hours:    Temp:  [98 F (36.7 C)-98.9 F (37.2 C)] 98.2 F (36.8 C) (10/12 0613) Pulse Rate:  [88-103] 88 (10/12 0613) Resp:  [18-20] 20 (10/11 2156) BP: (107-114)/(58-64) 114/58 (10/12 0613) SpO2:  [98 %-99 %] 98 % (10/12 4825) Last BM Date: 07/05/17 There were no vitals filed for this visit. General: better mood.  Does not look ill   Heart: RRR Chest: clear  Abdomen: soft, NT, ND.  Active BS  Extremities: no CCE Neuro/Psych:  More engaged and alert.    Intake/Output from previous day: 10/11 0701 - 10/12 0700 In: 980 [P.O.:980] Out: -   Intake/Output this shift: No intake/output data recorded.  Lab Results:  Recent Labs  07/04/17 2055 07/05/17 0732 07/06/17 0657  WBC 9.1 10.7* 6.3  HGB 7.7* 7.5* 7.0*  HCT 25.6* 25.3* 23.5*  PLT 677* 677* 611*   BMET  Recent Labs  07/04/17 0823 07/04/17 2055 07/05/17 0732 07/06/17 0657  NA 135  --  140 142  K 3.8  --  3.3* 3.5  CL 97*  --  104 108  CO2 24  --  26 27  GLUCOSE 102*  --  100* 93  BUN 13  --  9 6  CREATININE 0.89 0.72 0.76 0.78  CALCIUM 8.3*  --  8.2* 8.2*   LFT  Recent Labs  07/04/17 0823 07/05/17 0732 07/06/17 0657  PROT 6.7 6.1* 6.0*  ALBUMIN 1.8* 1.7* 1.8*  AST 19 17 24   ALT 24 22 29   ALKPHOS 128* 114 116  BILITOT 0.7 0.5 0.4   PT/INR No results for input(s): LABPROT, INR in the last 72 hours. Hepatitis Panel No results for input(s): HEPBSAG, HCVAB, HEPAIGM, HEPBIGM in the last 72 hours.  Studies/Results: No results found.  ASSESMENT:   *  Pan colitis.  abd pain and blood with BM.   R/o ischemic vs non-cdiff infectious vs IBD.  Bleeding could also be from her  hemorrhoids  *  Normocytic anemia.  Anemia dates to 2011.  Hgb drop 4.4 grams in 12 days.  Low iron, low TIBC, low saturation.  Folate, ferritin ok.  b12 ordered.       PLAN   *  Colonoscopy tomorrow.  Hopefully will be able to tolerate prep, if not, maybe will  Turn into  A flex sig.      Azucena Freed  07/06/2017, 11:04 AM Pager: [phone]   Attending physician's note   I have taken an interval history, reviewed the chart and examined the patient. I agree with the Advanced Practitioner's note, impression and recommendations.  Urine Tox screen negative for cocaine. GI pathogen panel and C.diff negative Persistent diarrhea and abdominal pain, will proceed with colonoscopy and biopsies to evaluate for possible IBD The risks and benefits as well as alternatives of endoscopic procedure(s) have been discussed and reviewed. All questions answered. The patient agrees to proceed.  Damaris Hippo, MD [phone] Mon-Fri 8a-5p [phone] after 5p, weekends, holidays

## 2017-07-06 NOTE — Discharge Summary (Signed)
[facility] Leaving AMA Summary  Patient name: Latoya [name] Medical record number: [id number] Date of birth: 1991-02-24 Age: 26 y.o. Gender: female Date of Admission: 07/04/2017  Date of leaving AMA: 07/13/17 Admitting Physician: Lind Covert, MD  Primary Care Provider: Fanny Bien, MD Consultants: GI, Psych  Indication for Hospitalization: bloody diarrhea w/ abdominal pain  Leaving AMA Diagnoses/Problem List:  indeterminant inflammatory bowel disease, pancreatitis, post partum depression, history of substance abuse  Disposition: AMA  Leaving AMA Condition: unable to assess as she left without physician notification  Discharge Exam: unable to assess as she left without physician notification  Brief Hospital Course:  Patient presented with bloody diarrhea and abdominal pain.  GI was consulted and abdominal pain workup was performed.  After stool panel was neg for infectious causes a colonosocopy indicated indeterminate inflammatory bowel and steroid treatment was began with solumedrol with plans to transition to prednisone as tolerated.   Patient then developed increased abdominal pain inconsistent with prior presentation and an abdominal CT indicated pancreatitis with presence of gallstones although no dilatation of duct.  GI bleeding believed to be cause of anemia requiring 2u RBS transfusion.  Lipin panel was negative and patient was started NPO with plans to progress diet as tolerated. Patient had a flat affect and psychiatry was consulted prompting increase of patient's cymbalta.   On 10/19, patient and her mother expressed they were not happy with attempts to progress diet and medication to oral medications.  Patient was complaining of increased pain and symptoms prompted medical team to increase pain medication and order a HIDA scan.  During overnight shift, patient left AMA before physician team was notified.  Issues for Follow Up:   1. Colonoscopy pathology showed colitis with ulceration but no granulomas, diagnosis is indeterminate inflammatory bowel 2. Pancreatitis w/ normal lipid panel and presence of gallstones, likely gallstone pancreatitis.   Patient will need GI followup to discuss potential of cholecystecomty in future.  On 10/19 we had ordered HIDA scan out of concern with increased pain and potential murphy sign but patient left AMA prior to performing 3. Patient had anemia @7 .0 and was transfused with 2u.   Please followup on anemia. 4. Labs showed T4 WNL but low TSH, consider followup 5. Patient needs outpatient psych f/u for post partum depression.  Their cymbalta was increased to 66m and should be considered for increase to 670mon 10/25  Significant Procedures: colonscopy  Significant Labs and Imaging:   Recent Labs Lab 07/12/17 0401 07/12/17 2135 07/13/17 0653  WBC 11.9* 14.3* 13.7*  HGB 6.6* 9.0* 9.2*  HCT 22.6* 28.4* 29.3*  PLT 536* 442* 443*    Recent Labs Lab 07/09/17 0623 07/09/17 1035 07/10/17 0455 07/11/17 0703 07/12/17 0401 07/13/17 0653  NA 138 137 136  --  133* 134*  K 3.5 3.3* 4.4  --  3.9 4.2  CL 101 103 102  --  99* 96*  CO2 25 24 28   --  23 26  GLUCOSE 132* 133* 87  --  83 72  BUN 8 8 9   --  8 <5*  CREATININE 0.81 0.72 0.59 0.67 0.64 0.63  CALCIUM 8.7* 8.5* 8.3*  --  8.1* 8.4*  ALKPHOS  --  120  --   --   --   --   AST  --  18  --   --   --   --   ALT  --  32  --   --   --   --  ALBUMIN  --  2.2*  --   --   --   --     Dg Chest 2 View  Result Date: 07/04/2017 CLINICAL DATA:  Fever and chest and abdominal pain EXAM: CHEST  2 VIEW COMPARISON:  Chest x-ray dated October 10, 2016 FINDINGS: The lungs are well-expanded and clear. The heart and pulmonary vascularity are normal. The mediastinum is normal in width. There is no pleural effusion. The gas pattern in the upper abdomen is normal. The bony thorax is unremarkable. IMPRESSION: There is no active cardiopulmonary  disease. Electronically Signed   By: David  Martinique M.D.   On: 07/04/2017 08:57   Ct Abdomen Pelvis W Contrast  Result Date: 07/04/2017 CLINICAL DATA:  Lower abdominal pain and diarrhea for 1 month. EXAM: CT ABDOMEN AND PELVIS WITH CONTRAST TECHNIQUE: Multidetector CT imaging of the abdomen and pelvis was performed using the standard protocol following bolus administration of intravenous contrast. CONTRAST:  149m ISOVUE-300 IOPAMIDOL (ISOVUE-300) INJECTION 61% COMPARISON:  05/19/2011 CT abdomen/pelvis. FINDINGS: Lower chest: No significant pulmonary nodules or acute consolidative airspace disease. Hepatobiliary: Normal liver with no liver mass. Cholelithiasis, with no gallbladder wall thickening or pericholecystic fluid. No biliary ductal dilatation. Pancreas: Normal, with no mass or duct dilation. Spleen: Normal size. No mass. Adrenals/Urinary Tract: Normal adrenals. Normal kidneys with no hydronephrosis and no renal mass. Normal bladder. Stomach/Bowel: Grossly normal stomach. Normal caliber small bowel with no small bowel wall thickening. Appendix is within normal limits. There is diffuse mild-to-moderate colorectal wall thickening with associated pericolonic fat stranding without skip lesions. Vascular/Lymphatic: Normal caliber abdominal aorta. Patent portal, splenic, hepatic and renal veins. Mild right mesenteric adenopathy measuring up to 1.1 cm (series 3/ image 54), new. Reproductive: Grossly normal uterus.  No adnexal mass. Other: No pneumoperitoneum, ascites or focal fluid collection. Musculoskeletal: No aggressive appearing focal osseous lesions. IMPRESSION: 1. Nonspecific infectious or inflammatory pancolitis, with the differential including inflammatory bowel disease. No obstruction, perforation, fistula or abscess. 2. Nonspecific mild right mesenteric adenopathy, probably reactive. 3. Cholelithiasis. No CT findings of acute cholecystitis. No biliary ductal dilatation. Electronically Signed   By:  JIlona SorrelM.D.   On: 07/04/2017 10:16    Results/Tests Pending at Time of leaving AMA: Patient had HIDA scan to be performed that she left before we could do   Medications at time of leaving AMA:  Allergies as of 07/13/2017   No Known Allergies     Medication List    ASK your doctor about these medications   dicyclomine 20 MG tablet Commonly known as:  BENTYL Take 1 tablet (20 mg total) by mouth 2 (two) times daily.   DULoxetine 30 MG capsule Commonly known as:  CYMBALTA Take 1 capsule (30 mg total) by mouth daily.   gabapentin 300 MG capsule Commonly known as:  NEURONTIN Take 1 capsule (300 mg total) by mouth 3 (three) times daily.   hydrocortisone 25 MG suppository Commonly known as:  ANUSOL-HC Place 1 suppository (25 mg total) rectally 2 (two) times daily.   ketorolac 10 MG tablet Commonly known as:  TORADOL Take 1 tablet (10 mg total) by mouth every 6 (six) hours as needed.   lidocaine 2 % jelly Commonly known as:  XYLOCAINE Apply 1 application topically as needed.   loperamide 2 MG capsule Commonly known as:  IMODIUM Take 2 capsules (4 mg total) by mouth at bedtime.   naproxen 500 MG tablet Commonly known as:  NAPROSYN Take 1 tablet (500 mg total) by mouth 2 (  two) times daily.   nitroGLYCERIN 2 % ointment Commonly known as:  NITROGLYN Apply 0.5 inches topically 2 (two) times daily.   ondansetron 4 MG disintegrating tablet Commonly known as:  ZOFRAN ODT Take 1 tablet (4 mg total) by mouth every 8 (eight) hours as needed for nausea or vomiting.   oxyCODONE-acetaminophen 5-325 MG tablet Commonly known as:  PERCOCET/ROXICET Take 1 tablet by mouth every 4 (four) hours as needed for severe pain.   PARoxetine 25 MG 24 hr tablet Commonly known as:  PAXIL-CR Take 1 tablet (25 mg total) by mouth daily.   potassium chloride 10 MEQ tablet Commonly known as:  K-DUR Take 1 tablet (10 mEq total) by mouth 2 (two) times daily.   potassium chloride SA 20 MEQ  tablet Commonly known as:  K-DUR,KLOR-CON Take 2 tablets (40 mEq total) by mouth 2 (two) times daily.   promethazine 12.5 MG tablet Commonly known as:  PHENERGAN Take 1 tablet (12.5 mg total) by mouth every 6 (six) hours as needed for nausea or vomiting.   traMADol 50 MG tablet Commonly known as:  ULTRAM Take 1 tablet (50 mg total) by mouth every 6 (six) hours as needed.   valACYclovir 1000 MG tablet Commonly known as:  VALTREX Take 1 tablet (1,000 mg total) by mouth 2 (two) times daily. Ask about: Should I take this medication?       Patient left AMA so was unable to get Instructions:   Follow-Up Appointments:   Sherene Sires, DO 07/14/2017, 3:31 PM PGY-1, [facility]

## 2017-07-06 NOTE — Progress Notes (Signed)
Family Medicine Teaching Service Daily Progress Note Intern Pager: [phone]  Patient name: Latoya [name] Medical record number: [id number] Date of birth: 04-13-1991 Age: 26 y.o. Gender: female  Primary Care Provider: Fanny Bien, MD Consultants: GI Code Status: full  Pt Overview and Major Events to Date:  Latoya [name] is a 26 y.o. female presenting with abdominal pain . PMH is significant for HSV infection, substance abuse, depression, anxiety and anemia.   Assessment and Plan: Latoya [name] is a 27 y.o. female presenting with abdominal pain . PMH is significant for HSV infection, substance abuse, depression, anxiety and anemia.   Abdominal Pain/bloody diarrhea/Emesis: concerning for IBD, particularly UC given the duration and CT finding. She has abdominal pain and bloody diarrhea for about a month. She had multiple ED visits. She had leukocytosis over 2 weeks & gradually downtrending hemoglobin level. It is also typical age for UC. She is diffusely tender to palpation. She has no rebound tenderness. She has angular stomatitis likely from micronutrient deficiency. Infectious colitis is also on differential diagnosis. CT also shows cholelithiasis but consistent abdominal pain is not typical with cholelithiasis. She is about 7 weeks postpartum which makes endometriosis less likely. CMP with low albumin to 1.8 and borderline alkaline phosphatase. GI path (-), c. Diff (-), esr/crp elevated -Admit to med surg. Dr. Ardelia Mems -clear liquids as tolerated - fecal Calprotectin pending -GI will consider scope now that UDS for cocaine and GI panel are both (-) -Zosyn for possible infectious colitis.  -percocet for pain -Zofran for nausea. No QTC prolongation.  Depression/Anxiety: Concerning for postpartum depression. Cymbalta and Paxil listed in her medication list. She doesn't remember which one she takes. She says she took one of them 3 days ago. She has flat affect. She also had  history of substance use which complicates the matter. She denies SI/HI. -cymbalta 30  Subclinical hyperthyroidism: TSH low, 0.121, but free T4 is WNL on 07/04/2017 -Outpatient follow-up  Genital herpes:  -Continue acyclovir BID  ?UTI: Patient without urinary symptoms. UA likely due to dirty catch versus UTI. Regardless, she is covered by Zosyn -Follow up urine culture  Iron deficiency Anemia: Hemoglobin slowly down trending from 11.7 about 2 weeks ago to 7.0 -ordered 1u RBS transfusion -Trend CBC  Hypoalbuminemia- 1.7 down from 3.7 end of Sept '18. -discuss replenishement  Hypokalemia- -discuss oral vs IV replenishment (+multivitamin)  Substance use: UDS neg for anything but opioids  Chest pain: resolved  FEN/GI:  -Nothing by mouth -IV fluid as above  Prophylaxis: SCD pending GI bleed and GI input.  Disposition: Admit to Med Surg  Subjective:  Patient demanded transition to percocet instead of morphine overnight and night coverage made that transition for her She also refused zosyn despite attempts to persude her to accept the medication  Otherwise, tired and disinterested in discussing anything with me this morning but she did say her pain was slightly improved and she thought diarrhea seemed to be resolving minimally   Objective: Temp:  [98 F (36.7 C)-98.9 F (37.2 C)] 98.9 F (37.2 C) (10/11 2156) Pulse Rate:  [94-103] 94 (10/11 2156) Resp:  [18-20] 20 (10/11 2156) BP: (107-111)/(61-64) 111/64 (10/11 2156) SpO2:  [99 %] 99 % (10/11 2156) Physical Exam: General: Uncomfortable in bed. Non-toxic appearing ENTM: Oropharynx clear and moist. Patient has visible angular stomatitis  Neck: Trachea is midline, no lymphadenopathy  Cardiovascular: Regular rhythm. Slightly tachycardic.  Potential murmur which might be referred breathing sounds 1/6, patient was not cooperating with exam  Respiratory: CTAB, no visible IWB. No wheezing or crackles Gastrointestinal:  Soft, BS present, no distension. Tender to palpation diffusely. No rebound tenderness MSK: DP pulses strong. No LE edema Derm: Warm and dry. Neuro: Fluent speech. Alert, no focal neuro deficits Psych: Responds to questions appropriately but flat affect  Laboratory:  Recent Labs Lab 07/04/17 0823 07/04/17 2055 07/05/17 0732  WBC 11.0* 9.1 10.7*  HGB 9.0* 7.7* 7.5*  HCT 28.2* 25.6* 25.3*  PLT 675* 677* 677*    Recent Labs Lab 07/04/17 0144 07/04/17 0823 07/04/17 2055 07/05/17 0732  NA 134* 135  --  140  K 3.4* 3.8  --  3.3*  CL 94* 97*  --  104  CO2 23 24  --  26  BUN 15 13  --  9  CREATININE 0.85 0.89 0.72 0.76  CALCIUM 8.3* 8.3*  --  8.2*  PROT 7.4 6.7  --  6.1*  BILITOT 0.7 0.7  --  0.5  ALKPHOS 141* 128*  --  114  ALT 25 24  --  22  AST 24 19  --  17  GLUCOSE 108* 102*  --  100*      Imaging/Diagnostic Tests: Dg Chest 2 View  Result Date: 07/04/2017 CLINICAL DATA:  Fever and chest and abdominal pain EXAM: CHEST  2 VIEW COMPARISON:  Chest x-ray dated October 10, 2016 FINDINGS: The lungs are well-expanded and clear. The heart and pulmonary vascularity are normal. The mediastinum is normal in width. There is no pleural effusion. The gas pattern in the upper abdomen is normal. The bony thorax is unremarkable. IMPRESSION: There is no active cardiopulmonary disease. Electronically Signed   By: David  Martinique M.D.   On: 07/04/2017 08:57   Ct Abdomen Pelvis W Contrast  Result Date: 07/04/2017 CLINICAL DATA:  Lower abdominal pain and diarrhea for 1 month. EXAM: CT ABDOMEN AND PELVIS WITH CONTRAST TECHNIQUE: Multidetector CT imaging of the abdomen and pelvis was performed using the standard protocol following bolus administration of intravenous contrast. CONTRAST:  132m ISOVUE-300 IOPAMIDOL (ISOVUE-300) INJECTION 61% COMPARISON:  05/19/2011 CT abdomen/pelvis. FINDINGS: Lower chest: No significant pulmonary nodules or acute consolidative airspace disease. Hepatobiliary:  Normal liver with no liver mass. Cholelithiasis, with no gallbladder wall thickening or pericholecystic fluid. No biliary ductal dilatation. Pancreas: Normal, with no mass or duct dilation. Spleen: Normal size. No mass. Adrenals/Urinary Tract: Normal adrenals. Normal kidneys with no hydronephrosis and no renal mass. Normal bladder. Stomach/Bowel: Grossly normal stomach. Normal caliber small bowel with no small bowel wall thickening. Appendix is within normal limits. There is diffuse mild-to-moderate colorectal wall thickening with associated pericolonic fat stranding without skip lesions. Vascular/Lymphatic: Normal caliber abdominal aorta. Patent portal, splenic, hepatic and renal veins. Mild right mesenteric adenopathy measuring up to 1.1 cm (series 3/ image 54), new. Reproductive: Grossly normal uterus.  No adnexal mass. Other: No pneumoperitoneum, ascites or focal fluid collection. Musculoskeletal: No aggressive appearing focal osseous lesions. IMPRESSION: 1. Nonspecific infectious or inflammatory pancolitis, with the differential including inflammatory bowel disease. No obstruction, perforation, fistula or abscess. 2. Nonspecific mild right mesenteric adenopathy, probably reactive. 3. Cholelithiasis. No CT findings of acute cholecystitis. No biliary ductal dilatation. Electronically Signed   By: JIlona SorrelM.D.   On: 07/04/2017 10:16     BSherene Sires DO 07/06/2017, 6:05 AM PGY-1, [place]ever[place] HillsIntern pager: [phone] text pages welcome

## 2017-07-07 ENCOUNTER — Encounter (HOSPITAL_COMMUNITY)
Admission: EM | Discharge status: Against Medical Advice | Payer: Self-pay | Source: Home / Self Care | Attending: Family Medicine

## 2017-07-07 ENCOUNTER — Inpatient Hospital Stay (HOSPITAL_COMMUNITY): Payer: Medicaid Other | Admitting: Anesthesiology

## 2017-07-07 ENCOUNTER — Encounter (HOSPITAL_COMMUNITY): Payer: Self-pay | Admitting: *Deleted

## 2017-07-07 HISTORY — PX: COLONOSCOPY: SHX5424

## 2017-07-07 LAB — CBC
HCT: 25.8 % — ABNORMAL LOW (ref 36.0–46.0)
Hemoglobin: 7.7 g/dL — ABNORMAL LOW (ref 12.0–15.0)
MCH: 24.2 pg — ABNORMAL LOW (ref 26.0–34.0)
MCHC: 29.8 g/dL — ABNORMAL LOW (ref 30.0–36.0)
MCV: 81.1 fL (ref 78.0–100.0)
Platelets: 707 10*3/uL — ABNORMAL HIGH (ref 150–400)
RBC: 3.18 MIL/uL — ABNORMAL LOW (ref 3.87–5.11)
RDW: 15.3 % (ref 11.5–15.5)
WBC: 7.8 10*3/uL (ref 4.0–10.5)

## 2017-07-07 LAB — BASIC METABOLIC PANEL
Anion gap: 10 (ref 5–15)
BUN: 6 mg/dL (ref 6–20)
CO2: 27 mmol/L (ref 22–32)
Calcium: 8.3 mg/dL — ABNORMAL LOW (ref 8.9–10.3)
Chloride: 103 mmol/L (ref 101–111)
Creatinine, Ser: 0.61 mg/dL (ref 0.44–1.00)
GFR calc Af Amer: >60 mL/min (ref >60)
GFR calc non Af Amer: >60 mL/min (ref >60)
Glucose, Bld: 85 mg/dL (ref 65–99)
Potassium: 3.3 mmol/L — ABNORMAL LOW (ref 3.5–5.1)
Sodium: 140 mmol/L (ref 135–145)

## 2017-07-07 LAB — BPAM RBC
Blood Product Expiration Date: 201810192359
ISSUE DATE / TIME: 201810121628
Unit Type and Rh: 5100

## 2017-07-07 LAB — RETICULOCYTES
RBC.: 3.18 MIL/uL — ABNORMAL LOW (ref 3.87–5.11)
Retic Count, Absolute: 41.3 10*3/uL (ref 19.0–186.0)
Retic Ct Pct: 1.3 % (ref 0.4–3.1)

## 2017-07-07 LAB — TYPE AND SCREEN
ABO/RH(D): O POS
Antibody Screen: NEGATIVE
Unit division: 0

## 2017-07-07 LAB — HSV(HERPES SIMPLEX VRS) I + II AB-IGM: HSVI/II Comb IgM: <0.91 Ratio (ref 0.00–0.90)

## 2017-07-07 LAB — VITAMIN B12: Vitamin B-12: 3133 pg/mL — ABNORMAL HIGH (ref 180–914)

## 2017-07-07 LAB — TROPONIN I: Troponin I: <0.03 ng/mL (ref <0.03)

## 2017-07-07 SURGERY — COLONOSCOPY
Anesthesia: Monitor Anesthesia Care

## 2017-07-07 MED ORDER — PROPOFOL 500 MG/50ML IV EMUL
INTRAVENOUS | Status: DC | PRN
  Administered 2017-07-07: 100 ug/kg/min via INTRAVENOUS

## 2017-07-07 MED ORDER — FENTANYL CITRATE (PF) 100 MCG/2ML IJ SOLN
INTRAMUSCULAR | Status: DC | PRN
  Administered 2017-07-07 (×2): 50 ug via INTRAVENOUS

## 2017-07-07 MED ORDER — ONDANSETRON HCL 4 MG/2ML IJ SOLN: 4.0000 mg | Freq: Once | INTRAMUSCULAR | Status: DC | PRN

## 2017-07-07 MED ORDER — LACTATED RINGERS IV SOLN: INTRAVENOUS | Status: DC

## 2017-07-07 MED ORDER — LIDOCAINE HCL 2 % EX GEL
1.0000 "application " | Freq: Once | CUTANEOUS | Status: AC
  Administered 2017-07-07: 1 via TOPICAL
  Filled 2017-07-07: qty 5

## 2017-07-07 MED ORDER — LACTATED RINGERS IV SOLN
INTRAVENOUS | Status: AC | PRN
  Administered 2017-07-07: 1000 mL via INTRAVENOUS
  Administered 2017-07-07: 09:00:00 via INTRAVENOUS

## 2017-07-07 MED ORDER — VALACYCLOVIR HCL 500 MG PO TABS: 1000.0000 mg | ORAL_TABLET | Freq: Every day | ORAL | Status: DC

## 2017-07-07 MED ORDER — BOOST / RESOURCE BREEZE PO LIQD
1.0000 | Freq: Three times a day (TID) | ORAL | Status: DC
  Administered 2017-07-07 – 2017-07-09 (×4): 1 via ORAL

## 2017-07-07 MED ORDER — METHYLPREDNISOLONE SODIUM SUCC 125 MG IJ SOLR
40.0000 mg | Freq: Two times a day (BID) | INTRAMUSCULAR | Status: DC
  Administered 2017-07-07 – 2017-07-10 (×7): 40 mg via INTRAVENOUS
  Filled 2017-07-07 (×7): qty 2

## 2017-07-07 MED ORDER — FENTANYL CITRATE (PF) 100 MCG/2ML IJ SOLN: 25.0000 ug | INTRAMUSCULAR | Status: DC | PRN

## 2017-07-07 MED ORDER — PROPOFOL 10 MG/ML IV BOLUS
INTRAVENOUS | Status: DC | PRN
  Administered 2017-07-07 (×4): 20 mg via INTRAVENOUS

## 2017-07-07 MED ORDER — CALCIUM CARBONATE ANTACID 500 MG PO CHEW
1.0000 | CHEWABLE_TABLET | Freq: Two times a day (BID) | ORAL | Status: DC | PRN
  Administered 2017-07-07 – 2017-07-08 (×2): 200 mg via ORAL
  Filled 2017-07-07 (×3): qty 1

## 2017-07-07 MED ORDER — ADULT MULTIVITAMIN W/MINERALS CH
1.0000 | ORAL_TABLET | Freq: Every day | ORAL | Status: DC
  Administered 2017-07-08 – 2017-07-13 (×5): 1 via ORAL
  Filled 2017-07-07 (×6): qty 1

## 2017-07-07 MED ORDER — POTASSIUM CHLORIDE CRYS ER 20 MEQ PO TBCR
40.0000 meq | EXTENDED_RELEASE_TABLET | ORAL | Status: AC
  Administered 2017-07-07 (×2): 40 meq via ORAL
  Filled 2017-07-07 (×3): qty 2

## 2017-07-07 NOTE — Progress Notes (Signed)
Family Medicine Teaching Service Daily Progress Note Intern Pager: [phone]  Patient name: Latoya [name] Medical record number: [id number] Date of birth: [birth year] Age: 26 y.o. Gender: female  Primary Care Provider: Fanny Bien, MD Consultants: GI Code Status: full  Pt Overview and Major Events to Date:  Latoya [name] is a 26 y.o. female presenting with abdominal pain . PMH is significant for HSV infection, substance abuse, depression, anxiety and anemia.   Assessment and Plan: Latoya [name] is a 26 y.o. female presenting with abdominal pain . PMH is significant for HSV infection, substance abuse, depression, anxiety and anemia.   Abdominal Pain/bloody diarrhea/Emesis: concerning for IBD given the duration her symptoms, exam finding and CT finding. C. Difficile and GI panel negative. UDS negative -Follow-up with GI recommendations -Follow up colonoscopy results -Clear liquids as tolerated -Follow up fecal Calprotectin -Percocet for pain -Zofran for nausea  Depression/Anxiety: Concerning for postpartum depression. She denies SI/HI. -cymbalta 30  Subclinical hyperthyroidism: TSH low, 0.121, but free T4 is WNL on 07/04/2017 -Outpatient follow-up  Concern for genital herpes: serologically suggestive for past exposure to HSV 1. HSV 1 and 2 IgM negative. Genital HSV swab negative as well -discontinue cyclobenzaprine  Iron deficiency Anemia: hemoglobin dropped to 7.0 on 11/12. Received 1 unit of blood. Hemoglobin up to 7.7 this morning.  -Repeat CBC  Hypoalbuminemia- 1.7 down from 3.7 end of Sept '18. Pre-albumin 11 (suggestive for malnutrition).  -nutrition consult  Hypokalemia-case 3.3 -K Dur 402  Substance use: UDS neg for anything but opioids. History of cocaine use  FEN/GI:  -clear liquid pending colonoscopy/Sligh sigmoidoscopy  Prophylaxis: SCD pending GI bleed and GI input.  Disposition: continue inpatient management pending workup for  abdominal pain and diarrhea  Subjective:  Patient down for colonoscopy.   Objective: Temp:  [97.5 F (36.4 C)-98.2 F (36.8 C)] 97.5 F (36.4 C) (10/13 0929) Pulse Rate:  [73-89] 88 (10/13 0929) Resp:  [14-20] 19 (10/13 0929) BP: (109-135)/(62-85) 125/85 (10/13 0929) SpO2:  [98 %-100 %] 100 % (10/13 0935) Physical Exam: Patient was not examined this morning. She is down getting colonoscopy  Laboratory:  Recent Labs Lab 07/05/17 0732 07/06/17 0657 07/06/17 2056 07/07/17 0554  WBC 10.7* 6.3  --  7.8  HGB 7.5* 7.0* 7.9* 7.7*  HCT 25.3* 23.5* 26.5* 25.8*  PLT 677* 611*  --  707*    Recent Labs Lab 07/04/17 0823  07/05/17 0732 07/06/17 0657 07/07/17 0554  NA 135  --  140 142 140  K 3.8  --  3.3* 3.5 3.3*  CL 97*  --  104 108 103  CO2 24  --  26 27 27   BUN 13  --  9 6 6   CREATININE 0.89  < > 0.76 0.78 0.61  CALCIUM 8.3*  --  8.2* 8.2* 8.3*  PROT 6.7  --  6.1* 6.0*  --   BILITOT 0.7  --  0.5 0.4  --   ALKPHOS 128*  --  114 116  --   ALT 24  --  22 29  --   AST 19  --  17 24  --   GLUCOSE 102*  --  100* 93 85  < > = values in this interval not displayed.    Imaging/Diagnostic Tests: Dg Chest 2 View  Result Date: 07/04/2017 CLINICAL DATA:  Fever and chest and abdominal pain EXAM: CHEST  2 VIEW COMPARISON:  Chest x-ray dated October 10, 2016 FINDINGS: The lungs are well-expanded and clear. The  heart and pulmonary vascularity are normal. The mediastinum is normal in width. There is no pleural effusion. The gas pattern in the upper abdomen is normal. The bony thorax is unremarkable. IMPRESSION: There is no active cardiopulmonary disease. Electronically Signed   By: David  Martinique M.D.   On: 07/04/2017 08:57   Ct Abdomen Pelvis W Contrast  Result Date: 07/04/2017 CLINICAL DATA:  Lower abdominal pain and diarrhea for 1 month. EXAM: CT ABDOMEN AND PELVIS WITH CONTRAST TECHNIQUE: Multidetector CT imaging of the abdomen and pelvis was performed using the standard protocol  following bolus administration of intravenous contrast. CONTRAST:  14m ISOVUE-300 IOPAMIDOL (ISOVUE-300) INJECTION 61% COMPARISON:  05/19/2011 CT abdomen/pelvis. FINDINGS: Lower chest: No significant pulmonary nodules or acute consolidative airspace disease. Hepatobiliary: Normal liver with no liver mass. Cholelithiasis, with no gallbladder wall thickening or pericholecystic fluid. No biliary ductal dilatation. Pancreas: Normal, with no mass or duct dilation. Spleen: Normal size. No mass. Adrenals/Urinary Tract: Normal adrenals. Normal kidneys with no hydronephrosis and no renal mass. Normal bladder. Stomach/Bowel: Grossly normal stomach. Normal caliber small bowel with no small bowel wall thickening. Appendix is within normal limits. There is diffuse mild-to-moderate colorectal wall thickening with associated pericolonic fat stranding without skip lesions. Vascular/Lymphatic: Normal caliber abdominal aorta. Patent portal, splenic, hepatic and renal veins. Mild right mesenteric adenopathy measuring up to 1.1 cm (series 3/ image 54), new. Reproductive: Grossly normal uterus.  No adnexal mass. Other: No pneumoperitoneum, ascites or focal fluid collection. Musculoskeletal: No aggressive appearing focal osseous lesions. IMPRESSION: 1. Nonspecific infectious or inflammatory pancolitis, with the differential including inflammatory bowel disease. No obstruction, perforation, fistula or abscess. 2. Nonspecific mild right mesenteric adenopathy, probably reactive. 3. Cholelithiasis. No CT findings of acute cholecystitis. No biliary ductal dilatation. Electronically Signed   By: JIlona SorrelM.D.   On: 07/04/2017 10:16   GMercy Riding MD 07/07/2017, 9:38 AM PGY-3, CPlant CityIntern pager: [phone] text pages welcome

## 2017-07-07 NOTE — Progress Notes (Signed)
Initial Nutrition Assessment  DOCUMENTATION CODES:   Not applicable  INTERVENTION:  Provide Boost Breeze po TID, each supplement provides 250 kcal and 9 grams of protein.  Provide multivitamin once daily.  Encourage adequate PO intake.   NUTRITION DIAGNOSIS:   Inadequate oral intake related to nausea, vomiting as evidenced by meal completion < 25%.  GOAL:   Patient will meet greater than or equal to 90% of their needs  MONITOR:   PO intake, Supplement acceptance, Diet advancement, Labs, Weight trends, Skin, I & O's  REASON FOR ASSESSMENT:   Consult Assessment of nutrition requirement/status (Low Prealbumin)  ASSESSMENT:   26 y.o. female presenting with abdominal pain . PMH is significant for HSV infection, substance abuse, depression, anxiety and anemia. Pt with with one month of abdominal pain, nausea/vomiting, and diarrhea. CT scan with findings of pancolitis.  Pt unavailable during attempted time of visit, pt undergoing colonoscopy. RD unable to obtain nutrition history or perform nutrition focused physical exam. Noted pt 7 weeks postpartum. Pt is currently on a clear liquid diet. Meal completion has been 10% since admission. RD to order Boost Breeze to aid in caloric and protein needs.   Labs and medications reviewed. Prealbumin 11.6.  Diet Order:  Diet clear liquid Room service appropriate? Yes; Fluid consistency: Thin  Skin:  Reviewed, no issues  Last BM:  10/12  Height:   Ht Readings from Last 1 Encounters:  07/04/17 5' 9"  (1.753 m)    Weight:   Wt Readings from Last 1 Encounters:  07/04/17 184 lb (83.5 kg)    Ideal Body Weight:  65.9 kg  BMI:  There is no height or weight on file to calculate BMI.  Estimated Nutritional Needs:   Kcal:  2000-2200  Protein:  85-100 grams  Fluid:  2- 2.2 L/day  EDUCATION NEEDS:   No education needs identified at this time  Corrin Parker, MS, RD, LDN Pager # [phone] After hours/ weekend pager #  [phone]

## 2017-07-07 NOTE — Progress Notes (Signed)
RN paged MD on call to request something for acid reflux as patient requesting medication for this, awaiting response.  P.J. Linus Mako, RN

## 2017-07-07 NOTE — Transfer of Care (Signed)
Immediate Anesthesia Transfer of Care Note  Patient: Latoya [name]  Procedure(s) Performed: COLONOSCOPY (N/A )  Patient Location: Endoscopy Unit  Anesthesia Type:MAC  Level of Consciousness: awake, alert  and oriented  Airway & Oxygen Therapy: Patient Spontanous Breathing  Post-op Assessment: Report given to RN and Post -op Vital signs reviewed and stable  Post vital signs: Reviewed and stable  Last Vitals:  Vitals:   07/07/17 0830 07/07/17 0929  BP: 135/81 125/85  Pulse: 81 88  Resp: 14 19  Temp: 36.7 C (!) 36.4 C  SpO2: 99% 100%    Last Pain:  Vitals:   07/07/17 0929  TempSrc: Oral  PainSc: 10-Worst pain ever      Patients Stated Pain Goal: 3 (77/82/42 3536)  Complications: No apparent anesthesia complications

## 2017-07-07 NOTE — Progress Notes (Signed)
Family Medicine Teaching Service Daily Progress Note Intern Pager: [phone]  Patient name: Latoya [name] Medical record number: [id number] Date of birth: 21-Sep-1991 Age: 26 y.o. Gender: female  Primary Care Provider: Fanny Bien, MD Consultants: GI Code Status: full  Pt Overview and Major Events to Date:  Latoya [name] is a 26 y.o. female presenting with abdominal pain . PMH is significant for HSV infection, substance abuse, depression, anxiety and anemia.   Assessment and Plan: Latoya [name] is a 26 y.o. female presenting with abdominal pain . PMH is significant for HSV infection, substance abuse, depression, anxiety and anemia.   Abdominal Pain/bloody diarrhea/Emesis: concerning for IBD given the duration her symptoms, exam finding and CT finding. C. Difficile and GI panel negative. UDS negative -Follow-up with GI recommendations -soft diet, low residual -prn bismuth/pepto -colonoscopy path pending -Percocet for pain -Zofran for nausea -solumedrol  Depression/Anxiety: Concerning for postpartum depression. She denies SI/HI. -cymbalta 30  Subclinical hyperthyroidism: TSH low, 0.121, but free T4 is WNL on 07/04/2017 -Outpatient follow-up  Concern for genital herpes: NEGATIVE  Iron deficiency Anemia: hemoglobin dropped to 7.7 on 11/13. Received 1 unit of blood 10/12.  -Repeat CBC  Hypoalbuminemia- 1.7 down from 3.7 end of Sept '18. Pre-albumin 11 (suggestive for malnutrition).  -nutrition consult and ordered supplementation  Hypokalemia-case 3.3 -K Dur 402  Substance use: UDS neg for anything but opioids. History of cocaine use  FEN/GI:  -clear liquid pending colonoscopy/Sligh sigmoidoscopy  Prophylaxis: SCD pending GI bleed and GI input.  Disposition: continue inpatient management pending workup for abdominal pain and diarrhea  Subjective:  Patient had complaints of heartburn and 1x vomiting overnight.   At initial complaint, was offered  TUMS.  When going to room after second complaint she was vomiting a few minutes after her pain med dose, we offered zofran (which she had refused prior) and bismuth in addition to replacing the vomited dose of percocet ~10mn after her zofran.  She notes improving and only trace blood in diarrhea.  Followup visit ~5am showed marked improvement and patient was sleeping on arrival, zofran resolved nausea and she was feeling much better  Objective: Temp:  [97.5 F (36.4 C)-98.1 F (36.7 C)] 98.1 F (36.7 C) (10/13 2207) Pulse Rate:  [71-89] 71 (10/13 2207) Resp:  [11-20] 19 (10/13 2207) BP: (116-135)/(62-85) 125/75 (10/13 2207) SpO2:  [97 %-100 %] 98 % (10/13 2207) Physical Exam: General: sleeping on arrival. Non-toxic appearing Cardiovascular: RRR. No murmur noted Respiratory: CTAB, no visible IWB. No wheezing or crackles Gastrointestinal: Soft, BS present, no distension. Tender to palpation diffusely. No rebound tenderness MSK: DP pulses strong. No LE edema Derm: Warm and dry. Neuro: Fluent speech. Alert, no gross neuro deficits noted Psych: Responds to questions appropriately but flat affect  Laboratory:  Recent Labs Lab 07/05/17 0732 07/06/17 0657 07/06/17 2056 07/07/17 0554  WBC 10.7* 6.3  --  7.8  HGB 7.5* 7.0* 7.9* 7.7*  HCT 25.3* 23.5* 26.5* 25.8*  PLT 677* 611*  --  707*    Recent Labs Lab 07/04/17 0823  07/05/17 0732 07/06/17 0657 07/07/17 0554  NA 135  --  140 142 140  K 3.8  --  3.3* 3.5 3.3*  CL 97*  --  104 108 103  CO2 24  --  26 27 27   BUN 13  --  9 6 6   CREATININE 0.89  < > 0.76 0.78 0.61  CALCIUM 8.3*  --  8.2* 8.2* 8.3*  PROT 6.7  --  6.1* 6.0*  --   BILITOT 0.7  --  0.5 0.4  --   ALKPHOS 128*  --  114 116  --   ALT 24  --  22 29  --   AST 19  --  17 24  --   GLUCOSE 102*  --  100* 93 85  < > = values in this interval not displayed.    Imaging/Diagnostic Tests: Dg Chest 2 View  Result Date: 07/04/2017 CLINICAL DATA:  Fever and chest and  abdominal pain EXAM: CHEST  2 VIEW COMPARISON:  Chest x-ray dated October 10, 2016 FINDINGS: The lungs are well-expanded and clear. The heart and pulmonary vascularity are normal. The mediastinum is normal in width. There is no pleural effusion. The gas pattern in the upper abdomen is normal. The bony thorax is unremarkable. IMPRESSION: There is no active cardiopulmonary disease. Electronically Signed   By: David  Martinique M.D.   On: 07/04/2017 08:57   Ct Abdomen Pelvis W Contrast  Result Date: 07/04/2017 CLINICAL DATA:  Lower abdominal pain and diarrhea for 1 month. EXAM: CT ABDOMEN AND PELVIS WITH CONTRAST TECHNIQUE: Multidetector CT imaging of the abdomen and pelvis was performed using the standard protocol following bolus administration of intravenous contrast. CONTRAST:  135m ISOVUE-300 IOPAMIDOL (ISOVUE-300) INJECTION 61% COMPARISON:  05/19/2011 CT abdomen/pelvis. FINDINGS: Lower chest: No significant pulmonary nodules or acute consolidative airspace disease. Hepatobiliary: Normal liver with no liver mass. Cholelithiasis, with no gallbladder wall thickening or pericholecystic fluid. No biliary ductal dilatation. Pancreas: Normal, with no mass or duct dilation. Spleen: Normal size. No mass. Adrenals/Urinary Tract: Normal adrenals. Normal kidneys with no hydronephrosis and no renal mass. Normal bladder. Stomach/Bowel: Grossly normal stomach. Normal caliber small bowel with no small bowel wall thickening. Appendix is within normal limits. There is diffuse mild-to-moderate colorectal wall thickening with associated pericolonic fat stranding without skip lesions. Vascular/Lymphatic: Normal caliber abdominal aorta. Patent portal, splenic, hepatic and renal veins. Mild right mesenteric adenopathy measuring up to 1.1 cm (series 3/ image 54), new. Reproductive: Grossly normal uterus.  No adnexal mass. Other: No pneumoperitoneum, ascites or focal fluid collection. Musculoskeletal: No aggressive appearing focal  osseous lesions. IMPRESSION: 1. Nonspecific infectious or inflammatory pancolitis, with the differential including inflammatory bowel disease. No obstruction, perforation, fistula or abscess. 2. Nonspecific mild right mesenteric adenopathy, probably reactive. 3. Cholelithiasis. No CT findings of acute cholecystitis. No biliary ductal dilatation. Electronically Signed   By: JIlona SorrelM.D.   On: 07/04/2017 10:16   BSherene Sires DO 07/08/2017, 5:41 AM PGY-3, [place] pager: [phone] text pages welcome

## 2017-07-07 NOTE — Anesthesia Postprocedure Evaluation (Signed)
Anesthesia Post Note  Patient: Latoya [name]  Procedure(s) Performed: COLONOSCOPY (N/A )     Patient location during evaluation: Endoscopy Anesthesia Type: MAC Level of consciousness: awake, awake and alert and oriented Pain management: pain level controlled Vital Signs Assessment: post-procedure vital signs reviewed and stable Respiratory status: spontaneous breathing, nonlabored ventilation and respiratory function stable Cardiovascular status: blood pressure returned to baseline Anesthetic complications: no    Last Vitals:  Vitals:   07/07/17 0935 07/07/17 0940  BP:  135/84  Pulse:  83  Resp:  11  Temp:    SpO2: 100% 100%    Last Pain:  Vitals:   07/07/17 1024  TempSrc:   PainSc: 10-Worst pain ever                 Marcas Bowsher COKER

## 2017-07-07 NOTE — Op Note (Addendum)
[facility] Patient Name: Latoya [name] Procedure Date : 07/07/2017 MRN: [id number] Attending MD: Juanita Craver , MD Date of Birth: 12/15/1990 CSN: [id number] Age: 26 Admit Type: Inpatient Procedure:                Colonoscopy with random biopsies. Indications:              Rectal bleeding; Change in bowel habits; Abnormal                            CT scan-pancolitis on CT scan. Providers:                Juanita Craver, MD, Angus Seller, RN, Alan Mulder,                            Technician, Clearnce Sorrel, CRNA. Referring MD:             Mauri Pole, MD Medicines:                Monitored Anesthesia Care Complications:            No immediate complications. Estimated Blood Loss:     Estimated blood loss was minimal. Procedure:                Pre-anesthesia assessment: - Prior to the                            procedure, a history and physical was performed,                            and patient medications and allergies were                            reviewed. The patient's tolerance of previous                            anesthesia was also reviewed. The risks and                            benefits of the procedure and the sedation options                            and risks were discussed with the patient. All                            questions were answered, and informed consent was                            obtained. Prior anticoagulants: The patient has                            taken no previous anticoagulant or antiplatelet                            agents. ASA Grade Assessment: II - A patient with  mild systemic disease. After reviewing the risks                            and benefits, the patient was deemed in                            satisfactory condition to undergo the procedure.                            After obtaining informed consent, the colonoscope                            was passed under direct vision.  Throughout the                            procedure, the patient's blood pressure, pulse, and                            oxygen saturations were monitored continuously. The                            EC-[id number] ([id number]) scope was introduced through                            the anus and advanced to the the cecum, identified                            by appendiceal orifice and ileocecal valve. The                            colonoscopy was extremely difficult due to                            inadequate bowel prep. Completion of the procedure                            was aided by lavage. The patient tolerated the                            procedure well. The quality of the bowel                            preparation was fair, at best inspite of aggressive                            lavage. The ileocecal valve and the rectum were                            photographed. The bowel preparation used was                            GoLYTELY. Scope In: 9:06:31 AM Scope Out: 9:20:49 AM Scope Withdrawal Time: 0 hours 10 minutes 6 seconds  Total Procedure Duration:  0 hours 14 minutes 18 seconds  Findings:      - Severe pancolitis with deep punched out ulcers and edematous,       hemorrhagic colonic mucosa noted throughout the colon-cecal base not       visualized due to a lot of debris in the cecum-multiple biopsies done       for pathology-?Crohn's disease-biopsies were done to confirm Crohn's       disease.      - Large ulcers noted at the anal verge on retroflexion.      - Inflamed hemorrhoid noted on anal inspection. Impression:               - Preparation of the colon was fair at best inspite                            of aggressive lavage.                           - Severe pancolitis with mucosal ulceration and                            edema with mucosal hemorrhage noted throughout the                            colon including the rectum-at the anal                             verge-biopsies done. Moderate Sedation:      MAC used. Recommendation:           - Soft, low residue diet with augmented water                            consumption daily.                           - Continue present medications.                           - Await pathology results.                           - Start Solumedrol today.                           - Avoid all NSAIDS for now. Procedure Code(s):        --- Professional ---                           [phone], Colonoscopy, flexible; with biopsy, single                            or multiple Diagnosis Code(s):        --- Professional ---                           K62.5, Hemorrhage of anus and rectum  D50.9, Iron deficiency anemia, unspecified                           R19.4, Change in bowel habit                           R93.3, Abnormal findings on diagnostic imaging of                            other parts of digestive tract CPT copyright 2016 [employer]. All rights reserved. The codes documented in this report are preliminary and upon coder review may  be revised to meet current compliance requirements. Juanita Craver, MD Juanita Craver, MD 07/07/2017 10:12:08 AM This report has been signed electronically. Number of Addenda: 0

## 2017-07-07 NOTE — Anesthesia Preprocedure Evaluation (Signed)
Anesthesia Evaluation  Patient identified by MRN, date of birth, ID band Patient awake    Reviewed: Allergy & Precautions, NPO status , Patient's Chart, lab work & pertinent test results  Airway Mallampati: II  TM Distance: >3 FB Neck ROM: Full    Dental  (+) Teeth Intact, Dental Advisory Given   Pulmonary    breath sounds clear to auscultation       Cardiovascular hypertension,  Rhythm:Regular Rate:Normal     Neuro/Psych    GI/Hepatic   Endo/Other  diabetes  Renal/GU      Musculoskeletal   Abdominal   Peds  Hematology   Anesthesia Other Findings   Reproductive/Obstetrics                             Anesthesia Physical Anesthesia Plan  ASA: III  Anesthesia Plan: MAC   Post-op Pain Management:    Induction: Intravenous  PONV Risk Score and Plan: Ondansetron  Airway Management Planned: Natural Airway and Simple Face Mask  Additional Equipment:   Intra-op Plan:   Post-operative Plan:   Informed Consent: I have reviewed the patients History and Physical, chart, labs and discussed the procedure including the risks, benefits and alternatives for the proposed anesthesia with the patient or authorized representative who has indicated his/her understanding and acceptance.   Dental advisory given  Plan Discussed with: CRNA and Anesthesiologist  Anesthesia Plan Comments:         Anesthesia Quick Evaluation

## 2017-07-07 NOTE — Progress Notes (Signed)
Pharmacy Antibiotic Note  Latoya [name] is a 26 y.o. female admitted on 07/04/2017 with colitis.  Pharmacy has been consulted for Zosyn dosing. She has been afebrile, WBC trended down to 7.8, renal function is stable, and the GI panel was negative. She had a colonoscopy today that found severe pancolitis with ulcers and hemorrhagic mucosa.  Plan: Zosyn 3.375g IV Q8h Follow clinical status, LOT, renal function  Temp (24hrs), Avg:97.9 F (36.6 C), Min:97.5 F (36.4 C), Max:98.2 F (36.8 C)   Recent Labs Lab 07/04/17 0823 07/04/17 2055 07/05/17 0732 07/06/17 0657 07/07/17 0554  WBC 11.0* 9.1 10.7* 6.3 7.8  CREATININE 0.89 0.72 0.76 0.78 0.61    Estimated Creatinine Clearance: 125.1 mL/min (by C-G formula based on SCr of 0.61 mg/dL).    No Known Allergies  Antimicrobials this admission: Zosyn 10/10>>  Microbiology results: 10/10 GI panel>>negative    Patterson Hammersmith PharmD PGY1 Pharmacy Practice Resident 07/07/2017 1:14 PM Pager: [phone]

## 2017-07-08 ENCOUNTER — Encounter (HOSPITAL_COMMUNITY): Payer: Self-pay | Admitting: Gastroenterology

## 2017-07-08 LAB — BASIC METABOLIC PANEL
Anion gap: 8 (ref 5–15)
BUN: <5 mg/dL — ABNORMAL LOW (ref 6–20)
CO2: 28 mmol/L (ref 22–32)
Calcium: 8.6 mg/dL — ABNORMAL LOW (ref 8.9–10.3)
Chloride: 102 mmol/L (ref 101–111)
Creatinine, Ser: 0.62 mg/dL (ref 0.44–1.00)
GFR calc Af Amer: >60 mL/min (ref >60)
GFR calc non Af Amer: >60 mL/min (ref >60)
Glucose, Bld: 92 mg/dL (ref 65–99)
Potassium: 4 mmol/L (ref 3.5–5.1)
Sodium: 138 mmol/L (ref 135–145)

## 2017-07-08 LAB — CBC
HCT: 28.2 % — ABNORMAL LOW (ref 36.0–46.0)
Hemoglobin: 8.4 g/dL — ABNORMAL LOW (ref 12.0–15.0)
MCH: 24.1 pg — ABNORMAL LOW (ref 26.0–34.0)
MCHC: 29.8 g/dL — ABNORMAL LOW (ref 30.0–36.0)
MCV: 81 fL (ref 78.0–100.0)
Platelets: 844 10*3/uL — ABNORMAL HIGH (ref 150–400)
RBC: 3.48 MIL/uL — ABNORMAL LOW (ref 3.87–5.11)
RDW: 15.2 % (ref 11.5–15.5)
WBC: 15.3 10*3/uL — ABNORMAL HIGH (ref 4.0–10.5)

## 2017-07-08 MED ORDER — MAGIC MOUTHWASH
5.0000 mL | Freq: Three times a day (TID) | ORAL | Status: DC
  Administered 2017-07-08 – 2017-07-13 (×10): 5 mL via ORAL
  Filled 2017-07-08 (×9): qty 5

## 2017-07-08 MED ORDER — OXYCODONE-ACETAMINOPHEN 5-325 MG PO TABS
1.0000 | ORAL_TABLET | Freq: Once | ORAL | Status: AC
  Administered 2017-07-08: 1 via ORAL
  Filled 2017-07-08: qty 1

## 2017-07-08 MED ORDER — ZINC OXIDE 40 % EX OINT
TOPICAL_OINTMENT | Freq: Every day | CUTANEOUS | Status: DC
  Administered 2017-07-12 – 2017-07-13 (×2): via TOPICAL
  Filled 2017-07-08 (×4): qty 114

## 2017-07-08 MED ORDER — BISMUTH SUBSALICYLATE 262 MG/15ML PO SUSP
30.0000 mL | Freq: Once | ORAL | Status: AC
  Administered 2017-07-08: 30 mL via ORAL
  Filled 2017-07-08: qty 118

## 2017-07-08 MED ORDER — PANTOPRAZOLE SODIUM 40 MG PO TBEC
40.0000 mg | DELAYED_RELEASE_TABLET | Freq: Every day | ORAL | Status: DC
  Administered 2017-07-08 – 2017-07-13 (×5): 40 mg via ORAL
  Filled 2017-07-08 (×6): qty 1

## 2017-07-08 MED ORDER — SUCRALFATE 1 G PO TABS
1.0000 g | ORAL_TABLET | Freq: Once | ORAL | Status: AC
  Administered 2017-07-08: 1 g via ORAL
  Filled 2017-07-08: qty 1

## 2017-07-08 NOTE — Progress Notes (Signed)
Cross cover for the  GI Subjective: Since I last evaluated the patient, she seems to be doing somewhat better today after starting off from treatment with steroids. Her abdominal pain is improved but she still continues to have a lot of rectal pain she's requesting sitz baths. The diarrhea she was having a admission has since resolved. She has had 2 small volume bowel movements today.  Objective: Vital signs in last 24 hours: Temp:  [98.1 F (36.7 C)] 98.1 F (36.7 C) (10/14 0612) Pulse Rate:  [70-87] 70 (10/14 0612) Resp:  [19-20] 20 (10/14 0810) BP: (116-125)/(62-75) 121/65 (10/14 0612) SpO2:  [96 %-98 %] 96 % (10/14 0810) Last BM Date: 07/07/17  Intake/Output from previous day: 10/13 0701 - 10/14 0700 In: 200 [I.V.:200] Out: 1 [Blood:1] Intake/Output this shift: No intake/output data recorded.  General appearance: appears stated age, fatigued, no distress and pale Resp: clear to auscultation bilaterally Cardio: regular rate and rhythm, S1, S2 normal, no murmur, click, rub or gallop GI: soft, non-tender; bowel sounds normal; no masses,  no organomegaly  Lab Results:  Recent Labs  07/06/17 0657 07/06/17 2056 07/07/17 0554 07/08/17 0556  WBC 6.3  --  7.8 15.3*  HGB 7.0* 7.9* 7.7* 8.4*  HCT 23.5* 26.5* 25.8* 28.2*  PLT 611*  --  707* 844*   BMET  Recent Labs  07/06/17 0657 07/07/17 0554 07/08/17 0556  NA 142 140 138  K 3.5 3.3* 4.0  CL 108 103 102  CO2 27 27 28   GLUCOSE 93 85 92  BUN 6 6 <5*  CREATININE 0.78 0.61 0.62  CALCIUM 8.2* 8.3* 8.6*   LFT  Recent Labs  07/06/17 0657  PROT 6.0*  ALBUMIN 1.8*  AST 24  ALT 29  ALKPHOS 116  BILITOT 0.4   Medications: I have reviewed the patient's current medications.  Assessment/Plan: 1) Pancolitis with multiple large ulcers of severe edema and hemorrhage on colonoscopy biopsies pending I suspect this is severe Crohn's disease she will need treatment with Biologics once we get the final pathology. Dr.  Owens Loffler is to see her tomorrow.  2) Polysubstance abuse.  3) Postpartum depression.  LOS: 4 days   Donnette Macmullen 07/08/2017, 12:17 PM

## 2017-07-08 NOTE — Progress Notes (Signed)
RN notified Dr. Criss Rosales to make him aware that patient states 1 Percocet is not helping with pain and also that she has sores in her mouth appearing to be irritated taste buds.  RN had previously notified MD that patient requested more medication for heartburn and new order for Carafate received. RN awaiting response and will continue to monitor patient and report any changes to MD as needed.  P.J. Linus Mako, RN

## 2017-07-08 NOTE — Progress Notes (Signed)
Sitz bath given at 14:00 over 20 min

## 2017-07-09 ENCOUNTER — Inpatient Hospital Stay (HOSPITAL_COMMUNITY): Payer: Medicaid Other

## 2017-07-09 DIAGNOSIS — D696 Thrombocytopenia, unspecified: Secondary | ICD-10-CM

## 2017-07-09 DIAGNOSIS — K51918 Ulcerative colitis, unspecified with other complication: Secondary | ICD-10-CM

## 2017-07-09 DIAGNOSIS — K625 Hemorrhage of anus and rectum: Secondary | ICD-10-CM

## 2017-07-09 DIAGNOSIS — E46 Unspecified protein-calorie malnutrition: Secondary | ICD-10-CM

## 2017-07-09 LAB — BASIC METABOLIC PANEL
Anion gap: 12 (ref 5–15)
BUN: 8 mg/dL (ref 6–20)
CO2: 25 mmol/L (ref 22–32)
Calcium: 8.7 mg/dL — ABNORMAL LOW (ref 8.9–10.3)
Chloride: 101 mmol/L (ref 101–111)
Creatinine, Ser: 0.81 mg/dL (ref 0.44–1.00)
GFR calc Af Amer: >60 mL/min (ref >60)
GFR calc non Af Amer: >60 mL/min (ref >60)
Glucose, Bld: 132 mg/dL — ABNORMAL HIGH (ref 65–99)
Potassium: 3.5 mmol/L (ref 3.5–5.1)
Sodium: 138 mmol/L (ref 135–145)

## 2017-07-09 LAB — LIPID PANEL
Cholesterol: 109 mg/dL (ref 0–200)
HDL: 44 mg/dL (ref >40)
LDL Cholesterol: 37 mg/dL (ref 0–99)
Total CHOL/HDL Ratio: 2.5 RATIO
Triglycerides: 138 mg/dL (ref <150)
VLDL: 28 mg/dL (ref 0–40)

## 2017-07-09 LAB — CBC
HCT: 31.1 % — ABNORMAL LOW (ref 36.0–46.0)
Hemoglobin: 9.8 g/dL — ABNORMAL LOW (ref 12.0–15.0)
MCH: 25.5 pg — ABNORMAL LOW (ref 26.0–34.0)
MCHC: 31.5 g/dL (ref 30.0–36.0)
MCV: 81 fL (ref 78.0–100.0)
Platelets: 1048 10*3/uL (ref 150–400)
RBC: 3.84 MIL/uL — ABNORMAL LOW (ref 3.87–5.11)
RDW: 15.4 % (ref 11.5–15.5)
WBC: 17.4 10*3/uL — ABNORMAL HIGH (ref 4.0–10.5)

## 2017-07-09 LAB — COMPREHENSIVE METABOLIC PANEL
ALT: 32 U/L (ref 14–54)
AST: 18 U/L (ref 15–41)
Albumin: 2.2 g/dL — ABNORMAL LOW (ref 3.5–5.0)
Alkaline Phosphatase: 120 U/L (ref 38–126)
Anion gap: 10 (ref 5–15)
BUN: 8 mg/dL (ref 6–20)
CO2: 24 mmol/L (ref 22–32)
Calcium: 8.5 mg/dL — ABNORMAL LOW (ref 8.9–10.3)
Chloride: 103 mmol/L (ref 101–111)
Creatinine, Ser: 0.72 mg/dL (ref 0.44–1.00)
GFR calc Af Amer: >60 mL/min (ref >60)
GFR calc non Af Amer: >60 mL/min (ref >60)
Glucose, Bld: 133 mg/dL — ABNORMAL HIGH (ref 65–99)
Potassium: 3.3 mmol/L — ABNORMAL LOW (ref 3.5–5.1)
Sodium: 137 mmol/L (ref 135–145)
Total Bilirubin: 0.6 mg/dL (ref 0.3–1.2)
Total Protein: 6.2 g/dL — ABNORMAL LOW (ref 6.5–8.1)

## 2017-07-09 LAB — LIPASE, BLOOD: Lipase: 508 U/L — ABNORMAL HIGH (ref 11–51)

## 2017-07-09 LAB — PATHOLOGIST SMEAR REVIEW

## 2017-07-09 MED ORDER — DIPHENHYDRAMINE HCL 12.5 MG/5ML PO ELIX
12.5000 mg | ORAL_SOLUTION | Freq: Once | ORAL | Status: AC | PRN
  Administered 2017-07-10: 12.5 mg via ORAL
  Filled 2017-07-09: qty 5

## 2017-07-09 MED ORDER — IOHEXOL 300 MG/ML  SOLN: 15.0000 mL | INTRAMUSCULAR | Status: AC

## 2017-07-09 MED ORDER — IOPAMIDOL (ISOVUE-300) INJECTION 61%
INTRAVENOUS | Status: AC
  Administered 2017-07-09: 100 mL
  Filled 2017-07-09: qty 100

## 2017-07-09 MED ORDER — MORPHINE SULFATE (PF) 4 MG/ML IV SOLN
4.0000 mg | INTRAVENOUS | Status: DC | PRN
  Administered 2017-07-09: 4 mg via INTRAVENOUS
  Filled 2017-07-09: qty 1

## 2017-07-09 MED ORDER — IOPAMIDOL (ISOVUE-300) INJECTION 61%
INTRAVENOUS | Status: AC
  Administered 2017-07-09: 60 mL
  Filled 2017-07-09: qty 30

## 2017-07-09 MED ORDER — LACTATED RINGERS IV SOLN
INTRAVENOUS | Status: DC
  Administered 2017-07-09 – 2017-07-13 (×15): via INTRAVENOUS

## 2017-07-09 MED ORDER — LACTATED RINGERS IV SOLN: INTRAVENOUS | Status: DC

## 2017-07-09 MED ORDER — MORPHINE SULFATE (PF) 4 MG/ML IV SOLN
4.0000 mg | Freq: Once | INTRAVENOUS | Status: AC
  Administered 2017-07-09: 4 mg via INTRAVENOUS
  Filled 2017-07-09: qty 1

## 2017-07-09 MED ORDER — MORPHINE SULFATE (PF) 4 MG/ML IV SOLN
4.0000 mg | INTRAVENOUS | Status: DC | PRN
  Administered 2017-07-09 – 2017-07-10 (×9): 4 mg via INTRAVENOUS
  Filled 2017-07-09 (×10): qty 1

## 2017-07-09 MED ORDER — KETOROLAC TROMETHAMINE 15 MG/ML IJ SOLN
15.0000 mg | Freq: Four times a day (QID) | INTRAMUSCULAR | Status: DC | PRN
  Administered 2017-07-09 – 2017-07-11 (×7): 15 mg via INTRAVENOUS
  Filled 2017-07-09 (×7): qty 1

## 2017-07-09 NOTE — Progress Notes (Signed)
Family Medicine Teaching Service Daily Progress Note Intern Pager: [phone]  Patient name: Latoya [name] Medical record number: [id number] Date of birth: 11-May-1991 Age: 26 y.o. Gender: female  Primary Care Provider: Fanny Bien, MD Consultants: GI Code Status: full  Pt Overview and Major Events to Date:  Latoya [name] is a 26 y.o. female presenting with abdominal pain . PMH is significant for HSV infection, substance abuse, depression, anxiety and anemia.   Assessment and Plan: Latoya [name] is a 26 y.o. female presenting with abdominal pain . PMH is significant for HSV infection, substance abuse, depression, anxiety and anemia.   Crohn's vs UC: significant increase in expressed pain overnight,  -Follow-up with GI recommendations -soft diet, low residual -colonoscopy path pending -Percocet for pain (did offer 2x prn meds, consider IV in light of nausea) -Zofran for nausea -solumedrol -CT w/ contrast  Depression/Anxiety: Concerning for postpartum depression. She denies SI/HI. -cymbalta 30  Subclinical hyperthyroidism: TSH low, 0.121, but free T4 is WNL on 07/04/2017 -Outpatient follow-up  Concern for genital herpes: NEGATIVE  Iron deficiency Anemia: hemoglobin dropped to 7.7 on 11/13. Received 1 unit of blood 10/12.  -Repeat CBC  Hypoalbuminemia- 1.7 down from 3.7 end of Sept '18. Pre-albumin 11 (suggestive for malnutrition).  -nutrition consult and ordered supplementation  Substance use: UDS neg for anything but opioids. History of cocaine use  FEN/GI:  -clear liquid pending colonoscopy/Sligh sigmoidoscopy  Prophylaxis: SCD pending GI bleed and GI input.  Disposition: continue inpatient management pending workup for abdominal pain and diarrhea  Subjective:  She had multiple pages about expressedpain overnight, on interview she was writhing around on the bed.   She says she had 2x "green" vomiting overnight and did have a non-bloody small bowel  movement.   She denies a fall or any instantaneous rapid onset to the pain.   No other symptoms claimed beyond a generalized "crampy" pain throughout her abdomen.  We discussed possible need for imaging to evaluate for this much of a pain increase and she said "do whatever, just make it stop"  Objective: Temp:  [98 F (36.7 C)-98.2 F (36.8 C)] 98.2 F (36.8 C) (10/14 2117) Pulse Rate:  [70-85] 85 (10/14 2117) Resp:  [17-20] 17 (10/14 2117) BP: (109-121)/(64-66) 121/64 (10/14 2117) SpO2:  [96 %-100 %] 100 % (10/14 2117) Physical Exam: General: moaning about pain, writhing on bed during exam.  Non-toxic appearing Cardiovascular: Patient could not hold still/quiet enough for good auscultation Respiratory: Patient could not hold still/quiet enough for good auscultation but no wheezing/IWB grossly noticeable Gastrointestinal: Soft,no distension. Very Tender to palpation diffusely. No rebound tenderness, no masses noted, pain is not localized to RUQ or RLQ. MSK: DP pulses strong. No LE edema Derm: Warm and dry. Neuro: Fluent speech. Alert, no gross neuro deficits noted Psych: Responds to questions appropriately but flat affect  Laboratory:  Recent Labs Lab 07/06/17 0657 07/06/17 2056 07/07/17 0554 07/08/17 0556  WBC 6.3  --  7.8 15.3*  HGB 7.0* 7.9* 7.7* 8.4*  HCT 23.5* 26.5* 25.8* 28.2*  PLT 611*  --  707* 844*    Recent Labs Lab 07/04/17 0823  07/05/17 0732 07/06/17 0657 07/07/17 0554 07/08/17 0556  NA 135  --  140 142 140 138  K 3.8  --  3.3* 3.5 3.3* 4.0  CL 97*  --  104 108 103 102  CO2 24  --  26 27 27 28   BUN 13  --  9 6 6  <5*  CREATININE 0.89  < >  0.76 0.78 0.61 0.62  CALCIUM 8.3*  --  8.2* 8.2* 8.3* 8.6*  PROT 6.7  --  6.1* 6.0*  --   --   BILITOT 0.7  --  0.5 0.4  --   --   ALKPHOS 128*  --  114 116  --   --   ALT 24  --  22 29  --   --   AST 19  --  17 24  --   --   GLUCOSE 102*  --  100* 93 85 92  < > = values in this interval not  displayed.    Imaging/Diagnostic Tests: Dg Chest 2 View  Result Date: 07/04/2017 CLINICAL DATA:  Fever and chest and abdominal pain EXAM: CHEST  2 VIEW COMPARISON:  Chest x-ray dated October 10, 2016 FINDINGS: The lungs are well-expanded and clear. The heart and pulmonary vascularity are normal. The mediastinum is normal in width. There is no pleural effusion. The gas pattern in the upper abdomen is normal. The bony thorax is unremarkable. IMPRESSION: There is no active cardiopulmonary disease. Electronically Signed   By: David  Martinique M.D.   On: 07/04/2017 08:57   Ct Abdomen Pelvis W Contrast  Result Date: 07/04/2017 CLINICAL DATA:  Lower abdominal pain and diarrhea for 1 month. EXAM: CT ABDOMEN AND PELVIS WITH CONTRAST TECHNIQUE: Multidetector CT imaging of the abdomen and pelvis was performed using the standard protocol following bolus administration of intravenous contrast. CONTRAST:  175m ISOVUE-300 IOPAMIDOL (ISOVUE-300) INJECTION 61% COMPARISON:  05/19/2011 CT abdomen/pelvis. FINDINGS: Lower chest: No significant pulmonary nodules or acute consolidative airspace disease. Hepatobiliary: Normal liver with no liver mass. Cholelithiasis, with no gallbladder wall thickening or pericholecystic fluid. No biliary ductal dilatation. Pancreas: Normal, with no mass or duct dilation. Spleen: Normal size. No mass. Adrenals/Urinary Tract: Normal adrenals. Normal kidneys with no hydronephrosis and no renal mass. Normal bladder. Stomach/Bowel: Grossly normal stomach. Normal caliber small bowel with no small bowel wall thickening. Appendix is within normal limits. There is diffuse mild-to-moderate colorectal wall thickening with associated pericolonic fat stranding without skip lesions. Vascular/Lymphatic: Normal caliber abdominal aorta. Patent portal, splenic, hepatic and renal veins. Mild right mesenteric adenopathy measuring up to 1.1 cm (series 3/ image 54), new. Reproductive: Grossly normal uterus.  No  adnexal mass. Other: No pneumoperitoneum, ascites or focal fluid collection. Musculoskeletal: No aggressive appearing focal osseous lesions. IMPRESSION: 1. Nonspecific infectious or inflammatory pancolitis, with the differential including inflammatory bowel disease. No obstruction, perforation, fistula or abscess. 2. Nonspecific mild right mesenteric adenopathy, probably reactive. 3. Cholelithiasis. No CT findings of acute cholecystitis. No biliary ductal dilatation. Electronically Signed   By: JIlona SorrelM.D.   On: 07/04/2017 10:16   BSherene Sires DO 07/09/2017, 5:54 AM PGY-3, [place] pager: [phone] text pages welcome

## 2017-07-09 NOTE — Progress Notes (Signed)
RN received call from Dr. Criss Rosales that he ordered Morphine 4 mg IV x1 and CT of abdomen to rule out any worsening condition of patient.  RN will give medication per order and explain CT to patient. RN will continue to monitor patient and report any issues to MD.  P.J. Linus Mako, RN

## 2017-07-09 NOTE — Progress Notes (Signed)
Daily Rounding Note  07/09/2017, 1:08 PM  LOS: 5 days   SUBJECTIVE:   Chief complaint:   Diffuse abdominal pain.  Morphine had not been effective in controlling pain. After a dose of Toradol this morning, her pain it has improved. About 5-7 episodes of diarrhea yesterday. Did not see much blood.  OBJECTIVE:         Vital signs in last 24 hours:    Temp:  [97.3 F (36.3 C)-98.2 F (36.8 C)] 97.3 F (36.3 C) (10/15 0605) Pulse Rate:  [70-98] 98 (10/15 0605) Resp:  [17-20] 20 (10/15 0605) BP: (109-136)/(64-75) 136/75 (10/15 0605) SpO2:  [99 %-100 %] 100 % (10/15 0605) Last BM Date: 07/07/17 There were no vitals filed for this visit. General: very flat affect. Currently comfortable. Doesn't want to talk.   Heart: RRR. Chest: clear bilaterally. No labored breathing. Abdomen: soft. Hypoactive bowel sounds. Tender diffusely but no guarding or rebound.  Extremities: no CCE. Neuro/Psych:  Flat affect. Very depressed.   Alert. Oriented times 3.   Lab Results:  Recent Labs  07/07/17 0554 07/08/17 0556 07/09/17 0623  WBC 7.8 15.3* 17.4*  HGB 7.7* 8.4* 9.8*  HCT 25.8* 28.2* 31.1*  PLT 707* 844* 1,048*   BMET  Recent Labs  07/08/17 0556 07/09/17 0623 07/09/17 1035  NA 138 138 137  K 4.0 3.5 3.3*  CL 102 101 103  CO2 28 25 24   GLUCOSE 92 132* 133*  BUN <5* 8 8  CREATININE 0.62 0.81 0.72  CALCIUM 8.6* 8.7* 8.5*   LFT  Recent Labs  07/09/17 1035  PROT 6.2*  ALBUMIN 2.2*  AST 18  ALT 32  ALKPHOS 120  BILITOT 0.6    Studies/Results: Ct Abdomen Pelvis W Contrast  Result Date: 07/09/2017 CLINICAL DATA:  Abdominal pain for 4 hours. EXAM: CT ABDOMEN AND PELVIS WITH CONTRAST TECHNIQUE: Multidetector CT imaging of the abdomen and pelvis was performed using the standard protocol following bolus administration of intravenous contrast. CONTRAST:  149m ISOVUE-300 IOPAMIDOL (ISOVUE-300) INJECTION 61%  COMPARISON:  07/04/2017 FINDINGS: Lower chest: No acute abnormality. Hepatobiliary: No focal liver abnormality is seen. Tiny stones noted within the dependent portion of the gallbladder. Pancreas: There is marked diffuse edema of the pancreas with peripancreatic fluid compatible with acute pancreatitis. No evidence for pancreatic necrosis. Spleen: The spleen is normal. Adrenals/Urinary Tract: The adrenal glands are unremarkable. Unremarkable appearance of the left kidney peer small right renal calculus measures 2 mm, image 29 of series 3. No hydronephrosis. The urinary bladder appears normal for degree of distention. Stomach/Bowel: Stomach is normal. No dilated loops of small bowel. No pathologic dilatation of the colon. Mild edema involving the wall of the colon from the splenic flexure to the sigmoid colon noted compatible with residual colitis. No evidence for bowel perforation or abscess. No obstruction. The appendix is visualized and appears normal. Vascular/Lymphatic: Normal appearance of the abdominal aorta. No enlarged retroperitoneal or mesenteric adenopathy. No enlarged pelvic or inguinal lymph nodes. Reproductive: Uterus and bilateral adnexa are unremarkable. Other: Fluid is identified within the lesser sac. Small volume of ascites noted within the pelvis. Musculoskeletal: No acute or significant osseous findings. IMPRESSION: 1. Exam positive for diffuse pancreatic edema and peripancreatic fluid compatible with acute pancreatitis. No pseudocyst identified and no evidence for pancreatic necrosis. 2. Gallstones. 3. Mild residual colitis from the splenic flexure to the sigmoid colon. These results will be called to the ordering clinician or representative by the Radiologist  Assistant, and communication documented in the PACS or zVision Dashboard. Electronically Signed   By: Kerby Moors M.D.   On: 07/09/2017 09:29    ASSESMENT:   *  Pan colitis and diarhea with some blood PR.  Hemorrhoids on exam CT  scans on 10/10 and 10/15. #1 with pan colitis, normal pancreas.  # 2 with mild colitis but new pancreatitis. Tiny gallstones.   07/07/17 Colonoscopy:  Required extensive lavage, fair prep.   Severe pan-colitis including rectum with ulceration, mucosal hemorrhage. Pathology from biopsies:  In process.   Empiric Solumedrol initiated, day 3.  WBCs 7.8 >> 15.3 >> 17.4.    *  Pancreatitis.  Lipase and LFT at admission normal.  Lipase today in 508. LFTs not elevated. Lipid panel normal.   ? Biliary etiology.  Ran her list of medications through the pocket to use for potential medication related pancreatitis. Cymbalta, Zovirax, Zosyn are not linked to pancreatitis, Protonix is linked to pancreatitis but she only received a single dose of this yesterday.  *  Acute, progressive thrombocytosis.  600s >> 704 >> 844 >> 1048.    *  IDA.    *  Severe depression.    *  Protein malnutrition.    *  Anemia.  Low iron and iron sat, normal ferritin.  Low ferritin in 12/2016.    *  Substance abuse d/o recent brief inpt psych for cocaine relapse, left AMA when they would not Rx opiates.    PLAN   *  Pt is NPO, will allow clears.  Switch to LR in place of NS,  Rate 200 for 8 hours then to 150/hour.  *  Waiting on: Pathology reports. Confirmed that this is in process.     Azucena Freed  07/09/2017, 1:08 PM Pager: [phone]  ________________________________________________________________________  Rudolpho Sevin MD note:  I personally examined the patient, reviewed the data and agree with the assessment and plan described above.  Colitis improving clinically since IV steroids started (no diarrhea at all today).  ACute pancreatitis is most likely gallstone related and probably just a terrible coincidence to occur during acute colitis admission. I've increased her IV fluids to 300cc per hour for now since she's had no urine output in many hours. Pain control is a challenge.  Trial of clear liquids.  WIll follow  along.   Owens Loffler, MD [facility] Gastroenterology Pager [phone]

## 2017-07-09 NOTE — Progress Notes (Signed)
RN paged Dr. Criss Rosales and made him aware patient continues to ask for additional pain medication and is frustrated she is having pain after 1 Oxycodone.  RN informed patient she would make MD aware, but unsure additional pain medication will be ordered. Patient voiced understanding. RN awaiting return call and will continue to monitor patient.  P.J. Linus Mako, RN

## 2017-07-09 NOTE — Progress Notes (Signed)
Dr. Criss Rosales came to see patient and at this time did not order further pain medication.  Patient angry because she did not receive further pain medications and got up and left her room, going downstairs to the lobby.  Patient did return to her room and is very belligerent to staff, yelling that she is in severe pain, something isn't right, and she needs more pain medication. RN attempted to reason with patient and patient continues to be upset and tearful.  Patient had her mother call RN wanted RN to explain situation to her mother. RN explained to mother that patient has been given Percocet as ordered, an additional one time dose of Percocet, as well as Carafate this shift and that patient can have another dose of Percocet at 0801.  RN further explained that MD had not ordered additional pain medication to already ordered pain medicine, but that pain medication is still ordered every 4 hours as needed since mother appeared confused and thought pain medication had been discontinued.  Mother states she will speak to patient again and try to calm her down. RN will continue to monitor patient and report any issues to MD.  P.J. Linus Mako, RN

## 2017-07-09 NOTE — Progress Notes (Signed)
Pt has critical lab value platelet of 1,048. MD has been notified.

## 2017-07-10 ENCOUNTER — Encounter: Payer: Self-pay | Admitting: *Deleted

## 2017-07-10 DIAGNOSIS — K859 Acute pancreatitis without necrosis or infection, unspecified: Secondary | ICD-10-CM

## 2017-07-10 LAB — BASIC METABOLIC PANEL
Anion gap: 6 (ref 5–15)
BUN: 9 mg/dL (ref 6–20)
CO2: 28 mmol/L (ref 22–32)
Calcium: 8.3 mg/dL — ABNORMAL LOW (ref 8.9–10.3)
Chloride: 102 mmol/L (ref 101–111)
Creatinine, Ser: 0.59 mg/dL (ref 0.44–1.00)
GFR calc Af Amer: >60 mL/min (ref >60)
GFR calc non Af Amer: >60 mL/min (ref >60)
Glucose, Bld: 87 mg/dL (ref 65–99)
Potassium: 4.4 mmol/L (ref 3.5–5.1)
Sodium: 136 mmol/L (ref 135–145)

## 2017-07-10 LAB — CBC
HCT: 28.8 % — ABNORMAL LOW (ref 36.0–46.0)
Hemoglobin: 8.5 g/dL — ABNORMAL LOW (ref 12.0–15.0)
MCH: 24.3 pg — ABNORMAL LOW (ref 26.0–34.0)
MCHC: 29.5 g/dL — ABNORMAL LOW (ref 30.0–36.0)
MCV: 82.3 fL (ref 78.0–100.0)
Platelets: 806 10*3/uL — ABNORMAL HIGH (ref 150–400)
RBC: 3.5 MIL/uL — ABNORMAL LOW (ref 3.87–5.11)
RDW: 15.9 % — ABNORMAL HIGH (ref 11.5–15.5)
WBC: 15.3 10*3/uL — ABNORMAL HIGH (ref 4.0–10.5)

## 2017-07-10 LAB — LIPASE, BLOOD: Lipase: 294 U/L — ABNORMAL HIGH (ref 11–51)

## 2017-07-10 MED ORDER — MORPHINE SULFATE (PF) 4 MG/ML IV SOLN: 4.0000 mg | INTRAVENOUS | Status: DC | PRN

## 2017-07-10 MED ORDER — PREDNISONE 20 MG PO TABS
40.0000 mg | ORAL_TABLET | Freq: Every day | ORAL | Status: DC
  Filled 2017-07-10 (×2): qty 2

## 2017-07-10 MED ORDER — OXYCODONE HCL 5 MG PO TABS
5.0000 mg | ORAL_TABLET | ORAL | Status: DC | PRN
  Administered 2017-07-10 – 2017-07-11 (×5): 5 mg via ORAL
  Filled 2017-07-10 (×5): qty 1

## 2017-07-10 MED ORDER — DIPHENHYDRAMINE HCL 25 MG PO CAPS
25.0000 mg | ORAL_CAPSULE | Freq: Four times a day (QID) | ORAL | Status: DC | PRN
  Administered 2017-07-10: 25 mg via ORAL
  Filled 2017-07-10: qty 1

## 2017-07-10 MED ORDER — LIDOCAINE HCL 2 % EX GEL
1.0000 "application " | Freq: Once | CUTANEOUS | Status: AC
  Administered 2017-07-10: 1 via TOPICAL
  Filled 2017-07-10: qty 5

## 2017-07-10 NOTE — Progress Notes (Signed)
Report called to RN on 2W, patient will be transferred via wheelchair with charge nurse.

## 2017-07-10 NOTE — Progress Notes (Signed)
Family Medicine Teaching Service Daily Progress Note Intern Pager: [phone]  Patient name: Latoya [name] Medical record number: [id number] Date of birth: Oct 15, 1990 Age: 26 y.o. Gender: female  Primary Care Provider: Fanny Bien, MD Consultants: GI Code Status: full  Pt Overview and Major Events to Date:  Latoya [name] is a 26 y.o. female presenting with abdominal pain . PMH is significant for HSV infection, substance abuse, depression, anxiety and anemia.   Assessment and Plan: Latoya [name] is a 26 y.o. female presenting with abdominal pain . PMH is significant for HSV infection, substance abuse, depression, anxiety and anemia.   Crohn's vs UC: significant increase in expressed pain overnight,  -Follow-up with GI recommendations -soft diet, low residual -colonoscopy path pending -Percocet for pain (did offer 2x prn meds, consider IV in light of nausea) -Zofran for nausea -solumedrol -CT w/ contrast  Pancreatitis: confirmed via CT, likely gallstone related, liupase down to 294 -advancing diet to clears -pain control currently on morphine, will begin to titrate -decreased fluid to 150/hr after patient urinated frequently -will follow lipase -GI recs  Depression/Anxiety: Concerning for postpartum depression. She denies SI/HI. -cymbalta 30  Subclinical hyperthyroidism: TSH low, 0.121, but free T4 is WNL on 07/04/2017 -Outpatient follow-up  Concern for genital herpes: NEGATIVE  Iron deficiency Anemia: hemoglobin dropped to 8.5. Received 1 unit of blood 10/12. Has been fluctuating -Repeat CBC  Acute protein calorie malnutrition- 1.7 down from 3.7 end of Sept '18. Pre-albumin 11 (suggestive for malnutrition).  -nutrition consult and ordered supplementation  Substance use: UDS neg for anything but opioids. History of cocaine use  FEN/GI:  -clear liquid   Prophylaxis: SCD pending GI bleed and GI input.  Disposition: continue inpatient management  pending workup for abdominal pain and diarrhea  Subjective:  Patient says pain is much improved, she is hungry but worried about potential surgery for galbladder.   Understands plan to titrate pain meds.  Diarrhea resolved  Objective: Temp:  [97.3 F (36.3 C)-98 F (36.7 C)] 98 F (36.7 C) (10/15 2205) Pulse Rate:  [98-100] 100 (10/15 2205) Resp:  [18-20] 18 (10/15 2205) BP: (118-136)/(75-76) 118/76 (10/15 2205) SpO2:  [99 %-100 %] 99 % (10/15 2205) Physical Exam: General: moaning about pain, writhing on bed during exam.  Non-toxic appearing Cardiovascular: Patient could not hold still/quiet enough for good auscultation Respiratory: Patient could not hold still/quiet enough for good auscultation but no wheezing/IWB grossly noticeable Gastrointestinal: Soft,no distension. Very Tender to palpation diffusely. No rebound tenderness, no masses noted, pain is not localized to RUQ or RLQ. MSK: DP pulses strong. No LE edema Derm: Warm and dry. Neuro: Fluent speech. Alert, no gross neuro deficits noted Psych: Responds to questions appropriately but flat affect  Laboratory:  Recent Labs Lab 07/07/17 0554 07/08/17 0556 07/09/17 0623  WBC 7.8 15.3* 17.4*  HGB 7.7* 8.4* 9.8*  HCT 25.8* 28.2* 31.1*  PLT 707* 844* 1,048*    Recent Labs Lab 07/05/17 0732 07/06/17 0657  07/08/17 0556 07/09/17 0623 07/09/17 1035  NA 140 142  < > 138 138 137  K 3.3* 3.5  < > 4.0 3.5 3.3*  CL 104 108  < > 102 101 103  CO2 26 27  < > 28 25 24   BUN 9 6  < > <5* 8 8  CREATININE 0.76 0.78  < > 0.62 0.81 0.72  CALCIUM 8.2* 8.2*  < > 8.6* 8.7* 8.5*  PROT 6.1* 6.0*  --   --   --  6.2*  BILITOT 0.5 0.4  --   --   --  0.6  ALKPHOS 114 116  --   --   --  120  ALT 22 29  --   --   --  32  AST 17 24  --   --   --  18  GLUCOSE 100* 93  < > 92 132* 133*  < > = values in this interval not displayed.    Imaging/Diagnostic Tests: Dg Chest 2 View  Result Date: 07/04/2017 CLINICAL DATA:  Fever and chest and  abdominal pain EXAM: CHEST  2 VIEW COMPARISON:  Chest x-ray dated October 10, 2016 FINDINGS: The lungs are well-expanded and clear. The heart and pulmonary vascularity are normal. The mediastinum is normal in width. There is no pleural effusion. The gas pattern in the upper abdomen is normal. The bony thorax is unremarkable. IMPRESSION: There is no active cardiopulmonary disease. Electronically Signed   By: David  Martinique M.D.   On: 07/04/2017 08:57   Ct Abdomen Pelvis W Contrast  Result Date: 07/04/2017 CLINICAL DATA:  Lower abdominal pain and diarrhea for 1 month. EXAM: CT ABDOMEN AND PELVIS WITH CONTRAST TECHNIQUE: Multidetector CT imaging of the abdomen and pelvis was performed using the standard protocol following bolus administration of intravenous contrast. CONTRAST:  129m ISOVUE-300 IOPAMIDOL (ISOVUE-300) INJECTION 61% COMPARISON:  05/19/2011 CT abdomen/pelvis. FINDINGS: Lower chest: No significant pulmonary nodules or acute consolidative airspace disease. Hepatobiliary: Normal liver with no liver mass. Cholelithiasis, with no gallbladder wall thickening or pericholecystic fluid. No biliary ductal dilatation. Pancreas: Normal, with no mass or duct dilation. Spleen: Normal size. No mass. Adrenals/Urinary Tract: Normal adrenals. Normal kidneys with no hydronephrosis and no renal mass. Normal bladder. Stomach/Bowel: Grossly normal stomach. Normal caliber small bowel with no small bowel wall thickening. Appendix is within normal limits. There is diffuse mild-to-moderate colorectal wall thickening with associated pericolonic fat stranding without skip lesions. Vascular/Lymphatic: Normal caliber abdominal aorta. Patent portal, splenic, hepatic and renal veins. Mild right mesenteric adenopathy measuring up to 1.1 cm (series 3/ image 54), new. Reproductive: Grossly normal uterus.  No adnexal mass. Other: No pneumoperitoneum, ascites or focal fluid collection. Musculoskeletal: No aggressive appearing focal  osseous lesions. IMPRESSION: 1. Nonspecific infectious or inflammatory pancolitis, with the differential including inflammatory bowel disease. No obstruction, perforation, fistula or abscess. 2. Nonspecific mild right mesenteric adenopathy, probably reactive. 3. Cholelithiasis. No CT findings of acute cholecystitis. No biliary ductal dilatation. Electronically Signed   By: JIlona SorrelM.D.   On: 07/04/2017 10:16   BSherene Sires DO 07/10/2017, 3:08 AM PGY-3, CBayou GaucheIntern pager: [phone] text pages welcome

## 2017-07-10 NOTE — Progress Notes (Signed)
Daily Rounding Note  07/10/2017, 10:31 AM  LOS: 6 days   SUBJECTIVE:   Chief complaint: diarrhea with some blood.  No stools since 10/14.  Pain persists, not as severe as yesterday.  Poor appetite but no N/V. Urine output 200 cc recorded part of yesterday.  Nothing recorded yet today.  IVF of LR at 150/hour (per attending MD orders) .       OBJECTIVE:         Vital signs in last 24 hours:    Temp:  [97.7 F (36.5 C)-98 F (36.7 C)] 97.7 F (36.5 C) (10/16 0604) Pulse Rate:  [88-100] 88 (10/16 0604) Resp:  [18] 18 (10/16 0604) BP: (118-129)/(76-88) 129/88 (10/16 0604) SpO2:  [99 %] 99 % (10/16 0604) Weight:  [85.4 kg (188 lb 4.8 oz)] 85.4 kg (188 lb 4.8 oz) (10/16 0604) Last BM Date: 07/08/17 Filed Weights   07/10/17 0604  Weight: 85.4 kg (188 lb 4.8 oz)   General: calmer, not ill looking   Heart: RRR Chest: clear bil.   Abdomen: soft, ND.  BS hypoactive.  Tender in LLQ and bil UQs.  No guard or rebound  Extremities: no CCE Neuro/Psych:  Alert, oriented x 3.  Less depressed and more engaged but affect still flat.  Intake/Output from previous day: 10/15 0701 - 10/16 0700 In: 3745 [P.O.:120; I.V.:3625] Out: 200 [Urine:200]  Lab Results:  Recent Labs  07/08/17 0556 07/09/17 0623 07/10/17 0455  WBC 15.3* 17.4* 15.3*  HGB 8.4* 9.8* 8.5*  HCT 28.2* 31.1* 28.8*  PLT 844* 1,048* 806*   BMET  Recent Labs  07/09/17 0623 07/09/17 1035 07/10/17 0455  NA 138 137 136  K 3.5 3.3* 4.4  CL 101 103 102  CO2 25 24 28   GLUCOSE 132* 133* 87  BUN 8 8 9   CREATININE 0.81 0.72 0.59  CALCIUM 8.7* 8.5* 8.3*   LFT  Recent Labs  07/09/17 1035  PROT 6.2*  ALBUMIN 2.2*  AST 18  ALT 32  ALKPHOS 120  BILITOT 0.6   PT/INR No results for input(s): LABPROT, INR in the last 72 hours. Hepatitis Panel No results for input(s): HEPBSAG, HCVAB, HEPAIGM, HEPBIGM in the last 72 hours.  Studies/Results: Ct Abdomen  Pelvis W Contrast  Result Date: 07/09/2017 CLINICAL DATA:  Abdominal pain for 4 hours. EXAM: CT ABDOMEN AND PELVIS WITH CONTRAST TECHNIQUE: Multidetector CT imaging of the abdomen and pelvis was performed using the standard protocol following bolus administration of intravenous contrast. CONTRAST:  169m ISOVUE-300 IOPAMIDOL (ISOVUE-300) INJECTION 61% COMPARISON:  07/04/2017 FINDINGS: Lower chest: No acute abnormality. Hepatobiliary: No focal liver abnormality is seen. Tiny stones noted within the dependent portion of the gallbladder. Pancreas: There is marked diffuse edema of the pancreas with peripancreatic fluid compatible with acute pancreatitis. No evidence for pancreatic necrosis. Spleen: The spleen is normal. Adrenals/Urinary Tract: The adrenal glands are unremarkable. Unremarkable appearance of the left kidney peer small right renal calculus measures 2 mm, image 29 of series 3. No hydronephrosis. The urinary bladder appears normal for degree of distention. Stomach/Bowel: Stomach is normal. No dilated loops of small bowel. No pathologic dilatation of the colon. Mild edema involving the wall of the colon from the splenic flexure to the sigmoid colon noted compatible with residual colitis. No evidence for bowel perforation or abscess. No obstruction. The appendix is visualized and appears normal. Vascular/Lymphatic: Normal appearance of the abdominal aorta. No enlarged retroperitoneal or mesenteric adenopathy. No enlarged pelvic or  inguinal lymph nodes. Reproductive: Uterus and bilateral adnexa are unremarkable. Other: Fluid is identified within the lesser sac. Small volume of ascites noted within the pelvis. Musculoskeletal: No acute or significant osseous findings. IMPRESSION: 1. Exam positive for diffuse pancreatic edema and peripancreatic fluid compatible with acute pancreatitis. No pseudocyst identified and no evidence for pancreatic necrosis. 2. Gallstones. 3. Mild residual colitis from the splenic  flexure to the sigmoid colon. These results will be called to the ordering clinician or representative by the Radiologist Assistant, and communication documented in the PACS or zVision Dashboard. Electronically Signed   By: Kerby Moors M.D.   On: 07/09/2017 09:29   Scheduled Meds: . DULoxetine  30 mg Oral Daily  . feeding supplement  1 Container Oral TID BM  . liver oil-zinc oxide   Topical 5 X Daily  . magic mouthwash  5 mL Oral TID  . methylPREDNISolone (SOLU-MEDROL) injection  40 mg Intravenous Q12H  . multivitamin with minerals  1 tablet Oral Daily  . pantoprazole  40 mg Oral Daily   Continuous Infusions: . lactated ringers 200 mL/hr at 07/10/17 1042   PRN Meds:.calcium carbonate, diphenhydrAMINE, ketorolac, morphine injection, ondansetron **OR** ondansetron (ZOFRAN) IV   ASSESMENT:   *  Pan colitis.  ? UC.  Day 4 Solumdrol.  Diarrhea and abd pain improved  *  Acute pancreatitis, post admission. Suspect biliary, gallstones on CT.  WBCs imporved.    *  Depression.    *  Anemia.  Low iron and iron sat, normal ferritin.  Low ferritin in 12/2016.   *  Thrombocytosis, improved but persists   PLAN   *  Awaiting colon pathology should result today.     *  IVF to 200/hour.  Strict ins/outs.  Leave on clears.      Azucena Freed  07/10/2017, 10:31 AM Pager: [phone]   ________________________________________________________________________  Velora Heckler GI MD note:  I personally examined the patient, reviewed the data and agree with the assessment and plan described above.  Path from colon biopsies showed "active colitis with ulceration..no granulomas.  The differential diagnosis includes Crohn's."  The TI appears normal on CT and was not intubated during colonoscopy. Probably most appropriate that this be called "indeterminant colitis."  Just has some non-bloody loose stool. This is the first BM in 1-2 days. No bleeding in 3-4 days.  Obviously her colitis is healing, she  understands it will take weeks to months to completely recover.    Still has pan abdominal pains, from colon  And pancreas combination likely.    Agree she should stay on clears for now, maybe increase tomorrow to solids.  I am going to change to oral steroids (87m prednisone once daily).      DOwens Loffler MD LAnch[facility]ic[facility] LLCGastroenterology Pager [phone]

## 2017-07-11 LAB — CBC
HCT: 24.5 % — ABNORMAL LOW (ref 36.0–46.0)
Hemoglobin: 7.4 g/dL — ABNORMAL LOW (ref 12.0–15.0)
MCH: 24.7 pg — ABNORMAL LOW (ref 26.0–34.0)
MCHC: 30.2 g/dL (ref 30.0–36.0)
MCV: 81.9 fL (ref 78.0–100.0)
Platelets: 634 K/uL — ABNORMAL HIGH (ref 150–400)
RBC: 2.99 MIL/uL — ABNORMAL LOW (ref 3.87–5.11)
RDW: 16.2 % — ABNORMAL HIGH (ref 11.5–15.5)
WBC: 13.6 K/uL — ABNORMAL HIGH (ref 4.0–10.5)

## 2017-07-11 LAB — TROPONIN I: Troponin I: <0.03 ng/mL

## 2017-07-11 LAB — HEMOGLOBIN AND HEMATOCRIT, BLOOD
HCT: 27.6 % — ABNORMAL LOW (ref 36.0–46.0)
Hemoglobin: 8.1 g/dL — ABNORMAL LOW (ref 12.0–15.0)

## 2017-07-11 LAB — CREATININE, SERUM
Creatinine, Ser: 0.67 mg/dL (ref 0.44–1.00)
GFR calc Af Amer: >60 mL/min
GFR calc non Af Amer: >60 mL/min

## 2017-07-11 MED ORDER — MESALAMINE 1.2 G PO TBEC
4.8000 g | DELAYED_RELEASE_TABLET | Freq: Every day | ORAL | Status: DC
  Administered 2017-07-11: 4.8 g via ORAL
  Filled 2017-07-11 (×2): qty 4

## 2017-07-11 MED ORDER — METHYLPREDNISOLONE SODIUM SUCC 40 MG IJ SOLR
30.0000 mg | Freq: Two times a day (BID) | INTRAMUSCULAR | Status: DC
  Administered 2017-07-11 – 2017-07-13 (×5): 30 mg via INTRAVENOUS
  Filled 2017-07-11 (×5): qty 1

## 2017-07-11 MED ORDER — HYDROMORPHONE HCL 1 MG/ML IJ SOLN
1.0000 mg | INTRAMUSCULAR | Status: DC | PRN
  Administered 2017-07-11 – 2017-07-13 (×13): 1 mg via INTRAVENOUS
  Filled 2017-07-11 (×13): qty 1

## 2017-07-11 MED ORDER — LIDOCAINE HCL 2 % EX GEL
1.0000 "application " | Freq: Once | CUTANEOUS | Status: DC | PRN
  Filled 2017-07-11: qty 5

## 2017-07-11 MED ORDER — OXYCODONE HCL 5 MG PO TABS: 5.0000 mg | ORAL_TABLET | ORAL | Status: DC | PRN

## 2017-07-11 MED ORDER — HYDROMORPHONE HCL 1 MG/ML IJ SOLN
0.5000 mg | Freq: Once | INTRAMUSCULAR | Status: AC
  Administered 2017-07-11: 0.5 mg via INTRAVENOUS
  Filled 2017-07-11: qty 0.5

## 2017-07-11 NOTE — Progress Notes (Signed)
Family Medicine Teaching Service Daily Progress Note Intern Pager: [phone]  Patient name: Latoya [name] Medical record number: [id number] Date of birth: [birth year] Age: 26 y.o. Gender: female  Primary Care Provider: Fanny Bien, MD Consultants: GI Code Status: full  Pt Overview and Major Events to Date:  Latoya [name] is a 26 y.o. female presenting with abdominal pain . PMH is significant for HSV infection, substance abuse, depression, anxiety and anemia.   Assessment and Plan: Latoya [name] is a 26 y.o. female presenting with abdominal pain . PMH is significant for HSV infection, substance abuse, depression, anxiety and anemia.   Indeterminate inflammatory bowel: significant increase in expressed pain overnight. Path notes collitis w/ ulceration but no granulomas -Follow-up with GI recommendations -no toradol per GI due to irritation -12m dilaudid q4prn per GI -Zofran for nausea -solumedrol for 24-48hrs, dc'd prednisone -mesalamine -hep serology ordered -TB quanaferon ordered  Pancreatitis: confirmed via CT, likely gallstone related -advancing diet to full liquid per GI -pain control currently on morphine, will begin to titrate -LR 2061mhr per GI -GI recs  Depression/Anxiety: Concerning for postpartum depression. She denies SI/HI. -cymbalta 30  Subclinical hyperthyroidism: TSH low, 0.121, but free T4 is WNL on 07/04/2017 -Outpatient follow-up  Concern for genital herpes: NEGATIVE  Iron deficiency Anemia: hemoglobin dropped to 8.5. Received 1 unit of blood 10/12. Has been fluctuating -Repeat CBC  Acute protein calorie malnutrition (moderate)- 1.7 down from 3.7 end of Sept '18. Pre-albumin 11 (suggestive for malnutrition).  -nutrition consult and ordered supplementation  Substance use: UDS neg for anything but opioids. History of cocaine use  FEN/GI:  -clear liquid   Prophylaxis: SCD pending GI bleed and GI input.  Disposition: continue  inpatient management pending workup for abdominal pain and diarrhea  Subjective:  Noted some blood in loose BM last night, pain consistent with yesterday.  She has a very flat affect.  Would like to go home but does not think she is well enough yet  Objective: Temp:  [98.4 F (36.9 C)-98.5 F (36.9 C)] 98.4 F (36.9 C) (10/17 0444) Pulse Rate:  [86-105] 105 (10/17 0444) Resp:  [16-18] 16 (10/17 0444) BP: (120-129)/(69-80) 129/80 (10/17 0444) SpO2:  [96 %-99 %] 99 % (10/17 0444) Weight:  [185 lb 10 oz (84.2 kg)] 185 lb 10 oz (84.2 kg) (10/17 0144) Physical Exam: General: moaning about pain, writhing on bed during exam.  Non-toxic appearing Cardiovascular: Patient could not hold still/quiet enough for good auscultation Respiratory: Patient could not hold still/quiet enough for good auscultation but no wheezing/IWB grossly noticeable Gastrointestinal: Soft,no distension. Very Tender to palpation diffusely. No rebound tenderness, no masses noted, pain is not localized to RUQ or RLQ. MSK: DP pulses strong. No LE edema Derm: Warm and dry. Neuro: Fluent speech. Alert, no gross neuro deficits noted Psych: Responds to questions appropriately but flat affect  Laboratory:  Recent Labs Lab 07/08/17 0556 07/09/17 0623 07/10/17 0455  WBC 15.3* 17.4* 15.3*  HGB 8.4* 9.8* 8.5*  HCT 28.2* 31.1* 28.8*  PLT 844* 1,048* 806*    Recent Labs Lab 07/05/17 0732 07/06/17 0657  07/09/17 0623 07/09/17 1035 07/10/17 0455  NA 140 142  < > 138 137 136  K 3.3* 3.5  < > 3.5 3.3* 4.4  CL 104 108  < > 101 103 102  CO2 26 27  < > 25 24 28   BUN 9 6  < > 8 8 9   CREATININE 0.76 0.78  < > 0.81 0.72 0.59  CALCIUM  8.2* 8.2*  < > 8.7* 8.5* 8.3*  PROT 6.1* 6.0*  --   --  6.2*  --   BILITOT 0.5 0.4  --   --  0.6  --   ALKPHOS 114 116  --   --  120  --   ALT 22 29  --   --  32  --   AST 17 24  --   --  18  --   GLUCOSE 100* 93  < > 132* 133* 87  < > = values in this interval not  displayed.    Imaging/Diagnostic Tests: Dg Chest 2 View  Result Date: 07/04/2017 CLINICAL DATA:  Fever and chest and abdominal pain EXAM: CHEST  2 VIEW COMPARISON:  Chest x-ray dated October 10, 2016 FINDINGS: The lungs are well-expanded and clear. The heart and pulmonary vascularity are normal. The mediastinum is normal in width. There is no pleural effusion. The gas pattern in the upper abdomen is normal. The bony thorax is unremarkable. IMPRESSION: There is no active cardiopulmonary disease. Electronically Signed   By: David  Martinique M.D.   On: 07/04/2017 08:57   Ct Abdomen Pelvis W Contrast  Result Date: 07/04/2017 CLINICAL DATA:  Lower abdominal pain and diarrhea for 1 month. EXAM: CT ABDOMEN AND PELVIS WITH CONTRAST TECHNIQUE: Multidetector CT imaging of the abdomen and pelvis was performed using the standard protocol following bolus administration of intravenous contrast. CONTRAST:  132m ISOVUE-300 IOPAMIDOL (ISOVUE-300) INJECTION 61% COMPARISON:  05/19/2011 CT abdomen/pelvis. FINDINGS: Lower chest: No significant pulmonary nodules or acute consolidative airspace disease. Hepatobiliary: Normal liver with no liver mass. Cholelithiasis, with no gallbladder wall thickening or pericholecystic fluid. No biliary ductal dilatation. Pancreas: Normal, with no mass or duct dilation. Spleen: Normal size. No mass. Adrenals/Urinary Tract: Normal adrenals. Normal kidneys with no hydronephrosis and no renal mass. Normal bladder. Stomach/Bowel: Grossly normal stomach. Normal caliber small bowel with no small bowel wall thickening. Appendix is within normal limits. There is diffuse mild-to-moderate colorectal wall thickening with associated pericolonic fat stranding without skip lesions. Vascular/Lymphatic: Normal caliber abdominal aorta. Patent portal, splenic, hepatic and renal veins. Mild right mesenteric adenopathy measuring up to 1.1 cm (series 3/ image 54), new. Reproductive: Grossly normal uterus.  No  adnexal mass. Other: No pneumoperitoneum, ascites or focal fluid collection. Musculoskeletal: No aggressive appearing focal osseous lesions. IMPRESSION: 1. Nonspecific infectious or inflammatory pancolitis, with the differential including inflammatory bowel disease. No obstruction, perforation, fistula or abscess. 2. Nonspecific mild right mesenteric adenopathy, probably reactive. 3. Cholelithiasis. No CT findings of acute cholecystitis. No biliary ductal dilatation. Electronically Signed   By: JIlona SorrelM.D.   On: 07/04/2017 10:16   BSherene Sires DO 07/11/2017, 7:07 AM PGY-3, CSuissevaleIntern pager: [phone] text pages welcome

## 2017-07-11 NOTE — Progress Notes (Signed)
C/o having bm with blood in it. Looked in toilet and no blood seen  Pt does have external hemorrhoids that she is apply cream for.

## 2017-07-11 NOTE — Progress Notes (Signed)
Patient refused bed alarm. Educated patient. Will continue to monitor.

## 2017-07-11 NOTE — Progress Notes (Signed)
Pt refused vitals, [place] notified

## 2017-07-11 NOTE — Progress Notes (Signed)
Patient complaining of severe pain 10/10 to LLQ. Paged Dr Cyndia Skeeters, Bretta Bang . Awaiting response

## 2017-07-11 NOTE — Progress Notes (Signed)
Patient refused vital signs. Notified Dr Bretta Bang.

## 2017-07-11 NOTE — Progress Notes (Signed)
Patient had blood BM. MD at bedside and aware. Will continue to monitor.

## 2017-07-11 NOTE — Progress Notes (Signed)
FPTS Interim Progress Note Went to evaluate patient in response to the page I received from patient's nurse. Per RN,  patient complains about chest pain, abdominal pain and back pain. When I arrived in her room, patient was in the rest room. When I returned, she was back on her bed.  She reports left-sided chest pain, stomach pain and back pain. She states pain has been there and hasn't gone away since this morning. Abdominal pain is diffuse. She also reports bloody bowel movement. Per RN, she stopped the IV fluid because it is making her go to the bathroom frequently.  O: BP 115/71 (BP Location: Left Arm)   Pulse 98   Temp 98.3 F (36.8 C) (Oral)   Resp 16   Ht 5' 9"  (1.753 m)   Wt 185 lb 10 oz (84.2 kg)   LMP  (LMP Unknown)   SpO2 96%   BMI 27.41 kg/m   GEN: appears to be in pain CVS: RRR, nl s1 & s2, no murmurs, no edema RESP: no IWOB, good air movement bilaterally, CTAB GI: BS present & normal, soft. Mild tenderness to deep palpation diffusely. No rebound NEURO: alert and oiented appropriately, no gross deficits  PSYCH: flat affect  A/P: Chest, abdominal and back pain. Unchanged from this morning per patient.  -Chek EKG and troponin once although ACS is very unlikely -Continue Dilaudid 1 mg every 4 hours as needed for pain -Will hold off repeating CT now -We'll check H&H. Hemoglobin has been trending down   Mercy Riding, MD 07/11/2017, 4:20 PM PGY-3, [place] [facility] pager [phone]

## 2017-07-11 NOTE — Progress Notes (Signed)
[facility] Progress Note    Since last GI note: I changed her to oral steroids last night.  She has started to see blood in stool again twice overnight. 4 BMs total in 24 hours.  She is emotional, tired of being sick  Objective: Vital signs in last 24 hours: Temp:  [98.4 F (36.9 C)-98.5 F (36.9 C)] 98.4 F (36.9 C) (10/17 0444) Pulse Rate:  [86-105] 105 (10/17 0444) Resp:  [16-18] 16 (10/17 0444) BP: (120-129)/(69-80) 129/80 (10/17 0444) SpO2:  [96 %-99 %] 99 % (10/17 0444) Weight:  [185 lb 10 oz (84.2 kg)] 185 lb 10 oz (84.2 kg) (10/17 0144) Last BM Date: 07/08/17 General: alert and oriented times 3 Heart: regular rate and rythm Abdomen: soft, mildly tender throughout, non-distended, normal bowel sounds   Lab Results:  Recent Labs  07/09/17 0623 07/10/17 0455  WBC 17.4* 15.3*  HGB 9.8* 8.5*  PLT 1,048* 806*  MCV 81.0 82.3    Recent Labs  07/09/17 0623 07/09/17 1035 07/10/17 0455  NA 138 137 136  K 3.5 3.3* 4.4  CL 101 103 102  CO2 25 24 28   GLUCOSE 132* 133* 87  BUN 8 8 9   CREATININE 0.81 0.72 0.59  CALCIUM 8.7* 8.5* 8.3*    Recent Labs  07/09/17 1035  PROT 6.2*  ALBUMIN 2.2*  AST 18  ALT 32  ALKPHOS 120  BILITOT 0.6   Studies/Results: Ct Abdomen Pelvis W Contrast  Result Date: 07/09/2017 CLINICAL DATA:  Abdominal pain for 4 hours. EXAM: CT ABDOMEN AND PELVIS WITH CONTRAST TECHNIQUE: Multidetector CT imaging of the abdomen and pelvis was performed using the standard protocol following bolus administration of intravenous contrast. CONTRAST:  196m ISOVUE-300 IOPAMIDOL (ISOVUE-300) INJECTION 61% COMPARISON:  07/04/2017 FINDINGS: Lower chest: No acute abnormality. Hepatobiliary: No focal liver abnormality is seen. Tiny stones noted within the dependent portion of the gallbladder. Pancreas: There is marked diffuse edema of the pancreas with peripancreatic fluid compatible with acute pancreatitis. No evidence for pancreatic necrosis.  Spleen: The spleen is normal. Adrenals/Urinary Tract: The adrenal glands are unremarkable. Unremarkable appearance of the left kidney peer small right renal calculus measures 2 mm, image 29 of series 3. No hydronephrosis. The urinary bladder appears normal for degree of distention. Stomach/Bowel: Stomach is normal. No dilated loops of small bowel. No pathologic dilatation of the colon. Mild edema involving the wall of the colon from the splenic flexure to the sigmoid colon noted compatible with residual colitis. No evidence for bowel perforation or abscess. No obstruction. The appendix is visualized and appears normal. Vascular/Lymphatic: Normal appearance of the abdominal aorta. No enlarged retroperitoneal or mesenteric adenopathy. No enlarged pelvic or inguinal lymph nodes. Reproductive: Uterus and bilateral adnexa are unremarkable. Other: Fluid is identified within the lesser sac. Small volume of ascites noted within the pelvis. Musculoskeletal: No acute or significant osseous findings. IMPRESSION: 1. Exam positive for diffuse pancreatic edema and peripancreatic fluid compatible with acute pancreatitis. No pseudocyst identified and no evidence for pancreatic necrosis. 2. Gallstones. 3. Mild residual colitis from the splenic flexure to the sigmoid colon. These results will be called to the ordering clinician or representative by the Radiologist Assistant, and communication documented in the PACS or zVision Dashboard. Electronically Signed   By: TKerby MoorsM.D.   On: 07/09/2017 09:29     Medications: Scheduled Meds: . DULoxetine  30 mg Oral Daily  . feeding supplement  1 Container Oral TID BM  . liver oil-zinc oxide   Topical 5 X  Daily  . magic mouthwash  5 mL Oral TID  . multivitamin with minerals  1 tablet Oral Daily  . pantoprazole  40 mg Oral Daily  . predniSONE  40 mg Oral QAC breakfast   Continuous Infusions: . lactated ringers Stopped (07/11/17 0521)   PRN Meds:.calcium carbonate,  diphenhydrAMINE, ketorolac, ondansetron **OR** ondansetron (ZOFRAN) IV, oxyCODONE    Assessment/Plan: 26 y.o. female with colitis, pancreatitis  Severe, indeterminant colitis.  Likely new presentation of IBD (UC vs Crohn's).  No TI involvement on CT, no TI intubation on colonoscopy.  She is responding well to steroids.  Changed to oral prednisone last night but that may have been a bit too soon since she started to see blood in her stools again overnight. Still overall much better than at presentation. I'm going to change her back to solumedrol for another 24-48 hours. NSAIDs (such as her every 6 hour toradol) can cause colitis to worsen.  I know she has had drug abuse problems in the past but I favor narcotic pain meds for her and will change to that now.  I spoke with the nursing staff about it as well. Will also start oral mesalamine now (4 pills lialda once daily). Will check TB, Hepatitis serologies in case she eventually needs biologic (seems a good chance given such severe presentation).   Acute pancreatitis, during this admission.  Has gallstones in GB and so that is likely etiology; not drinking, lipid panel normal, none of her meds list panc as ADRs.  Hard to tell if her abd pain is from pancreatitis vs severe colitis. Probably a combination.  I'm going to advance to full liquid trial today. Toradol can worsen colitis, will d/c this for now.    Milus Banister, MD  07/11/2017, 7:25 AM [facility] Pager [phone]

## 2017-07-12 DIAGNOSIS — O99345 Other mental disorders complicating the puerperium: Secondary | ICD-10-CM

## 2017-07-12 DIAGNOSIS — K859 Acute pancreatitis without necrosis or infection, unspecified: Secondary | ICD-10-CM

## 2017-07-12 DIAGNOSIS — F53 Postpartum depression: Secondary | ICD-10-CM | POA: Diagnosis present

## 2017-07-12 DIAGNOSIS — K529 Noninfective gastroenteritis and colitis, unspecified: Secondary | ICD-10-CM

## 2017-07-12 DIAGNOSIS — F1410 Cocaine abuse, uncomplicated: Secondary | ICD-10-CM

## 2017-07-12 DIAGNOSIS — F1910 Other psychoactive substance abuse, uncomplicated: Secondary | ICD-10-CM

## 2017-07-12 LAB — CBC
HCT: 22.6 % — ABNORMAL LOW (ref 36.0–46.0)
HCT: 28.4 % — ABNORMAL LOW (ref 36.0–46.0)
Hemoglobin: 6.6 g/dL — CL (ref 12.0–15.0)
Hemoglobin: 9 g/dL — ABNORMAL LOW (ref 12.0–15.0)
MCH: 23.7 pg — ABNORMAL LOW (ref 26.0–34.0)
MCH: 25.4 pg — ABNORMAL LOW (ref 26.0–34.0)
MCHC: 29.2 g/dL — ABNORMAL LOW (ref 30.0–36.0)
MCHC: 31.7 g/dL (ref 30.0–36.0)
MCV: 81 fL (ref 78.0–100.0)
MCV: 80.2 fL (ref 78.0–100.0)
Platelets: 536 10*3/uL — ABNORMAL HIGH (ref 150–400)
Platelets: 442 10*3/uL — ABNORMAL HIGH (ref 150–400)
RBC: 2.79 MIL/uL — ABNORMAL LOW (ref 3.87–5.11)
RBC: 3.54 MIL/uL — ABNORMAL LOW (ref 3.87–5.11)
RDW: 16 % — ABNORMAL HIGH (ref 11.5–15.5)
RDW: 15.5 % (ref 11.5–15.5)
WBC: 11.9 10*3/uL — ABNORMAL HIGH (ref 4.0–10.5)
WBC: 14.3 10*3/uL — ABNORMAL HIGH (ref 4.0–10.5)

## 2017-07-12 LAB — PREPARE RBC (CROSSMATCH)

## 2017-07-12 LAB — BASIC METABOLIC PANEL
Anion gap: 11 (ref 5–15)
BUN: 8 mg/dL (ref 6–20)
CO2: 23 mmol/L (ref 22–32)
Calcium: 8.1 mg/dL — ABNORMAL LOW (ref 8.9–10.3)
Chloride: 99 mmol/L — ABNORMAL LOW (ref 101–111)
Creatinine, Ser: 0.64 mg/dL (ref 0.44–1.00)
GFR calc Af Amer: >60 mL/min (ref >60)
GFR calc non Af Amer: >60 mL/min (ref >60)
Glucose, Bld: 83 mg/dL (ref 65–99)
Potassium: 3.9 mmol/L (ref 3.5–5.1)
Sodium: 133 mmol/L — ABNORMAL LOW (ref 135–145)

## 2017-07-12 LAB — CALPROTECTIN, FECAL: Calprotectin, Fecal: 420 ug/g — ABNORMAL HIGH (ref 0–120)

## 2017-07-12 MED ORDER — SODIUM CHLORIDE 0.9 % IV SOLN: Freq: Once | INTRAVENOUS | Status: DC

## 2017-07-12 MED ORDER — DULOXETINE HCL 20 MG PO CPEP
40.0000 mg | ORAL_CAPSULE | Freq: Every day | ORAL | Status: DC
  Administered 2017-07-13: 40 mg via ORAL
  Filled 2017-07-12: qty 2

## 2017-07-12 NOTE — Progress Notes (Signed)
CRITICAL VALUE ALERT  Critical Value:  Hemoglobin 6.6  Date & Time Notied:  07/12/2017 0542 Provider Notified: Dr Wendee Beavers  Orders Received/Actions taken: orders received for  type and screen  ,1unit PRBC

## 2017-07-12 NOTE — Progress Notes (Signed)
Patient refused for IV fluids to be restarted. Dr Cyndia Skeeters informed

## 2017-07-12 NOTE — Progress Notes (Signed)
Family Medicine Teaching Service Daily Progress Note Intern Pager: [phone]  Patient name: Latoya [name] Medical record number: [id number] Date of birth: May 30, 1991 Age: 26 y.o. Gender: female  Primary Care Provider: Fanny Bien, MD Consultants: GI Code Status: full  Pt Overview and Major Events to Date:  LEXANDRA [name] is a 26 y.o. female presenting with abdominal pain . PMH is significant for HSV infection, substance abuse, depression, anxiety and anemia.   Anemia requiring a total of 2u transfusion.  Assessment and Plan: Latoya [name] is a 26 y.o. female presenting with abdominal pain . PMH is significant for HSV infection, substance abuse, depression, anxiety and anemia.   Indeterminate inflammatory bowel: significant increase in expressed pain overnight. Path notes collitis w/ ulceration but no granulomas -Follow-up with GI recommendations -no toradol per GI due to irritation -12m dilaudid q4prn per GI -Zofran for nausea -solumedrol for 24-48hrs, dc'd prednisone -mesalamine -hep serology ordered -TB quanaferon ordered  Pancreatitis: confirmed via CT, likely gallstone related -npo -pain control currently on morphine, will begin to titrate -LR 2047mhr per GI -GI recs  Iron deficiency Anemia: hemoglobin dropped to 6.6. Transfusing AM 10/18, also received 1 unit of blood 10/12. Has been fluctuating -Repeat h/h after transfusion  Depression/Anxiety: Concerning for postpartum depression. She denies SI/HI. -cymbalta 30  Subclinical hyperthyroidism: TSH low, 0.121, but free T4 is WNL on 07/04/2017 -Outpatient follow-up  Concern for genital herpes: NEGATIVE  Acute protein calorie malnutrition (moderate)- 1.7 down from 3.7 end of Sept '18. Pre-albumin 11 (suggestive for malnutrition).   Patient consistently was refusing boost supplements -nutrition consult and ordered supplementation  Substance use: UDS neg for anything but opioids. History of cocaine  use  FEN/GI:  -npo  Prophylaxis: SCD pending GI bleed and GI input.  Disposition: continue inpatient management pending workup for abdominal pain and diarrhea  Subjective:  Patient uncomfrotable but not in dramatic pain.   Still complaining of diarrhea w/ blood.   Wanted food but understood NPO.   Mom was also on phone during exam and I answered her questions with the patient's permission  Objective: Temp:  [98 F (36.7 C)-98.3 F (36.8 C)] 98.1 F (36.7 C) (10/17 2301) Pulse Rate:  [98-101] 100 (10/17 2301) Resp:  [16-18] 16 (10/17 2301) BP: (115-138)/(70-85) 131/70 (10/17 2301) SpO2:  [96 %-100 %] 97 % (10/17 2301) Physical Exam: General: uncomfortable.  Non-toxic appearing Cardiovascular: RRR, no murmurs noted Respiratory: CTAB, no wheezing noted, no IWB Gastrointestinal: Soft,no distension. Slightly Tender to palpation diffusely. No rebound tenderness, no masses noted, pain is not localized to RUQ or RLQ. MSK: DP pulses strong. No LE edema Derm: Warm and dry. Neuro: Fluent speech. Alert, no gross neuro deficits noted Psych: Responds to questions appropriately but flat affect  Laboratory:  Recent Labs Lab 07/10/17 0455 07/11/17 0703 07/11/17 1610 07/12/17 0401  WBC 15.3* 13.6*  --  11.9*  HGB 8.5* 7.4* 8.1* 6.6*  HCT 28.8* 24.5* 27.6* 22.6*  PLT 806* 634*  --  536*    Recent Labs Lab 07/05/17 0732 07/06/17 0657  07/09/17 1035 07/10/17 0455 07/11/17 0703 07/12/17 0401  NA 140 142  < > 137 136  --  133*  K 3.3* 3.5  < > 3.3* 4.4  --  3.9  CL 104 108  < > 103 102  --  99*  CO2 26 27  < > 24 28  --  23  BUN 9 6  < > 8 9  --  8  CREATININE  0.76 0.78  < > 0.72 0.59 0.67 0.64  CALCIUM 8.2* 8.2*  < > 8.5* 8.3*  --  8.1*  PROT 6.1* 6.0*  --  6.2*  --   --   --   BILITOT 0.5 0.4  --  0.6  --   --   --   ALKPHOS 114 116  --  120  --   --   --   ALT 22 29  --  32  --   --   --   AST 17 24  --  18  --   --   --   GLUCOSE 100* 93  < > 133* 87  --  83  < > =  values in this interval not displayed.    Imaging/Diagnostic Tests: Dg Chest 2 View  Result Date: 07/04/2017 CLINICAL DATA:  Fever and chest and abdominal pain EXAM: CHEST  2 VIEW COMPARISON:  Chest x-ray dated October 10, 2016 FINDINGS: The lungs are well-expanded and clear. The heart and pulmonary vascularity are normal. The mediastinum is normal in width. There is no pleural effusion. The gas pattern in the upper abdomen is normal. The bony thorax is unremarkable. IMPRESSION: There is no active cardiopulmonary disease. Electronically Signed   By: David  Martinique M.D.   On: 07/04/2017 08:57   Ct Abdomen Pelvis W Contrast  Result Date: 07/04/2017 CLINICAL DATA:  Lower abdominal pain and diarrhea for 1 month. EXAM: CT ABDOMEN AND PELVIS WITH CONTRAST TECHNIQUE: Multidetector CT imaging of the abdomen and pelvis was performed using the standard protocol following bolus administration of intravenous contrast. CONTRAST:  162m ISOVUE-300 IOPAMIDOL (ISOVUE-300) INJECTION 61% COMPARISON:  05/19/2011 CT abdomen/pelvis. FINDINGS: Lower chest: No significant pulmonary nodules or acute consolidative airspace disease. Hepatobiliary: Normal liver with no liver mass. Cholelithiasis, with no gallbladder wall thickening or pericholecystic fluid. No biliary ductal dilatation. Pancreas: Normal, with no mass or duct dilation. Spleen: Normal size. No mass. Adrenals/Urinary Tract: Normal adrenals. Normal kidneys with no hydronephrosis and no renal mass. Normal bladder. Stomach/Bowel: Grossly normal stomach. Normal caliber small bowel with no small bowel wall thickening. Appendix is within normal limits. There is diffuse mild-to-moderate colorectal wall thickening with associated pericolonic fat stranding without skip lesions. Vascular/Lymphatic: Normal caliber abdominal aorta. Patent portal, splenic, hepatic and renal veins. Mild right mesenteric adenopathy measuring up to 1.1 cm (series 3/ image 54), new. Reproductive:  Grossly normal uterus.  No adnexal mass. Other: No pneumoperitoneum, ascites or focal fluid collection. Musculoskeletal: No aggressive appearing focal osseous lesions. IMPRESSION: 1. Nonspecific infectious or inflammatory pancolitis, with the differential including inflammatory bowel disease. No obstruction, perforation, fistula or abscess. 2. Nonspecific mild right mesenteric adenopathy, probably reactive. 3. Cholelithiasis. No CT findings of acute cholecystitis. No biliary ductal dilatation. Electronically Signed   By: JIlona SorrelM.D.   On: 07/04/2017 10:16   BSherene Sires DO 07/12/2017, 6:07 AM PGY-3, [place] pager: [phone] text pages welcome

## 2017-07-12 NOTE — Progress Notes (Signed)
Paged the doctor regarding NPO with sips with meds, current order is NPO, waiting for call back

## 2017-07-12 NOTE — Progress Notes (Signed)
[facility] Progress Note    Since last GI note: She reports 7 bloody diarrhea stools in the past 24 hours. Abdominal pain difficult to control Toradol stopped yesterday (NSAIDs can make IBD worse).  Mesalamine started yesterday.  Objective: Vital signs in last 24 hours: Temp:  [98 F (36.7 C)-98.1 F (36.7 C)] 98 F (36.7 C) (10/18 0843) Pulse Rate:  [88-101] 88 (10/18 0843) Resp:  [16-18] 16 (10/18 0843) BP: (128-140)/(70-85) 140/81 (10/18 0843) SpO2:  [97 %-100 %] 100 % (10/18 0843) Last BM Date: 07/11/17 General: alert and oriented times 3 Heart: regular rate and rythm Abdomen: soft, mild to moderately tender throughout,  non-distended, normal bowel sounds   Lab Results:  Recent Labs  07/10/17 0455 07/11/17 0703 07/11/17 1610 07/12/17 0401  WBC 15.3* 13.6*  --  11.9*  HGB 8.5* 7.4* 8.1* 6.6*  PLT 806* 634*  --  536*  MCV 82.3 81.9  --  81.0    Recent Labs  07/09/17 1035 07/10/17 0455 07/11/17 0703 07/12/17 0401  NA 137 136  --  133*  K 3.3* 4.4  --  3.9  CL 103 102  --  99*  CO2 24 28  --  23  GLUCOSE 133* 87  --  83  BUN 8 9  --  8  CREATININE 0.72 0.59 0.67 0.64  CALCIUM 8.5* 8.3*  --  8.1*    Recent Labs  07/09/17 1035  PROT 6.2*  ALBUMIN 2.2*  AST 18  ALT 32  ALKPHOS 120  BILITOT 0.6    Medications: Scheduled Meds: . DULoxetine  30 mg Oral Daily  . feeding supplement  1 Container Oral TID BM  . liver oil-zinc oxide   Topical 5 X Daily  . magic mouthwash  5 mL Oral TID  . mesalamine  4.8 g Oral Q breakfast  . methylPREDNISolone (SOLU-MEDROL) injection  30 mg Intravenous Q12H  . multivitamin with minerals  1 tablet Oral Daily  . pantoprazole  40 mg Oral Daily   Continuous Infusions: . sodium chloride    . lactated ringers 200 mL/hr at 07/12/17 0626   PRN Meds:.calcium carbonate, diphenhydrAMINE, HYDROmorphone (DILAUDID) injection, lidocaine, ondansetron **OR** ondansetron (ZOFRAN) IV    Assessment/Plan: 26 y.o.  female with severe indeterminant colitis, acute pancreatitis  She's back on IV steroids and 24 hour trial on oral. I started mesalamine orally yesterday but bloody diarrhea seems worse today and since mesalamine can actually worsen colitis I'm stopping that now.  Her Hb has dropped to 6.6.  She needs blood transfusion which I will order this morning (2 units).  Acute pancreatitis makes her care more challenging. NPO,  IV fluids, pain control should continue.  Possibly gallstone related, possibly 'reactive' to nearby sick colon?    If she doesn't start to improving clinically in next 3-4 days of IV steroids, will likely start biologics (remicade).    Milus Banister, MD  07/12/2017, 9:13 AM [facility] Pager [phone]

## 2017-07-12 NOTE — Consult Note (Signed)
[facility] Face-to-Face Psychiatry Consult   Reason for Consult:  Post partum depression and substance abuse Referring Physician:  Dr. Erin Hearing Patient Identification: Latoya [name] MRN:  [id number] Principal Diagnosis: Post partum depression Diagnosis:   Patient Active Problem List   Diagnosis Date Noted  . Iron deficiency anemia due to chronic blood loss [D50.0]   . Heme positive stool [R19.5]   . Pancolitis ([place]) [K51.00] 07/04/2017  . Generalized abdominal pain [R10.84]   . Diarrhea [R19.7]   . Major depressive disorder, recurrent severe without psychotic features ([place]) [F33.2] 06/29/2017  . Cocaine abuse ([place]) [F14.10] 06/29/2017  . SVD (spontaneous vaginal delivery) [O80] 05/15/2017  . Crack cocaine use [F14.90] 05/15/2017  . Cervical dysplasia [N87.9] 01/16/2017  . Obesity (BMI 30.0-34.9) [E66.9] 01/12/2017  . History of substance abuse [Z87.898] 01/12/2017  . History of gestational diabetes mellitus (GDM) [Z86.32] 01/12/2017  . History of gestational hypertension [Z87.59] 01/12/2017  . History of macrosomia in infant in prior pregnancy, currently pregnant [O09.299] 01/12/2017  . Short interval between pregnancies affecting pregnancy in second trimester, antepartum [O09.892] 01/12/2017  . Late prenatal care [O09.30] 01/12/2017  . Paxil use in early pregnancy [Z79.899] 01/12/2017  . History of suicide attempt [Z91.5] 01/12/2017  . Anxiety and depression [F41.9, F32.9] 01/12/2017  . Supervision of high risk pregnancy, antepartum [O09.90] 01/10/2017  . GDM (gestational diabetes mellitus) [O24.419] 02/25/2016  . Obesity in pregnancy [O99.210] 02/25/2016    Total Time spent with patient: 1 hour  Subjective:   Latoya [name] is a 26 y.o. female patient admitted with abdominal pain.  HPI:  Latoya [name] is a 26 y.o. female with PMH of depression, anxiety, substance abuse (cocaine), genital herpes who is presenting with 1 month history of diffuse abdominal pain and bloody  diarrhea. The patient also reports a 1 month history of multiple loose bloody stools (7-10 per day) and 2 days of nausea and vomiting. Since that time the patient reports exacerbated abdominal pain and developing a fever as high as 101F and tachycardia to 140-150s  today at a visit in [facility] hospital. She came in for further evaluation. Currently patient describes her abdominal pain as consistent and sharp "all over". She also reports associated diffuse chest pain which she states is "all over" and exacerbated by vomiting. She denies pain or tingling in jaw or arms. She has no alleviating factors for her abdominal pain. She states use of pain medication which she does not recall the name. Patient reports associated loose stools, 7 yesterday with a small amount of blood in her stool. She does note history of external hemorrhoids since the birth of her second child 6 weeks ago. Patient does not recall when she last took her current medications. Of notes, patient has recent diagnosis of herpes and reports associated dysuria with this. She was prescribed Valtrex on 06/26/2017.  Patient seen, chart reviewed for this face-to-face psychiatric consultation and evaluation of postpartum depression. Patient stated she was admitted to the hospital with the pancolitis and bleeding secondary to hemorrhoids and required blood transfusion. Patient also has polysubstance abuse especially cocaine and heroin. Patient reported her last use of heroin was 8 months ago and relapsed on cocaine about 2 weeks ago. Patient also reportedly received a medication management for postpartum depression from OB/GYN and primary care physician. Patient reported she has been receiving medication management since she had a first child about a year ago. Patient continued to be depressed with the constricted affect, lack of motivation decreased psychomotor activity. Patient  denied suicidal/homicidal ideation, intention or plans. Patient has no evidence  of auditory/visual hallucinations, paranoid delusions. Patient has no withdrawal symptoms of opiates or benzo's. Patient agreed to adjust her Cymbalta to 40 mg starting tomorrow for controlling her symptoms of depression.   Past Psychiatric History: Depression, anxiety and substance abuse (opioids and cocaine). She has no history of acute psychiatric hospitalization.  Risk to Self: Is patient at risk for suicide?: No Risk to Others:   Prior Inpatient Therapy:   Prior Outpatient Therapy:    Past Medical History:  Past Medical History:  Diagnosis Date  . Anemia   . Anxiety   . Depression   . Drug-seeking behavior   . Gestational diabetes   . History of substance abuse   . HSV infection   . Pregnancy induced hypertension     Past Surgical History:  Procedure Laterality Date  . COLONOSCOPY N/A 07/07/2017   Procedure: COLONOSCOPY;  Surgeon: Juanita Craver, MD;  Location: [facility] ENDOSCOPY;  Service: Endoscopy;  Laterality: N/A;  . DEEP NECK LYMPH NODE BIOPSY / EXCISION  2011   Family History:  Family History  Problem Relation Age of Onset  . Diabetes Mother   . Hypertension Mother   . Hypertension Father   . Stroke Maternal Grandfather    Family Psychiatric  History: family history significant for polysubstance abuse in her great aunt, great uncle and also history of mood disorder Social History:  History  Alcohol Use No    Comment: "not for a while"     History  Drug Use  . Types: "Crack" cocaine, Cocaine    Comment: cocai    Social History   Social History  . Marital status: Single    Spouse name: N/A  . Number of children: N/A  . Years of education: N/A   Social History Main Topics  . Smoking status: Never Smoker  . Smokeless tobacco: Never Used  . Alcohol use No     Comment: "not for a while"  . Drug use: Yes    Types: "Crack" cocaine, Cocaine     Comment: cocai  . Sexual activity: Not Currently    Birth control/ protection: None   Other Topics Concern  .  None   Social History Narrative  . None   Additional Social History:    Allergies:  No Known Allergies  Labs:  Results for orders placed or performed during the hospital encounter of 07/04/17 (from the past 48 hour(s))  Creatinine, serum     Status: None   Collection Time: 07/11/17  7:03 AM  Result Value Ref Range   Creatinine, Ser 0.67 0.44 - 1.00 mg/dL   GFR calc non Af Amer >60 >60 mL/min   GFR calc Af Amer >60 >60 mL/min    Comment: (NOTE) The eGFR has been calculated using the CKD EPI equation. This calculation has not been validated in all clinical situations. eGFR's persistently <60 mL/min signify possible Chronic Kidney Disease.   CBC     Status: Abnormal   Collection Time: 07/11/17  7:03 AM  Result Value Ref Range   WBC 13.6 (H) 4.0 - 10.5 K/uL    Comment: WHITE COUNT CONFIRMED ON SMEAR   RBC 2.99 (L) 3.87 - 5.11 MIL/uL   Hemoglobin 7.4 (L) 12.0 - 15.0 g/dL    Comment: REPEATED TO VERIFY   HCT 24.5 (L) 36.0 - 46.0 %   MCV 81.9 78.0 - 100.0 fL   MCH 24.7 (L) 26.0 - 34.0 pg  MCHC 30.2 30.0 - 36.0 g/dL   RDW 16.2 (H) 11.5 - 15.5 %   Platelets 634 (H) 150 - 400 K/uL    Comment: REPEATED TO VERIFY  Troponin I     Status: None   Collection Time: 07/11/17  4:10 PM  Result Value Ref Range   Troponin I <0.03 <0.03 ng/mL  Hemoglobin and hematocrit, blood     Status: Abnormal   Collection Time: 07/11/17  4:10 PM  Result Value Ref Range   Hemoglobin 8.1 (L) 12.0 - 15.0 g/dL   HCT 27.6 (L) 36.0 - 46.0 %  CBC     Status: Abnormal   Collection Time: 07/12/17  4:01 AM  Result Value Ref Range   WBC 11.9 (H) 4.0 - 10.5 K/uL   RBC 2.79 (L) 3.87 - 5.11 MIL/uL   Hemoglobin 6.6 (LL) 12.0 - 15.0 g/dL    Comment: REPEATED TO VERIFY SPECIMEN CHECKED FOR CLOTS CRITICAL RESULT CALLED TO, READ BACK BY AND VERIFIED WITH: V. GYEKYE,RN 0520 07/12/2017 T. TYSOR    HCT 22.6 (L) 36.0 - 46.0 %   MCV 81.0 78.0 - 100.0 fL   MCH 23.7 (L) 26.0 - 34.0 pg   MCHC 29.2 (L) 30.0 - 36.0  g/dL   RDW 16.0 (H) 11.5 - 15.5 %   Platelets 536 (H) 150 - 400 K/uL  Basic metabolic panel     Status: Abnormal   Collection Time: 07/12/17  4:01 AM  Result Value Ref Range   Sodium 133 (L) 135 - 145 mmol/L   Potassium 3.9 3.5 - 5.1 mmol/L   Chloride 99 (L) 101 - 111 mmol/L   CO2 23 22 - 32 mmol/L   Glucose, Bld 83 65 - 99 mg/dL   BUN 8 6 - 20 mg/dL   Creatinine, Ser 0.64 0.44 - 1.00 mg/dL   Calcium 8.1 (L) 8.9 - 10.3 mg/dL   GFR calc non Af Amer >60 >60 mL/min   GFR calc Af Amer >60 >60 mL/min    Comment: (NOTE) The eGFR has been calculated using the CKD EPI equation. This calculation has not been validated in all clinical situations. eGFR's persistently <60 mL/min signify possible Chronic Kidney Disease.    Anion gap 11 5 - 15  Type and screen [place]     Status: None (Preliminary result)   Collection Time: 07/12/17  6:17 AM  Result Value Ref Range   ABO/RH(D) O POS    Antibody Screen NEG    Sample Expiration 07/15/2017    Unit Number [id number]    Blood Component Type RED CELLS,LR    Unit division 00    Status of Unit ALLOCATED    Transfusion Status OK TO TRANSFUSE    Crossmatch Result Compatible    Unit Number [id number]    Blood Component Type RED CELLS,LR    Unit division 00    Status of Unit ALLOCATED    Transfusion Status OK TO TRANSFUSE    Crossmatch Result Compatible   Prepare RBC     Status: None   Collection Time: 07/12/17  6:17 AM  Result Value Ref Range   Order Confirmation ORDER PROCESSED BY BLOOD BANK DUPLICATE REQUEST   Prepare RBC     Status: None   Collection Time: 07/12/17  9:22 AM  Result Value Ref Range   Order Confirmation ORDER PROCESSED BY BLOOD BANK     Current Facility-Administered Medications  Medication Dose Route Frequency Provider Last Rate Last Dose  . 0.9 %  sodium chloride infusion   Intravenous Once Wendee Beavers T, MD      . 0.9 %  sodium chloride infusion   Intravenous Once Milus Banister, MD       . calcium carbonate (TUMS - dosed in mg elemental calcium) chewable tablet 200 mg of elemental calcium  1 tablet Oral BID PRN Sherene Sires, DO   200 mg of elemental calcium at 07/08/17 1700  . diphenhydrAMINE (BENADRYL) capsule 25 mg  25 mg Oral Q6H PRN Caroline More, DO   25 mg at 07/10/17 1208  . DULoxetine (CYMBALTA) DR capsule 30 mg  30 mg Oral Daily Bland, Scott, DO   30 mg at 07/12/17 1222  . feeding supplement (BOOST / RESOURCE BREEZE) liquid 1 Container  1 Container Oral TID BM Sherene Sires, DO   1 Container at 07/09/17 1622  . HYDROmorphone (DILAUDID) injection 1 mg  1 mg Intravenous Q4H PRN Milus Banister, MD   1 mg at 07/12/17 1152  . lactated ringers infusion   Intravenous Continuous Vena Rua, PA-C 200 mL/hr at 07/12/17 4315    . lidocaine (XYLOCAINE) 2 % jelly 1 application  1 application Topical Once PRN Sherene Sires, DO      . liver oil-zinc oxide (DESITIN) 40 % ointment   Topical 5 X Daily Bland, Scott, DO      . magic mouthwash  5 mL Oral TID Sherene Sires, DO   5 mL at 07/12/17 1223  . methylPREDNISolone sodium succinate (SOLU-MEDROL) 40 mg/mL injection 30 mg  30 mg Intravenous Q12H Milus Banister, MD   30 mg at 07/12/17 4008  . multivitamin with minerals tablet 1 tablet  1 tablet Oral Daily Sherene Sires, DO   1 tablet at 07/12/17 1224  . ondansetron (ZOFRAN) tablet 4 mg  4 mg Oral Q6H PRN Wendee Beavers T, MD       Or  . ondansetron (ZOFRAN) injection 4 mg  4 mg Intravenous Q6H PRN Wendee Beavers T, MD   4 mg at 07/08/17 0348  . pantoprazole (PROTONIX) EC tablet 40 mg  40 mg Oral Daily Tonette Bihari, MD   40 mg at 07/12/17 1222    Musculoskeletal: Strength & Muscle Tone: decreased Gait & Station: unable to stand Patient leans: N/A  Psychiatric Specialty Exam: Physical Exam s per history and physical  ROS generalized anxiety, depression, weakness disturbed sleep and appetite and ongoing abdominal pain. Patient stated she has no pain in her bottom and also  ruled out herpes. No Fever-chills, No Headache, No changes with Vision or hearing, reports vertigo No problems swallowing food or Liquids, No Chest pain, Cough or Shortness of Breath, No Abdominal pain, No Nausea or Vommitting, Bowel movements are regular, No Blood in stool or Urine, No dysuria, No new skin rashes or bruises, No new joints pains-aches,  No new weakness, tingling, numbness in any extremity, No recent weight gain or loss, No polyuria, polydypsia or polyphagia,  A full 10 point Review of Systems was done, except as stated above, all other Review of Systems were negative.  Blood pressure 140/81, pulse 88, temperature 98 F (36.7 C), temperature source Oral, resp. rate 16, height 5' 9"  (1.753 m), weight 84.2 kg (185 lb 10 oz), SpO2 100 %, unknown if currently breastfeeding.Body mass index is 27.41 kg/m.  General Appearance: Guarded  Eye Contact:  Good  Speech:  Clear and Coherent and Slow  Volume:  Decreased  Mood:  Depressed  Affect:  Constricted and  Depressed  Thought Process:  Coherent and Goal Directed  Orientation:  Full (Time, Place, and Person)  Thought Content:  WDL  Suicidal Thoughts:  No  Homicidal Thoughts:  No  Memory:  Immediate;   Good Recent;   Fair Remote;   Fair  Judgement:  Impaired  Insight:  Good  Psychomotor Activity:  Psychomotor Retardation  Concentration:  Concentration: Good and Attention Span: Fair  Recall:  Good  Fund of Knowledge:  Fair  Language:  Good  Akathisia:  Negative  Handed:  Right  AIMS (if indicated):     Assets:  Communication Skills Desire for Improvement Financial Resources/Insurance Housing Leisure Time Resilience Social Support Transportation  ADL's:  Impaired  Cognition:  WNL  Sleep:        Treatment Plan Summary: 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 Patient is [redacted] weeks  gestation period.  Postpartum depression Polysubstance abuse ( cocaine and opiates) Cocaine intoxication recent relapse  Recommendation: Patient has no safety concerns Will increase Cymbalta to 40 mg daily for depression, which can be titrated up to 60 mg daily in a week time for maximum benefits of the medication. Patient benefit from outpatient substance abuse and also medication management for postpartum depression from local psychiatric clinic Refer to the unit CSW for providing mental health outpatient services in local area Appreciate psychiatric consultation and we sign off as of today Please contact [phone] or [phone] if needs further assistance   Disposition: No evidence of imminent risk to self or others at present.   Patient does not meet criteria for psychiatric inpatient admission. Supportive therapy provided about ongoing stressors.  Ambrose Finland, MD 07/12/2017 1:10 PM

## 2017-07-12 NOTE — Progress Notes (Signed)
Went into patient's room and found a Baby crying ( 7 weeks  Per patient)   and 26 year old   (Per patient ) laying in bed with patient. Informed patient and mum that we are in the flu season and it will be appropriate to keep especially the 21 weeks old baby at home since she can easily catch infection from the hospital. Patient mum got angry  by pointing fingers at me and said " Mind your own business and just take care of my daughter". 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

## 2017-07-12 NOTE — Progress Notes (Signed)
Went into patient's room and found out IV pump was turned off. Inquired from patient and she informed me she turned off the pump herself. IV pump restarted

## 2017-07-12 NOTE — Progress Notes (Signed)
Pt refused , [place] notified

## 2017-07-13 LAB — BPAM RBC
Blood Product Expiration Date: 201811152359
Blood Product Expiration Date: 201811152359
ISSUE DATE / TIME: 201810181323
ISSUE DATE / TIME: 201810181630
Unit Type and Rh: 5100
Unit Type and Rh: 5100

## 2017-07-13 LAB — TYPE AND SCREEN
ABO/RH(D): O POS
Antibody Screen: NEGATIVE
Unit division: 0
Unit division: 0

## 2017-07-13 LAB — BASIC METABOLIC PANEL
Anion gap: 12 (ref 5–15)
BUN: <5 mg/dL — ABNORMAL LOW (ref 6–20)
CO2: 26 mmol/L (ref 22–32)
Calcium: 8.4 mg/dL — ABNORMAL LOW (ref 8.9–10.3)
Chloride: 96 mmol/L — ABNORMAL LOW (ref 101–111)
Creatinine, Ser: 0.63 mg/dL (ref 0.44–1.00)
GFR calc Af Amer: >60 mL/min (ref >60)
GFR calc non Af Amer: >60 mL/min (ref >60)
Glucose, Bld: 72 mg/dL (ref 65–99)
Potassium: 4.2 mmol/L (ref 3.5–5.1)
Sodium: 134 mmol/L — ABNORMAL LOW (ref 135–145)

## 2017-07-13 LAB — HEPATITIS C ANTIBODY: HCV Ab: <0.1 s/co ratio (ref 0.0–0.9)

## 2017-07-13 LAB — CBC
HCT: 29.3 % — ABNORMAL LOW (ref 36.0–46.0)
Hemoglobin: 9.2 g/dL — ABNORMAL LOW (ref 12.0–15.0)
MCH: 25.1 pg — ABNORMAL LOW (ref 26.0–34.0)
MCHC: 31.4 g/dL (ref 30.0–36.0)
MCV: 80.1 fL (ref 78.0–100.0)
Platelets: 443 10*3/uL — ABNORMAL HIGH (ref 150–400)
RBC: 3.66 MIL/uL — ABNORMAL LOW (ref 3.87–5.11)
RDW: 15.3 % (ref 11.5–15.5)
WBC: 13.7 10*3/uL — ABNORMAL HIGH (ref 4.0–10.5)

## 2017-07-13 LAB — HEPATITIS B SURFACE ANTIBODY,QUALITATIVE: Hep B S Ab: REACTIVE

## 2017-07-13 MED ORDER — OXYCODONE HCL 5 MG PO TABS
10.0000 mg | ORAL_TABLET | ORAL | Status: DC | PRN
  Administered 2017-07-13: 10 mg via ORAL
  Filled 2017-07-13: qty 2

## 2017-07-13 MED ORDER — OXYCODONE HCL 5 MG PO TABS
5.0000 mg | ORAL_TABLET | ORAL | Status: DC | PRN
  Administered 2017-07-13: 5 mg via ORAL
  Filled 2017-07-13: qty 1

## 2017-07-13 MED ORDER — ACETAMINOPHEN 325 MG PO TABS
650.0000 mg | ORAL_TABLET | Freq: Four times a day (QID) | ORAL | Status: DC | PRN
  Administered 2017-07-13: 650 mg via ORAL
  Filled 2017-07-13: qty 2

## 2017-07-13 MED ORDER — ENSURE ENLIVE PO LIQD
237.0000 mL | Freq: Three times a day (TID) | ORAL | Status: DC
  Administered 2017-07-13: 237 mL via ORAL

## 2017-07-13 MED ORDER — PREDNISONE 20 MG PO TABS: 40.0000 mg | ORAL_TABLET | Freq: Every day | ORAL | Status: DC

## 2017-07-13 MED ORDER — ENSURE ENLIVE PO LIQD: 237.0000 mL | Freq: Two times a day (BID) | ORAL | Status: DC

## 2017-07-13 MED ORDER — METHYLPREDNISOLONE SODIUM SUCC 40 MG IJ SOLR: 30.0000 mg | Freq: Two times a day (BID) | INTRAMUSCULAR | Status: DC

## 2017-07-13 NOTE — Progress Notes (Signed)
Paged the family medicine regarding pt c/o of pain, MD stated that they will be visiting the patient shortly.

## 2017-07-13 NOTE — Progress Notes (Signed)
Pt just called this nurse stating, " this pain medicine is not working. I am grimacing in pain and clinching my teeth." This nurse stated she would tell the MD on call.   Eleanora Neighbor, RN

## 2017-07-13 NOTE — Progress Notes (Signed)
Nutrition Follow-up  DOCUMENTATION CODES:   Not applicable  INTERVENTION:    -Ensure Enlive po TID, each supplement provides 350 kcal and 20 grams of protein  -D/C  Boost Breeze  -Preferences for FL diet obtained from pt to promote po intake   NUTRITION DIAGNOSIS:   Inadequate oral intake related to nausea, vomiting as evidenced by meal completion < 25%.  Continues but being addressed via change in supplement, preferences  GOAL:   Patient will meet greater than or equal to 90% of their needs  Progressing  MONITOR:   PO intake, Supplement acceptance, Diet advancement, Labs, Weight trends, Skin, I & O's  REASON FOR ASSESSMENT:   Consult Assessment of nutrition requirement/status (Low Prealbumin)  ASSESSMENT:   26 y.o. female presenting with abdominal pain . PMH is significant for HSV infection, substance abuse, depression, anxiety and anemia. Pt with with one month of abdominal pain, nausea/vomiting, and diarrhea. CT scan with findings of pancolitis.  Psych following for Post-Partum Depression. Pt alert, flat affect on visit today.   Pt with severe indeterminant colitis (possible Crohn's), acute pancreatitis  Pt still c/o of abdominal pain, present constantly, not associated with po intake. No N/V. Diarrhea improving but still present.   Pt tolerating FL diet; ate Grits this AM. However appetite is poor and po intake has been limited. Pt reports the only food items she has really eaten since admission are grits, broth, tomato soup. Pt does like like Jell-o or popsicles, has not tried the pudding but likes,  Prefers Lactaid Milk. PO intake is not nutritionally adequate at this time.  Pt not drinking Boost Breeze; does not like. Pt reports she likes Ensure and agrees to drink through out the day  Nutrition-Focused physical exam completed. Findings are WDL for fat depletion, muscle depletion, and edema.   Labs: sodium 134 Meds: LR at 150 ml/hr  Diet Order:  Diet  full liquid Room service appropriate? Yes; Fluid consistency: Thin  Skin:  Reviewed, no issues  Last BM:  10/17  Height:   Ht Readings from Last 1 Encounters:  07/10/17 5' 9"  (1.753 m)    Weight:   Wt Readings from Last 1 Encounters:  07/12/17 182 lb 15.7 oz (83 kg)    Ideal Body Weight:  65.9 kg  BMI:  Body mass index is 27.02 kg/m.  Estimated Nutritional Needs:   Kcal:  2000-2200  Protein:  85-100 grams  Fluid:  2- 2.2 L/day  EDUCATION NEEDS:   No education needs identified at this time  Beaulieu, [place], LDN [phone] Pager  [phone] Weekend/On-Call Pager

## 2017-07-13 NOTE — Progress Notes (Signed)
Tech offered Pt assistance with a bath. Pt stated that she has already taken care of her bath.

## 2017-07-13 NOTE — Progress Notes (Signed)
Pt wanted to leave AMA. Pt was given paperwork to sign. Pt told AC she wanted to leave AMA. Pt walked down to the ED where her mother was to pick her up.   Eleanora Neighbor, RN

## 2017-07-13 NOTE — Progress Notes (Signed)
Family Medicine Teaching Service Daily Progress Note Intern Pager: [phone]  Patient name: Latoya [name] Medical record number: [id number] Date of birth: Jul 25, 1991 Age: 26 y.o. Gender: female  Primary Care Provider: Fanny Bien, MD Consultants: GI Code Status: full  Pt Overview and Major Events to Date:  Latoya [name] is a 26 y.o. female presenting with abdominal pain . PMH is significant for HSV infection, substance abuse, depression, anxiety and anemia.   Anemia requiring a total of 2u transfusion.  Assessment and Plan: Latoya [name] is a 26 y.o. female presenting with abdominal pain . PMH is significant for HSV infection, substance abuse, depression, anxiety and anemia.   Indeterminate inflammatory bowel: significant increase in expressed pain overnight. Path notes collitis w/ ulceration but no granulomas -Follow-up with GI recommendations -no toradol per GI due to irritation -72m dilaudid q4prn per GI (would like to decrease today) -Zofran for nausea -solumedrol for 24-48hrs, dc'd prednisone -mesalamine held during acute phase -hep serology pending -TB quanaferon pending  Pancreatitis: confirmed via CT, likely gallstone related -clears -pain control currently on dilaudid, will begin to titrate -LR 2021mhr per GI -GI recs  Iron deficiency Anemia: hemoglobin 9.0 s/p Transfusing AM 10/18, also received 1 unit of blood 10/12. Has been fluctuating -will track  Depression/Anxiety: Concerning for postpartum depression. Psych recommends outpatient f/u, and increasing cymbalta to 4023mnd then 60 on 10/25.  She denies SI/HI. -cymbalta 40  Subclinical hyperthyroidism: TSH low, 0.121, but free T4 is WNL on 07/04/2017 -Outpatient follow-up  Acute protein calorie malnutrition (moderate)- 1.7 down from 3.7 end of Sept '18. Pre-albumin 11 (suggestive for malnutrition).   Patient consistently was refusing boost supplements -nutrition consult and ordered  supplementation  Substance use: UDS neg for anything but opioids. History of cocaine use  FEN/GI:  -npo  Prophylaxis: SCD pending GI bleed and GI input.  Disposition: pending GI clearance  Subjective:  Patient feels good about having spoken with psych, she understands medication plan.   Still had 1x diarrhea last night w/o looking for blood  Objective: Temp:  [97.6 F (36.4 C)-98.7 F (37.1 C)] 97.6 F (36.4 C) (10/19 0523) Pulse Rate:  [56-93] 60 (10/19 0523) Resp:  [14-18] 16 (10/19 0523) BP: (125-140)/(70-82) 137/82 (10/19 0523) SpO2:  [98 %-100 %] 100 % (10/19 0523) Weight:  [182 lb 15.7 oz (83 kg)] 182 lb 15.7 oz (83 kg) (10/18 2117) Physical Exam: General: uncomfortable.  Non-toxic appearing Cardiovascular: RRR, no murmurs noted Respiratory: CTAB, no wheezing noted, no IWB Gastrointestinal: Soft,no distension. Slightly Tender to palpation diffusely. No rebound tenderness, no masses noted, pain is not localized to RUQ or RLQ. MSK: DP pulses strong. No LE edema Derm: Warm and dry. Neuro: Fluent speech. Alert, no gross neuro deficits noted Psych: Responds to questions appropriately but flat affect although she did actually smile a few times.  Laboratory:  Recent Labs Lab 07/11/17 0703 07/11/17 1610 07/12/17 0401 07/12/17 2135  WBC 13.6*  --  11.9* 14.3*  HGB 7.4* 8.1* 6.6* 9.0*  HCT 24.5* 27.6* 22.6* 28.4*  PLT 634*  --  536* 442*    Recent Labs Lab 07/06/17 0657  07/09/17 1035 07/10/17 0455 07/11/17 0703 07/12/17 0401  NA 142  < > 137 136  --  133*  K 3.5  < > 3.3* 4.4  --  3.9  CL 108  < > 103 102  --  99*  CO2 27  < > 24 28  --  23  BUN 6  < >  8 9  --  8  CREATININE 0.78  < > 0.72 0.59 0.67 0.64  CALCIUM 8.2*  < > 8.5* 8.3*  --  8.1*  PROT 6.0*  --  6.2*  --   --   --   BILITOT 0.4  --  0.6  --   --   --   ALKPHOS 116  --  120  --   --   --   ALT 29  --  32  --   --   --   AST 24  --  18  --   --   --   GLUCOSE 93  < > 133* 87  --  83  < >  = values in this interval not displayed.    Imaging/Diagnostic Tests: Dg Chest 2 View  Result Date: 07/04/2017 CLINICAL DATA:  Fever and chest and abdominal pain EXAM: CHEST  2 VIEW COMPARISON:  Chest x-ray dated October 10, 2016 FINDINGS: The lungs are well-expanded and clear. The heart and pulmonary vascularity are normal. The mediastinum is normal in width. There is no pleural effusion. The gas pattern in the upper abdomen is normal. The bony thorax is unremarkable. IMPRESSION: There is no active cardiopulmonary disease. Electronically Signed   By: David  Martinique M.D.   On: 07/04/2017 08:57   Ct Abdomen Pelvis W Contrast  Result Date: 07/04/2017 CLINICAL DATA:  Lower abdominal pain and diarrhea for 1 month. EXAM: CT ABDOMEN AND PELVIS WITH CONTRAST TECHNIQUE: Multidetector CT imaging of the abdomen and pelvis was performed using the standard protocol following bolus administration of intravenous contrast. CONTRAST:  141m ISOVUE-300 IOPAMIDOL (ISOVUE-300) INJECTION 61% COMPARISON:  05/19/2011 CT abdomen/pelvis. FINDINGS: Lower chest: No significant pulmonary nodules or acute consolidative airspace disease. Hepatobiliary: Normal liver with no liver mass. Cholelithiasis, with no gallbladder wall thickening or pericholecystic fluid. No biliary ductal dilatation. Pancreas: Normal, with no mass or duct dilation. Spleen: Normal size. No mass. Adrenals/Urinary Tract: Normal adrenals. Normal kidneys with no hydronephrosis and no renal mass. Normal bladder. Stomach/Bowel: Grossly normal stomach. Normal caliber small bowel with no small bowel wall thickening. Appendix is within normal limits. There is diffuse mild-to-moderate colorectal wall thickening with associated pericolonic fat stranding without skip lesions. Vascular/Lymphatic: Normal caliber abdominal aorta. Patent portal, splenic, hepatic and renal veins. Mild right mesenteric adenopathy measuring up to 1.1 cm (series 3/ image 54), new. Reproductive:  Grossly normal uterus.  No adnexal mass. Other: No pneumoperitoneum, ascites or focal fluid collection. Musculoskeletal: No aggressive appearing focal osseous lesions. IMPRESSION: 1. Nonspecific infectious or inflammatory pancolitis, with the differential including inflammatory bowel disease. No obstruction, perforation, fistula or abscess. 2. Nonspecific mild right mesenteric adenopathy, probably reactive. 3. Cholelithiasis. No CT findings of acute cholecystitis. No biliary ductal dilatation. Electronically Signed   By: JIlona SorrelM.D.   On: 07/04/2017 10:16   BSherene Sires DO 07/13/2017, 5:55 AM PGY-3, [place] pager: [phone] text pages welcome

## 2017-07-13 NOTE — Progress Notes (Signed)
FPTS Interim Progress Note  S:Paged by nurse about patient's pain increasing and her dissatisfaction with percocet instead of dilaudid  Went to room and patient's family was present.  We discussed treatment plan and their dissatisfaction with progession from IV to oral medications.   They expressed that they had began to evaluate other facilities to provide the plans that they found appropriate.   I told them I would examine the patient and then discuss the next course of action with the medical team.  O: BP (!) 159/92   Pulse 62   Temp 97.7 F (36.5 C) (Oral)   Resp 18   Ht 5' 9"  (1.753 m)   Wt 182 lb 15.7 oz (83 kg)   LMP  (LMP Unknown)   SpO2 100%   BMI 27.02 kg/m     A/P: Increase in expressed pain to 6/10, claimed it would be 9/10 within an hour.  Did have questionable murphy sign and increased RUQ tenderness although there was slight diffuse tenderness.  Will increase percocet to 10 instead of 5 Will HIDA scan given confirmed pancreatitis with likely gallstones and new murphy sign with increased pain Attempted to call patient's room to discuss updates to plan  Sherene Sires, DO 07/13/2017, 8:39 PM PGY-1, [place] [facility] pager [phone]

## 2017-07-13 NOTE — Progress Notes (Signed)
PT and mother just called this RN again about pt not getting IV Dilaudid. Pt has already been seen by the MD and the [facility] was called to come and see pt per pt request. Mother and pt are not happy with the care at [facility] and was stating that she needs to be moved elsewhere. This nurse told mother that she could sign pt out and take her somewhere else if that was her wishes. Mother wants a new MD for the pt and this nurse told her that would have to be done in the am and that this nurse made the MD aware. No new orders were given to the pt by MD and it is not time to give any more pain medication. Pt just had pain medication increased and given at 2000.  Eleanora Neighbor, RN

## 2017-07-13 NOTE — Progress Notes (Signed)
Daily Rounding Note  07/13/2017, 8:25 AM  LOS: 9 days   SUBJECTIVE:   Slept ok.  Ongoing "stomach" pain, diffuse abd pain.  Nausea, not often.  On clears. Stools loose/soft, brown with small blood at times.       OBJECTIVE:         Vital signs in last 24 hours:    Temp:  [97.6 F (36.4 C)-98.7 F (37.1 C)] 97.6 F (36.4 C) (10/19 0523) Pulse Rate:  [56-93] 60 (10/19 0523) Resp:  [14-18] 16 (10/19 0523) BP: (125-140)/(70-82) 137/82 (10/19 0523) SpO2:  [98 %-100 %] 100 % (10/19 0523) Weight:  [83 kg (182 lb 15.7 oz)] 83 kg (182 lb 15.7 oz) (10/18 2117) Last BM Date: 07/11/17 Filed Weights   07/11/17 0144 07/12/17 2117  Weight: 84.2 kg (185 lb 10 oz) 83 kg (182 lb 15.7 oz)   General: continues depressed,  Non-ill appearing   Heart: RRR Chest: clear bil.   Abdomen: soft, ND.  NT.  BS hypoactive  Extremities: no CCE Neuro/Psych:  Oriented x 3.  Alert.  Depressed.    Lab Results:  Recent Labs  07/12/17 0401 07/12/17 2135 07/13/17 0653  WBC 11.9* 14.3* 13.7*  HGB 6.6* 9.0* 9.2*  HCT 22.6* 28.4* 29.3*  PLT 536* 442* 443*   BMET  Recent Labs  07/11/17 0703 07/12/17 0401  NA  --  133*  K  --  3.9  CL  --  99*  CO2  --  23  GLUCOSE  --  83  BUN  --  8  CREATININE 0.67 0.64  CALCIUM  --  8.1*   LFT No results for input(s): PROT, ALBUMIN, AST, ALT, ALKPHOS, BILITOT, BILIDIR, IBILI in the last 72 hours. PT/INR No results for input(s): LABPROT, INR in the last 72 hours. Hepatitis Panel  Recent Labs  07/12/17 0401  HCVAB <0.1    Studies/Results: No results found.  Scheduled Meds: . DULoxetine  40 mg Oral Daily  . feeding supplement  1 Container Oral TID BM  . liver oil-zinc oxide   Topical 5 X Daily  . magic mouthwash  5 mL Oral TID  . methylPREDNISolone (SOLU-MEDROL) injection  30 mg Intravenous Q12H  . multivitamin with minerals  1 tablet Oral Daily  . pantoprazole  40 mg Oral Daily    Continuous Infusions: . sodium chloride    . sodium chloride    . lactated ringers 200 mL/hr at 07/13/17 0507   PRN Meds:.calcium carbonate, diphenhydrAMINE, HYDROmorphone (DILAUDID) injection, lidocaine, ondansetron **OR** ondansetron (ZOFRAN) IV  ASSESMENT:   *  Indeterminate colitis.  Suspect Crohn's, Day 3 IV steroids.  Mesalamine appeared to have worsened sxs after only 1 dose, so discontinued  Clinically about the same  *  Acute pancreatitis, presumed biliary or 'reactive' due to severe colitis.  Gallstones, no ductal dilatation or CBD stones on CT  10/15.  LR running at 200/hour.     *  Anemia.  S/p PRBC x 3, 2 on 10/18.    *  Depression. Psych MD increased Cymbalta yesterday.      PLAN   *  No change in plans. IV Solumedrol, clears.  Will drop LR to 150/hour    Azucena Freed  07/13/2017, 8:25 AM Pager: [phone]  ________________________________________________________________________  Velora Heckler GI MD note:  I personally examined the patient, reviewed the data and agree with the assessment and plan described above.  She should stay on IV steroids until  clearly improving clinically.     Owens Loffler, MD [facility] Pager [phone]

## 2017-07-13 NOTE — Progress Notes (Signed)
Upon reviewing patient census, noticed patient was no longer listed. Discussed with nurse who then informed FPTS that patient left AMA. Physician team was not notified of event and was therefore unable to go discuss risks of leaving [place] and confirm patient's understanding of AMA risks.   Dalphine Handing, PGY-1 [place] [facility] 07/13/2017 11:10 PM

## 2017-07-13 NOTE — Progress Notes (Signed)
This nurse called [facility] and she stated she would come and speak with the pt.   Eleanora Neighbor, RN

## 2017-07-13 NOTE — Progress Notes (Signed)
MD just went and spoke with pt regarding pt wanting IV pain medication back. Pt is very unhappy and had mother call this nurse. Pt and mother want a new MD. This nurse let MD know and told pt and mother that the MD would have to be changed in the am when rounds are made. Pt then called crying and wanting the [facility] called. This nurse will call [facility] to see if she can come speak with her.   Eleanora Neighbor, RN

## 2017-07-14 ENCOUNTER — Telehealth: Payer: Self-pay | Admitting: Family Medicine

## 2017-07-14 NOTE — Telephone Encounter (Signed)
Family Medicine Progress Note  Attempted to reach patient in order to try and offer prescription for psych medications, despite her leaving AMA overnight. When called the phone rang twice and went straight to voicemail. Patient did not have a voicemail set up. Will try to reach patient again if time allows but believe due diligence has been done given that she left AMA.  Guadalupe Dawn MD PGY-1 Family Medicine Resident

## 2017-07-19 LAB — QUANTIFERON-TB GOLD PLUS (RQFGPL)
QuantiFERON Mitogen Value: 0.06 IU/mL
QuantiFERON Nil Value: 0.03 IU/mL
QuantiFERON TB1 Ag Value: 0.03 IU/mL
QuantiFERON TB2 Ag Value: 0.02 IU/mL

## 2017-07-19 LAB — QUANTIFERON-TB GOLD PLUS: QuantiFERON-TB Gold Plus: UNDETERMINED

## 2017-07-23 ENCOUNTER — Other Ambulatory Visit: Payer: Self-pay | Admitting: Family Medicine

## 2017-07-23 DIAGNOSIS — Z9049 Acquired absence of other specified parts of digestive tract: Secondary | ICD-10-CM

## 2017-07-25 ENCOUNTER — Ambulatory Visit
Admission: RE | Admit: 2017-07-25 | Discharge: 2017-07-25 | Disposition: A | Discharge status: Home / Self Care | Payer: Medicaid Other | Source: Ambulatory Visit | Attending: Family Medicine | Admitting: Family Medicine

## 2017-07-25 DIAGNOSIS — Z9049 Acquired absence of other specified parts of digestive tract: Secondary | ICD-10-CM

## 2017-07-30 ENCOUNTER — Telehealth: Payer: Self-pay | Admitting: Gastroenterology

## 2017-07-30 ENCOUNTER — Telehealth: Payer: Self-pay | Admitting: General Practice

## 2017-07-30 NOTE — Telephone Encounter (Signed)
Patient called into front office requesting HSV results. Informed patient of negative swab results but + blood test which could just be from cold sores. Patient verbalized understanding to all and asked if she needed to bring an IUD with her tomorrow or if we have IUDs in the office. Told patient we have them in the office. Patient verbalized understanding & had no questions

## 2017-07-30 NOTE — Telephone Encounter (Signed)
Called back and voicemail answered. Offered Wednesday at 3 pm. If she wants this appointment she should call back asap. Otherwise she will keep the Friday appointment as scheduled.

## 2017-07-31 ENCOUNTER — Ambulatory Visit: Payer: Self-pay | Admitting: Obstetrics and Gynecology

## 2017-07-31 ENCOUNTER — Emergency Department (HOSPITAL_COMMUNITY)
Admission: EM | Admit: 2017-07-31 | Discharge: 2017-08-01 | Disposition: A | Discharge status: Home / Self Care | Payer: BLUE CROSS/BLUE SHIELD | Attending: Emergency Medicine | Admitting: Emergency Medicine

## 2017-07-31 ENCOUNTER — Encounter (HOSPITAL_COMMUNITY): Payer: Self-pay

## 2017-07-31 DIAGNOSIS — R1084 Generalized abdominal pain: Secondary | ICD-10-CM | POA: Diagnosis present

## 2017-07-31 DIAGNOSIS — Z79899 Other long term (current) drug therapy: Secondary | ICD-10-CM | POA: Diagnosis not present

## 2017-07-31 LAB — COMPREHENSIVE METABOLIC PANEL
ALT: 46 U/L (ref 14–54)
AST: 17 U/L (ref 15–41)
Albumin: 3.8 g/dL (ref 3.5–5.0)
Alkaline Phosphatase: 134 U/L — ABNORMAL HIGH (ref 38–126)
Anion gap: 12 (ref 5–15)
BUN: 12 mg/dL (ref 6–20)
CO2: 24 mmol/L (ref 22–32)
Calcium: 9.6 mg/dL (ref 8.9–10.3)
Chloride: 103 mmol/L (ref 101–111)
Creatinine, Ser: 0.72 mg/dL (ref 0.44–1.00)
GFR calc Af Amer: >60 mL/min (ref >60)
GFR calc non Af Amer: >60 mL/min (ref >60)
Glucose, Bld: 125 mg/dL — ABNORMAL HIGH (ref 65–99)
Potassium: 3.4 mmol/L — ABNORMAL LOW (ref 3.5–5.1)
Sodium: 139 mmol/L (ref 135–145)
Total Bilirubin: 0.8 mg/dL (ref 0.3–1.2)
Total Protein: 8.2 g/dL — ABNORMAL HIGH (ref 6.5–8.1)

## 2017-07-31 LAB — CBC
HCT: 32.9 % — ABNORMAL LOW (ref 36.0–46.0)
Hemoglobin: 10.4 g/dL — ABNORMAL LOW (ref 12.0–15.0)
MCH: 27.7 pg (ref 26.0–34.0)
MCHC: 31.6 g/dL (ref 30.0–36.0)
MCV: 87.5 fL (ref 78.0–100.0)
Platelets: 627 10*3/uL — ABNORMAL HIGH (ref 150–400)
RBC: 3.76 MIL/uL — ABNORMAL LOW (ref 3.87–5.11)
RDW: 18.5 % — ABNORMAL HIGH (ref 11.5–15.5)
WBC: 28 10*3/uL — ABNORMAL HIGH (ref 4.0–10.5)

## 2017-07-31 LAB — URINALYSIS, ROUTINE W REFLEX MICROSCOPIC
Bacteria, UA: NONE SEEN
Bilirubin Urine: NEGATIVE
Glucose, UA: NEGATIVE mg/dL
Hgb urine dipstick: NEGATIVE
Ketones, ur: 5 mg/dL — AB
Leukocytes, UA: NEGATIVE
Nitrite: NEGATIVE
Protein, ur: 100 mg/dL — AB
Specific Gravity, Urine: >1.046 — ABNORMAL HIGH (ref 1.005–1.030)
pH: 5 (ref 5.0–8.0)

## 2017-07-31 LAB — LIPASE, BLOOD: Lipase: 44 U/L (ref 11–51)

## 2017-07-31 LAB — POC URINE PREG, ED: Preg Test, Ur: NEGATIVE

## 2017-07-31 MED ORDER — SODIUM CHLORIDE 0.9 % IV BOLUS (SEPSIS)
1000.0000 mL | Freq: Once | INTRAVENOUS | Status: AC
  Administered 2017-08-01: 1000 mL via INTRAVENOUS

## 2017-07-31 NOTE — ED Triage Notes (Signed)
Pt states she was recently dx with crohn's dx after having her gallbladder removed two weeks ago, pt also just had a baby last month She has her first GI appt Friday but isn't able to keep anything down

## 2017-08-01 ENCOUNTER — Emergency Department (HOSPITAL_COMMUNITY): Payer: BLUE CROSS/BLUE SHIELD

## 2017-08-01 MED ORDER — ONDANSETRON HCL 4 MG/2ML IJ SOLN
4.0000 mg | Freq: Once | INTRAMUSCULAR | Status: AC
  Administered 2017-08-01: 4 mg via INTRAVENOUS
  Filled 2017-08-01: qty 2

## 2017-08-01 MED ORDER — MORPHINE SULFATE (PF) 4 MG/ML IV SOLN
4.0000 mg | Freq: Once | INTRAVENOUS | Status: AC
  Administered 2017-08-01: 4 mg via INTRAVENOUS
  Filled 2017-08-01: qty 1

## 2017-08-01 MED ORDER — HYDROCODONE-ACETAMINOPHEN 5-325 MG PO TABS: 1.0000 | ORAL_TABLET | Freq: Four times a day (QID) | ORAL | 0 refills | Status: DC | PRN

## 2017-08-01 NOTE — Discharge Instructions (Signed)
Please keep your appointment with your GI doctor.   Today you received medications that may make you sleepy or impair your ability to make decisions.  For the next 24 hours please do not drive, operate heavy machinery, care for a small child with out another adult present, or perform any activities that may cause harm to you or someone else if you were to fall asleep or be impaired.   You are being prescribed a medication which may make you sleepy. Please follow up of listed precautions for at least 24 hours after taking one dose.

## 2017-08-01 NOTE — ED Provider Notes (Signed)
[facility] DEPT Provider Note   CSN: [id number] Arrival date & time: 07/31/17  1954     History   Chief Complaint Chief Complaint  Patient presents with  . Emesis    HPI Latoya [name] is a 26 y.o. female with a history of Crohn's disease, drug-seeking behavior, substance abuse, who presents today for evaluation of abdominal pain.  She was recently admitted at [facility] and then at [facility] for similar.  She had her gallbladder removed on 10/25.  She has since been having pain.  She reports that today she has been unable to eat or drink anything more than ginger ale.  She denies diarrhea.  She feels like she is having a Crohn's disease flare.  This is a new diagnosis for her.  Her pain hurts "all over" her abdomen, but is much worse on left side than right.  She does not have much RUQ pain.  She denies possibility of pregnancy, stating she has not had sex since she delivered her baby.     HPI  Past Medical History:  Diagnosis Date  . Anemia   . Anxiety   . Depression   . Drug-seeking behavior   . Gestational diabetes   . History of substance abuse   . HSV infection   . Pregnancy induced hypertension     Patient Active Problem List   Diagnosis Date Noted  . Post partum depression 07/12/2017  . IBD (inflammatory bowel disease)   . Acute pancreatitis   . Iron deficiency anemia due to chronic blood loss   . Heme positive stool   . Pancolitis ([place]) 07/04/2017  . Generalized abdominal pain   . Diarrhea   . Major depressive disorder, recurrent severe without psychotic features ([place]) 06/29/2017  . Cocaine abuse ([place]) 06/29/2017  . SVD (spontaneous vaginal delivery) 05/15/2017  . Crack cocaine use 05/15/2017  . Cervical dysplasia 01/16/2017  . Obesity (BMI 30.0-34.9) 01/12/2017  . History of substance abuse 01/12/2017  . History of gestational diabetes mellitus (GDM) 01/12/2017  . History of gestational hypertension 01/12/2017  . History  of macrosomia in infant in prior pregnancy, currently pregnant 01/12/2017  . Short interval between pregnancies affecting pregnancy in second trimester, antepartum 01/12/2017  . Late prenatal care 01/12/2017  . Paxil use in early pregnancy 01/12/2017  . History of suicide attempt 01/12/2017  . Anxiety and depression 01/12/2017  . Supervision of high risk pregnancy, antepartum 01/10/2017  . GDM (gestational diabetes mellitus) 02/25/2016  . Obesity in pregnancy 02/25/2016    Past Surgical History:  Procedure Laterality Date  . DEEP NECK LYMPH NODE BIOPSY / EXCISION  2011    OB History    Gravida Para Term Preterm AB Living   2 2 2  0 0 2   SAB TAB Ectopic Multiple Live Births   0 0 0 0 2       Home Medications    Prior to Admission medications   Medication Sig Start Date End Date Taking? Authorizing Provider  acetaminophen (TYLENOL) 500 MG tablet Take 1,000 mg every 6 (six) hours as needed by mouth for mild pain.   Yes [provider]  nitroGLYCERIN (NITROGLYN) 2 % ointment Apply 0.5 inches topically 2 (two) times daily.   Yes [provider]  PARoxetine (PAXIL-CR) 25 MG 24 hr tablet Take 1 tablet (25 mg total) by mouth daily. 05/16/17  Yes Dove, Myra C, MD  predniSONE (DELTASONE) 10 MG tablet 6 tabs po daily x 7  days 5 tabs po daily x 7 days 4 tabs po daily x 7 days 3 tabs po daily x 7 days 2 tabs po daily x 7 days 1 tab po daily x 7 days half tab po daily x 7 days 07/21/17  Yes [provider]  HYDROcodone-acetaminophen (NORCO/VICODIN) 5-325 MG tablet Take 1 tablet every 6 (six) hours as needed by mouth. 08/01/17   Lorin Glass, PA-C    Family History Family History  Problem Relation Age of Onset  . Diabetes Mother   . Hypertension Mother   . Hypertension Father   . Stroke Maternal Grandfather     Social History Social History   Tobacco Use  . Smoking status: Never Smoker  . Smokeless tobacco: Never Used  Substance Use Topics    . Alcohol use: No    Comment: "not for a while"  . Drug use: Yes    Types: "Crack" cocaine, Cocaine    Comment: cocai     Allergies   Patient has no known allergies.   Review of Systems Review of Systems  Constitutional: Positive for appetite change. Negative for chills and fever.  HENT: Negative for ear pain and sore throat.   Eyes: Negative for pain and visual disturbance.  Respiratory: Negative for cough and shortness of breath.   Cardiovascular: Negative for chest pain and palpitations.  Gastrointestinal: Positive for abdominal pain and nausea. Negative for blood in stool, constipation, diarrhea and vomiting.  Genitourinary: Negative for dysuria and hematuria.  Musculoskeletal: Negative for arthralgias and back pain.  Skin: Negative for color change and rash.  Neurological: Negative for seizures, syncope and headaches.  All other systems reviewed and are negative.    Physical Exam Updated Vital Signs BP 125/86   Pulse 92   Temp 98.1 F (36.7 C) (Oral)   Resp 18   Ht 5' 9"  (1.753 m)   Wt 74.8 kg (165 lb)   SpO2 99%   BMI 24.37 kg/m   Physical Exam  Constitutional: She is oriented to person, place, and time. She appears well-developed and well-nourished. No distress.  HENT:  Head: Normocephalic and atraumatic.  Eyes: Conjunctivae are normal.  Neck: Neck supple.  Cardiovascular: Normal rate, regular rhythm and normal heart sounds.  No murmur heard. Pulmonary/Chest: Effort normal and breath sounds normal. No respiratory distress. She has no wheezes.  Abdominal: Soft. Bowel sounds are normal. She exhibits no distension and no mass. There is no hepatosplenomegaly. There is no tenderness. There is no rigidity, no rebound, no guarding, no tenderness at McBurney's point and negative Murphy's sign.  Unable to re-create patient's pain with palpation.  Does not appear to be tender.   Musculoskeletal: She exhibits no edema.  Neurological: She is alert and oriented to  person, place, and time.  Skin: Skin is warm and dry.  Psychiatric: She has a normal mood and affect. Her behavior is normal.  Nursing note and vitals reviewed.    ED Treatments / Results  Labs (all labs ordered are listed, but only abnormal results are displayed) Labs Reviewed  COMPREHENSIVE METABOLIC PANEL - Abnormal; Notable for the following components:      Result Value   Potassium 3.4 (*)    Glucose, Bld 125 (*)    Total Protein 8.2 (*)    Alkaline Phosphatase 134 (*)    All other components within normal limits  CBC - Abnormal; Notable for the following components:   WBC 28.0 (*)    RBC 3.76 (*)  Hemoglobin 10.4 (*)    HCT 32.9 (*)    RDW 18.5 (*)    Platelets 627 (*)    All other components within normal limits  URINALYSIS, ROUTINE W REFLEX MICROSCOPIC - Abnormal; Notable for the following components:   Color, Urine AMBER (*)    APPearance HAZY (*)    Specific Gravity, Urine >1.046 (*)    Ketones, ur 5 (*)    Protein, ur 100 (*)    Squamous Epithelial / LPF 6-30 (*)    All other components within normal limits  LIPASE, BLOOD  POC URINE PREG, ED    EKG  EKG Interpretation None       Radiology Dg Abd Acute W/chest  Result Date: 08/01/2017 CLINICAL DATA:  Left-sided abdominal pain today. Chest pain. History of Crohn disease. Recent gallbladder surgery. EXAM: DG ABDOMEN ACUTE W/ 1V CHEST COMPARISON:  Chest radiograph 07/04/2017. CT abdomen and pelvis 07/09/2017 FINDINGS: Heart size and pulmonary vascularity are normal. Developing patchy perihilar infiltrates with peribronchial thickening suggesting developing acute bronchitis. No focal consolidation or volume loss. No blunting of costophrenic angles. No pneumothorax. Scattered gas and stool in the colon. No small or large bowel distention. No free intra-abdominal air. No abnormal air-fluid levels. No radiopaque stones. Visualized bones appear intact. Surgical clips in the right upper quadrant. IMPRESSION: New  peribronchial thickening and perihilar opacities suggesting developing bronchitis. Normal nonobstructive bowel gas pattern. Electronically Signed   By: Lucienne Capers M.D.   On: 08/01/2017 00:32    Procedures Procedures (including critical care time)  Medications Ordered in ED Medications  sodium chloride 0.9 % bolus 1,000 mL (0 mLs Intravenous Stopped 08/01/17 0139)  ondansetron (ZOFRAN) injection 4 mg (4 mg Intravenous Given 08/01/17 0053)  morphine 4 MG/ML injection 4 mg (4 mg Intravenous Given 08/01/17 0053)     Initial Impression / Assessment and Plan / ED Course  I have reviewed the triage vital signs and the nursing notes.  Pertinent labs & imaging results that were available during my care of the patient were reviewed by me and considered in my medical decision making (see chart for details).    Latoya [name] presents for evaluation of abdominal pain.  She has a history of cholecystectomy on 07/19/17, along with recent diagnosis of Crohn's disease.  She presents today for generalized abdominal pain.  Physical exam was very reassuring, no localized tenderness to palpation.  Patient is currently taking steroids for Crohn's disease, and has been for multiple weeks.  Labs were obtained, lipase within normal limits, white count is significantly elevated, however can be attributed to steroid use.  Given her very reassuring abdominal exam with out peritoneal signs, lack of infectious symptoms, no fevers, chills, or other infectious symptoms I feel that her leukocytosis is secondary to steroid use.  KUB was obtained which was reassuring.  Patiently was initially tachycardic, however this resolved after fluids and pain control.  Her pain had been previously well controlled, however today she ran out of her narcotic pain medications.  I suspect that her tachycardia is due to a combination of mild dehydration along with pain.  Serial abdominal exams were performed, patient's condition did not  worsen, and she had a very benign abdominal exam.  CT scan was considered, however patient has had 2-3 abdominal CT scans in the past month.  Given the nonfocal nature of pain, benign abdominal exam, I do not feel like patient needs a CT scan today.  This was all discussed with the patient,  and she was given return precautions, and states her understanding.  He has an appointment with her gastroenterologist on Friday.  Given that she has follow-up and this appears to be breakthrough pain and she ran out of her pain medicines, will give her 5 Norco to help control her pain until she can see her gastroenterologist.  Based on the nature of her pain I do not suspect OB/GYN causes of her pain.  At the time of discharge she was not tachycardic, her pain improved after Zofran, morphine, and fluids.  She did not develop peritoneal signs.  She is not pregnant.  Labs appear mostly consistent with her baseline.   At this time there does not appear to be any evidence of an acute emergency medical condition and the patient appears stable for discharge with appropriate outpatient follow up.Diagnosis was discussed with patient who verbalizes understanding and is agreeable to discharge. Pt case discussed with and seen by Dr. Kathrynn Humble who agrees with my plan.    Final Clinical Impressions(s) / ED Diagnoses   Final diagnoses:  Generalized abdominal pain    ED Discharge Orders        Ordered    HYDROcodone-acetaminophen (NORCO/VICODIN) 5-325 MG tablet  Every 6 hours PRN     08/01/17 0150       Lorin Glass, PA-C 08/01/17 [place], Ankit, MD 08/02/17 [phone]

## 2017-08-03 ENCOUNTER — Other Ambulatory Visit (INDEPENDENT_AMBULATORY_CARE_PROVIDER_SITE_OTHER): Payer: BLUE CROSS/BLUE SHIELD

## 2017-08-03 ENCOUNTER — Ambulatory Visit (INDEPENDENT_AMBULATORY_CARE_PROVIDER_SITE_OTHER): Payer: BLUE CROSS/BLUE SHIELD | Admitting: Physician Assistant

## 2017-08-03 ENCOUNTER — Encounter: Payer: Self-pay | Admitting: Physician Assistant

## 2017-08-03 VITALS — BP 124/70 | HR 114 | Ht 69.0 in | Wt 168.0 lb

## 2017-08-03 DIAGNOSIS — R1013 Epigastric pain: Secondary | ICD-10-CM

## 2017-08-03 DIAGNOSIS — R1084 Generalized abdominal pain: Secondary | ICD-10-CM

## 2017-08-03 DIAGNOSIS — F1410 Cocaine abuse, uncomplicated: Secondary | ICD-10-CM

## 2017-08-03 DIAGNOSIS — K5100 Ulcerative (chronic) pancolitis without complications: Secondary | ICD-10-CM

## 2017-08-03 DIAGNOSIS — K529 Noninfective gastroenteritis and colitis, unspecified: Secondary | ICD-10-CM

## 2017-08-03 LAB — COMPREHENSIVE METABOLIC PANEL
ALT: 20 U/L (ref 0–35)
AST: 7 U/L (ref 0–37)
Albumin: 3.8 g/dL (ref 3.5–5.2)
Alkaline Phosphatase: 89 U/L (ref 39–117)
BUN: 12 mg/dL (ref 6–23)
CO2: 27 mEq/L (ref 19–32)
Calcium: 9.5 mg/dL (ref 8.4–10.5)
Chloride: 103 mEq/L (ref 96–112)
Creatinine, Ser: 0.51 mg/dL (ref 0.40–1.20)
GFR: 190 mL/min (ref >60.00)
Glucose, Bld: 111 mg/dL — ABNORMAL HIGH (ref 70–99)
Potassium: 3.7 mEq/L (ref 3.5–5.1)
Sodium: 139 mEq/L (ref 135–145)
Total Bilirubin: 0.2 mg/dL (ref 0.2–1.2)
Total Protein: 7.6 g/dL (ref 6.0–8.3)

## 2017-08-03 LAB — CBC WITH DIFFERENTIAL/PLATELET
Basophils Absolute: 0 10*3/uL (ref 0.0–0.1)
Basophils Relative: 0.1 % (ref 0.0–3.0)
Eosinophils Absolute: 0 10*3/uL (ref 0.0–0.7)
Eosinophils Relative: 0 % (ref 0.0–5.0)
HCT: 30.1 % — ABNORMAL LOW (ref 36.0–46.0)
Hemoglobin: 9.3 g/dL — ABNORMAL LOW (ref 12.0–15.0)
Lymphocytes Relative: 3.5 % — ABNORMAL LOW (ref 12.0–46.0)
Lymphs Abs: 0.6 10*3/uL — ABNORMAL LOW (ref 0.7–4.0)
MCHC: 30.9 g/dL (ref 30.0–36.0)
MCV: 87.2 fl (ref 78.0–100.0)
Monocytes Absolute: 0.2 10*3/uL (ref 0.1–1.0)
Monocytes Relative: 1 % — ABNORMAL LOW (ref 3.0–12.0)
Neutro Abs: 15.9 10*3/uL — ABNORMAL HIGH (ref 1.4–7.7)
Neutrophils Relative %: 95.4 % — ABNORMAL HIGH (ref 43.0–77.0)
Platelets: 558 10*3/uL — ABNORMAL HIGH (ref 150.0–400.0)
RBC: 3.46 Mil/uL — ABNORMAL LOW (ref 3.87–5.11)
RDW: 18.9 % — ABNORMAL HIGH (ref 11.5–15.5)
WBC: 16.7 10*3/uL — ABNORMAL HIGH (ref 4.0–10.5)

## 2017-08-03 MED ORDER — HYOSCYAMINE SULFATE 0.125 MG SL SUBL: 0.1250 mg | SUBLINGUAL_TABLET | SUBLINGUAL | 2 refills | Status: DC | PRN

## 2017-08-03 MED ORDER — OMEPRAZOLE 40 MG PO CPDR: 40.0000 mg | DELAYED_RELEASE_CAPSULE | Freq: Every day | ORAL | 2 refills | Status: DC

## 2017-08-03 NOTE — Progress Notes (Signed)
Chief Complaint: Crohn's Disease, Epigastric pain  HPI:    [name] is a 26 year old AA female with a past  medical history as listed below, including a new diagnosis of Crohn's disease, recent pancreatitis, cholecystectomy, cocaine abuse and vaginal delivery, who was referred to me by Latoya Bien, MD for a complaint of Crohn's flare abdominal pain.      Patient was initially seen by Dr. Silverio [name] in the hospital 07/04/17 for abdominal pain and bloody diarrhea.  It was noted the patient had a history of substance abuse and drug-seeking behavior as well as recent uncomplicated vaginal delivery on 05/15/17.  Patient did have a relapse on cocaine and a brief inpatient psych treatment for substance abuse and major depression in early October.  Apparently, when discharged from behavioral health she was told that she would not be receiving any opiate medications again.  CT abdomen pelvis at that time showed a nonspecific pancolitis with a right mesenteric adenopathy, cholelithiasis and no ductal dilatation.  Patient was finally discharged on 07/13/17 after improvement on IV steroids.  Mesalamine was started during her stay but seemed to worsen her symptoms after only 1 dose so this was discontinued.  Patient also had a diagnosis of acute pancreatitis which was presumed biliary or "reactive" due to severe colitis.  Patient was discharged to [facility] where she had a cholecystectomy.  She had repeat CT there on 07/14/17 which showed a small right pleural effusion, diffuse fatty infiltration of liver, cholelithiasis and changes of acute pancreatitis without complicating features.  There is also small amount of free fluid in the abdomen and pelvis which is likely reactive and nonobstructing stones in the right kidney.  She had diffuse wall thickening of the colon most prominent in the rectosigmoid and descending region.  As well as prominent mesenteric lymph nodes.    Patient was seen in the ER 07/31/17  for continued abdominal pain and emesis.  She improved with pain meds and antiemetics.  Labs at that time showed a CMP with a potassium of 3.4, alk phos elevated at 134 and total protein of 8.2.  CBC showed a white count of 28 and hemoglobin of 10.4.  White count was not elevated due to Prednisone use.  Patient was discharged with follow-up in our office.    Today, the patient presents to clinic and explains that she continues with abdominal pain, this is "all over and all the time".  Sometimes this pain is worse in her right upper quadrant and up underneath her diaphragm, like "right now", but other times it is not.  Patient tells me that taking 2 or 3 sips of liquid or food hurts and sometimes this pain will get somewhat better if she pushes herself to keep eating.  Patient does use Rolaids a lot for which she describes as heartburn and keeps them in her purse.      Patient has developed a cold and coughing which is new for her over the past week and was recently diagnosed with bronchitis per review of outside records.    Currently,  the patient is on Prednisone taper.  She was started at 60 mg daily for 7 days and is currently on 50 mg daily for 7 days and will taper by 10 mg over 7 days as written.  Patient was also given pain medicine at recent ER visit a couple of days ago for her continued abdominal pain.  Labs at that time were as above.  Patient tells  me she has no further pain medicine left only using this at night to help her sleep because her intestine seems to "spasm" while she is sleeping.  Overall, the patient reports that her bowel movements are much better and she has 2-3 formed bowel movements per day and is not seeing any further bleeding.    Patient denies fever, chills, blood in her stool, melena, weight loss, anorexia, nausea or vomiting.  Past Medical History:  Diagnosis Date  . Anemia   . Anxiety   . Depression   . Drug-seeking behavior   . Gestational diabetes   . History of  substance abuse   . HSV infection   . Pregnancy induced hypertension     Past Surgical History:  Procedure Laterality Date  . DEEP NECK LYMPH NODE BIOPSY / EXCISION  2011    Current Outpatient Medications  Medication Sig Dispense Refill  . acetaminophen (TYLENOL) 500 MG tablet Take 1,000 mg every 6 (six) hours as needed by mouth for mild pain.    Marland Kitchen HYDROcodone-acetaminophen (NORCO/VICODIN) 5-325 MG tablet Take 1 tablet every 6 (six) hours as needed by mouth. 5 tablet 0  . PARoxetine (PAXIL-CR) 25 MG 24 hr tablet Take 1 tablet (25 mg total) by mouth daily. 31 tablet 12  . predniSONE (DELTASONE) 10 MG tablet 6 tabs po daily x 7 days 5 tabs po daily x 7 days 4 tabs po daily x 7 days 3 tabs po daily x 7 days 2 tabs po daily x 7 days 1 tab po daily x 7 days half tab po daily x 7 days     No current facility-administered medications for this visit.     Allergies as of 08/03/2017  . (No Known Allergies)    Family History  Problem Relation Age of Onset  . Diabetes Mother   . Hypertension Mother   . Hypertension Father   . Stroke Maternal Grandfather   . Colon cancer Neg Hx   . Stomach cancer Neg Hx     Social History   Socioeconomic History  . Marital status: Single    Spouse name: Not on file  . Number of children: 2  . Years of education: Not on file  . Highest education level: Not on file  Social Needs  . Financial resource strain: Not on file  . Food insecurity - worry: Not on file  . Food insecurity - inability: Not on file  . Transportation needs - medical: Not on file  . Transportation needs - non-medical: Not on file  Occupational History  . Not on file  Tobacco Use  . Smoking status: Never Smoker  . Smokeless tobacco: Never Used  Substance and Sexual Activity  . Alcohol use: No    Comment: "not for a while"  . Drug use: Yes    Types: "Crack" cocaine, Cocaine    Comment: cocai, former user  . Sexual activity: Not Currently    Partners: Male    Birth  control/protection: None  Other Topics Concern  . Not on file  Social History Narrative  . Not on file    Review of Systems:    Constitutional: No weight loss, fever or chills Skin: No rash  Cardiovascular: No chest pain Respiratory: No SOB  Gastrointestinal: See HPI and otherwise negative Genitourinary: No dysuria  Neurological: No headache Musculoskeletal: No new muscle or joint pain Hematologic: No bleeding  Psychiatric: Positive for anxiety and depression    Physical Exam:  Vital signs: BP 124/70  Pulse (!) 114   Ht 5' 9" (1.753 m)   Wt 168 lb (76.2 kg)   BMI 24.81 kg/m    Constitutional:   AA female appears to be in NAD, Well developed, Well nourished, alert and cooperative Head:  Normocephalic and atraumatic. Eyes:   PEERL, EOMI. No icterus. Conjunctiva pink. Ears:  Normal auditory acuity. Neck:  Supple Throat: Oral cavity and pharynx without inflammation, swelling or lesion.  Respiratory: Respirations even and unlabored. Lungs clear to auscultation bilaterally.   No wheezes, crackles, or rhonchi.  Cardiovascular: Normal S1, S2. No MRG. Regular rate and rhythm. No peripheral edema, cyanosis or pallor.  Gastrointestinal:  Soft, nondistended, moderately tender over entire abdomen, somewhat worse in the right upper quadrant, no rebound or guarding. Normal bowel sounds. No appreciable masses or hepatomegaly. Rectal:  Not performed.  Msk:  Symmetrical without gross deformities. Without edema, no deformity or joint abnormality.  Neurologic:  Alert and  oriented x4;  grossly normal neurologically.  Skin:   Dry and intact without significant lesions or rashes. Psychiatric:  Demonstrates good judgement and reason without abnormal affect or behaviors.   RELEVANT LABS AND IMAGING: CBC    Component Value Date/Time   WBC 28.0 (H) 07/31/2017 2132   RBC 3.76 (L) 07/31/2017 2132   HGB 10.4 (L) 07/31/2017 2132   HGB 11.0 (L) 03/06/2017 0914   HGB 12.6 08/19/2015   HCT 32.9  (L) 07/31/2017 2132   HCT 32.4 (L) 03/06/2017 0914   PLT 627 (H) 07/31/2017 2132   PLT 293 03/06/2017 0914   PLT 327 08/19/2015   MCV 87.5 07/31/2017 2132   MCV 83 03/06/2017 0914   MCH 27.7 07/31/2017 2132   MCHC 31.6 07/31/2017 2132   RDW 18.5 (H) 07/31/2017 2132   RDW 14.8 03/06/2017 0914   LYMPHSABS 1.8 07/04/2017 0823   LYMPHSABS 2.3 01/10/2017 1355   MONOABS 1.8 (H) 07/04/2017 0823   EOSABS 0.0 07/04/2017 0823   EOSABS 0.0 01/10/2017 1355   BASOSABS 0.0 07/04/2017 0823   BASOSABS 0.0 01/10/2017 1355    CMP     Component Value Date/Time   NA 139 07/31/2017 2132   NA 137 01/10/2017 1355   K 3.4 (L) 07/31/2017 2132   CL 103 07/31/2017 2132   CO2 24 07/31/2017 2132   GLUCOSE 125 (H) 07/31/2017 2132   BUN 12 07/31/2017 2132   BUN 5 (L) 01/10/2017 1355   CREATININE 0.72 07/31/2017 2132   CALCIUM 9.6 07/31/2017 2132   PROT 8.2 (H) 07/31/2017 2132   PROT 6.8 01/10/2017 1355   ALBUMIN 3.8 07/31/2017 2132   ALBUMIN 4.1 01/10/2017 1355   AST 17 07/31/2017 2132   ALT 46 07/31/2017 2132   ALKPHOS 134 (H) 07/31/2017 2132   BILITOT 0.8 07/31/2017 2132   BILITOT <0.2 01/10/2017 1355   GFRNONAA >60 07/31/2017 2132   GFRAA >60 07/31/2017 2132   Result Date: 07/14/2017 CLINICAL DATA: Abdominal pain starting 1 week ago. Recent diagnosis and admission at [facility] for pancreatitis and colitis. Lower GI bleed. Eight weeks postpartum. EXAM: CT ABDOMEN AND PELVIS WITH CONTRAST TECHNIQUE: Multidetector CT imaging of the abdomen and pelvis was performed using the standard protocol following bolus administration of intravenous contrast. CONTRAST: 100 mL Omnipaque 350 COMPARISON: None. FINDINGS: Lower chest: Small right pleural effusion with basilar atelectasis. Hepatobiliary: Diffuse fatty infiltration of the liver. Cholelithiasis with several stones in the gallbladder including a stone in the gallbladder neck. No gallbladder wall thickening. Bile ducts are not dilated. Pancreas: Diffuse  fullness throughout the pancreas with peripancreatic edema consistent with acute pancreatitis. No pancreatic ductal dilatation. Homogeneous pancreatic enhancement without evidence of necrosis. No loculated fluid collections. Fluid and edema extends along the pericolic gutters with small amount of free fluid in the abdomen and pelvis, likely reactive. Spleen: Normal in size without focal abnormality. Adrenals/Urinary Tract: 2 mm stones in the upper and midpole right kidney. Nephrograms are symmetrical. No hydronephrosis or hydroureter. Bladder is unremarkable. Stomach/Bowel: Stomach, small bowel, and colon are not abnormally distended. Colon demonstrates wall thickening vertically in the rectosigmoid and descending portions. This is consistent with colitis and could represent infectious or inflammatory etiologies. Appendix is normal. Vascular/Lymphatic: Normal caliber abdominal aorta. No aortic aneurysm. Prominent lymph nodes in the mesenteric and right lower quadrant are likely reactive. Reproductive: Uterus and ovaries are not enlarged. Other: No free air in the abdomen. Abdominal wall musculature appears intact. Musculoskeletal: No acute or significant osseous findings. IMPRESSION: 1. Small right pleural effusion. 2. Diffuse fatty infiltration of the liver. 3. Cholelithiasis. 4. Changes of acute pancreatitis without complicating feature. 5. Small amount of free fluid in the abdomen and pelvis is likely reactive. 6. Nonobstructing stones in the right kidney. 7. Diffuse wall thickening of the colon most prominent in the rectosigmoid and descending region. This is consistent with colitis, either infectious or inflammatory etiologies suggested. 8. Prominent mesenteric lymph nodes are likely reactive. Electronically Signed By: Lucienne Capers M.D. On: 07/14/2017 03:54   Assessment: 1.  Colitis: Colitis with ulceration via biopsies from colonoscopy 07/07/17, patient had recent cocaine use at that time, question  relation to this vs Crohn's disease versus other, patient worsened on Mesalamine for 1 day in the hospital, so this was stopped, improving on Prednisone, currently on taper by 10 mg every 7 days 2.  Abdominal pain: Generalized but somewhat worse in the right upper quadrant, consider relation to recent cholecystectomy versus continued pancreatitis versus other 3.  Epigastric abdominal pain: Every time the patient eats or drinks.  Consider relation to pancreatitis versus possible reflux versus other 4.  Cocaine abuse  Plan: 1.  Patient has had multiple recent CTs.  She has also had multiple health problems recently including acute pancreatitis, vaginal delivery, cholecystectomy and colitis with recent use of cocaine.  Ordered an abdominal MRI with contrast for further evaluation of continued abdominal pain to consider any complications from processes above. 2.  Prescribed Hyoscyamine 0.125 mg every 4-6 hours as needed for abdominal pain.  Also discussed with the patient that she can use this scheduled 4 times a day, 20-30 minutes before eating and at bedtime. 3.  Prescribed Omeprazole 40 mg once daily, 30-60 minutes before eating #30 with 2 refills 4.  Patient to continue Prednisone taper by 10 mg every 7 days 5.  Patient to follow with Dr. Silverio [name] in the clinic in 4 weeks or sooner if necessary.  Ellouise Newer, PA-C [facility] 08/03/2017, 1:42 PM  Cc: Latoya Bien, MD

## 2017-08-03 NOTE — Patient Instructions (Addendum)
Your physician has requested that you go to the basement for lab work before leaving today and again in 2 weeks.   We have sent the following medications to your pharmacy for you to pick up at your convenience:  Omeprazole 40 mg daily  Hyoscyamine 0.125 mg four times a day before meals and at bedtime

## 2017-08-07 ENCOUNTER — Other Ambulatory Visit: Payer: Self-pay | Admitting: Emergency Medicine

## 2017-08-07 DIAGNOSIS — K859 Acute pancreatitis without necrosis or infection, unspecified: Secondary | ICD-10-CM

## 2017-08-10 ENCOUNTER — Ambulatory Visit (HOSPITAL_COMMUNITY): Admission: RE | Admit: 2017-08-10 | Payer: BLUE CROSS/BLUE SHIELD | Source: Ambulatory Visit

## 2017-08-10 ENCOUNTER — Telehealth: Payer: Self-pay | Admitting: Emergency Medicine

## 2017-08-10 NOTE — Telephone Encounter (Signed)
We can not speak with her mother if we do not have DPR for her. We also can not giev pain medication. She can try taking 1-2 hyoscyamine q4-6 hours. If pain worsens she will need to proceed to ER. Thanks-JLL

## 2017-08-10 NOTE — Telephone Encounter (Signed)
Left message on patients voicemail to call office.   

## 2017-08-10 NOTE — Telephone Encounter (Signed)
Patient states that she is having abdominal pain still with hyoscyamine even after eating. Patient states that it is ok to talk to her mom even though she is not on her DPR because she is at work and she does not have a cell phone.

## 2017-08-13 NOTE — Progress Notes (Signed)
Reviewed and agree with documentation and assessment and plan. K. Veena Nandigam , MD   

## 2017-08-20 ENCOUNTER — Other Ambulatory Visit: Payer: Self-pay | Admitting: Physician Assistant

## 2017-08-20 ENCOUNTER — Ambulatory Visit (HOSPITAL_COMMUNITY)
Admission: RE | Admit: 2017-08-20 | Discharge: 2017-08-20 | Disposition: A | Discharge status: Home / Self Care | Payer: BLUE CROSS/BLUE SHIELD | Source: Ambulatory Visit | Attending: Physician Assistant | Admitting: Physician Assistant

## 2017-08-20 DIAGNOSIS — K859 Acute pancreatitis without necrosis or infection, unspecified: Secondary | ICD-10-CM | POA: Diagnosis not present

## 2017-08-20 DIAGNOSIS — Z9049 Acquired absence of other specified parts of digestive tract: Secondary | ICD-10-CM | POA: Diagnosis not present

## 2017-08-20 MED ORDER — GADOBENATE DIMEGLUMINE 529 MG/ML IV SOLN
20.0000 mL | Freq: Once | INTRAVENOUS | Status: AC | PRN
  Administered 2017-08-20: 18 mL via INTRAVENOUS

## 2017-08-29 ENCOUNTER — Other Ambulatory Visit: Payer: Self-pay

## 2017-08-29 ENCOUNTER — Telehealth: Payer: Self-pay | Admitting: Gastroenterology

## 2017-08-29 MED ORDER — MAGIC MOUTHWASH W/LIDOCAINE
5.0000 mL | Freq: Three times a day (TID) | ORAL | 0 refills | Status: DC | PRN
Start: 2017-08-29 — End: 2017-11-01

## 2017-08-29 NOTE — Telephone Encounter (Signed)
2 day history of white ulcers in her mouth. Burn with food. Sore feeling. No GI symptoms. Otherwise feeling well. Can she have Magic mouthwash or other. No white coating of her tongue. No cough or fever.

## 2017-08-29 NOTE — Telephone Encounter (Signed)
Discussed with the patient. She is in agreement with the plan. Requests [facility] [address].

## 2017-08-29 NOTE — Telephone Encounter (Signed)
Ok to use magic mouthwash 5cc TID as needed.

## 2017-09-05 ENCOUNTER — Ambulatory Visit: Payer: Self-pay | Admitting: Gastroenterology

## 2017-11-01 ENCOUNTER — Encounter: Payer: Self-pay | Admitting: Gastroenterology

## 2017-11-01 ENCOUNTER — Ambulatory Visit (INDEPENDENT_AMBULATORY_CARE_PROVIDER_SITE_OTHER): Payer: BLUE CROSS/BLUE SHIELD | Admitting: Gastroenterology

## 2017-11-01 VITALS — BP 120/80 | HR 88 | Ht 69.0 in | Wt 196.4 lb

## 2017-11-01 DIAGNOSIS — K5100 Ulcerative (chronic) pancolitis without complications: Secondary | ICD-10-CM | POA: Diagnosis not present

## 2017-11-01 MED ORDER — MESALAMINE ER 0.375 G PO CP24: 1.5000 mg | ORAL_CAPSULE | Freq: Every day | ORAL | 3 refills | Status: DC

## 2017-11-01 MED ORDER — PEG-KCL-NACL-NASULF-NA ASC-C 140 G PO SOLR: 1.0000 | ORAL | 0 refills | Status: DC

## 2017-11-01 NOTE — Patient Instructions (Addendum)
We will send in [place] to your pharmacy  You have been scheduled for a colonoscopy. Please follow written instructions given to you at your visit today.  Please pick up your prep supplies at the pharmacy within the next 1-3 days. If you use inhalers (even only as needed), please bring them with you on the day of your procedure.   If you are age 25 or older, your body mass index should be between 23-30. Your Body mass index is 29 kg/m. If this is out of the aforementioned range listed, please consider follow up with your Primary Care Provider.  If you are age 32 or younger, your body mass index should be between 19-25. Your Body mass index is 29 kg/m. If this is out of the aformentioned range listed, please consider follow up with your Primary Care Provider.

## 2017-11-01 NOTE — Progress Notes (Signed)
Latoya [name]    [id number]    [birth year]  Primary Care Physician:Dewey, Mechele Claude, MD  Referring Physician: Fanny Bien, MD [address] [place], [place] [address]  Chief complaint: Colitis  HPI: 27 year old female here for follow-up visit of colitis.  She was hospitalized in October 2018 with abdominal pain, diarrhea and blood in stool.  CT abdomen and pelvis showed pancolitis with right mesenteric adenopathy. She had colonoscopy by Dr. Collene Mares which showed severe pan colitis with ulceration, biopsies showed active colitis with ulceration but no chronic features of the crypt distortion.  There was some granulation associated with the ulcer. She also had acute mild pancreatitis with no pancreatic necrosis in October 2018.  Follow-up MRI abdomen with contrast showed normal pancreatico biliary tree and resolution of pancreatitis with no sequelae or complications. Patient was using cocaine around the time from August to October and urine tox screen was positive for cocaine.  She improved with prednisone taper and is currently on mesalamine daily Denies any blood in stool or increased bowel frequency.  She has intermittent abdominal discomfort, better when she is  takes hyoscyamine and also mesalamine She denies current active use of cocaine.  No dysphagia, odynophagia, nausea, vomiting or blood per rectum. No family history of IBD.   Outpatient Encounter Medications as of 11/01/2017  Medication Sig  . acetaminophen (TYLENOL) 500 MG tablet Take 1,000 mg every 6 (six) hours as needed by mouth for mild pain.  Marland Kitchen omeprazole (PRILOSEC) 40 MG capsule Take 1 capsule (40 mg total) daily by mouth.  Marland Kitchen PARoxetine (PAXIL-CR) 25 MG 24 hr tablet Take 1 tablet (25 mg total) by mouth daily.  . [DISCONTINUED] HYDROcodone-acetaminophen (NORCO/VICODIN) 5-325 MG tablet Take 1 tablet every 6 (six) hours as needed by mouth.  . hyoscyamine (LEVSIN SL) 0.125 MG SL tablet Place 1 tablet  (0.125 mg total) every 4 (four) hours as needed under the tongue. (Patient not taking: Reported on 11/01/2017)  . PEG-KCl-NaCl-NaSulf-Na Asc-C (PLENVU) 140 g SOLR Take 1 kit by mouth as directed.  . [DISCONTINUED] magic mouthwash w/lidocaine SOLN Take 5 mLs by mouth 3 (three) times daily as needed for mouth pain. (Patient not taking: Reported on 11/01/2017)  . [DISCONTINUED] mesalamine (APRISO) 0.375 g 24 hr capsule Take 1 capsule (0.375 g total) by mouth daily.  . [DISCONTINUED] predniSONE (DELTASONE) 10 MG tablet 6 tabs po daily x 7 days 5 tabs po daily x 7 days 4 tabs po daily x 7 days 3 tabs po daily x 7 days 2 tabs po daily x 7 days 1 tab po daily x 7 days half tab po daily x 7 days   No facility-administered encounter medications on file as of 11/01/2017.     Allergies as of 11/01/2017  . (No Known Allergies)    Past Medical History:  Diagnosis Date  . Anemia   . Anxiety   . Crohn's disease ([place])   . Depression   . Drug-seeking behavior   . Gestational diabetes   . History of substance abuse   . HSV infection   . Pregnancy induced hypertension     Past Surgical History:  Procedure Laterality Date  . CHOLECYSTECTOMY    . COLONOSCOPY N/A 07/07/2017   Procedure: COLONOSCOPY;  Surgeon: Juanita Craver, MD;  Location: [facility] ENDOSCOPY;  Service: Endoscopy;  Laterality: N/A;  . DEEP NECK LYMPH NODE BIOPSY / EXCISION  2011    Family History  Problem  Relation Age of Onset  . Diabetes Mother   . Hypertension Mother   . Hypertension Father   . Stroke Maternal Grandfather   . Colon cancer Neg Hx   . Stomach cancer Neg Hx     Social History   Socioeconomic History  . Marital status: Single    Spouse name: Not on file  . Number of children: 2  . Years of education: Not on file  . Highest education level: Not on file  Social Needs  . Financial resource strain: Not on file  . Food insecurity - worry: Not on file  . Food insecurity - inability: Not on file  . Transportation  needs - medical: Not on file  . Transportation needs - non-medical: Not on file  Occupational History  . Not on file  Tobacco Use  . Smoking status: Never Smoker  . Smokeless tobacco: Never Used  Substance and Sexual Activity  . Alcohol use: No    Comment: "not for a while"  . Drug use: Yes    Types: "Crack" cocaine, Cocaine    Comment: cocai, former user  . Sexual activity: Not Currently    Partners: Male    Birth control/protection: None  Other Topics Concern  . Not on file  Social History Narrative  . Not on file      Review of systems: Review of Systems  Constitutional: Negative for fever and chills.  HENT: Negative.   Eyes: Negative for blurred vision.  Respiratory: Negative for cough, shortness of breath and wheezing.   Cardiovascular: Negative for chest pain and palpitations.  Gastrointestinal: as per HPI Genitourinary: Negative for dysuria, urgency, frequency and hematuria.  Musculoskeletal: Negative for myalgias, back pain and joint pain.  Skin: Negative for itching and rash.  Neurological: Negative for dizziness, tremors, focal weakness, seizures and loss of consciousness.  Endo/Heme/Allergies: Positive for seasonal allergies.  Psychiatric/Behavioral: Negative for depression, suicidal ideas and hallucinations.  All other systems reviewed and are negative.   Physical Exam: Vitals:   11/01/17 1131  BP: 120/80  Pulse: 88   Body mass index is 29 kg/m. Gen:      No acute distress HEENT:  EOMI, sclera anicteric Neck:     No masses; no thyromegaly Lungs:    Clear to auscultation bilaterally; normal respiratory effort CV:         Regular rate and rhythm; no murmurs Abd:      + bowel sounds; soft, non-tender; no palpable masses, no distension Ext:    No edema; adequate peripheral perfusion Skin:      Warm and dry; no rash Neuro: alert and oriented x 3 Psych: normal mood and affect  Data Reviewed:  Reviewed labs, radiology imaging, old records and  pertinent past GI work up   Assessment and Plan/Recommendations:  27 year old female with pancolitis, substance abuse (cocaine) Reviewed the biopsy results, had evidence of active inflammation but no chronic features.  She had pancolitis while she was actively using cocaine and it is unclear if patient does have underlying IBD or not She is currently asymptomatic and denies active cocaine abuse We will schedule for colonoscopy with biopsies to evaluate Continue mesalamine  Return after colonoscopy The risks and benefits as well as alternatives of endoscopic procedure(s) have been discussed and reviewed. All questions answered. The patient agrees to proceed.     Damaris Hippo , MD [phone] Mon-Fri 8a-5p [phone] after 5p, weekends, holidays  CC: Fanny Bien, MD

## 2017-11-07 ENCOUNTER — Telehealth: Payer: Self-pay | Admitting: Gastroenterology

## 2017-11-07 ENCOUNTER — Ambulatory Visit (AMBULATORY_SURGERY_CENTER): Payer: BLUE CROSS/BLUE SHIELD | Admitting: Gastroenterology

## 2017-11-07 ENCOUNTER — Encounter: Payer: Self-pay | Admitting: Gastroenterology

## 2017-11-07 ENCOUNTER — Other Ambulatory Visit: Payer: Self-pay

## 2017-11-07 VITALS — BP 149/100 | HR 76 | Temp 98.0°F | Resp 14 | Ht 69.0 in | Wt 196.0 lb

## 2017-11-07 DIAGNOSIS — K5100 Ulcerative (chronic) pancolitis without complications: Secondary | ICD-10-CM

## 2017-11-07 MED ORDER — SODIUM CHLORIDE 0.9 % IV SOLN: 500.0000 mL | Freq: Once | INTRAVENOUS | Status: DC

## 2017-11-07 MED ORDER — MAGIC MOUTHWASH W/LIDOCAINE: 5.0000 mL | Freq: Four times a day (QID) | ORAL | 0 refills | Status: DC | PRN

## 2017-11-07 NOTE — Progress Notes (Signed)
A and O x3. Report to RN. Tolerated MAC anesthesia well.

## 2017-11-07 NOTE — Progress Notes (Signed)
Called to room to assist during endoscopic procedure.  Patient ID and intended procedure confirmed with present staff. Received instructions for my participation in the procedure from the performing physician.  

## 2017-11-07 NOTE — Telephone Encounter (Signed)
Lidocaine 5cc upto q6h as needed. Please advise patient to avoid eating or drinking for 1-2 hours after lidocaine to prevent aspiration. thanks

## 2017-11-07 NOTE — Op Note (Signed)
[place] Patient Name: Latoya [name] Procedure Date: 11/07/2017 2:38 PM MRN: [id number] Endoscopist: Mauri Pole , MD Age: 27 Referring MD:  Date of Birth: 16-Dec-1990 Gender: Female Account #: [id number] Procedure:                Flexible Sigmoidoscopy Indications:              Follow-up of chronic ulcerative pancolitis, Disease                            activity assessment of chronic ulcerative pancolitis Medicines:                Monitored Anesthesia Care Procedure:                Pre-Anesthesia Assessment:                           - Prior to the procedure, a History and Physical                            was performed, and patient medications and                            allergies were reviewed. The patient's tolerance of                            previous anesthesia was also reviewed. The risks                            and benefits of the procedure and the sedation                            options and risks were discussed with the patient.                            All questions were answered, and informed consent                            was obtained. Prior Anticoagulants: The patient has                            taken no previous anticoagulant or antiplatelet                            agents. ASA Grade Assessment: II - A patient with                            mild systemic disease. After reviewing the risks                            and benefits, the patient was deemed in                            satisfactory condition to undergo the procedure.  After obtaining informed consent, the scope was                            passed under direct vision. The Colonoscope was                            introduced through the anus and advanced to the the                            descending colon. After obtaining informed consent,                            the scope was passed under direct vision.The        flexible sigmoidoscopy was accomplished without                            difficulty. The patient tolerated the procedure                            well. The quality of the bowel preparation was poor. Scope In: 2:53:13 PM Scope Out: 3:04:56 PM Total Procedure Duration: 0 hours 11 minutes 43 seconds  Findings:                 The perianal and digital rectal examinations were                            normal.                           Mucosa with altered vascular pattern and patchy                            scar tissue was found in the rectum, in the sigmoid                            colon and in the descending colon in the visualized                            area Biopsies were taken with a cold forceps for                            histology. Complications:            No immediate complications. Estimated Blood Loss:     Estimated blood loss was minimal. Impression:               - Preparation of the colon was poor.                           - Altered vascular pattern with scar tissue in the                            rectum, in the sigmoid colon and in the descending  colon. Biopsied. Recommendation:           - Resume previous diet.                           - Continue present medications.                           - No aspirin, ibuprofen, naproxen, or other                            non-steroidal anti-inflammatory drugs.                           - Await pathology results.                           - Return to GI clinic at the next available                            appointment. Mauri Pole, MD 11/07/2017 3:14:05 PM This report has been signed electronically.

## 2017-11-07 NOTE — Patient Instructions (Signed)
Discharge instructions given. Biopsies taken. Resume previous medications. YOU HAD AN ENDOSCOPIC PROCEDURE TODAY AT [facility]:   Refer to the procedure report that was given to you for any specific questions about what was found during the examination.  If the procedure report does not answer your questions, please call your gastroenterologist to clarify.  If you requested that your care partner not be given the details of your procedure findings, then the procedure report has been included in a sealed envelope for you to review at your convenience later.  YOU SHOULD EXPECT: Some feelings of bloating in the abdomen. Passage of more gas than usual.  Walking can help get rid of the air that was put into your GI tract during the procedure and reduce the bloating. If you had a lower endoscopy (such as a colonoscopy or flexible sigmoidoscopy) you may notice spotting of blood in your stool or on the toilet paper. If you underwent a bowel prep for your procedure, you may not have a normal bowel movement for a few days.  Please Note:  You might notice some irritation and congestion in your nose or some drainage.  This is from the oxygen used during your procedure.  There is no need for concern and it should clear up in a day or so.  SYMPTOMS TO REPORT IMMEDIATELY:   Following lower endoscopy (colonoscopy or flexible sigmoidoscopy):  Excessive amounts of blood in the stool  Significant tenderness or worsening of abdominal pains  Swelling of the abdomen that is new, acute  Fever of 100F or higher   For urgent or emergent issues, a gastroenterologist can be reached at any hour by calling [phone].   DIET:  We do recommend a small meal at first, but then you may proceed to your regular diet.  Drink plenty of fluids but you should avoid alcoholic beverages for 24 hours.  ACTIVITY:  You should plan to take it easy for the rest of today and you should NOT DRIVE or use heavy  machinery until tomorrow (because of the sedation medicines used during the test).    FOLLOW UP: Our staff will call the number listed on your records the next business day following your procedure to check on you and address any questions or concerns that you may have regarding the information given to you following your procedure. If we do not reach you, we will leave a message.  However, if you are feeling well and you are not experiencing any problems, there is no need to return our call.  We will assume that you have returned to your regular daily activities without incident.  If any biopsies were taken you will be contacted by phone or by letter within the next 1-3 weeks.  Please call us at [phone] if you have not heard about the biopsies in 3 weeks.    SIGNATURES/CONFIDENTIALITY: You and/or your care partner have signed paperwork which will be entered into your electronic medical record.  These signatures attest to the fact that that the information above on your After Visit Summary has been reviewed and is understood.  Full responsibility of the confidentiality of this discharge information lies with you and/or your care-partner.

## 2017-11-07 NOTE — Telephone Encounter (Signed)
The ingredients "Nystatin" is on back order they need to know the qty of  Lidocaine that needs to be in the bottle of solution    And the ingredients you want     Mycostatin =  30 ml  Hydrocortisone =  60 ml  Benadryl  =  180 ml  Lidocaine = 30 ml   +  ml  Total   [place] is out of the Nystatin and Mycostatin solution  Manufacturer back order    Tried to call patient no answer

## 2017-11-07 NOTE — Telephone Encounter (Signed)
Please advise on this.  

## 2017-11-08 ENCOUNTER — Other Ambulatory Visit: Payer: Self-pay

## 2017-11-08 ENCOUNTER — Telehealth: Payer: Self-pay | Admitting: *Deleted

## 2017-11-08 ENCOUNTER — Telehealth: Payer: Self-pay

## 2017-11-08 NOTE — Telephone Encounter (Signed)
Attempted to reach patient for post-procedure f/u call (2nd attempt). No answer. Left message for her to call us if she has any questions/concerns regarding her care.

## 2017-11-08 NOTE — Telephone Encounter (Signed)
  Follow up Call-  Call back number 11/07/2017  Post procedure Call Back phone  # [phone]  Permission to leave phone message Yes  Some recent data might be hidden    [facility]

## 2017-11-18 ENCOUNTER — Other Ambulatory Visit: Payer: Self-pay | Admitting: Physician Assistant

## 2017-11-19 ENCOUNTER — Other Ambulatory Visit: Payer: Self-pay

## 2017-11-22 ENCOUNTER — Other Ambulatory Visit: Payer: Self-pay

## 2017-11-26 ENCOUNTER — Telehealth: Payer: Self-pay

## 2017-11-26 ENCOUNTER — Other Ambulatory Visit: Payer: Self-pay

## 2017-11-26 MED ORDER — MESALAMINE ER 0.375 G PO CP24: 1.5000 g | ORAL_CAPSULE | Freq: Every day | ORAL | 3 refills | Status: DC

## 2017-11-26 NOTE — Telephone Encounter (Signed)
Patient calling back about her biopsy results. She is advised she has mildly active chronic colitis. She has a follow up appointment in April. Rx to [facility] on [address].

## 2018-01-16 ENCOUNTER — Ambulatory Visit: Payer: Self-pay | Admitting: Gastroenterology

## 2018-01-26 ENCOUNTER — Emergency Department (HOSPITAL_COMMUNITY): Payer: Medicaid Other

## 2018-01-26 ENCOUNTER — Emergency Department (HOSPITAL_COMMUNITY)
Admission: EM | Admit: 2018-01-26 | Discharge: 2018-01-26 | Discharge status: Against Medical Advice | Payer: Medicaid Other | Attending: Emergency Medicine | Admitting: Emergency Medicine

## 2018-01-26 ENCOUNTER — Encounter (HOSPITAL_COMMUNITY): Payer: Self-pay | Admitting: Emergency Medicine

## 2018-01-26 ENCOUNTER — Other Ambulatory Visit: Payer: Self-pay

## 2018-01-26 DIAGNOSIS — Z79899 Other long term (current) drug therapy: Secondary | ICD-10-CM | POA: Diagnosis not present

## 2018-01-26 DIAGNOSIS — Z5329 Procedure and treatment not carried out because of patient's decision for other reasons: Secondary | ICD-10-CM | POA: Insufficient documentation

## 2018-01-26 DIAGNOSIS — R69 Illness, unspecified: Secondary | ICD-10-CM

## 2018-01-26 DIAGNOSIS — R1011 Right upper quadrant pain: Secondary | ICD-10-CM | POA: Diagnosis present

## 2018-01-26 DIAGNOSIS — J111 Influenza due to unidentified influenza virus with other respiratory manifestations: Secondary | ICD-10-CM | POA: Insufficient documentation

## 2018-01-26 LAB — COMPREHENSIVE METABOLIC PANEL
ALT: 21 U/L (ref 14–54)
AST: 21 U/L (ref 15–41)
Albumin: 4.5 g/dL (ref 3.5–5.0)
Alkaline Phosphatase: 64 U/L (ref 38–126)
Anion gap: 13 (ref 5–15)
BUN: 5 mg/dL — ABNORMAL LOW (ref 6–20)
CO2: 16 mmol/L — ABNORMAL LOW (ref 22–32)
Calcium: 9.7 mg/dL (ref 8.9–10.3)
Chloride: 105 mmol/L (ref 101–111)
Creatinine, Ser: 0.69 mg/dL (ref 0.44–1.00)
GFR calc Af Amer: >60 mL/min (ref >60)
GFR calc non Af Amer: >60 mL/min (ref >60)
Glucose, Bld: 152 mg/dL — ABNORMAL HIGH (ref 65–99)
Potassium: 3.1 mmol/L — ABNORMAL LOW (ref 3.5–5.1)
Sodium: 134 mmol/L — ABNORMAL LOW (ref 135–145)
Total Bilirubin: 0.4 mg/dL (ref 0.3–1.2)
Total Protein: 8.3 g/dL — ABNORMAL HIGH (ref 6.5–8.1)

## 2018-01-26 LAB — URINALYSIS, ROUTINE W REFLEX MICROSCOPIC
Bilirubin Urine: NEGATIVE
Glucose, UA: NEGATIVE mg/dL
Hgb urine dipstick: NEGATIVE
Ketones, ur: 5 mg/dL — AB
Leukocytes, UA: NEGATIVE
Nitrite: NEGATIVE
Protein, ur: 100 mg/dL — AB
Specific Gravity, Urine: 1.036 — ABNORMAL HIGH (ref 1.005–1.030)
pH: 5 (ref 5.0–8.0)

## 2018-01-26 LAB — CBC WITH DIFFERENTIAL/PLATELET
Basophils Absolute: 0 10*3/uL (ref 0.0–0.1)
Basophils Relative: 0 %
Eosinophils Absolute: 0 10*3/uL (ref 0.0–0.7)
Eosinophils Relative: 0 %
HCT: 35.1 % — ABNORMAL LOW (ref 36.0–46.0)
Hemoglobin: 11.2 g/dL — ABNORMAL LOW (ref 12.0–15.0)
Lymphocytes Relative: 4 %
Lymphs Abs: 0.7 10*3/uL (ref 0.7–4.0)
MCH: 26.7 pg (ref 26.0–34.0)
MCHC: 31.9 g/dL (ref 30.0–36.0)
MCV: 83.6 fL (ref 78.0–100.0)
Monocytes Absolute: 0.7 10*3/uL (ref 0.1–1.0)
Monocytes Relative: 4 %
Neutro Abs: 14.8 10*3/uL — ABNORMAL HIGH (ref 1.7–7.7)
Neutrophils Relative %: 92 %
Platelets: 400 10*3/uL (ref 150–400)
RBC: 4.2 MIL/uL (ref 3.87–5.11)
RDW: 15 % (ref 11.5–15.5)
WBC: 16.2 10*3/uL — ABNORMAL HIGH (ref 4.0–10.5)

## 2018-01-26 LAB — INFLUENZA PANEL BY PCR (TYPE A & B)
Influenza A By PCR: NEGATIVE
Influenza B By PCR: NEGATIVE

## 2018-01-26 LAB — I-STAT BETA HCG BLOOD, ED (MC, WL, AP ONLY): I-stat hCG, quantitative: <5 m[IU]/mL (ref <5)

## 2018-01-26 LAB — I-STAT CG4 LACTIC ACID, ED
Lactic Acid, Venous: 2.87 mmol/L (ref 0.5–1.9)
Lactic Acid, Venous: 1.84 mmol/L (ref 0.5–1.9)

## 2018-01-26 LAB — PROTIME-INR
INR: 1.2
Prothrombin Time: 15.1 seconds (ref 11.4–15.2)

## 2018-01-26 MED ORDER — LACTATED RINGERS IV BOLUS (SEPSIS)
1000.0000 mL | Freq: Once | INTRAVENOUS | Status: AC
  Administered 2018-01-26: 1000 mL via INTRAVENOUS

## 2018-01-26 MED ORDER — ACETAMINOPHEN 325 MG PO TABS
650.0000 mg | ORAL_TABLET | Freq: Once | ORAL | Status: AC
  Administered 2018-01-26: 650 mg via ORAL
  Filled 2018-01-26: qty 2

## 2018-01-26 MED ORDER — IOHEXOL 300 MG/ML  SOLN
100.0000 mL | Freq: Once | INTRAMUSCULAR | Status: AC | PRN
  Administered 2018-01-26: 100 mL via INTRAVENOUS

## 2018-01-26 MED ORDER — PIPERACILLIN-TAZOBACTAM 3.375 G IVPB 30 MIN
3.3750 g | Freq: Once | INTRAVENOUS | Status: AC
  Administered 2018-01-26: 3.375 g via INTRAVENOUS
  Filled 2018-01-26: qty 50

## 2018-01-26 MED ORDER — ACETAMINOPHEN 325 MG PO TABS
650.0000 mg | ORAL_TABLET | Freq: Once | ORAL | Status: DC
  Filled 2018-01-26: qty 2

## 2018-01-26 MED ORDER — PIPERACILLIN-TAZOBACTAM 3.375 G IVPB: 3.3750 g | Freq: Three times a day (TID) | INTRAVENOUS | Status: DC

## 2018-01-26 NOTE — Progress Notes (Signed)
ANTIBIOTIC CONSULT NOTE - INITIAL  Pharmacy Consult for Zosyn Indication: Abdominal infection  No Known Allergies  Patient Measurements: Height: 5' 9"  (175.3 cm) Weight: 210 lb (95.3 kg) IBW/kg (Calculated) : 66.2 Adjusted Body Weight:    Vital Signs: Temp: 100.7 F (38.2 C) (05/04 1229) Temp Source: Oral (05/04 1229) BP: 120/79 (05/04 1056) Pulse Rate: 136 (05/04 1056) Intake/Output from previous day: No intake/output data recorded. Intake/Output from this shift: No intake/output data recorded.  Labs: Recent Labs    01/26/18 1116  WBC 16.2*  HGB 11.2*  PLT 400  CREATININE 0.69   Estimated Creatinine Clearance: 133.2 mL/min (by C-G formula based on SCr of 0.69 mg/dL). No results for input(s): VANCOTROUGH, VANCOPEAK, VANCORANDOM, GENTTROUGH, GENTPEAK, GENTRANDOM, TOBRATROUGH, TOBRAPEAK, TOBRARND, AMIKACINPEAK, AMIKACINTROU, AMIKACIN in the last 72 hours.   Microbiology: No results found for this or any previous visit (from the past 720 hour(s)).  Medical History: Past Medical History:  Diagnosis Date  . Anemia   . Anxiety   . Arthritis   . Blood transfusion without reported diagnosis   . Crohn's disease ([place])   . Depression   . Drug-seeking behavior   . Gestational diabetes   . History of substance abuse   . HSV infection   . Pregnancy induced hypertension   . Substance abuse ([place])    Assessment: CC/HPI: Crohn's flare. Unable to get medications - Tmax 102.9, HR 136, RR 18, BP WNL  PMH: Anemia, anxiety, arthritis, Crohn's, depression, drug-seeking behaviors, substance abuse, HSV  ID: Intra-abdominal infection. LA 2.87, WBC 16.2. Start Zosyn  Goal of Therapy:  Eradication of infection  Plan:   Zosyn 3.375g IV q 8hrs  Aleck Locklin S. Alford [place], PharmD, BCPS Clinical Staff Pharmacist Pager [phone]  Eilene Ghazi Stillinger 01/26/2018,12:44 PM

## 2018-01-26 NOTE — ED Notes (Signed)
Dr. Eulis Foster made aware.

## 2018-01-26 NOTE — ED Triage Notes (Signed)
Pt. Stated, Im having a flare up of my crohn's disease cause I don't have the insurance for my doctor or the medication. Started this morning. My appt was in April and I couldn't get the medication

## 2018-01-26 NOTE — ED Notes (Signed)
Advised Dr. Eulis Foster will be in to see patient.

## 2018-01-26 NOTE — ED Provider Notes (Addendum)
[facility] EMERGENCY DEPARTMENT Provider Note   CSN: [id number] Arrival date & time: 01/26/18  1043     History   Chief Complaint Chief Complaint  Patient presents with  . Back Pain  . Crohn's Disease    HPI Latoya [name] is a 27 y.o. female.   HPI  She complains of generalized achiness with abdominal pain and sore throat which started today.  She is worried that her Crohn's disease is flaring because she is out of her Crohn's medication due to an insurance snafu.  She did not know that she had a fever.  She did not eat or drink much today.  She denies rhinorrhea, ear pain, cough, anterior chest pain, back pain, weakness or dizziness.  Past Medical History:  Diagnosis Date  . Anemia   . Anxiety   . Arthritis   . Blood transfusion without reported diagnosis   . Crohn's disease ([place])   . Depression   . Drug-seeking behavior   . Gestational diabetes   . History of substance abuse   . HSV infection   . Pregnancy induced hypertension   . Substance abuse [facility])     Patient Active Problem List   Diagnosis Date Noted  . Post partum depression 07/12/2017  . IBD (inflammatory bowel disease)   . Acute pancreatitis   . Iron deficiency anemia due to chronic blood loss   . Heme positive stool   . Pancolitis ([place]) 07/04/2017  . Generalized abdominal pain   . Diarrhea   . Major depressive disorder, recurrent severe without psychotic features ([place]) 06/29/2017  . Cocaine abuse ([place]) 06/29/2017  . SVD (spontaneous vaginal delivery) 05/15/2017  . Crack cocaine use 05/15/2017  . Cervical dysplasia 01/16/2017  . Obesity (BMI 30.0-34.9) 01/12/2017  . History of substance abuse 01/12/2017  . History of gestational diabetes mellitus (GDM) 01/12/2017  . History of gestational hypertension 01/12/2017  . History of macrosomia in infant in prior pregnancy, currently pregnant 01/12/2017  . Short interval between pregnancies affecting pregnancy in second trimester,  antepartum 01/12/2017  . Late prenatal care 01/12/2017  . Paxil use in early pregnancy 01/12/2017  . History of suicide attempt 01/12/2017  . Anxiety and depression 01/12/2017  . Supervision of high risk pregnancy, antepartum 01/10/2017  . GDM (gestational diabetes mellitus) 02/25/2016  . Obesity in pregnancy 02/25/2016    Past Surgical History:  Procedure Laterality Date  . CHOLECYSTECTOMY    . COLONOSCOPY N/A 07/07/2017   Procedure: COLONOSCOPY;  Surgeon: Juanita Craver, MD;  [facility];  Service: Endoscopy;  Laterality: N/A;  . DEEP NECK LYMPH NODE BIOPSY / EXCISION  2011     OB History    Gravida  2   Para  2   Term  2   Preterm  0   AB  0   Living  2     SAB  0   TAB  0   Ectopic  0   Multiple  0   Live Births  2            Home Medications    Prior to Admission medications   Medication Sig Start Date End Date Taking? Authorizing Provider  acetaminophen (TYLENOL) 500 MG tablet Take 1,000 mg every 6 (six) hours as needed by mouth for mild pain.   Yes [provider]  mesalamine (APRISO) 0.375 g 24 hr capsule Take 4 capsules (1.5 g total) by mouth daily. 11/26/17  Yes Mauri Pole, MD  PARoxetine (PAXIL-CR) 25 MG 24 hr tablet Take 1 tablet (25 mg total) by mouth daily. 05/16/17  Yes Dove, Myra C, MD  hyoscyamine (LEVSIN SL) 0.125 MG SL tablet Place 1 tablet (0.125 mg total) every 4 (four) hours as needed under the tongue. Patient not taking: Reported on 11/01/2017 08/03/17   Levin Erp, PA  magic mouthwash w/lidocaine SOLN Take 5 mLs by mouth 4 (four) times daily as needed for mouth pain. Patient not taking: Reported on 01/26/2018 11/07/17   Mauri Pole, MD  omeprazole (PRILOSEC) 40 MG capsule TAKE 1 CAPSULE (40 MG TOTAL) DAILY BY MOUTH. Patient not taking: Reported on 01/26/2018 11/19/17   Mauri Pole, MD    Family History Family History  Problem Relation Age of Onset  . Diabetes Mother   . Hypertension  Mother   . Hypertension Father   . Stroke Maternal Grandfather   . Colon cancer Neg Hx   . Stomach cancer Neg Hx     Social History Social History   Tobacco Use  . Smoking status: Never Smoker  . Smokeless tobacco: Never Used  Substance Use Topics  . Alcohol use: No    Comment: "not for a while"  . Drug use: Yes    Types: "Crack" cocaine, Cocaine    Comment: cocai, former user, 06/27/2017     Allergies   Patient has no known allergies.   Review of Systems Review of Systems   Physical Exam Updated Vital Signs BP 139/89   Pulse (!) 102   Temp 98.6 F (37 C) (Oral)   Resp 18   Ht 5' 9"  (1.753 m)   Wt 95.3 kg (210 lb)   LMP 11/26/2017 Comment: irregular periods  SpO2 100%   BMI 31.01 kg/m   Physical Exam  Constitutional: She is oriented to person, place, and time. She appears well-developed and well-nourished.  HENT:  Head: Normocephalic and atraumatic.  Eyes: Pupils are equal, round, and reactive to light. Conjunctivae and EOM are normal.  Neck: Normal range of motion and phonation normal. Neck supple.  Cardiovascular: Normal rate and regular rhythm.  Pulmonary/Chest: Effort normal and breath sounds normal. She exhibits no tenderness.  Abdominal: Soft. She exhibits no distension. There is tenderness (Right upper quadrant, mild). There is no guarding.  Musculoskeletal: Normal range of motion. She exhibits no edema or deformity.  Neurological: She is alert and oriented to person, place, and time. She exhibits normal muscle tone.  Skin: Skin is warm and dry.  Psychiatric: She has a normal mood and affect. Her behavior is normal. Judgment and thought content normal.  Nursing note and vitals reviewed.    ED Treatments / Results  Labs (all labs ordered are listed, but only abnormal results are displayed) Labs Reviewed  COMPREHENSIVE METABOLIC PANEL - Abnormal; Notable for the following components:      Result Value   Sodium 134 (*)    Potassium 3.1 (*)     CO2 16 (*)    Glucose, Bld 152 (*)    BUN 5 (*)    Total Protein 8.3 (*)    All other components within normal limits  CBC WITH DIFFERENTIAL/PLATELET - Abnormal; Notable for the following components:   WBC 16.2 (*)    Hemoglobin 11.2 (*)    HCT 35.1 (*)    Neutro Abs 14.8 (*)    All other components within normal limits  URINALYSIS, ROUTINE W REFLEX MICROSCOPIC - Abnormal; Notable for the following components:   Specific Gravity,  Urine 1.036 (*)    Ketones, ur 5 (*)    Protein, ur 100 (*)    Bacteria, UA RARE (*)    All other components within normal limits  I-STAT CG4 LACTIC ACID, ED - Abnormal; Notable for the following components:   Lactic Acid, Venous 2.87 (*)    All other components within normal limits  CULTURE, BLOOD (ROUTINE X 2)  CULTURE, BLOOD (ROUTINE X 2)  URINE CULTURE  PROTIME-INR  INFLUENZA PANEL BY PCR (TYPE A & B)  I-STAT BETA HCG BLOOD, ED ([facility], [facility], [facility] ONLY)  I-STAT CG4 LACTIC ACID, ED    EKG None  Radiology Dg Chest 2 View  Result Date: 01/26/2018 CLINICAL DATA:  Fever EXAM: CHEST - 2 VIEW COMPARISON:  08/01/2017 chest radiograph. FINDINGS: Stable cardiomediastinal silhouette with normal heart size. No pneumothorax. No pleural effusion. Lungs appear clear, with no acute consolidative airspace disease and no pulmonary edema. Cholecystectomy clips are seen in the right upper quadrant of the abdomen. IMPRESSION: No active cardiopulmonary disease. Electronically Signed   By: Ilona Sorrel M.D.   On: 01/26/2018 11:44   Ct Abdomen Pelvis W Contrast  Result Date: 01/26/2018 CLINICAL DATA:  27 year old female with history of Crohn's disease presenting with abdominal pain. EXAM: CT ABDOMEN AND PELVIS WITH CONTRAST TECHNIQUE: Multidetector CT imaging of the abdomen and pelvis was performed using the standard protocol following bolus administration of intravenous contrast. CONTRAST:  124m OMNIPAQUE IOHEXOL 300 MG/ML  SOLN COMPARISON:  CT the abdomen and pelvis 07/09/2017.  FINDINGS: Lower chest: Unremarkable. Hepatobiliary: No suspicious cystic or solid hepatic lesions. No intra or extrahepatic biliary ductal dilatation. Status post cholecystectomy. Pancreas: No pancreatic mass. No pancreatic ductal dilatation. No pancreatic or peripancreatic fluid or inflammatory changes. Spleen: Unremarkable. Adrenals/Urinary Tract: 4 mm nonobstructive calculus in the interpolar collecting system of the right kidney. No suspicious renal lesions. Bilateral adrenal glands are normal in appearance. No hydroureteronephrosis. Urinary bladder is normal in appearance. Stomach/Bowel: The appearance of the stomach is normal. There is no pathologic dilatation of small bowel or colon. No focal areas of mural thickening are noted in associations with the stomach, small bowel or colon to suggest sites of active disease. Normal appendix. Vascular/Lymphatic: No significant atherosclerotic disease, aneurysm or dissection noted in the abdominal or pelvic vasculature. No lymphadenopathy noted in the abdomen or pelvis. Reproductive: Uterus and ovaries are unremarkable in appearance. Other: No significant volume of ascites.  No pneumoperitoneum. Musculoskeletal: There are no aggressive appearing lytic or blastic lesions noted in the visualized portions of the skeleton. IMPRESSION: 1. No findings to suggest active Crohn's disease in the abdomen or pelvis. 2. 4 mm nonobstructive calculus in the interpolar collecting system of the right kidney. No ureteral stones or findings of urinary tract obstruction are noted at this time. 3. Status post cholecystectomy. Electronically Signed   By: DVinnie LangtonM.D.   On: 01/26/2018 13:55    Procedures .Critical Care Performed by: WDaleen Bo MD Authorized by: WDaleen Bo MD   Critical care provider statement:    Critical care time (minutes):  35   Critical care start time:  01/26/2018 12:25 PM   Critical care end time:  01/26/2018 4:49 PM   Critical care time was  exclusive of:  Separately billable procedures and treating other patients   Critical care was necessary to treat or prevent imminent or life-threatening deterioration of the following conditions:  Sepsis   Critical care was time spent personally by me on the following activities:  Blood  draw for specimens, development of treatment plan with patient or surrogate, discussions with consultants, evaluation of patient's response to treatment, examination of patient, obtaining history from patient or surrogate, ordering and performing treatments and interventions, ordering and review of laboratory studies, pulse oximetry, re-evaluation of patient's condition, review of old charts and ordering and review of radiographic studies   (including critical care time)  Medications Ordered in ED Medications  piperacillin-tazobactam (ZOSYN) IVPB 3.375 g (has no administration in time range)  acetaminophen (TYLENOL) tablet 650 mg (has no administration in time range)  acetaminophen (TYLENOL) tablet 650 mg (650 mg Oral Given 01/26/18 1122)  lactated ringers bolus 1,000 mL (0 mLs Intravenous Stopped 01/26/18 1413)    And  lactated ringers bolus 1,000 mL (1,000 mLs Intravenous New Bag/Given 01/26/18 1343)    And  lactated ringers bolus 1,000 mL (1,000 mLs Intravenous New Bag/Given 01/26/18 1405)  piperacillin-tazobactam (ZOSYN) IVPB 3.375 g (0 g Intravenous Stopped 01/26/18 1403)  iohexol (OMNIPAQUE) 300 MG/ML solution 100 mL (100 mLs Intravenous Contrast Given 01/26/18 1309)     Initial Impression / Assessment and Plan / ED Course  I have reviewed the triage vital signs and the nursing notes.  Pertinent labs & imaging results that were available during my care of the patient were reviewed by me and considered in my medical decision making (see chart for details).  Clinical Course as of Jan 26 1633  Sat Jan 26, 2018  1628 Normal  I-Stat CG4 Lactic Acid, ED [EW]  1628 Normal  Influenza panel by PCR (type A & B) [EW]    1628 Normal except elevated specific gravity, ketones and protein.  Urinalysis, Routine w reflex microscopic(!) [EW]  1628 normal  I-Stat beta hCG blood, ED [EW]  1629 Normal except low sodium, potassium, CO2, BUN.  Elevated glucose and total protein.  Comprehensive metabolic panel(!) [EW]  6789 Elevated white blood cell count 16.2, low hemoglobin 11.2  CBC with Differential(!) [EW]  1629 No acute disease, images reviewed.  DG Chest 2 View [EW]  3810 No evidence for Crohn's flare, or obstructing kidney stone.  Images reviewed  CT Abdomen Pelvis W Contrast [EW]    Clinical Course User Index [EW] Daleen Bo, MD     Patient Vitals for the past 24 hrs:  BP Temp Temp src Pulse Resp SpO2 Height Weight  01/26/18 1543 - - - (!) 102 - 100 % - -  01/26/18 1542 139/89 - - - - - - -  01/26/18 1407 - 98.6 F (37 C) Oral - - - - -  01/26/18 1403 128/89 - - (!) 111 - 100 % - -  01/26/18 1229 - (!) 100.7 F (38.2 C) Oral - - - - -  01/26/18 1115 - - - - - - 5' 9"  (1.753 m) 95.3 kg (210 lb)  01/26/18 1056 120/79 (!) 102.9 F (39.4 C) Oral (!) 136 18 96 % - -    4:49 PM Reevaluation with update and discussion. After initial assessment and treatment, an updated evaluation reveals patient has removed her IV and indicated to the nurse that she would be leaving, [place].  She is not currently in the room. [place] decision making-patient with fever, and reassuring evaluation.  Patient treated with empiric antibiotics and high-volume IV bolus, during evaluation.  Influenza test negative.  Urinalysis and chest x-ray normal.  CT abdomen pelvis, for evaluation of possible Crohn's disease negative for colitis or inflammatory process.  Nonobstructing right kidney  stone present.  Initial mild lactate elevation improved with IV fluids.  Doubt severe sepsis or metabolic instability.  Fever likely elevated due to influenza-like syndrome.   Nursing Notes Reviewed/ Care  Coordinated Applicable Imaging Reviewed Interpretation of Laboratory Data incorporated into ED treatment   [place]   Final Clinical Impressions(s) / ED Diagnoses   Final diagnoses:  Influenza-like illness    ED Discharge Orders    None       Daleen Bo, MD 01/26/18 1700    Daleen Bo, MD 02/02/18 1346

## 2018-01-26 NOTE — ED Notes (Signed)
This nurse went to patient room.  Pt in bathroom.  Pt has pulled IV's out and is dressed.

## 2018-01-26 NOTE — ED Notes (Signed)
Pt no longer in room.

## 2018-01-27 LAB — URINE CULTURE

## 2018-01-28 ENCOUNTER — Emergency Department (HOSPITAL_COMMUNITY)
Admission: EM | Admit: 2018-01-28 | Discharge: 2018-01-28 | Disposition: A | Discharge status: Home / Self Care | Payer: Medicaid Other | Attending: Emergency Medicine | Admitting: Emergency Medicine

## 2018-01-28 ENCOUNTER — Other Ambulatory Visit: Payer: Self-pay

## 2018-01-28 ENCOUNTER — Encounter (HOSPITAL_COMMUNITY): Payer: Self-pay | Admitting: *Deleted

## 2018-01-28 DIAGNOSIS — R102 Pelvic and perineal pain: Secondary | ICD-10-CM | POA: Insufficient documentation

## 2018-01-28 DIAGNOSIS — R070 Pain in throat: Secondary | ICD-10-CM | POA: Diagnosis present

## 2018-01-28 DIAGNOSIS — Z79899 Other long term (current) drug therapy: Secondary | ICD-10-CM | POA: Diagnosis not present

## 2018-01-28 DIAGNOSIS — J020 Streptococcal pharyngitis: Secondary | ICD-10-CM | POA: Insufficient documentation

## 2018-01-28 LAB — COMPREHENSIVE METABOLIC PANEL
ALT: 17 U/L (ref 14–54)
AST: 12 U/L — ABNORMAL LOW (ref 15–41)
Albumin: 4.3 g/dL (ref 3.5–5.0)
Alkaline Phosphatase: 71 U/L (ref 38–126)
Anion gap: 10 (ref 5–15)
BUN: 10 mg/dL (ref 6–20)
CO2: 23 mmol/L (ref 22–32)
Calcium: 9.6 mg/dL (ref 8.9–10.3)
Chloride: 111 mmol/L (ref 101–111)
Creatinine, Ser: 0.61 mg/dL (ref 0.44–1.00)
GFR calc Af Amer: >60 mL/min (ref >60)
GFR calc non Af Amer: >60 mL/min (ref >60)
Glucose, Bld: 95 mg/dL (ref 65–99)
Potassium: 3.3 mmol/L — ABNORMAL LOW (ref 3.5–5.1)
Sodium: 144 mmol/L (ref 135–145)
Total Bilirubin: 0.4 mg/dL (ref 0.3–1.2)
Total Protein: 9 g/dL — ABNORMAL HIGH (ref 6.5–8.1)

## 2018-01-28 LAB — CBC
HCT: 35.8 % — ABNORMAL LOW (ref 36.0–46.0)
Hemoglobin: 11.1 g/dL — ABNORMAL LOW (ref 12.0–15.0)
MCH: 26.5 pg (ref 26.0–34.0)
MCHC: 31 g/dL (ref 30.0–36.0)
MCV: 85.4 fL (ref 78.0–100.0)
Platelets: 464 10*3/uL — ABNORMAL HIGH (ref 150–400)
RBC: 4.19 MIL/uL (ref 3.87–5.11)
RDW: 15.3 % (ref 11.5–15.5)
WBC: 10.2 10*3/uL (ref 4.0–10.5)

## 2018-01-28 LAB — URINALYSIS, ROUTINE W REFLEX MICROSCOPIC
Bilirubin Urine: NEGATIVE
Glucose, UA: NEGATIVE mg/dL
Ketones, ur: NEGATIVE mg/dL
Nitrite: NEGATIVE
Protein, ur: 30 mg/dL — AB
RBC / HPF: >50 RBC/hpf — ABNORMAL HIGH (ref 0–5)
Specific Gravity, Urine: 1.02 (ref 1.005–1.030)
pH: 5 (ref 5.0–8.0)

## 2018-01-28 LAB — LIPASE, BLOOD: Lipase: 24 U/L (ref 11–51)

## 2018-01-28 LAB — I-STAT BETA HCG BLOOD, ED (MC, WL, AP ONLY): I-stat hCG, quantitative: <5 m[IU]/mL (ref <5)

## 2018-01-28 LAB — GROUP A STREP BY PCR: Group A Strep by PCR: DETECTED — AB

## 2018-01-28 MED ORDER — PENICILLIN G BENZATHINE 1200000 UNIT/2ML IM SUSP
1.2000 10*6.[IU] | Freq: Once | INTRAMUSCULAR | Status: AC
  Administered 2018-01-28: 1.2 10*6.[IU] via INTRAMUSCULAR
  Filled 2018-01-28: qty 2

## 2018-01-28 NOTE — ED Notes (Signed)
Patient is not in room for discharge instructions

## 2018-01-28 NOTE — Discharge Instructions (Addendum)
Drink plenty of fluids.  Take Tylenol , ibuprofen as needed for sore throat.  You were given a shot of penicillin in the emergency room and this should treat your strep throat

## 2018-01-28 NOTE — ED Provider Notes (Signed)
[facility] DEPT Provider Note   CSN: [id number] Arrival date & time: 01/28/18  1222     History   Chief Complaint Chief Complaint  Patient presents with  . Abdominal Pain    Crohn's Flare up  . Sore Throat    HPI Latoya [name] is a 27 y.o. female.  HPI She presents to the emergency room for evaluation of a sore throat.  Symptoms started a few days ago.  It hurts for her to swallow.  The pain also goes up into her ear.  Patient also had been having some trouble with abdominal pain and a few loose stools.  Patient has a history of Crohn's disease and has not been able to take her medications so she was worried about a possible Crohn's flare.  Patient was seen in the emergency room on May 4.  Patient had an evaluation including a CT scan of her abdomen and pelvis.  Patient left before she was able to discuss the results. Past Medical History:  Diagnosis Date  . Anemia   . Anxiety   . Arthritis   . Blood transfusion without reported diagnosis   . Crohn's disease ([place])   . Depression   . Drug-seeking behavior   . Gestational diabetes   . History of substance abuse   . HSV infection   . Pregnancy induced hypertension   . Substance abuse [facility])     Patient Active Problem List   Diagnosis Date Noted  . Post partum depression 07/12/2017  . IBD (inflammatory bowel disease)   . Acute pancreatitis   . Iron deficiency anemia due to chronic blood loss   . Heme positive stool   . Pancolitis ([place]) 07/04/2017  . Generalized abdominal pain   . Diarrhea   . Major depressive disorder, recurrent severe without psychotic features ([place]) 06/29/2017  . Cocaine abuse ([place]) 06/29/2017  . SVD (spontaneous vaginal delivery) 05/15/2017  . Crack cocaine use 05/15/2017  . Cervical dysplasia 01/16/2017  . Obesity (BMI 30.0-34.9) 01/12/2017  . History of substance abuse 01/12/2017  . History of gestational diabetes mellitus (GDM) 01/12/2017  . History of  gestational hypertension 01/12/2017  . History of macrosomia in infant in prior pregnancy, currently pregnant 01/12/2017  . Short interval between pregnancies affecting pregnancy in second trimester, antepartum 01/12/2017  . Late prenatal care 01/12/2017  . Paxil use in early pregnancy 01/12/2017  . History of suicide attempt 01/12/2017  . Anxiety and depression 01/12/2017  . Supervision of high risk pregnancy, antepartum 01/10/2017  . GDM (gestational diabetes mellitus) 02/25/2016  . Obesity in pregnancy 02/25/2016    Past Surgical History:  Procedure Laterality Date  . CHOLECYSTECTOMY    . COLONOSCOPY N/A 07/07/2017   Procedure: COLONOSCOPY;  Surgeon: Juanita Craver, MD;  Location: [facility] ENDOSCOPY;  Service: Endoscopy;  Laterality: N/A;  . DEEP NECK LYMPH NODE BIOPSY / EXCISION  2011     OB History    Gravida  2   Para  2   Term  2   Preterm  0   AB  0   Living  2     SAB  0   TAB  0   Ectopic  0   Multiple  0   Live Births  2            Home Medications    Prior to Admission medications   Medication Sig Start Date End Date Taking? Authorizing Provider  acetaminophen (TYLENOL) 500 MG tablet Take  1,000 mg every 6 (six) hours as needed by mouth for mild pain.   Yes [provider]  mesalamine (APRISO) 0.375 g 24 hr capsule Take 4 capsules (1.5 g total) by mouth daily. 11/26/17  Yes Nandigam, Venia Minks, MD  PARoxetine (PAXIL-CR) 25 MG 24 hr tablet Take 1 tablet (25 mg total) by mouth daily. 05/16/17  Yes Dove, Myra C, MD  hyoscyamine (LEVSIN SL) 0.125 MG SL tablet Place 1 tablet (0.125 mg total) every 4 (four) hours as needed under the tongue. Patient not taking: Reported on 11/01/2017 08/03/17   Levin Erp, PA  magic mouthwash w/lidocaine SOLN Take 5 mLs by mouth 4 (four) times daily as needed for mouth pain. Patient not taking: Reported on 01/26/2018 11/07/17   Mauri Pole, MD  omeprazole (PRILOSEC) 40 MG capsule TAKE 1 CAPSULE (40 MG  TOTAL) DAILY BY MOUTH. Patient not taking: Reported on 01/26/2018 11/19/17   Mauri Pole, MD    Family History Family History  Problem Relation Age of Onset  . Diabetes Mother   . Hypertension Mother   . Hypertension Father   . Stroke Maternal Grandfather   . Colon cancer Neg Hx   . Stomach cancer Neg Hx     Social History Social History   Tobacco Use  . Smoking status: Never Smoker  . Smokeless tobacco: Never Used  Substance Use Topics  . Alcohol use: No    Comment: "not for a while"  . Drug use: Yes    Types: "Crack" cocaine, Cocaine    Comment: cocai, former user, 06/27/2017     Allergies   Patient has no known allergies.   Review of Systems Review of Systems  All other systems reviewed and are negative.    Physical Exam Updated Vital Signs BP (!) 135/97 (BP Location: Left Arm)   Pulse (!) 119   Temp 99.1 F (37.3 C) (Oral)   Resp 18   Ht 1.753 m (5' 9" )   Wt 95.3 kg (210 lb)   LMP 01/27/2018   SpO2 98%   BMI 31.01 kg/m   Physical Exam  Constitutional: She appears well-developed and well-nourished. No distress.  HENT:  Head: Normocephalic and atraumatic.  Right Ear: External ear normal.  Left Ear: External ear normal.  Mouth/Throat: No trismus in the jaw. No uvula swelling. Posterior oropharyngeal erythema present. No oropharyngeal exudate.  Eyes: Conjunctivae are normal. Right eye exhibits no discharge. Left eye exhibits no discharge. No scleral icterus.  Neck: Neck supple. No tracheal deviation present.  Cardiovascular: Regular rhythm and intact distal pulses. Tachycardia present.  Pulmonary/Chest: Effort normal and breath sounds normal. No stridor. No respiratory distress. She has no wheezes. She has no rales.  Abdominal: Soft. Bowel sounds are normal. She exhibits no distension. There is no tenderness. There is no rebound and no guarding.  Musculoskeletal: She exhibits no edema or tenderness.  Neurological: She is alert. She has normal  strength. No cranial nerve deficit (no facial droop, extraocular movements intact, no slurred speech) or sensory deficit. She exhibits normal muscle tone. She displays no seizure activity. Coordination normal.  Skin: Skin is warm and dry. No rash noted.  Psychiatric: She has a normal mood and affect.  Nursing note and vitals reviewed.    ED Treatments / Results  Labs (all labs ordered are listed, but only abnormal results are displayed) Labs Reviewed  GROUP A STREP BY PCR - Abnormal; Notable for the following components:      Result Value  Group A Strep by PCR DETECTED (*)    All other components within normal limits  COMPREHENSIVE METABOLIC PANEL - Abnormal; Notable for the following components:   Potassium 3.3 (*)    Total Protein 9.0 (*)    AST 12 (*)    All other components within normal limits  CBC - Abnormal; Notable for the following components:   Hemoglobin 11.1 (*)    HCT 35.8 (*)    Platelets 464 (*)    All other components within normal limits  URINALYSIS, ROUTINE W REFLEX MICROSCOPIC - Abnormal; Notable for the following components:   APPearance HAZY (*)    Hgb urine dipstick LARGE (*)    Protein, ur 30 (*)    Leukocytes, UA MODERATE (*)    RBC / HPF >50 (*)    Bacteria, UA RARE (*)    All other components within normal limits  LIPASE, BLOOD  I-STAT BETA HCG BLOOD, ED ([facility], [facility], [facility] ONLY)    Procedures Procedures (including critical care time)  Medications Ordered in ED Medications  penicillin g benzathine (BICILLIN LA) 1200000 UNIT/2ML injection 1.2 Million Units (1.2 Million Units Intramuscular Given 01/28/18 1952)     Initial Impression / Assessment and Plan / ED Course  I have reviewed the triage vital signs and the nursing notes.  Pertinent labs & imaging results that were available during my care of the patient were reviewed by me and considered in my medical decision making (see chart for details).  Clinical Course as of Jan 29 1955  Mon Jan 28, 2018   6047 Urine appears contaminated.  Pt is on menses.  Blood tests reassuring.  Strep is positive   [JK]    Clinical Course User Index [JK] Dorie Rank, MD    Patient presented to the emergency room for evaluation of a sore throat.  Her test results were notable for positive strep test.  Patient is tolerating oral fluids.  She is not having any difficulty speaking.  No evidence of abscess.  Patient requests penicillin IM.  I did review her CT scan results from yesterday.  There is no evidence of any acute abnormality.  No signs of a Crohn's flare.  Patient's laboratory tests are otherwise reassuring.  Very symptoms.  I doubt infection.  Patient appears stable for discharge.  Final Clinical Impressions(s) / ED Diagnoses   Final diagnoses:  Strep pharyngitis    ED Discharge Orders    None       Dorie Rank, MD 01/28/18 1956

## 2018-01-28 NOTE — ED Triage Notes (Signed)
Pt reports abd paint hat is consistent with a crohn's flare up. Pt also reports having a sore throat where it hurts to swallow.  Pt also reports ear pain.  Pt reports being seen at [facility] yesterday but left prior to being discharged.  Pt reports not being able to take her crohn's medication d/t money issues with her insurance.

## 2018-01-29 ENCOUNTER — Telehealth: Payer: Self-pay | Admitting: Emergency Medicine

## 2018-01-29 NOTE — Telephone Encounter (Signed)
Post ED Visit - Positive Culture Follow-up  Culture report reviewed by antimicrobial stewardship pharmacist:  []  Elenor Quinones, Pharm.D. []  Heide Guile, Pharm.D., BCPS AQ-ID []  Parks Neptune, Pharm.D., BCPS []  Alycia Rossetti, Pharm.D., BCPS []  [place], Pharm.D., BCPS, AAHIVP []  Legrand Como, Pharm.D., BCPS, AAHIVP []  Salome Arnt, PharmD, BCPS [x]  Wynell Balloon, PharmD []  Vincenza Hews, PharmD, BCPS  Positive strep culture Treated with bicillin, organism sensitive to the same and no further patient follow-up is required at this time.  Hazle Nordmann 01/29/2018, 4:28 PM

## 2018-01-31 LAB — CULTURE, BLOOD (ROUTINE X 2)
Culture: NO GROWTH
Culture: NO GROWTH
Special Requests: ADEQUATE
Special Requests: ADEQUATE

## 2018-07-03 ENCOUNTER — Emergency Department (HOSPITAL_COMMUNITY): Payer: Medicaid Other

## 2018-07-03 ENCOUNTER — Emergency Department (HOSPITAL_COMMUNITY)
Admission: EM | Admit: 2018-07-03 | Discharge: 2018-07-03 | Disposition: A | Discharge status: Home / Self Care | Payer: Medicaid Other | Attending: Emergency Medicine | Admitting: Emergency Medicine

## 2018-07-03 ENCOUNTER — Encounter (HOSPITAL_COMMUNITY): Payer: Self-pay | Admitting: Emergency Medicine

## 2018-07-03 DIAGNOSIS — Z79899 Other long term (current) drug therapy: Secondary | ICD-10-CM | POA: Insufficient documentation

## 2018-07-03 DIAGNOSIS — R058 Other specified cough: Secondary | ICD-10-CM

## 2018-07-03 DIAGNOSIS — R05 Cough: Secondary | ICD-10-CM | POA: Diagnosis present

## 2018-07-03 DIAGNOSIS — B349 Viral infection, unspecified: Secondary | ICD-10-CM | POA: Diagnosis not present

## 2018-07-03 DIAGNOSIS — J111 Influenza due to unidentified influenza virus with other respiratory manifestations: Secondary | ICD-10-CM | POA: Diagnosis not present

## 2018-07-03 DIAGNOSIS — R69 Illness, unspecified: Secondary | ICD-10-CM

## 2018-07-03 LAB — COMPREHENSIVE METABOLIC PANEL
ALT: 15 U/L (ref 0–44)
AST: 16 U/L (ref 15–41)
Albumin: 4.8 g/dL (ref 3.5–5.0)
Alkaline Phosphatase: 52 U/L (ref 38–126)
Anion gap: 8 (ref 5–15)
BUN: 8 mg/dL (ref 6–20)
CO2: 27 mmol/L (ref 22–32)
Calcium: 10.1 mg/dL (ref 8.9–10.3)
Chloride: 106 mmol/L (ref 98–111)
Creatinine, Ser: 0.7 mg/dL (ref 0.44–1.00)
GFR calc Af Amer: >60 mL/min (ref >60)
GFR calc non Af Amer: >60 mL/min (ref >60)
Glucose, Bld: 105 mg/dL — ABNORMAL HIGH (ref 70–99)
Potassium: 3.3 mmol/L — ABNORMAL LOW (ref 3.5–5.1)
Sodium: 141 mmol/L (ref 135–145)
Total Bilirubin: 0.7 mg/dL (ref 0.3–1.2)
Total Protein: 8.1 g/dL (ref 6.5–8.1)

## 2018-07-03 LAB — URINALYSIS, ROUTINE W REFLEX MICROSCOPIC
Bacteria, UA: NONE SEEN
Bilirubin Urine: NEGATIVE
Glucose, UA: NEGATIVE mg/dL
Ketones, ur: NEGATIVE mg/dL
Nitrite: NEGATIVE
Protein, ur: NEGATIVE mg/dL
Specific Gravity, Urine: 1.01 (ref 1.005–1.030)
pH: 8 (ref 5.0–8.0)

## 2018-07-03 LAB — CBC WITH DIFFERENTIAL/PLATELET
Abs Immature Granulocytes: 0.03 10*3/uL (ref 0.00–0.07)
Basophils Absolute: 0 10*3/uL (ref 0.0–0.1)
Basophils Relative: 0 %
Eosinophils Absolute: 0 10*3/uL (ref 0.0–0.5)
Eosinophils Relative: 0 %
HCT: 36.8 % (ref 36.0–46.0)
Hemoglobin: 11.6 g/dL — ABNORMAL LOW (ref 12.0–15.0)
Immature Granulocytes: 0 %
Lymphocytes Relative: 7 %
Lymphs Abs: 0.6 10*3/uL — ABNORMAL LOW (ref 0.7–4.0)
MCH: 27.2 pg (ref 26.0–34.0)
MCHC: 31.5 g/dL (ref 30.0–36.0)
MCV: 86.4 fL (ref 80.0–100.0)
Monocytes Absolute: 0.8 10*3/uL (ref 0.1–1.0)
Monocytes Relative: 9 %
Neutro Abs: 8 10*3/uL — ABNORMAL HIGH (ref 1.7–7.7)
Neutrophils Relative %: 84 %
Platelets: 359 10*3/uL (ref 150–400)
RBC: 4.26 MIL/uL (ref 3.87–5.11)
RDW: 12.3 % (ref 11.5–15.5)
WBC: 9.4 10*3/uL (ref 4.0–10.5)
nRBC: 0 % (ref 0.0–0.2)

## 2018-07-03 LAB — I-STAT BETA HCG BLOOD, ED (NOT ORDERABLE): I-stat hCG, quantitative: <5 m[IU]/mL (ref <5)

## 2018-07-03 LAB — CG4 I-STAT (LACTIC ACID): Lactic Acid, Venous: 1.12 mmol/L (ref 0.5–1.9)

## 2018-07-03 LAB — GROUP A STREP BY PCR: Group A Strep by PCR: NOT DETECTED

## 2018-07-03 MED ORDER — SODIUM CHLORIDE 0.9 % IV BOLUS: 1000.0000 mL | Freq: Once | INTRAVENOUS | Status: DC

## 2018-07-03 MED ORDER — ACETAMINOPHEN 325 MG PO TABS
650.0000 mg | ORAL_TABLET | Freq: Once | ORAL | Status: AC
  Administered 2018-07-03: 650 mg via ORAL
  Filled 2018-07-03: qty 2

## 2018-07-03 NOTE — Discharge Instructions (Addendum)
Your symptoms are most likely from a virus that has caused an upper respiratory infection. Your chest x-ray is negative. Your rapid strep test was negative. A viral illness typically peaks on day 2-3 and resolves after one week.    The main treatment approach for a viral upper respiratory infection is to treat the symptoms, support your immune system and prevent spread of illness.   Stay well-hydrated. Rest. You can use over the counter medications to help with symptoms: 600 mg ibuprofen (motrin, aleve, advil) or acetaminophen (tylenol) every 6 hours, around the clock to help with associated fevers, sore throat, headaches, generalized body aches and malaise.  Oxymetazoline (afrin) intranasal spray once daily for no more than 3 days to help with congestion, after 3 days you can switch to another over-the-counter nasal steroid spray such as fluticasone (flonase) Allergy medication (loratadine, cetirizine, etc) and phenylephrine (sudafed) help with nasal congestion, runny nose and postnasal drip.   Dextromethorphan (dayquil, tussin, mucinex) to help with chest congestion, cough and mucus.  Wash your hands often to prevent spread.   A viral upper respiratory infection can also worsen and progress into pneumonia.  Monitor your symptoms. If your symptoms worsen, persist and you develop persistent fevers, chest pain, productive cough you should follow up with your primary care provider.

## 2018-07-03 NOTE — ED Notes (Signed)
Ice water is at bedside.  Pt encouraged to drink

## 2018-07-03 NOTE — ED Triage Notes (Signed)
Patient here from home with complaints of chills, coughing, and fever that started yesterday.

## 2018-07-03 NOTE — ED Notes (Addendum)
Pt left without D/C paperwork.  Unable to obtain updated vitals.

## 2018-07-03 NOTE — ED Provider Notes (Signed)
[facility] DEPT Provider Note   CSN: [id number] Arrival date & time: 07/03/18  1343     History   Chief Complaint Chief Complaint  Patient presents with  . Chills  . Fever  . Cough    HPI Latoya [name] is a 27 y.o. female with h/o colitis not on any current medications here for evaluation of cough onset yesterday morning, sudden, persistent. Associated with phlegm production, left sided chest wall pain with coughing only, sore throat, body aches, headache, chills, subjective fevers.  Has taken alkazeltzer, tylenol PM, robitussin without relief.  Feels similar to strep in the past.  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  Did not receive flu shot this year.  Denies associated nausea, vomiting, diarrhea, abdominal pain, urinary changes.  No aggravating or alleviating factors.  HPI  Past Medical History:  Diagnosis Date  . Anemia   . Anxiety   . Arthritis   . Blood transfusion without reported diagnosis   . Crohn's disease ([place])   . Depression   . Drug-seeking behavior   . Gestational diabetes   . History of substance abuse ([place])   . HSV infection   . Pregnancy induced hypertension   . Substance abuse [facility])     Patient Active Problem List   Diagnosis Date Noted  . Post partum depression 07/12/2017  . IBD (inflammatory bowel disease)   . Acute pancreatitis   . Iron deficiency anemia due to chronic blood loss   . Heme positive stool   . Pancolitis ([place]) 07/04/2017  . Generalized abdominal pain   . Diarrhea   . Major depressive disorder, recurrent severe without psychotic features ([place]) 06/29/2017  . Cocaine abuse ([place]) 06/29/2017  . SVD (spontaneous vaginal delivery) 05/15/2017  . Crack cocaine use 05/15/2017  . Cervical dysplasia 01/16/2017  . Obesity (BMI 30.0-34.9) 01/12/2017  . History of substance abuse ([place]) 01/12/2017  . History of gestational diabetes mellitus (GDM) 01/12/2017  . History of gestational  hypertension 01/12/2017  . History of macrosomia in infant in prior pregnancy, currently pregnant 01/12/2017  . Short interval between pregnancies affecting pregnancy in second trimester, antepartum 01/12/2017  . Late prenatal care 01/12/2017  . Paxil use in early pregnancy 01/12/2017  . History of suicide attempt 01/12/2017  . Anxiety and depression 01/12/2017  . Supervision of high risk pregnancy, antepartum 01/10/2017  . GDM (gestational diabetes mellitus) 02/25/2016  . Obesity in pregnancy 02/25/2016    Past Surgical History:  Procedure Laterality Date  . CHOLECYSTECTOMY    . COLONOSCOPY N/A 07/07/2017   Procedure: COLONOSCOPY;  Surgeon: Juanita Craver, MD;  Location: [facility] ENDOSCOPY;  Service: Endoscopy;  Laterality: N/A;  . DEEP NECK LYMPH NODE BIOPSY / EXCISION  2011     OB History    Gravida  2   Para  2   Term  2   Preterm  0   AB  0   Living  2     SAB  0   TAB  0   Ectopic  0   Multiple  0   Live Births  2            Home Medications    Prior to Admission medications   Medication Sig Start Date End Date Taking? Authorizing Provider  acetaminophen (TYLENOL) 500 MG tablet Take 1,000 mg every 6 (six) hours as needed by mouth for mild pain.   Yes [provider]  Aspirin Effervescent (ALKA-SELTZER ORIGINAL PO) Take  2 tablets by mouth daily as needed (pain).   Yes [provider]  dextromethorphan-guaiFENesin (ROBITUSSIN COLD COUGH+ CHEST) 10-100 MG/5ML liquid Take 10 mLs by mouth every 4 (four) hours as needed for cough.   Yes [provider]  hyoscyamine (LEVSIN SL) 0.125 MG SL tablet Place 1 tablet (0.125 mg total) every 4 (four) hours as needed under the tongue. Patient not taking: Reported on 07/03/2018 08/03/17   Levin Erp, PA  magic mouthwash w/lidocaine SOLN Take 5 mLs by mouth 4 (four) times daily as needed for mouth pain. Patient not taking: Reported on 07/03/2018 11/07/17   Mauri Pole, MD  mesalamine  (APRISO) 0.375 g 24 hr capsule Take 4 capsules (1.5 g total) by mouth daily. Patient not taking: Reported on 07/03/2018 11/26/17   Mauri Pole, MD  omeprazole (PRILOSEC) 40 MG capsule TAKE 1 CAPSULE (40 MG TOTAL) DAILY BY MOUTH. Patient not taking: Reported on 07/03/2018 11/19/17   Mauri Pole, MD  PARoxetine (PAXIL-CR) 25 MG 24 hr tablet Take 1 tablet (25 mg total) by mouth daily. Patient not taking: Reported on 07/03/2018 05/16/17   Emily Filbert, MD    Family History Family History  Problem Relation Age of Onset  . Diabetes Mother   . Hypertension Mother   . Hypertension Father   . Stroke Maternal Grandfather   . Colon cancer Neg Hx   . Stomach cancer Neg Hx     Social History Social History   Tobacco Use  . Smoking status: Never Smoker  . Smokeless tobacco: Never Used  Substance Use Topics  . Alcohol use: No    Comment: "not for a while"  . Drug use: Yes    Types: "Crack" cocaine, Cocaine    Comment: cocai, former user, 06/27/2017     Allergies   Patient has no known allergies.   Review of Systems Review of Systems  Constitutional: Positive for chills and fever.  HENT: Positive for sore throat.   Respiratory: Positive for cough.   Cardiovascular: Positive for chest pain.  Musculoskeletal: Positive for myalgias.  Neurological: Positive for headaches.  All other systems reviewed and are negative.    Physical Exam Updated Vital Signs BP 119/86   Pulse (!) 102   Temp 100.2 F (37.9 C) (Oral)   Resp 15   Ht 5' 9"  (1.753 m)   Wt 86.2 kg   SpO2 100%   BMI 28.06 kg/m   Physical Exam  Constitutional: She is oriented to person, place, and time. She appears well-developed and well-nourished.  Non toxic  HENT:  Head: Normocephalic and atraumatic.  Nose: Nose normal.  Oropharynx and tonsils erythematous. Tonsils symmetrically hypertrophied with exudates. Uvula midline. No muffled voice. No SL edema or tenderness. Normal protrusion of the tongue.  Controlling oral secretions.  Moderate mucosal edema.   Eyes: Pupils are equal, round, and reactive to light. Conjunctivae and EOM are normal.  Neck: Normal range of motion.  Cardiovascular: Regular rhythm. Tachycardia present.  Tachycardic with low grade fever. No murmurs.   Pulmonary/Chest: Effort normal and breath sounds normal.  Abdominal: Soft. Bowel sounds are normal. There is no tenderness.  No G/R/R. No suprapubic or CVA tenderness. Negative Murphy's and McBurney's. Active BS to lower quadrants.   Musculoskeletal: Normal range of motion.  Neurological: She is alert and oriented to person, place, and time.  Skin: Skin is warm and dry. Capillary refill takes less than 2 seconds.  Psychiatric: She has a normal mood and affect.  Her behavior is normal.  Nursing note and vitals reviewed.    ED Treatments / Results  Labs (all labs ordered are listed, but only abnormal results are displayed) Labs Reviewed  COMPREHENSIVE METABOLIC PANEL - Abnormal; Notable for the following components:      Result Value   Potassium 3.3 (*)    Glucose, Bld 105 (*)    All other components within normal limits  CBC WITH DIFFERENTIAL/PLATELET - Abnormal; Notable for the following components:   Hemoglobin 11.6 (*)    Neutro Abs 8.0 (*)    Lymphs Abs 0.6 (*)    All other components within normal limits  URINALYSIS, ROUTINE W REFLEX MICROSCOPIC - Abnormal; Notable for the following components:   Hgb urine dipstick SMALL (*)    Leukocytes, UA TRACE (*)    All other components within normal limits  GROUP A STREP BY PCR  CULTURE, GROUP A STREP ([place])  I-STAT CG4 LACTIC ACID, ED  I-STAT BETA HCG BLOOD, ED ([facility], [facility], [facility] ONLY)  CG4 I-STAT (LACTIC ACID)  I-STAT BETA HCG BLOOD, ED (NOT ORDERABLE)  I-STAT CG4 LACTIC ACID, ED    EKG None  Radiology Dg Chest 2 View  Result Date: 07/03/2018 CLINICAL DATA:  Cough, congestion and fever EXAM: CHEST - 2 VIEW COMPARISON:  01/26/2018 FINDINGS: The heart size  and mediastinal contours are within normal limits. Both lungs are clear. The visualized skeletal structures are unremarkable. IMPRESSION: No active cardiopulmonary disease. Electronically Signed   By: Ashley Royalty M.D.   On: 07/03/2018 14:42    Procedures Procedures (including critical care time)  Medications Ordered in ED Medications  sodium chloride 0.9 % bolus 1,000 mL (has no administration in time range)  acetaminophen (TYLENOL) tablet 650 mg (650 mg Oral Given 07/03/18 1522)     Initial Impression / Assessment and Plan / ED Course  I have reviewed the triage vital signs and the nursing notes.  Pertinent labs & imaging results that were available during my care of the patient were reviewed by me and considered in my medical decision making (see chart for details).    This is most likely viral syndrome vs strep pharyngitis vs PNA.  She has no urinary or GI symptoms.  Was tachycardic initially however likely from mounting fever.  She has left sided chest pain with coughing only, favoring MSK vs pleurisy.  No cardiac murmurs, no h/o IVDU, no positional changes to left sided chest pain. Pericarditis, cardiac ischemia, endocarditis considered very unlikely in this patient. Will obtain screening labs, CXR, UA and reassess. PO fluids.   1845: Labs ordered at triage reviewed and unremarkable. UA clean. Lactic acid normal. HR 140> 102 with oral fluids and pain/fever control.  CXR negative. Strep negative. She is tolerating PO.  Discussed results with patient.  Will dc with symptomatic management. Return precautions given.   Final Clinical Impressions(s) / ED Diagnoses   Final diagnoses:  Productive cough  Viral syndrome  Influenza-like illness    ED Discharge Orders    None       Kinnie Feil, PA-C 07/03/18 1847    Valarie Merino, MD 07/04/18 [phone]

## 2018-07-06 LAB — CULTURE, GROUP A STREP (THRC)

## 2018-11-19 ENCOUNTER — Emergency Department (HOSPITAL_COMMUNITY)
Admission: EM | Admit: 2018-11-19 | Discharge: 2018-11-19 | Disposition: A | Discharge status: Home / Self Care | Payer: Medicaid Other | Attending: Emergency Medicine | Admitting: Emergency Medicine

## 2018-11-19 ENCOUNTER — Encounter (HOSPITAL_COMMUNITY): Payer: Self-pay | Admitting: Emergency Medicine

## 2018-11-19 DIAGNOSIS — K5090 Crohn's disease, unspecified, without complications: Secondary | ICD-10-CM | POA: Diagnosis not present

## 2018-11-19 DIAGNOSIS — J020 Streptococcal pharyngitis: Secondary | ICD-10-CM | POA: Insufficient documentation

## 2018-11-19 DIAGNOSIS — J029 Acute pharyngitis, unspecified: Secondary | ICD-10-CM | POA: Diagnosis present

## 2018-11-19 DIAGNOSIS — E860 Dehydration: Secondary | ICD-10-CM | POA: Insufficient documentation

## 2018-11-19 LAB — GROUP A STREP BY PCR: Group A Strep by PCR: DETECTED — AB

## 2018-11-19 MED ORDER — DEXAMETHASONE SODIUM PHOSPHATE 10 MG/ML IJ SOLN: 10.0000 mg | Freq: Once | INTRAMUSCULAR | Status: DC

## 2018-11-19 MED ORDER — PENICILLIN G BENZATHINE 1200000 UNIT/2ML IM SUSP
1.2000 10*6.[IU] | Freq: Once | INTRAMUSCULAR | Status: AC
  Administered 2018-11-19: 1.2 10*6.[IU] via INTRAMUSCULAR
  Filled 2018-11-19: qty 2

## 2018-11-19 MED ORDER — DEXAMETHASONE SODIUM PHOSPHATE 10 MG/ML IJ SOLN
10.0000 mg | Freq: Once | INTRAMUSCULAR | Status: AC
  Administered 2018-11-19: 10 mg via INTRAMUSCULAR
  Filled 2018-11-19: qty 1

## 2018-11-19 MED ORDER — LIDOCAINE VISCOUS HCL 2 % MT SOLN
15.0000 mL | Freq: Once | OROMUCOSAL | Status: AC
  Administered 2018-11-19: 15 mL via OROMUCOSAL
  Filled 2018-11-19: qty 15

## 2018-11-19 MED ORDER — ACETAMINOPHEN 325 MG PO TABS
650.0000 mg | ORAL_TABLET | Freq: Once | ORAL | Status: AC | PRN
  Administered 2018-11-19: 650 mg via ORAL
  Filled 2018-11-19: qty 2

## 2018-11-19 MED ORDER — ONDANSETRON 4 MG PO TBDP
4.0000 mg | ORAL_TABLET | Freq: Once | ORAL | Status: AC
  Administered 2018-11-19: 4 mg via ORAL
  Filled 2018-11-19: qty 1

## 2018-11-19 MED ORDER — LIDOCAINE VISCOUS HCL 2 % MT SOLN: OROMUCOSAL | 0 refills | Status: DC

## 2018-11-19 MED ORDER — IBUPROFEN 200 MG PO TABS
600.0000 mg | ORAL_TABLET | Freq: Once | ORAL | Status: AC
  Administered 2018-11-19: 600 mg via ORAL
  Filled 2018-11-19: qty 3

## 2018-11-19 NOTE — ED Provider Notes (Signed)
[facility] DEPT Provider Note   CSN: [id number] Arrival date & time: 11/19/18  1143    History   Chief Complaint Chief Complaint  Patient presents with  . Sore Throat    HPI Latoya [name] is a 28 y.o. female who presents to the ED with sore throat and fever. The symptoms started 2 days ago and have gotten worse. No cough or congestion.      The history is provided by the patient. No language interpreter was used.  Sore Throat  This is a new problem. The current episode started 2 days ago. The problem occurs constantly. The problem has been gradually worsening. Associated symptoms include headaches. Pertinent negatives include no chest pain, no abdominal pain and no shortness of breath. The symptoms are aggravated by swallowing and eating. Nothing relieves the symptoms. She has tried acetaminophen for the symptoms. The treatment provided no relief.    Past Medical History:  Diagnosis Date  . Anemia   . Anxiety   . Arthritis   . Blood transfusion without reported diagnosis   . Crohn's disease ([place])   . Depression   . Drug-seeking behavior   . Gestational diabetes   . History of substance abuse ([place])   . HSV infection   . Pregnancy induced hypertension   . Substance abuse [facility])     Patient Active Problem List   Diagnosis Date Noted  . Post partum depression 07/12/2017  . IBD (inflammatory bowel disease)   . Acute pancreatitis   . Iron deficiency anemia due to chronic blood loss   . Heme positive stool   . Pancolitis ([place]) 07/04/2017  . Generalized abdominal pain   . Diarrhea   . Major depressive disorder, recurrent severe without psychotic features ([place]) 06/29/2017  . Cocaine abuse ([place]) 06/29/2017  . SVD (spontaneous vaginal delivery) 05/15/2017  . Crack cocaine use 05/15/2017  . Cervical dysplasia 01/16/2017  . Obesity (BMI 30.0-34.9) 01/12/2017  . History of substance abuse ([place]) 01/12/2017  . History of gestational diabetes  mellitus (GDM) 01/12/2017  . History of gestational hypertension 01/12/2017  . History of macrosomia in infant in prior pregnancy, currently pregnant 01/12/2017  . Short interval between pregnancies affecting pregnancy in second trimester, antepartum 01/12/2017  . Late prenatal care 01/12/2017  . Paxil use in early pregnancy 01/12/2017  . History of suicide attempt 01/12/2017  . Anxiety and depression 01/12/2017  . Supervision of high risk pregnancy, antepartum 01/10/2017  . GDM (gestational diabetes mellitus) 02/25/2016  . Obesity in pregnancy 02/25/2016    Past Surgical History:  Procedure Laterality Date  . CHOLECYSTECTOMY    . COLONOSCOPY N/A 07/07/2017   Procedure: COLONOSCOPY;  Surgeon: Juanita Craver, MD;  Location: [facility] ENDOSCOPY;  Service: Endoscopy;  Laterality: N/A;  . DEEP NECK LYMPH NODE BIOPSY / EXCISION  2011     OB History    Gravida  2   Para  2   Term  2   Preterm  0   AB  0   Living  2     SAB  0   TAB  0   Ectopic  0   Multiple  0   Live Births  2            Home Medications    Prior to Admission medications   Medication Sig Start Date End Date Taking? Authorizing Provider  acetaminophen (TYLENOL) 500 MG tablet Take 1,000 mg every 6 (six) hours as needed by mouth for mild  pain.    [provider]  Aspirin Effervescent (ALKA-SELTZER ORIGINAL PO) Take 2 tablets by mouth daily as needed (pain).    [provider]  dextromethorphan-guaiFENesin (ROBITUSSIN COLD COUGH+ CHEST) 10-100 MG/5ML liquid Take 10 mLs by mouth every 4 (four) hours as needed for cough.    [provider]  hyoscyamine (LEVSIN SL) 0.125 MG SL tablet Place 1 tablet (0.125 mg total) every 4 (four) hours as needed under the tongue. Patient not taking: Reported on 07/03/2018 08/03/17   Levin Erp, PA  lidocaine (XYLOCAINE) 2 % solution Apply 5 ml to the affected areas every 3 hours as needed for pain. You may spit out the excess. 11/19/18    Ashley Murrain, NP  mesalamine (APRISO) 0.375 g 24 hr capsule Take 4 capsules (1.5 g total) by mouth daily. Patient not taking: Reported on 07/03/2018 11/26/17   Mauri Pole, MD  omeprazole (PRILOSEC) 40 MG capsule TAKE 1 CAPSULE (40 MG TOTAL) DAILY BY MOUTH. Patient not taking: Reported on 07/03/2018 11/19/17   Mauri Pole, MD  PARoxetine (PAXIL-CR) 25 MG 24 hr tablet Take 1 tablet (25 mg total) by mouth daily. Patient not taking: Reported on 07/03/2018 05/16/17   Emily Filbert, MD    Family History Family History  Problem Relation Age of Onset  . Diabetes Mother   . Hypertension Mother   . Hypertension Father   . Stroke Maternal Grandfather   . Colon cancer Neg Hx   . Stomach cancer Neg Hx     Social History Social History   Tobacco Use  . Smoking status: Never Smoker  . Smokeless tobacco: Never Used  Substance Use Topics  . Alcohol use: No    Comment: "not for a while"  . Drug use: Yes    Types: "Crack" cocaine, Cocaine    Comment: cocai, former user, 06/27/2017     Allergies   Patient has no known allergies.   Review of Systems Review of Systems  Constitutional: Positive for chills and fever.  HENT: Positive for ear pain and sore throat. Negative for voice change. Trouble swallowing: pain.   Eyes: Negative for discharge and redness.  Respiratory: Negative for cough and shortness of breath.   Cardiovascular: Negative for chest pain.  Gastrointestinal: Negative for abdominal pain and nausea.  Genitourinary: Negative for dysuria and frequency.  Musculoskeletal: Positive for myalgias. Negative for back pain.  Skin: Negative for rash.  Neurological: Positive for headaches. Negative for syncope.  Psychiatric/Behavioral: Negative for confusion.     Physical Exam Updated Vital Signs BP 124/72   Pulse (!) 120   Temp (!) 100.9 F (38.3 C) (Oral)   Resp 17   Ht 5' 9"  (1.753 m)   Wt 90.7 kg   LMP 11/19/2018   SpO2 99%   BMI 29.53 kg/m   Physical  Exam Vitals signs and nursing note reviewed.  Constitutional:      General: She is not in acute distress.    Appearance: She is well-developed.  HENT:     Head: Normocephalic and atraumatic.     Right Ear: Tympanic membrane normal.     Left Ear: Tympanic membrane normal.     Nose: Nose normal.     Mouth/Throat:     Mouth: Mucous membranes are moist.     Pharynx: Uvula midline. Posterior oropharyngeal erythema present.  Eyes:     Extraocular Movements: Extraocular movements intact.     Conjunctiva/sclera: Conjunctivae normal.  Neck:  Musculoskeletal: Neck supple.  Cardiovascular:     Rate and Rhythm: Regular rhythm. Tachycardia present.  Pulmonary:     Effort: Pulmonary effort is normal.     Breath sounds: Normal breath sounds.  Musculoskeletal: Normal range of motion.  Lymphadenopathy:     Cervical: Cervical adenopathy present.  Skin:    General: Skin is warm and dry.  Neurological:     Mental Status: She is alert and oriented to person, place, and time.  Psychiatric:        Mood and Affect: Mood normal.      ED Treatments / Results  Labs (all labs ordered are listed, but only abnormal results are displayed) Labs Reviewed  GROUP A STREP BY PCR - Abnormal; Notable for the following components:      Result Value   Group A Strep by PCR DETECTED (*)    All other components within normal limits   Radiology No results found.  Procedures Procedures (including critical care time)  Medications Ordered in ED Medications  acetaminophen (TYLENOL) tablet 650 mg (650 mg Oral Given 11/19/18 1208)  penicillin g benzathine (BICILLIN LA) 1200000 UNIT/2ML injection 1.2 Million Units (1.2 Million Units Intramuscular Given 11/19/18 1351)  lidocaine (XYLOCAINE) 2 % viscous mouth solution 15 mL (15 mLs Mouth/Throat Given 11/19/18 1348)  ondansetron (ZOFRAN-ODT) disintegrating tablet 4 mg (4 mg Oral Given 11/19/18 1349)  ibuprofen (ADVIL,MOTRIN) tablet 600 mg (600 mg Oral Given  11/19/18 1347)  dexamethasone (DECADRON) injection 10 mg (10 mg Intramuscular Given 11/19/18 1350)     Initial Impression / Assessment and Plan / ED Course  I have reviewed the triage vital signs and the nursing notes. Pt febrile with erythema of throat, cervical lymphadenopathy, & dysphagia; diagnosis of bacterial pharyngitis.strep screen positive. Treated in the ED with steroids, NSAIDs and PCN IM.  Pt appears mildly dehydrated, discussed importance of water rehydration. Presentation non concerning for PTA or RPA. No trismus or uvula deviation. Specific return precautions discussed. Pt able to drink water in ED without difficulty with intact air way. Recommended PCP follow up.   Final Clinical Impressions(s) / ED Diagnoses   Final diagnoses:  Strep throat    ED Discharge Orders         Ordered    lidocaine (XYLOCAINE) 2 % solution     11/19/18 1346           Janit Bern [place], [place] 11/19/18 2151    Hayden Rasmussen, MD 11/20/18 [id number]

## 2018-11-19 NOTE — ED Triage Notes (Signed)
Per pt, states sore throat for 2 days-states painful to swallow

## 2018-11-19 NOTE — Discharge Instructions (Addendum)
Use salt water gargles for pain, take liquid tylenol and ibuprofen as needed for fever and pain. Follow up with your doctor or return here for worsening symptoms. Be sure you are drinking plenty of fluids so you do not get dehydrated.

## 2019-04-07 IMAGING — US US MFM OB DETAIL+14 WK
1 series · 14 of 28 positions shown · non-contrast
Comparison: none

[Series 1: us mfm ob detail+14 wk · 87 acquisitions, 14 frames shown]
[im 4/87]
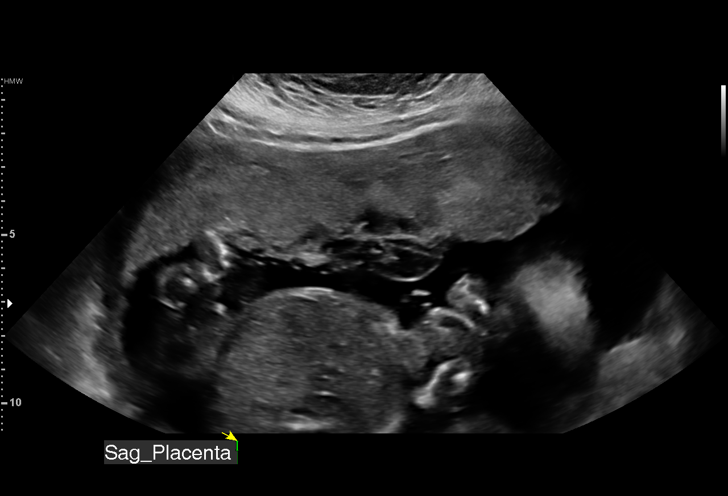
[im 10/87]
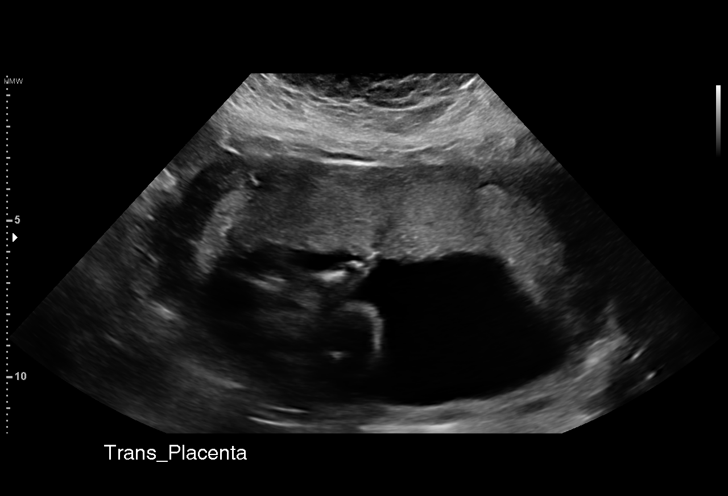
[im 16/87]
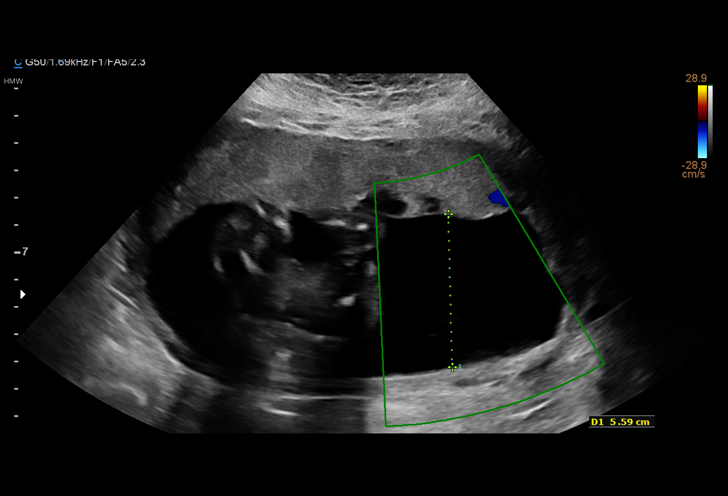
[im 23/87]
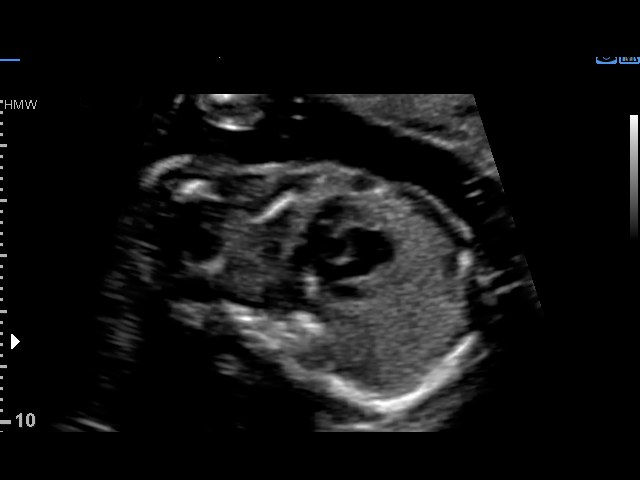
[im 29/87]
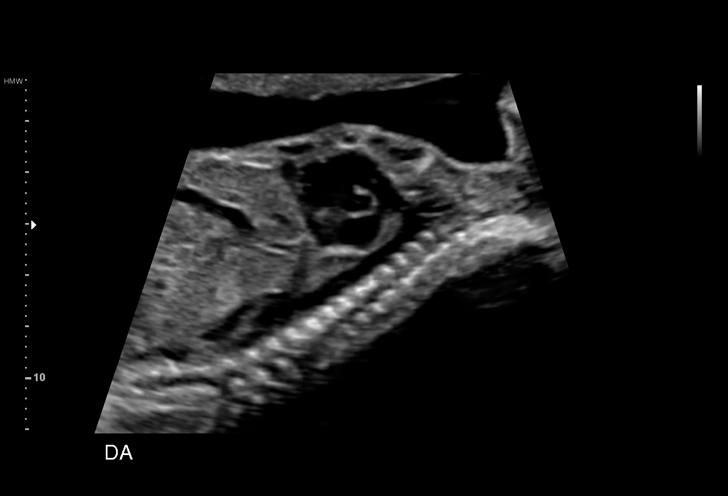
[im 36/87]
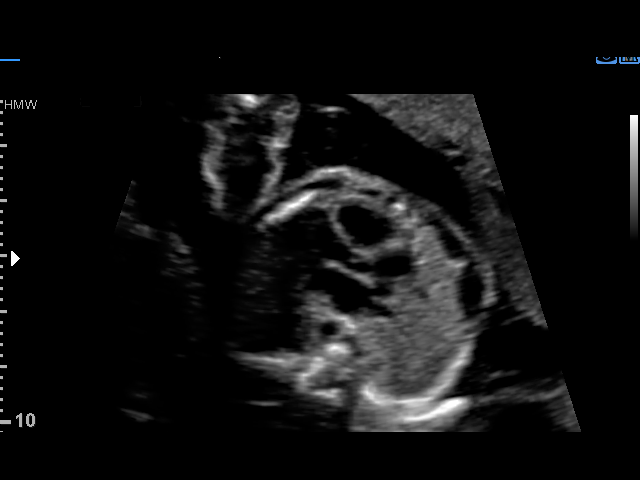
[im 42/87]
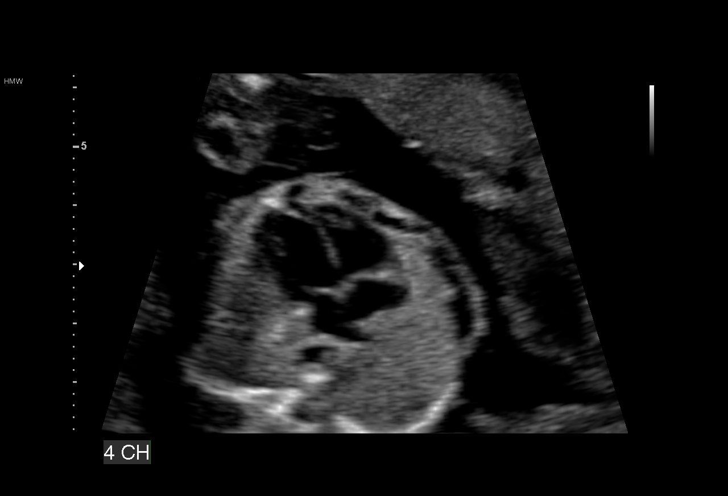
[im 48/87]
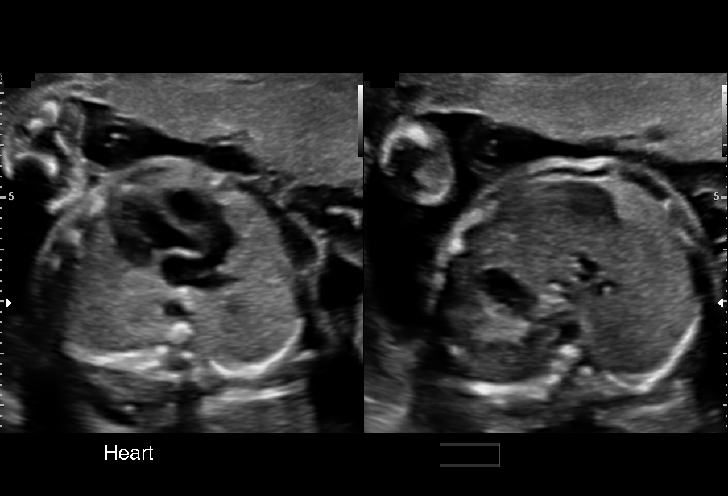
[im 55/87]
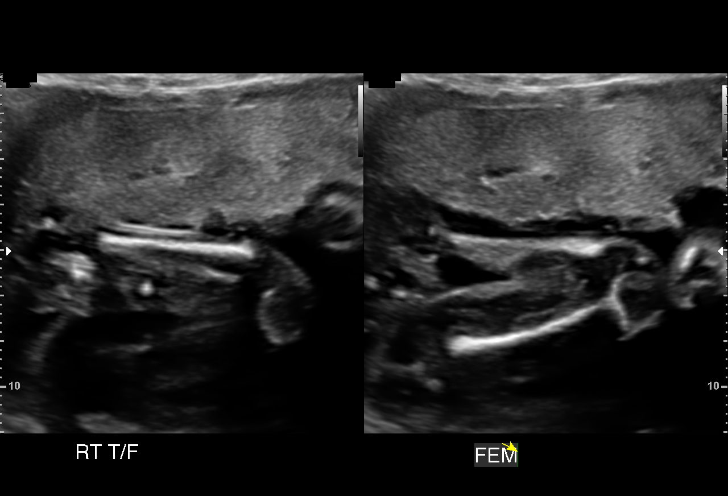
[im 61/87]
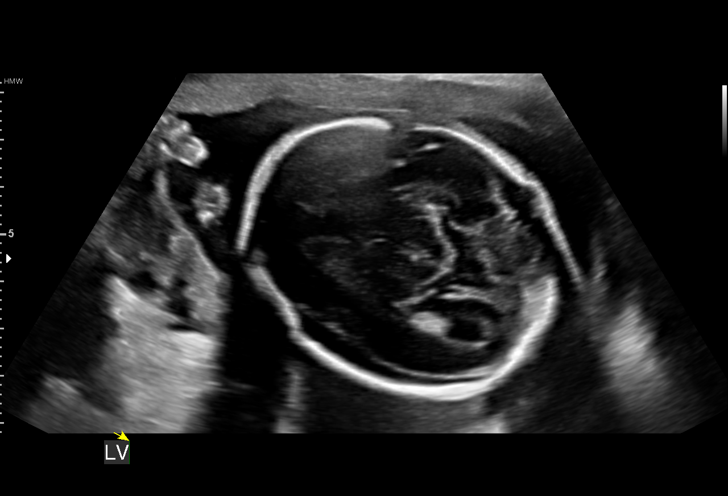
[im 67/87]
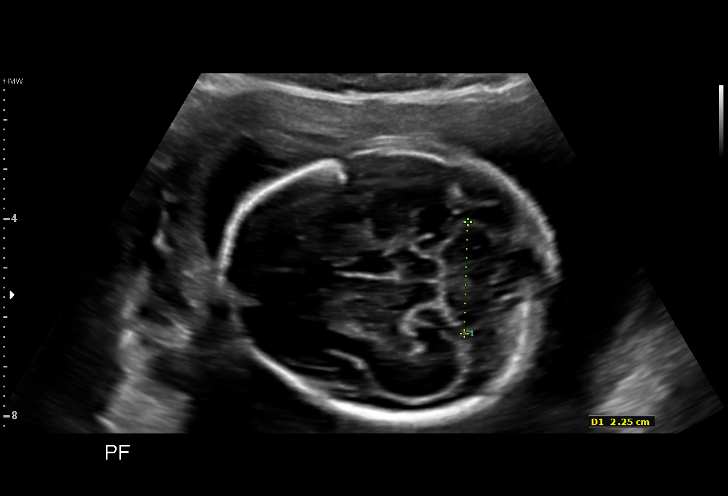
[im 74/87]
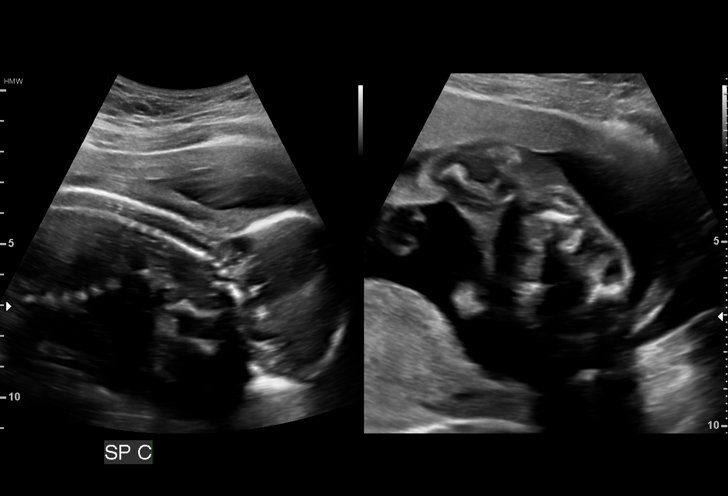
[im 80/87]
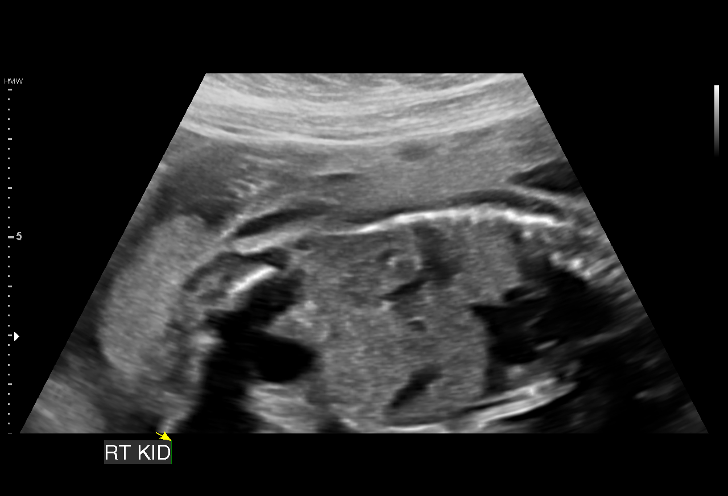
[im 87/87]
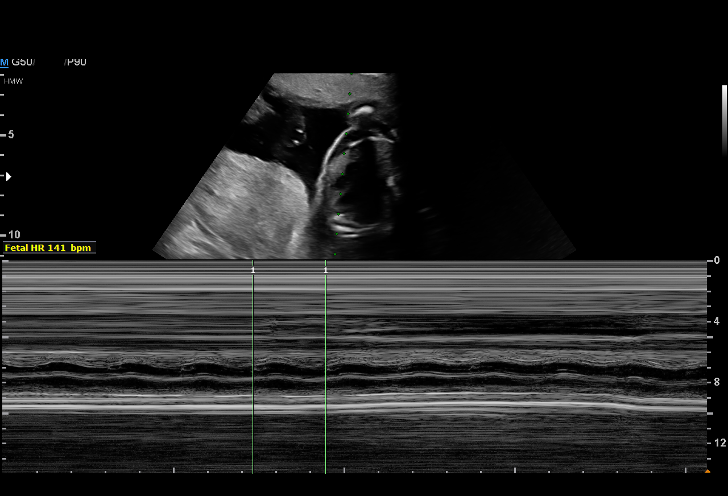

[14 of 28 positions shown; findings below may reference images not displayed]

1  SHAIB BOKA          [id number]      [id number]     [id number]
Indications

22 weeks gestation of pregnancy
Poor obstetric history: Previous gestational
diabetes, fetal macrosomia, preeclampsia
Substance abuse affecting pregnancy,
antepartum (sober one month)
Obesity complicating pregnancy, second
trimester
Advanced Paternal Age - Encounter for
procreative genetic counseling
Medication exposure during first trimester of
pregnancy (Paxil)
Short interval between pregancies, 2nd
trimester
Encounter for antenatal screening for
malformations
OB History

Gravidity:    2         Term:   1
Living:       1
Fetal Evaluation

Num Of Fetuses:     1
Fetal Heart         141
Rate(bpm):
Cardiac Activity:   Observed
Presentation:       Cephalic
Placenta:           Anterior, above cervical os
P. Cord Insertion:  Visualized, central
Amniotic Fluid
AFI FV:      Subjectively within normal limits

Largest Pocket(cm)
5.59
Biometry

BPD:      54.2  mm     G. Age:  22w 3d         37  %    CI:        76.31   %   70 - 86
FL/HC:      18.1   %   19.2 -
HC:      196.6  mm     G. Age:  21w 6d         10  %    HC/AC:      1.17       1.05 -
AC:      167.9  mm     G. Age:  21w 6d         17  %    FL/BPD:     65.5   %   71 - 87
FL:       35.5  mm     G. Age:  21w 1d          6  %    FL/AC:      21.1   %   20 - 24
HUM:      34.8  mm     G. Age:  21w 6d         28  %

Est. FW:     437  gm    0 lb 15 oz      24  %
Gestational Age

LMP:           25w 4d       Date:   07/28/16                 EDD:   05/04/17
U/S Today:     21w 6d                                        EDD:   05/30/17
Best:          22w 5d    Det. By:   Early Ultrasound         EDD:   05/24/17
(10/10/16)
Anatomy

Cranium:               Appears normal         Aortic Arch:            Appears normal
Cavum:                 Appears normal         Ductal Arch:            Appears normal
Ventricles:            Appears normal         Diaphragm:              Appears normal
Choroid Plexus:        Appears normal         Stomach:                Appears normal, left
sided
Cerebellum:            Appears normal         Abdomen:                Appears normal
Posterior Fossa:       Appears normal         Abdominal Wall:         Not well visualized
Nuchal Fold:           Not applicable (>20    Cord Vessels:           Appears normal (3
wks GA)                                        vessel cord)
Face:                  Orbits nl; profile not Kidneys:                Appear normal
well visualized
Lips:                  Not well visualized    Bladder:                Appears normal
Thoracic:              Appears normal         Spine:                  Appears normal
Heart:                 Appears normal         Upper Extremities:      Appears normal
(4CH, axis, and situs
RVOT:                  Appears normal         Lower Extremities:      Appears normal
LVOT:                  Appears normal

Other:  Fetus appears to be a female. Heels visualized. Technically difficult
due to maternal habitus and fetal position.
Cervix Uterus Adnexa

Cervix
Length:            4.3  cm.
Normal appearance by transabdominal scan.

Uterus
No abnormality visualized.

Left Ovary
No adnexal mass visualized.
Right Ovary
No adnexal mass visualized.

Cul De Sac:   No free fluid seen.

Adnexa:       No abnormality visualized.
Impression

SIUP at 22+5 weeks
Normal detailed fetal anatomy; limited views of face and CI
Markers of aneuploidy: none
Normal amniotic fluid volume
Measurements consistent with early US
Recommendations

Follow-up ultrasound in 4 weeks to complete anatomy survey

## 2019-06-30 ENCOUNTER — Emergency Department (HOSPITAL_COMMUNITY): Payer: Medicaid Other

## 2019-06-30 ENCOUNTER — Other Ambulatory Visit: Payer: Self-pay

## 2019-06-30 ENCOUNTER — Emergency Department (HOSPITAL_COMMUNITY)
Admission: EM | Admit: 2019-06-30 | Discharge: 2019-06-30 | Disposition: A | Discharge status: Home / Self Care | Payer: Medicaid Other | Attending: Emergency Medicine | Admitting: Emergency Medicine

## 2019-06-30 DIAGNOSIS — F1410 Cocaine abuse, uncomplicated: Secondary | ICD-10-CM | POA: Diagnosis not present

## 2019-06-30 DIAGNOSIS — R55 Syncope and collapse: Secondary | ICD-10-CM | POA: Diagnosis present

## 2019-06-30 LAB — CBC WITH DIFFERENTIAL/PLATELET
Abs Immature Granulocytes: 0.04 10*3/uL (ref 0.00–0.07)
Basophils Absolute: 0 10*3/uL (ref 0.0–0.1)
Basophils Relative: 0 %
Eosinophils Absolute: 0.1 10*3/uL (ref 0.0–0.5)
Eosinophils Relative: 1 %
HCT: 38.5 % (ref 36.0–46.0)
Hemoglobin: 12 g/dL (ref 12.0–15.0)
Immature Granulocytes: 1 %
Lymphocytes Relative: 22 %
Lymphs Abs: 1.8 10*3/uL (ref 0.7–4.0)
MCH: 27.8 pg (ref 26.0–34.0)
MCHC: 31.2 g/dL (ref 30.0–36.0)
MCV: 89.3 fL (ref 80.0–100.0)
Monocytes Absolute: 0.3 10*3/uL (ref 0.1–1.0)
Monocytes Relative: 4 %
Neutro Abs: 5.9 10*3/uL (ref 1.7–7.7)
Neutrophils Relative %: 72 %
Platelets: 261 10*3/uL (ref 150–400)
RBC: 4.31 MIL/uL (ref 3.87–5.11)
RDW: 12.1 % (ref 11.5–15.5)
WBC: 8.2 10*3/uL (ref 4.0–10.5)
nRBC: 0 % (ref 0.0–0.2)

## 2019-06-30 LAB — BASIC METABOLIC PANEL
Anion gap: 4 — ABNORMAL LOW (ref 5–15)
BUN: 12 mg/dL (ref 6–20)
CO2: 22 mmol/L (ref 22–32)
Calcium: 8 mg/dL — ABNORMAL LOW (ref 8.9–10.3)
Chloride: 113 mmol/L — ABNORMAL HIGH (ref 98–111)
Creatinine, Ser: 0.65 mg/dL (ref 0.44–1.00)
GFR calc Af Amer: >60 mL/min (ref >60)
GFR calc non Af Amer: >60 mL/min (ref >60)
Glucose, Bld: 103 mg/dL — ABNORMAL HIGH (ref 70–99)
Potassium: 3.6 mmol/L (ref 3.5–5.1)
Sodium: 139 mmol/L (ref 135–145)

## 2019-06-30 LAB — URINALYSIS, ROUTINE W REFLEX MICROSCOPIC
Bilirubin Urine: NEGATIVE
Glucose, UA: NEGATIVE mg/dL
Hgb urine dipstick: NEGATIVE
Ketones, ur: NEGATIVE mg/dL
Nitrite: NEGATIVE
Protein, ur: NEGATIVE mg/dL
Specific Gravity, Urine: 1.008 (ref 1.005–1.030)
pH: 7 (ref 5.0–8.0)

## 2019-06-30 LAB — I-STAT BETA HCG BLOOD, ED (MC, WL, AP ONLY): I-stat hCG, quantitative: <5 m[IU]/mL (ref <5)

## 2019-06-30 LAB — SAMPLE TO BLOOD BANK

## 2019-06-30 NOTE — ED Notes (Signed)
Discharged instructions reviewed. Patient verbalized understanding. IV removed.

## 2019-06-30 NOTE — Discharge Instructions (Signed)
Make sure to drink adequate fluids. Return for any new or worsening symptoms

## 2019-06-30 NOTE — ED Triage Notes (Signed)
[facility]-  Per EMS- pt was donating plasma when she had a witnessed syncopal episode lasting appx 2 mins. Pt did hit her head on the floor, denies neck or back pain.    No injuries noted.  Pt AOx4 on arrival to ED.    VS 102/62 80 HR CBG 134 600 mL NS PTA- 20 g R forearm

## 2019-06-30 NOTE — ED Provider Notes (Signed)
[facility] DEPT Provider Note   CSN: [id number] Arrival date & time: 06/30/19  1015   History   Chief Complaint Chief Complaint  Patient presents with  . Loss of Consciousness   HPI Latoya [name] is a 28 y.o. female with medical history significant for substance abuse, drug-seeking behavior, anemia, depression who presents for evaluation of syncopal event.  Patient states she was donating plasma at the plasma center when she went to stand up because she had to use the bathroom "really bad" when she had a syncopal event.  She admits to hitting her posterior head with the event however does not have any headache, blurred vision.  Patient had no preceding aura.  She has no prior history of syncopal events.  No bowel or bladder incontinence, saddle paresthesia.  She denies any pain, vision changes, chest pain, shortness of breath, cough, abdominal pain, diarrhea, dysuria.  Patient states she did eat a heavy bun for breakfast this morning.  Patient admits to polysubstance abuse however has not used in "a long time."  Patient CBG 134 with EMS.  Patient given 600 mL normal saline with EMS.  Denies any additional aggravating or alleviating factors.  History obtained from patient, past medical records and EMS note.  No interpreter was used.     HPI  Past Medical History:  Diagnosis Date  . Anemia   . Anxiety   . Arthritis   . Blood transfusion without reported diagnosis   . Crohn's disease ([place])   . Depression   . Drug-seeking behavior   . Gestational diabetes   . History of substance abuse ([place])   . HSV infection   . Pregnancy induced hypertension   . Substance abuse [facility])     Patient Active Problem List   Diagnosis Date Noted  . Post partum depression 07/12/2017  . IBD (inflammatory bowel disease)   . Acute pancreatitis   . Iron deficiency anemia due to chronic blood loss   . Heme positive stool   . Pancolitis ([place]) 07/04/2017  .  Generalized abdominal pain   . Diarrhea   . Major depressive disorder, recurrent severe without psychotic features ([place]) 06/29/2017  . Cocaine abuse ([place]) 06/29/2017  . SVD (spontaneous vaginal delivery) 05/15/2017  . Crack cocaine use 05/15/2017  . Cervical dysplasia 01/16/2017  . Obesity (BMI 30.0-34.9) 01/12/2017  . History of substance abuse ([place]) 01/12/2017  . History of gestational diabetes mellitus (GDM) 01/12/2017  . History of gestational hypertension 01/12/2017  . History of macrosomia in infant in prior pregnancy, currently pregnant 01/12/2017  . Short interval between pregnancies affecting pregnancy in second trimester, antepartum 01/12/2017  . Late prenatal care 01/12/2017  . Paxil use in early pregnancy 01/12/2017  . History of suicide attempt 01/12/2017  . Anxiety and depression 01/12/2017  . Supervision of high risk pregnancy, antepartum 01/10/2017  . GDM (gestational diabetes mellitus) 02/25/2016  . Obesity in pregnancy 02/25/2016    Past Surgical History:  Procedure Laterality Date  . CHOLECYSTECTOMY    . COLONOSCOPY N/A 07/07/2017   Procedure: COLONOSCOPY;  Surgeon: Juanita Craver, MD;  Location: [facility] ENDOSCOPY;  Service: Endoscopy;  Laterality: N/A;  . DEEP NECK LYMPH NODE BIOPSY / EXCISION  2011     OB History    Gravida  2   Para  2   Term  2   Preterm  0   AB  0   Living  2     SAB  0   TAB  0   Ectopic  0   Multiple  0   Live Births  2            Home Medications    Prior to Admission medications   Medication Sig Start Date End Date Taking? Authorizing Provider  hyoscyamine (LEVSIN SL) 0.125 MG SL tablet Place 1 tablet (0.125 mg total) every 4 (four) hours as needed under the tongue. Patient not taking: Reported on 07/03/2018 08/03/17   Levin Erp, PA  lidocaine (XYLOCAINE) 2 % solution Apply 5 ml to the affected areas every 3 hours as needed for pain. You may spit out the excess. Patient not taking: Reported on  06/30/2019 11/19/18   Ashley Murrain, NP  mesalamine (APRISO) 0.375 g 24 hr capsule Take 4 capsules (1.5 g total) by mouth daily. Patient not taking: Reported on 07/03/2018 11/26/17   Mauri Pole, MD  omeprazole (PRILOSEC) 40 MG capsule TAKE 1 CAPSULE (40 MG TOTAL) DAILY BY MOUTH. Patient not taking: Reported on 07/03/2018 11/19/17   Mauri Pole, MD  PARoxetine (PAXIL-CR) 25 MG 24 hr tablet Take 1 tablet (25 mg total) by mouth daily. Patient not taking: Reported on 07/03/2018 05/16/17   Emily Filbert, MD    Family History Family History  Problem Relation Age of Onset  . Diabetes Mother   . Hypertension Mother   . Hypertension Father   . Stroke Maternal Grandfather   . Colon cancer Neg Hx   . Stomach cancer Neg Hx     Social History Social History   Tobacco Use  . Smoking status: Never Smoker  . Smokeless tobacco: Never Used  Substance Use Topics  . Alcohol use: No    Comment: "not for a while"  . Drug use: Yes    Types: "Crack" cocaine, Cocaine    Comment: cocai, former user, 06/27/2017     Allergies   Patient has no known allergies.   Review of Systems Review of Systems  Constitutional: Negative.   HENT: Negative.   Eyes: Negative.   Respiratory: Negative.   Cardiovascular: Negative.   Gastrointestinal: Negative.   Genitourinary: Negative.   Musculoskeletal: Negative.   Skin: Negative.   Neurological: Positive for syncope. Negative for tremors, seizures, facial asymmetry, speech difficulty, weakness, light-headedness, numbness and headaches.  All other systems reviewed and are negative.  Physical Exam Updated Vital Signs BP 117/78   Pulse 89   Temp 98.7 F (37.1 C) (Oral)   Resp 20   LMP 06/04/2019   SpO2 100%   Physical Exam  Physical Exam  Constitutional: Pt is oriented to person, place, and time. Pt appears well-developed and well-nourished. No distress.  HENT:  Head: Normocephalic and atraumatic.  Mouth/Throat: Oropharynx is clear and  moist.  Eyes: Conjunctivae and EOM are normal. Pupils are equal, round, and reactive to light. No scleral icterus.  No horizontal, vertical or rotational nystagmus  Neck: Normal range of motion. Neck supple.  Full active and passive ROM without pain No midline or paraspinal tenderness No nuchal rigidity or meningeal signs  Cardiovascular: Normal rate, regular rhythm and intact distal pulses.   Pulmonary/Chest: Effort normal and breath sounds normal. No respiratory distress. Pt has no wheezes. No rales.  Abdominal: Soft. Bowel sounds are normal. There is no tenderness. There is no rebound and no guarding.  Musculoskeletal: Normal range of motion.  Lymphadenopathy:    No cervical adenopathy.  Neurological: Pt. is alert and oriented to person, place, and time. He has normal  reflexes. No cranial nerve deficit.  Exhibits normal muscle tone. Coordination normal.  Mental Status:  Alert, oriented, thought content appropriate. Speech fluent without evidence of aphasia. Able to follow 2 step commands without difficulty.  Cranial Nerves:  II:  Peripheral visual fields grossly normal, pupils equal, round, reactive to light III,IV, VI: ptosis not present, extra-ocular motions intact bilaterally  V,VII: smile symmetric, facial light touch sensation equal VIII: hearing grossly normal bilaterally  IX,X: midline uvula rise  XI: bilateral shoulder shrug equal and strong XII: midline tongue extension  Motor:  5/5 in upper and lower extremities bilaterally including strong and equal grip strength and dorsiflexion/plantar flexion Sensory: Pinprick and light touch normal in all extremities.  Deep Tendon Reflexes: 2+ and symmetric  Cerebellar: normal finger-to-nose with bilateral upper extremities Gait: normal gait and balance CV: distal pulses palpable throughout   Skin: Skin is warm and dry. No rash noted. Pt is not diaphoretic.  Psychiatric: Pt has a normal mood and affect. Behavior is normal. Judgment  and thought content normal.  Nursing note and vitals reviewed. ED Treatments / Results  Labs (all labs ordered are listed, but only abnormal results are displayed) Labs Reviewed  BASIC METABOLIC PANEL - Abnormal; Notable for the following components:      Result Value   Chloride 113 (*)    Glucose, Bld 103 (*)    Calcium 8.0 (*)    Anion gap 4 (*)    All other components within normal limits  URINALYSIS, ROUTINE W REFLEX MICROSCOPIC - Abnormal; Notable for the following components:   Color, Urine STRAW (*)    Leukocytes,Ua TRACE (*)    Bacteria, UA RARE (*)    All other components within normal limits  CBC WITH DIFFERENTIAL/PLATELET  I-STAT BETA HCG BLOOD, ED ([facility], [facility], [facility] ONLY)  CBG MONITORING, ED  SAMPLE TO BLOOD BANK    EKG None  Radiology Dg Chest 2 View  Result Date: 06/30/2019 CLINICAL DATA:  Syncope. EXAM: CHEST - 2 VIEW COMPARISON:  July 03, 2018 FINDINGS: The heart size and mediastinal contours are within normal limits. Both lungs are clear. The visualized skeletal structures are unremarkable. IMPRESSION: No active cardiopulmonary disease. Electronically Signed   By: Dorise Bullion III M.D   On: 06/30/2019 11:58    Procedures Procedures (including critical care time)  Medications Ordered in ED Medications - No data to display   Initial Impression / Assessment and Plan / ED Course  I have reviewed the triage vital signs and the nursing notes.  Pertinent labs & imaging results that were available during my care of the patient were reviewed by me and considered in my medical decision making (see chart for details).  28 year old female appears otherwise well presents for evaluation after syncopal event while getting plasma drawn. No bowel or bladder incontinence, no seizure-like activity.  Patient without preceding aura, sudden onset thunderclap headache.  Vital signs stable, CBG stable per EMS.  She was given 600 mL fluid bolus.  On my evaluation patient is without  any acute complaints, eating crackers and peanut butter.  Nonfocal neurologic exam without deficits.  Heart and lungs clear.  Abdomen soft, nontender without rebound or guarding.  No evidence of DVT on exam.  Obtain basic labs, EKG.  High suspicion for vasovagal as this occurred when she had gone from sitting to standing. No evidence of alcohol intoxication, substance abuse.  Labs and imaging personally reviewed: EKG without ST changes, no tachycardia Chest x-ray negative for infiltrates, cardiomegaly, pulmonary  edema, pneumothorax Urinalysis with trace leukocytes, rare bacteria, she denies any urinary symptoms. Metabolic panel with electrolyte, renal or liver abnormality Pregnancy test negative CBC without leukocytosis Orthostatic vital signs negative  Patient tolerating p.o. intake without difficulty.  She continues to have a nonfocal neurologic exam without deficits.  She continues to be without any symptoms.  Ambulatory without difficulty.  Likely vasovagal syncope given recent blood draw and symptom surrounding event.    Patient without arrhythmia or tachycardia while here in the department.  Patient without history of congestive heart failure, normal hematocrit, normal ECG, no shortness of breath and systolic blood pressure greater than 90; patient is low risk. Will plan for discharge home with close cardiology/PCP follow-up.  Possibility of recurrent syncope has been discussed. I discussed reasons to avoid driving until cardiology/PCP followup and other safety prevention including use of ladders and working at heights.   Pt has remained hemodynamically stable throughout their time in the ED  The patient has been appropriately medically screened and/or stabilized in the ED. I have low suspicion for any other emergent medical condition which would require further screening, evaluation or treatment in the ED or require inpatient management.  Patient is hemodynamically stable and in no acute  distress.  Patient able to ambulate in department prior to ED.  Evaluation does not show acute pathology that would require ongoing or additional emergent interventions while in the emergency department or further inpatient treatment.  I have discussed the diagnosis with the patient and answered all questions.  Pain is been managed while in the emergency department and patient has no further complaints prior to discharge.  Patient is comfortable with plan discussed in room and is stable for discharge at this time.  I have discussed strict return precautions for returning to the emergency department.  Patient was encouraged to follow-up with PCP/specialist refer to at discharge.       Final Clinical Impressions(s) / ED Diagnoses   Final diagnoses:  Syncope, unspecified syncope type    ED Discharge Orders    None       Kinsler Soeder A, PA-C 06/30/19 1311    Charlesetta Shanks, MD 07/01/19 [id number]

## 2019-06-30 NOTE — ED Notes (Signed)
Pt ambulatory to bathroom

## 2019-07-09 ENCOUNTER — Encounter (HOSPITAL_COMMUNITY): Payer: Self-pay

## 2019-07-09 ENCOUNTER — Emergency Department (HOSPITAL_COMMUNITY)
Admission: EM | Admit: 2019-07-09 | Discharge: 2019-07-09 | Disposition: A | Discharge status: Home / Self Care | Payer: Medicaid Other | Attending: Emergency Medicine | Admitting: Emergency Medicine

## 2019-07-09 ENCOUNTER — Other Ambulatory Visit: Payer: Self-pay

## 2019-07-09 DIAGNOSIS — J029 Acute pharyngitis, unspecified: Secondary | ICD-10-CM | POA: Insufficient documentation

## 2019-07-09 LAB — GROUP A STREP BY PCR: Group A Strep by PCR: NOT DETECTED

## 2019-07-09 NOTE — ED Triage Notes (Signed)
Pt reports sore throat that started this morning and runny nose. Pt denies being around anybody with COVID. Pt denies any other symptoms.

## 2019-07-09 NOTE — Discharge Instructions (Signed)
You were seen in the ER today for a sore throat.  Your strep test was negative.  Please take Tylenol and utilize Flonase per over-the-counter dosing to help with your pain and congestion.  Please follow-up with your primary care provider in 1 week.  Return to the ER for new or worsening symptoms including but not limited toWorsening pain, inability to swallow,.Fever, trouble breathing, or any other concerns.

## 2019-07-09 NOTE — ED Notes (Signed)
Pt refused discharge vital signs

## 2019-07-09 NOTE — ED Provider Notes (Signed)
[facility] DEPT Provider Note   CSN: [id number] Arrival date & time: 07/09/19  1507     History   Chief Complaint Chief Complaint  Patient presents with  . Sore Throat    HPI Latoya [name] is a 28 y.o. female with a hx of anxiety, depression, Crohn's disease, & substance abuse who presents to the ED the with complaints of sore throat that began this AM. Patient states pain is constant, currently a 3/10 in severity, worse with swallowing but able to swallow, no alleviating factors. No intervention PTA. States she has some nasal congestion. She wants to be checked for strep test. Denies fever, chills, nausea, vomiting, ear pain, cough, or dyspnea. Denies change in taste/smell or recent COVID exposures.      HPI  Past Medical History:  Diagnosis Date  . Anemia   . Anxiety   . Arthritis   . Blood transfusion without reported diagnosis   . Crohn's disease ([place])   . Depression   . Drug-seeking behavior   . Gestational diabetes   . History of substance abuse ([place])   . HSV infection   . Pregnancy induced hypertension   . Substance abuse [facility])     Patient Active Problem List   Diagnosis Date Noted  . Post partum depression 07/12/2017  . IBD (inflammatory bowel disease)   . Acute pancreatitis   . Iron deficiency anemia due to chronic blood loss   . Heme positive stool   . Pancolitis ([place]) 07/04/2017  . Generalized abdominal pain   . Diarrhea   . Major depressive disorder, recurrent severe without psychotic features ([place]) 06/29/2017  . Cocaine abuse ([place]) 06/29/2017  . SVD (spontaneous vaginal delivery) 05/15/2017  . Crack cocaine use 05/15/2017  . Cervical dysplasia 01/16/2017  . Obesity (BMI 30.0-34.9) 01/12/2017  . History of substance abuse ([place]) 01/12/2017  . History of gestational diabetes mellitus (GDM) 01/12/2017  . History of gestational hypertension 01/12/2017  . History of macrosomia in infant in prior pregnancy,  currently pregnant 01/12/2017  . Short interval between pregnancies affecting pregnancy in second trimester, antepartum 01/12/2017  . Late prenatal care 01/12/2017  . Paxil use in early pregnancy 01/12/2017  . History of suicide attempt 01/12/2017  . Anxiety and depression 01/12/2017  . Supervision of high risk pregnancy, antepartum 01/10/2017  . GDM (gestational diabetes mellitus) 02/25/2016  . Obesity in pregnancy 02/25/2016    Past Surgical History:  Procedure Laterality Date  . CHOLECYSTECTOMY    . COLONOSCOPY N/A 07/07/2017   Procedure: COLONOSCOPY;  Surgeon: Juanita Craver, MD;  [facility] END[facility]COPY;  Service: Endoscopy;  Laterality: N/A;  . DEEP NECK LYMPH NODE BIOPSY / EXCISION  2011     OB History    Gravida  2   Para  2   Term  2   Preterm  0   AB  0   Living  2     SAB  0   TAB  0   Ectopic  0   Multiple  0   Live Births  2            Home Medications    Prior to Admission medications   Medication Sig Start Date End Date Taking? Authorizing Provider  hyoscyamine (LEVSIN SL) 0.125 MG SL tablet Place 1 tablet (0.125 mg total) every 4 (four) hours as needed under the tongue. Patient not taking: Reported on 07/03/2018 08/03/17   Levin Erp, PA  lidocaine (XYLOCAINE) 2 % solution  Apply 5 ml to the affected areas every 3 hours as needed for pain. You may spit out the excess. Patient not taking: Reported on 06/30/2019 11/19/18   Ashley Murrain, NP  mesalamine (APRISO) 0.375 g 24 hr capsule Take 4 capsules (1.5 g total) by mouth daily. Patient not taking: Reported on 07/03/2018 11/26/17   Mauri Pole, MD  omeprazole (PRILOSEC) 40 MG capsule TAKE 1 CAPSULE (40 MG TOTAL) DAILY BY MOUTH. Patient not taking: Reported on 07/03/2018 11/19/17   Mauri Pole, MD  PARoxetine (PAXIL-CR) 25 MG 24 hr tablet Take 1 tablet (25 mg total) by mouth daily. Patient not taking: Reported on 07/03/2018 05/16/17   Emily Filbert, MD    Family History Family  History  Problem Relation Age of Onset  . Diabetes Mother   . Hypertension Mother   . Hypertension Father   . Stroke Maternal Grandfather   . Colon cancer Neg Hx   . Stomach cancer Neg Hx     Social History Social History   Tobacco Use  . Smoking status: Never Smoker  . Smokeless tobacco: Never Used  Substance Use Topics  . Alcohol use: No    Comment: "not for a while"  . Drug use: Yes    Types: "Crack" cocaine, Cocaine    Comment: cocai, former user, 06/27/2017     Allergies   Patient has no known allergies.   Review of Systems Review of Systems  Constitutional: Negative for chills and fever.       Negative for change in smell/taste  HENT: Positive for congestion and sore throat. Negative for ear pain and voice change.   Respiratory: Negative for cough and shortness of breath.   Cardiovascular: Negative for chest pain.  Gastrointestinal: Negative for abdominal pain, nausea and vomiting.     Physical Exam Updated Vital Signs BP 133/87 (BP Location: Left Arm)   Pulse 92   Temp 99.5 F (37.5 C) (Oral)   Resp 16   SpO2 100%   Physical Exam Vitals signs and nursing note reviewed.  Constitutional:      General: She is not in acute distress.    Appearance: She is well-developed.  HENT:     Head: Normocephalic and atraumatic.     Right Ear: Ear canal normal. Tympanic membrane is not perforated, erythematous, retracted or bulging.     Left Ear: Ear canal normal. Tympanic membrane is not perforated, erythematous, retracted or bulging.     Ears:     Comments: No mastoid erythema/swelling/tenderness.     Nose:     Right Sinus: No maxillary sinus tenderness or frontal sinus tenderness.     Left Sinus: No maxillary sinus tenderness or frontal sinus tenderness.     Mouth/Throat:     Pharynx: Uvula midline. Posterior oropharyngeal erythema present. No oropharyngeal exudate.     Comments: Posterior oropharynx is symmetric appearing. Patient tolerating own secretions  without difficulty. No trismus. No drooling. No hot potato voice. No swelling beneath the tongue, submandibular compartment is soft.  Eyes:     General:        Right eye: No discharge.        Left eye: No discharge.     Conjunctiva/sclera: Conjunctivae normal.     Pupils: Pupils are equal, round, and reactive to light.  Neck:     Musculoskeletal: Normal range of motion and neck supple. No edema or neck rigidity.  Cardiovascular:     Rate and Rhythm: Normal rate and  regular rhythm.     Heart sounds: No murmur.  Pulmonary:     Effort: Pulmonary effort is normal. No respiratory distress.     Breath sounds: Normal breath sounds. No wheezing, rhonchi or rales.  Abdominal:     General: There is no distension.     Palpations: Abdomen is soft.     Tenderness: There is no abdominal tenderness.  Lymphadenopathy:     Cervical: No cervical adenopathy.  Skin:    General: Skin is warm and dry.     Findings: No rash.  Neurological:     Mental Status: She is alert.  Psychiatric:        Behavior: Behavior normal.      ED Treatments / Results  Labs (all labs ordered are listed, but only abnormal results are displayed) Labs Reviewed  GROUP A STREP BY PCR    EKG None  Radiology No results found.  Procedures Procedures (including critical care time)  Medications Ordered in ED Medications - No data to display   Initial Impression / Assessment and Plan / ED Course  I have reviewed the triage vital signs and the nursing notes.  Pertinent labs & imaging results that were available during my care of the patient were reviewed by me and considered in my medical decision making (see chart for details).   Patient presents to the emergency department for sore throat that began today.  Patient nontoxic-appearing, no apparent distress, vitals WNL.  On exam patient has some mild posterior oropharyngeal erythema.  Exam not consistent with RPA/PTA.  Strep test negative.  Likely viral versus  allergic.  Recommended Flonase and Tylenol per over-the-counter dosing.  PCP follow-up. I discussed results, treatment plan, need for follow-up, and return precautions with the patient. Provided opportunity for questions, patient confirmed understanding and is in agreement with plan.    Any Mcneice Manna was evaluated in Emergency Department on 07/09/2019 for the symptoms described in the history of present illness. He/she was evaluated in the context of the global COVID-19 pandemic, which necessitated consideration that the patient might be at risk for infection with the SARS-CoV-2 virus that causes COVID-19. Institutional protocols and algorithms that pertain to the evaluation of patients at risk for COVID-19 are in a state of rapid change based on information released by regulatory bodies including the CDC and federal and state organizations. These policies and algorithms were followed during the patient's care in the ED.  Final Clinical Impressions(s) / ED Diagnoses   Final diagnoses:  Sore throat    ED Discharge Orders    None       Amaryllis Dyke, PA-C 07/09/19 1821    Sherwood Gambler, MD 07/10/19 1510

## 2019-07-13 IMAGING — US US MFM OB FOLLOW-UP
1 series · 13 of 28 positions shown · non-contrast
Comparison: none

[Series 1: us mfm ob follow-up · 13 of 46 slices shown]
[im 2/46]
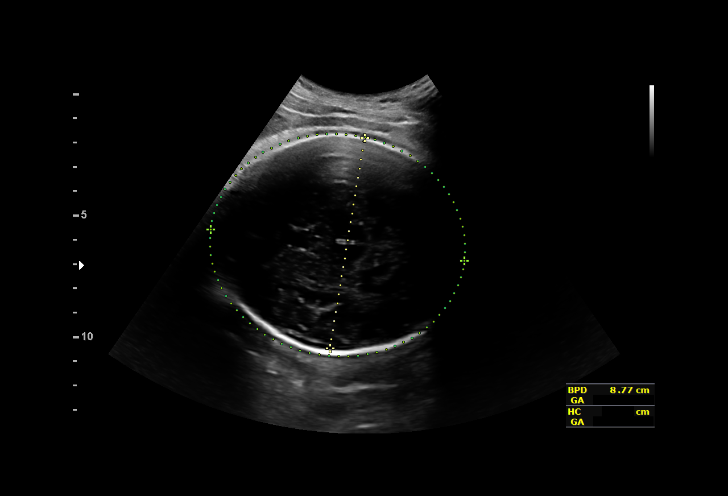
[im 6/46]
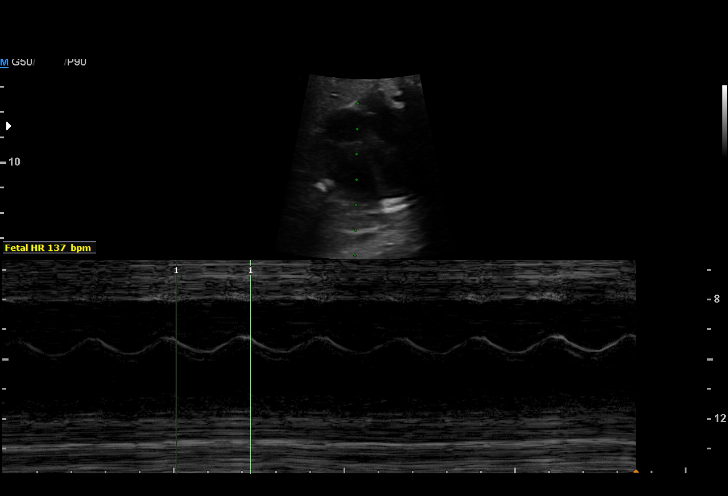
[im 9/46]
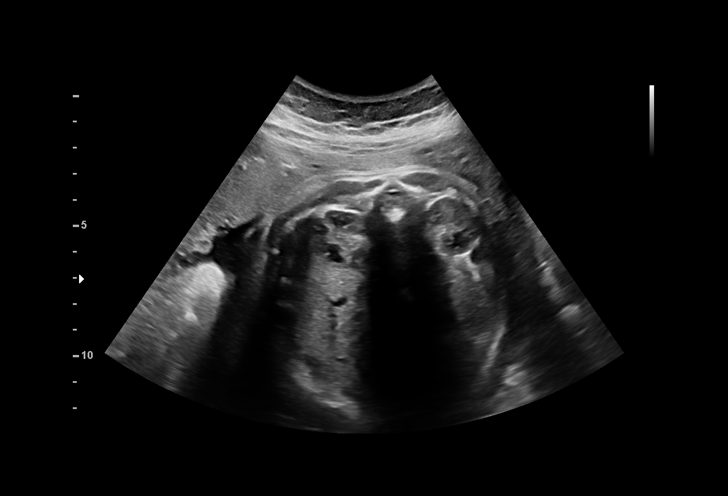
[im 12/46]
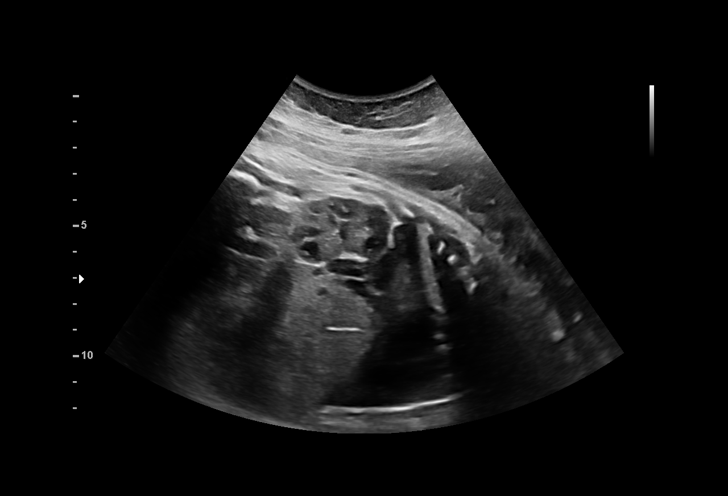
[im 16/46]
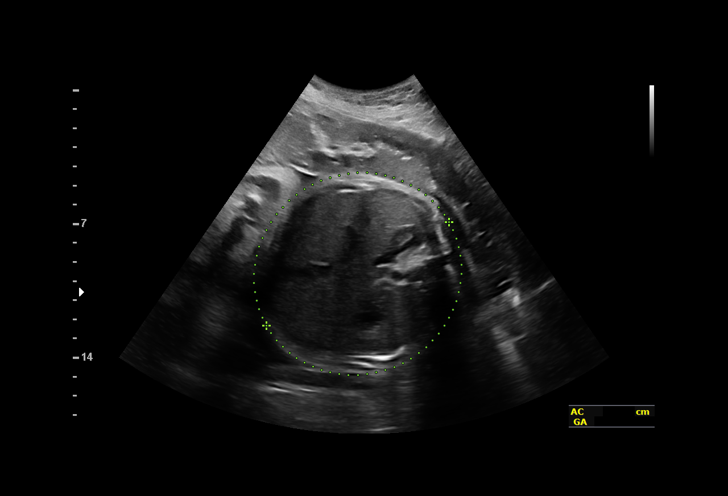
[im 19/46]
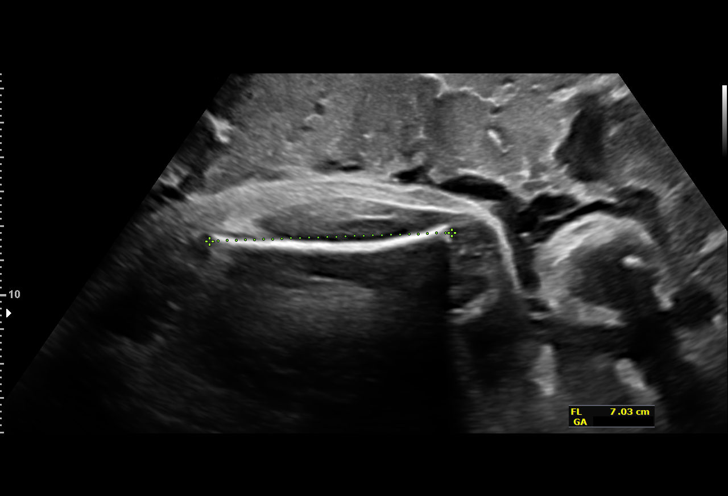
[im 24/46]
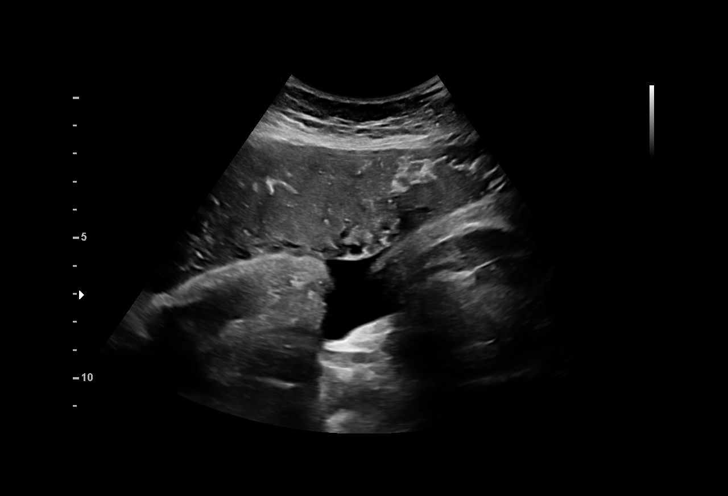
[im 27/46]
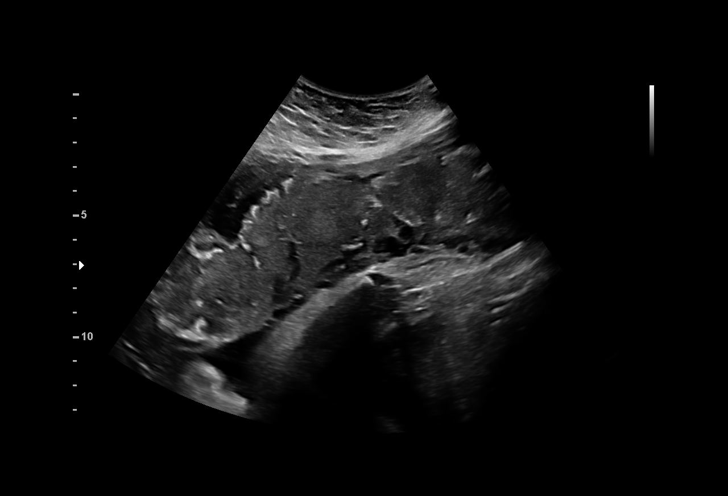
[im 31/46]
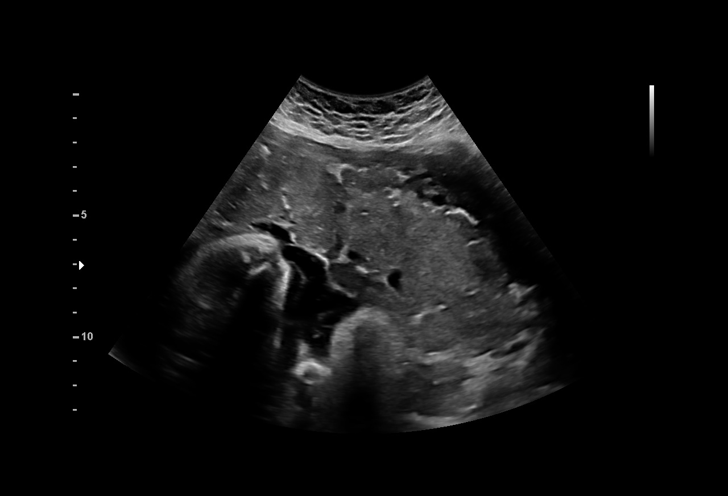
[im 34/46]
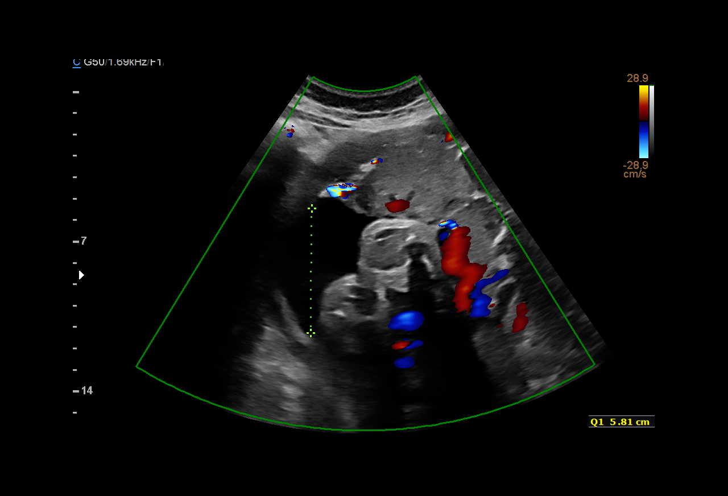
[im 37/46]
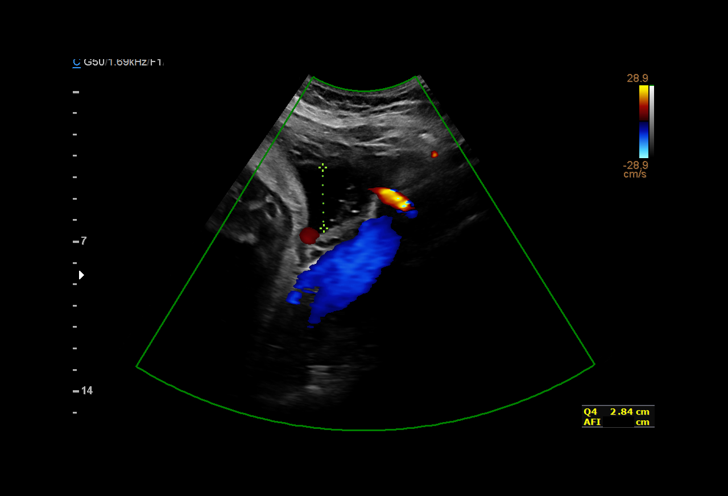
[im 41/46]
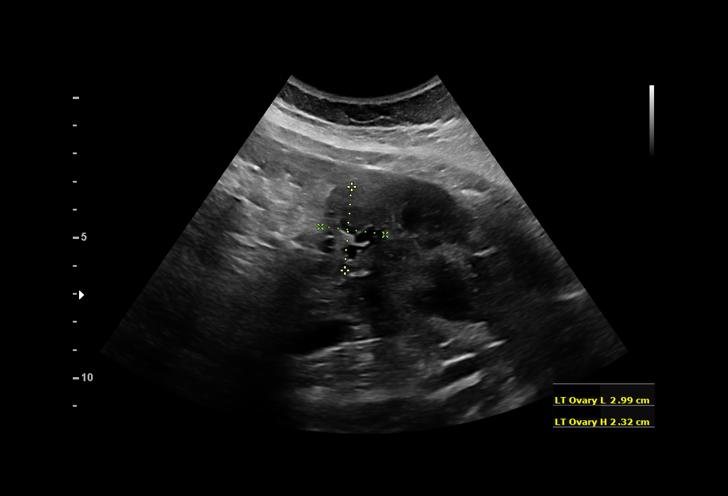
[im 44/46]
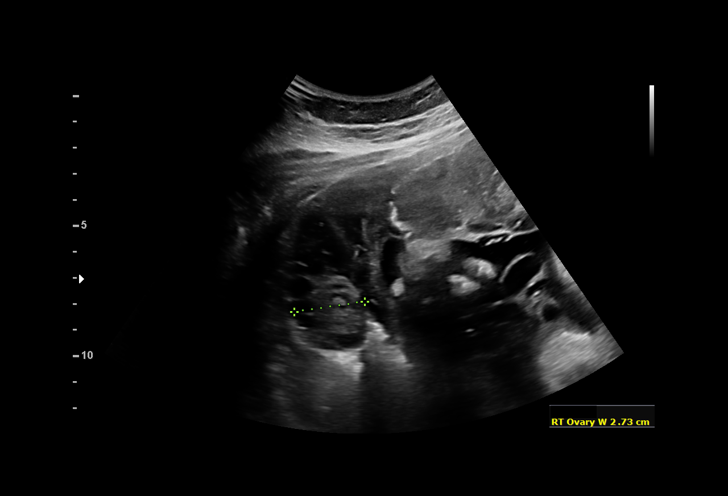

[13 of 28 positions shown; findings below may reference images not displayed]

1  SHAYLA JUMPER            [id number]      [id number]     [id number]
Indications

36 weeks gestation of pregnancy
Poor obstetric history: Previous gestational
diabetes, fetal macrosomia, preeclampsia
Substance abuse affecting pregnancy,
antepartum
Obesity complicating pregnancy, third
trimester
Advanced Paternal Age - Encounter for
procreative genetic counseling
Medication exposure during first trimester of
pregnancy (Paxil)
Short interval between pregancies, 3rd
trimester
Late to prenatal care, third trimester
Gestational diabetes in pregnancy, diet
controlled
Encounter for other antenatal screening
follow-up
OB History

Blood Type:            Height:  5'9"   Weight (lb):  227      BMI:
Gravidity:    2         Term:   1
Living:       1
Fetal Evaluation

Num Of Fetuses:     1
Fetal Heart         137
Rate(bpm):
Cardiac Activity:   Observed
Presentation:       Cephalic
Placenta:           Anterior, above cervical os
P. Cord Insertion:  Previously Visualized

Amniotic Fluid
AFI FV:      Subjectively within normal limits

AFI Sum(cm)     %Tile       Largest Pocket(cm)
10.51           27

RUQ(cm)       RLQ(cm)       LUQ(cm)        LLQ(cm)
5.81          2.84          1.86           0
Biometry

BPD:      88.1  mm     G. Age:  35w 4d         34  %    CI:        80.71   %   70 - 86
FL/HC:      22.6   %   20.8 -
HC:      309.7  mm     G. Age:  34w 4d        < 3  %    HC/AC:      0.93       0.92 -
AC:      333.2  mm     G. Age:  37w 2d         78  %    FL/BPD:     79.3   %   71 - 87
FL:       69.9  mm     G. Age:  35w 6d         30  %    FL/AC:      21.0   %   20 - 24
HUM:      59.2  mm     G. Age:  34w 2d         26  %
Est. FW:    5091  gm      6 lb 7 oz     63  %
Gestational Age

LMP:           39w 3d       Date:   07/28/16                 EDD:   05/04/17
U/S Today:     35w 6d                                        EDD:   05/29/17
Best:          36w 4d    Det. By:   Early Ultrasound         EDD:   05/24/17
(10/10/16)
Anatomy

Cranium:               Appears normal         Aortic Arch:            Previously seen
Cavum:                 Appears normal         Ductal Arch:            Previously seen
Ventricles:            Appears normal         Diaphragm:              Previously seen
Choroid Plexus:        Previously seen        Stomach:                Appears normal, left
sided
Cerebellum:            Previously seen        Abdomen:                Appears normal
Posterior Fossa:       Previously seen        Abdominal Wall:         Previously seen
Nuchal Fold:           Not applicable (>20    Cord Vessels:           Previously seen
wks GA)
Face:                  Orbits previously      Kidneys:                Appear normal
seen
Lips:                  Previously seen        Bladder:                Appears normal
Thoracic:              Appears normal         Spine:                  Previously seen
Heart:                 Previously seen        Upper Extremities:      Previously seen
RVOT:                  Previously seen        Lower Extremities:      Previously seen
LVOT:                  Previously seen

Other:  Fetus appears to be a female prev seen. Heels previously
seen.Technically difficult due to maternal habitus and fetal position.
Cervix Uterus Adnexa

Cervix
Not visualized (advanced GA >23wks)

Uterus
No abnormality visualized.
Left Ovary
Size(cm)        3  x    2.3    x  1.6       Vol(ml):
Within normal limits.

Right Ovary
Size(cm)       3.6 x    2.3    x  2.7       Vol(ml):
Within normal limits.
Impression

SIUP at 36+4 weeks with GDM, history of macrosomic infant,
and history of substance abuse
Normal interval anatomy; anatomic survey complete
Normal amniotic fluid volume
Appropriate interval growth with EFW at the 63rd percentile
Recommendations

Follow-up ultrasounds as clinically indicated.

## 2019-10-14 IMAGING — CR DG ABDOMEN ACUTE W/ 1V CHEST
3 series · 3 of 3 positions shown · non-contrast
Comparison: Chest radiograph 07/04/2017. CT abdomen and pelvis
07/09/2017

CLINICAL DATA: Left-sided abdominal pain today. Chest pain. History
of Crohn disease. Recent gallbladder surgery.

EXAM:
DG ABDOMEN ACUTE W/ 1V CHEST

[w chest pa]
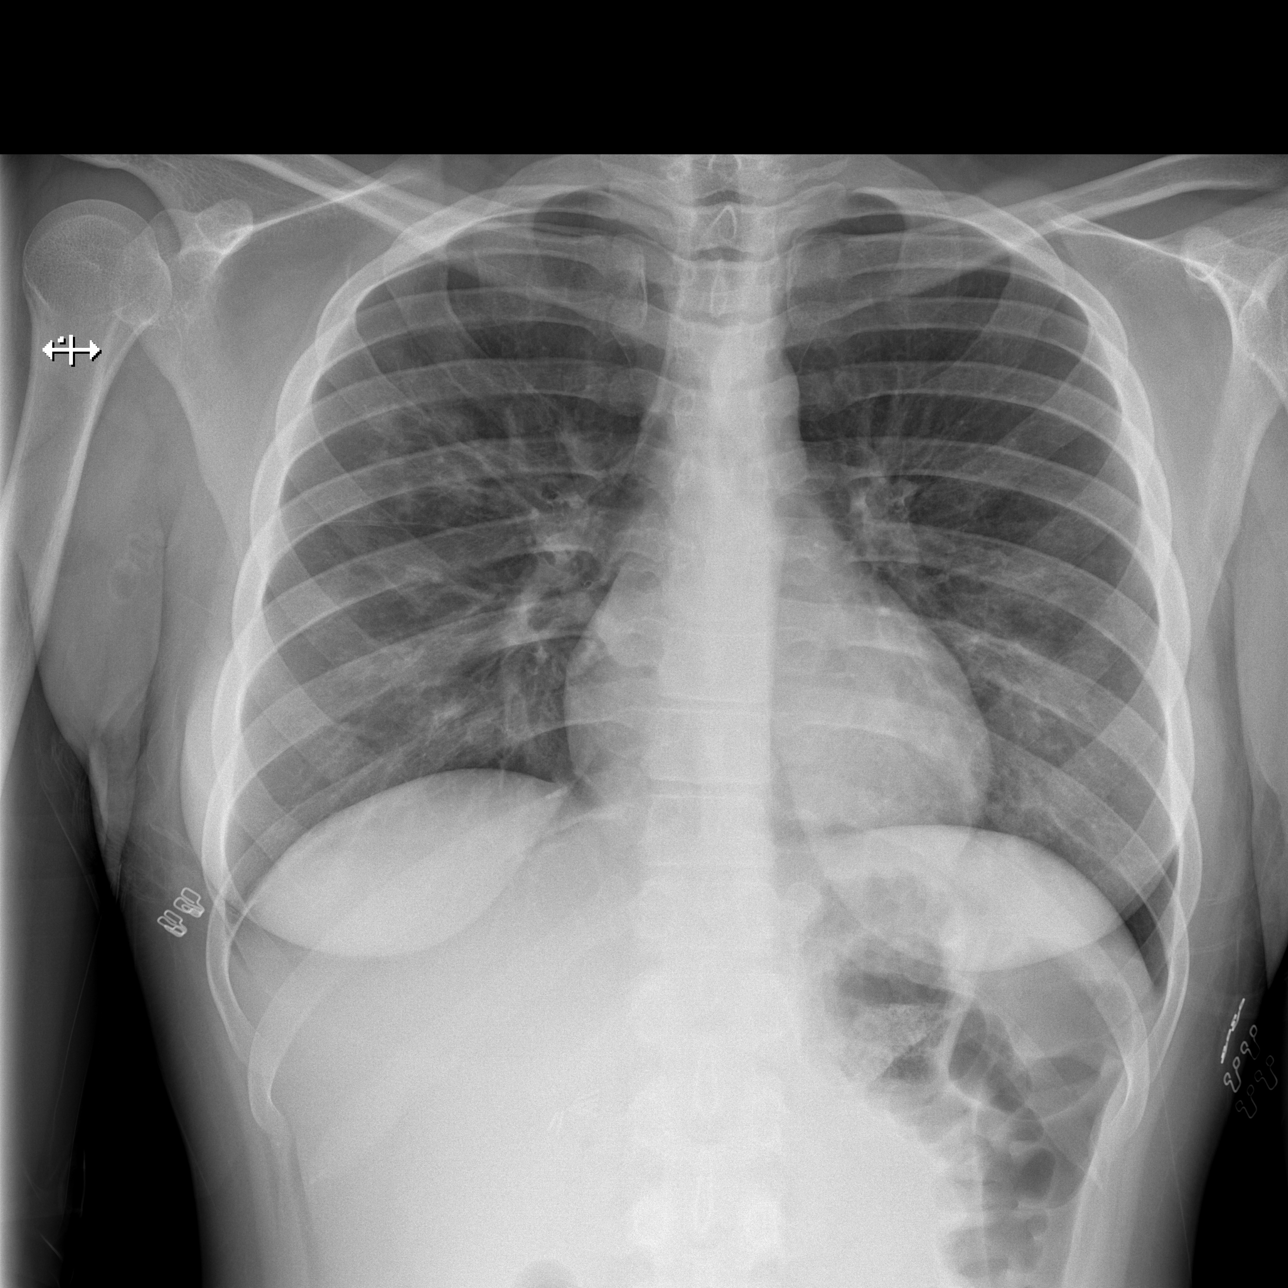

[w abdomen upright]
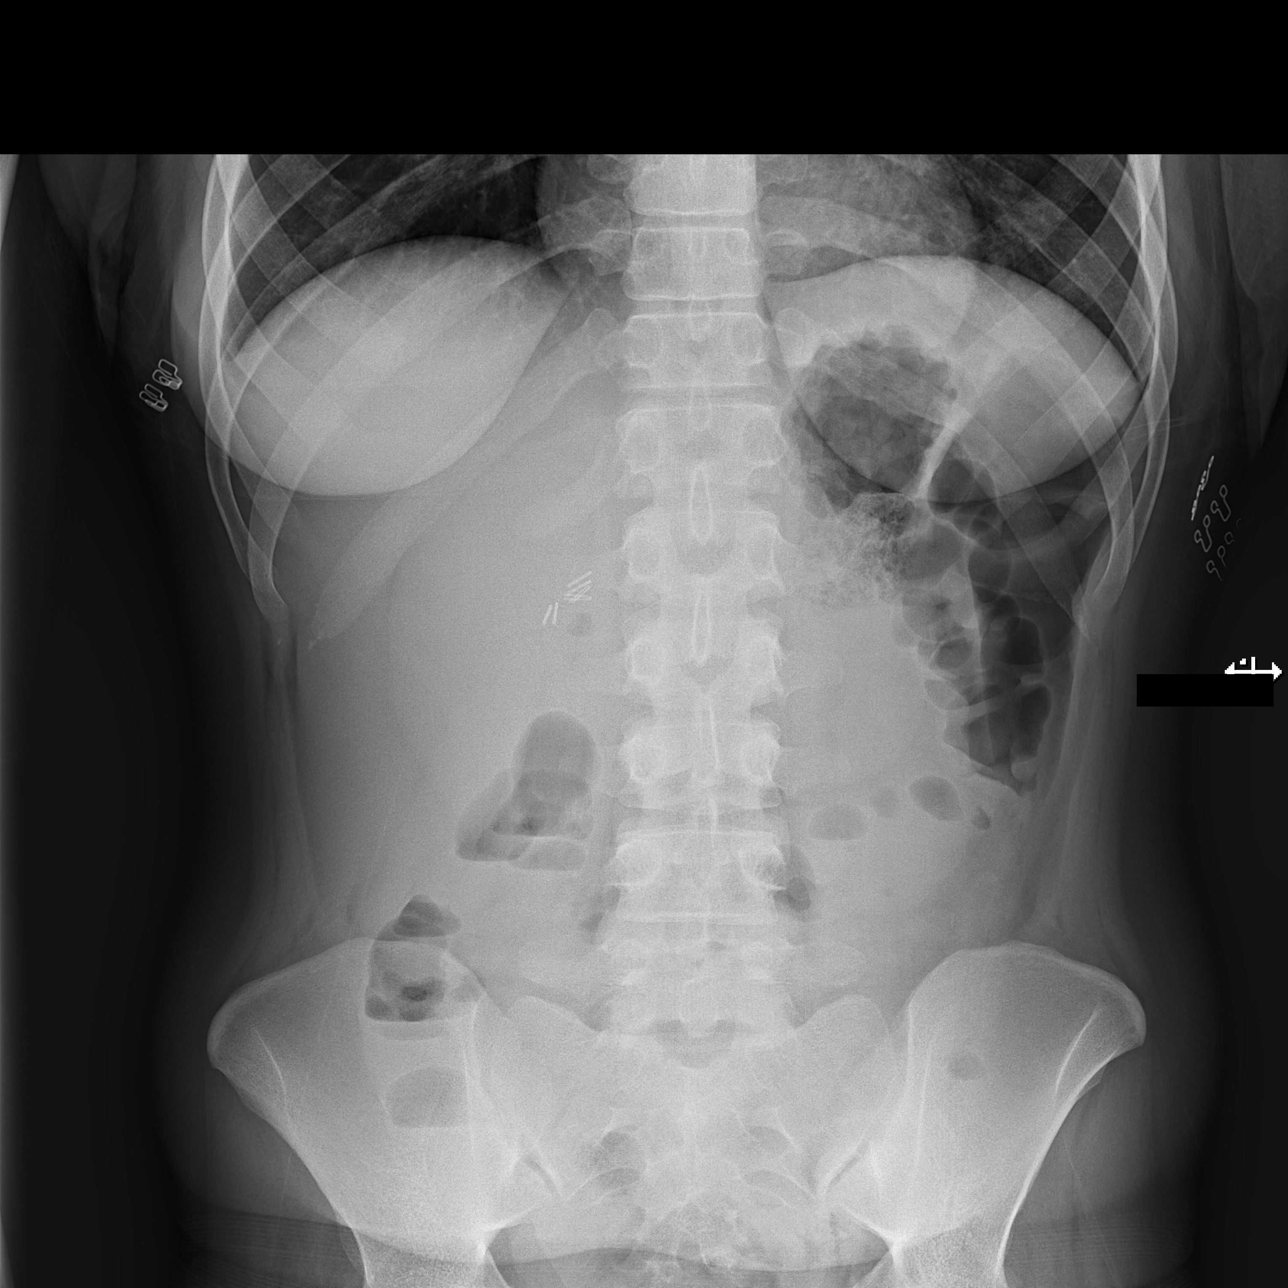

[t abdomen supine]
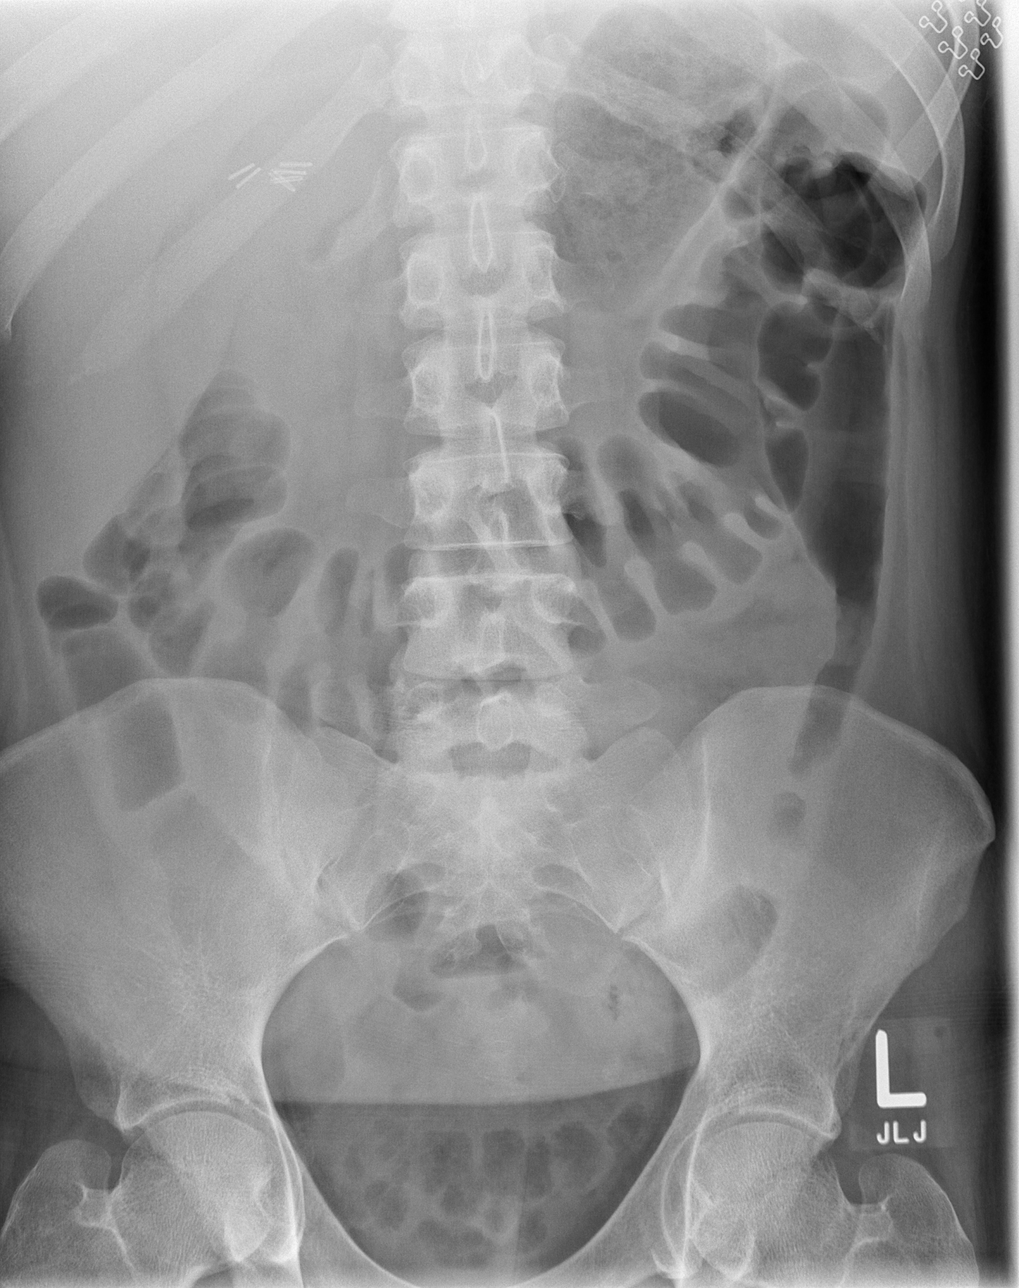

[3 of 3 positions shown; findings below may reference images not displayed]

FINDINGS: Heart size and pulmonary vascularity are normal. Developing patchy
perihilar infiltrates with peribronchial thickening suggesting
developing acute bronchitis. No focal consolidation or volume loss.
No blunting of costophrenic angles. No pneumothorax.

Scattered gas and stool in the colon. No small or large bowel
distention. No free intra-abdominal air. No abnormal air-fluid
levels. No radiopaque stones. Visualized bones appear intact.
Surgical clips in the right upper quadrant.
IMPRESSION: New peribronchial thickening and perihilar opacities suggesting
developing bronchitis. Normal nonobstructive bowel gas pattern.

## 2019-10-21 ENCOUNTER — Emergency Department (HOSPITAL_COMMUNITY)
Admission: EM | Admit: 2019-10-21 | Discharge: 2019-10-21 | Disposition: A | Discharge status: Home / Self Care | Payer: Medicaid Other | Attending: Emergency Medicine | Admitting: Emergency Medicine

## 2019-10-21 ENCOUNTER — Encounter (HOSPITAL_COMMUNITY): Payer: Self-pay

## 2019-10-21 ENCOUNTER — Emergency Department (HOSPITAL_COMMUNITY): Payer: Medicaid Other

## 2019-10-21 ENCOUNTER — Other Ambulatory Visit: Payer: Self-pay

## 2019-10-21 ENCOUNTER — Emergency Department (HOSPITAL_COMMUNITY)
Admission: EM | Admit: 2019-10-21 | Discharge: 2019-10-21 | Disposition: A | Discharge status: Home / Self Care | Payer: Medicaid Other | Source: Home / Self Care | Attending: Emergency Medicine | Admitting: Emergency Medicine

## 2019-10-21 DIAGNOSIS — R070 Pain in throat: Secondary | ICD-10-CM | POA: Diagnosis not present

## 2019-10-21 DIAGNOSIS — R0602 Shortness of breath: Secondary | ICD-10-CM | POA: Insufficient documentation

## 2019-10-21 DIAGNOSIS — U071 COVID-19: Secondary | ICD-10-CM

## 2019-10-21 DIAGNOSIS — M7918 Myalgia, other site: Secondary | ICD-10-CM | POA: Diagnosis not present

## 2019-10-21 DIAGNOSIS — J029 Acute pharyngitis, unspecified: Secondary | ICD-10-CM

## 2019-10-21 DIAGNOSIS — Z20822 Contact with and (suspected) exposure to covid-19: Secondary | ICD-10-CM | POA: Diagnosis not present

## 2019-10-21 DIAGNOSIS — R509 Fever, unspecified: Secondary | ICD-10-CM | POA: Diagnosis present

## 2019-10-21 LAB — GROUP A STREP BY PCR: Group A Strep by PCR: NOT DETECTED

## 2019-10-21 LAB — SARS CORONAVIRUS 2 (TAT 6-24 HRS): SARS Coronavirus 2: NEGATIVE

## 2019-10-21 MED ORDER — KETOROLAC TROMETHAMINE 30 MG/ML IJ SOLN
30.0000 mg | Freq: Once | INTRAMUSCULAR | Status: DC
  Filled 2019-10-21: qty 1

## 2019-10-21 MED ORDER — DEXAMETHASONE SODIUM PHOSPHATE 10 MG/ML IJ SOLN
10.0000 mg | Freq: Once | INTRAMUSCULAR | Status: AC
  Administered 2019-10-21: 23:00:00 10 mg via INTRAMUSCULAR

## 2019-10-21 MED ORDER — DEXAMETHASONE SODIUM PHOSPHATE 10 MG/ML IJ SOLN
10.0000 mg | Freq: Once | INTRAMUSCULAR | Status: DC
  Filled 2019-10-21: qty 1

## 2019-10-21 MED ORDER — KETOROLAC TROMETHAMINE 30 MG/ML IJ SOLN
30.0000 mg | Freq: Once | INTRAMUSCULAR | Status: AC
  Administered 2019-10-21: 23:00:00 30 mg via INTRAMUSCULAR

## 2019-10-21 MED ORDER — LIDOCAINE VISCOUS HCL 2 % MT SOLN: 15.0000 mL | OROMUCOSAL | 0 refills | Status: DC | PRN

## 2019-10-21 MED ORDER — ACETAMINOPHEN 325 MG PO TABS
650.0000 mg | ORAL_TABLET | Freq: Once | ORAL | Status: AC
  Administered 2019-10-21: 22:00:00 650 mg via ORAL
  Filled 2019-10-21: qty 2

## 2019-10-21 MED ORDER — LIDOCAINE VISCOUS HCL 2 % MT SOLN
15.0000 mL | Freq: Once | OROMUCOSAL | Status: AC
  Administered 2019-10-21: 15 mL via OROMUCOSAL
  Filled 2019-10-21: qty 15

## 2019-10-21 MED ORDER — ACETAMINOPHEN 500 MG PO TABS
1000.0000 mg | ORAL_TABLET | Freq: Once | ORAL | Status: AC
  Administered 2019-10-21: 1000 mg via ORAL
  Filled 2019-10-21: qty 2

## 2019-10-21 NOTE — Discharge Instructions (Signed)
As discussed, your strep test was negative. You were given pain medication and steroids here in the ED to help with the sore throat. Your COVID test is negative. I am sending you home with numbing solution for your throat. You may take over the counter ibuprofen or tylenol as needed for pain. Follow-up with PCP if your symptoms do not improve within the next few days. Return to the ER for new or worsening symptoms.

## 2019-10-21 NOTE — ED Triage Notes (Signed)
Patient arrived stating her niece was diagnosed with Covid-19. States she now has generalized body aches, a cough, and at times feels short of breath when talking. Reports taking ibuprofen at home. In no distress during triage.

## 2019-10-21 NOTE — ED Triage Notes (Signed)
Patient arrived today stating she has had a fever and sore throat since yesterday. Negative Covid-19 result in chart. Reporting pain has gotten worse and concerned about strep throat.

## 2019-10-21 NOTE — ED Notes (Signed)
Patient came to nurses station stating she has to go and will look on mychart for any prescriptions. Declines wanting to wait for paperwork.

## 2019-10-21 NOTE — ED Notes (Addendum)
Pt states her niece tested positive for Covid 3 days ago. Pt states she started feeling sick today. Pt c/o body aches and sore throat. Pt denies cough, Pt states "I only have trouble breathing if I talk to much". Pt denies nausea, vomiting, diarrhea or headache.

## 2019-10-21 NOTE — ED Provider Notes (Signed)
[facility] DEPT Provider Note   CSN: [id number] Arrival date & time: 10/21/19  2008     History Chief Complaint  Patient presents with  . Sore Throat    Latoya [name] is a 29 y.o. female with a past medical history significant for anemia, anxiety, Crohn's disease, depression, drug-seeking behavior, and history of substance abuse presents to the ED due to worsening sore throat and fever x2 days.  Patient was seen in the ED this morning for the same complaint where she was tested for Covid which was negative.  Patient rates her pain a 6/10 worse when swallowing.  Patient states her voice sounds "squeaky" in nature.  Patient denies trismus, difficulties breathing, and difficulties swallowing.  Patient has tried over-the-counter Tylenol with no relief. Patient admits to engaging in oral intercourse with a new partner recently.     Past Medical History:  Diagnosis Date  . Anemia   . Anxiety   . Arthritis   . Blood transfusion without reported diagnosis   . Crohn's disease ([place])   . Depression   . Drug-seeking behavior   . Gestational diabetes   . History of substance abuse ([place])   . HSV infection   . Pregnancy induced hypertension   . Substance abuse [facility])     Patient Active Problem List   Diagnosis Date Noted  . Post partum depression 07/12/2017  . IBD (inflammatory bowel disease)   . Acute pancreatitis   . Iron deficiency anemia due to chronic blood loss   . Heme positive stool   . Pancolitis ([place]) 07/04/2017  . Generalized abdominal pain   . Diarrhea   . Major depressive disorder, recurrent severe without psychotic features ([place]) 06/29/2017  . Cocaine abuse ([place]) 06/29/2017  . SVD (spontaneous vaginal delivery) 05/15/2017  . Crack cocaine use 05/15/2017  . Cervical dysplasia 01/16/2017  . Obesity (BMI 30.0-34.9) 01/12/2017  . History of substance abuse ([place]) 01/12/2017  . History of gestational diabetes mellitus (GDM) 01/12/2017    . History of gestational hypertension 01/12/2017  . History of macrosomia in infant in prior pregnancy, currently pregnant 01/12/2017  . Short interval between pregnancies affecting pregnancy in second trimester, antepartum 01/12/2017  . Late prenatal care 01/12/2017  . Paxil use in early pregnancy 01/12/2017  . History of suicide attempt 01/12/2017  . Anxiety and depression 01/12/2017  . Supervision of high risk pregnancy, antepartum 01/10/2017  . GDM (gestational diabetes mellitus) 02/25/2016  . Obesity in pregnancy 02/25/2016    Past Surgical History:  Procedure Laterality Date  . CHOLECYSTECTOMY    . COLONOSCOPY N/A 07/07/2017   Procedure: COLONOSCOPY;  Surgeon: Juanita Craver, MD;  Location: [facility] ENDOSCOPY;  Service: Endoscopy;  Laterality: N/A;  . DEEP NECK LYMPH NODE BIOPSY / EXCISION  2011     OB History    Gravida  2   Para  2   Term  2   Preterm  0   AB  0   Living  2     SAB  0   TAB  0   Ectopic  0   Multiple  0   Live Births  2           Family History  Problem Relation Age of Onset  . Diabetes Mother   . Hypertension Mother   . Hypertension Father   . Stroke Maternal Grandfather   . Colon cancer Neg Hx   . Stomach cancer Neg Hx     Social History  Tobacco Use  . Smoking status: Never Smoker  . Smokeless tobacco: Never Used  Substance Use Topics  . Alcohol use: No    Comment: "not for a while"  . Drug use: Yes    Types: "Crack" cocaine, Cocaine    Comment: cocai, former user, 06/27/2017    Home Medications Prior to Admission medications   Medication Sig Start Date End Date Taking? Authorizing Provider  lidocaine (XYLOCAINE) 2 % solution Use as directed 15 mLs in the mouth or throat as needed for mouth pain. 10/21/19   Suzy Bouchard, PA-C  hyoscyamine (LEVSIN SL) 0.125 MG SL tablet Place 1 tablet (0.125 mg total) every 4 (four) hours as needed under the tongue. Patient not taking: Reported on 07/03/2018 08/03/17 07/09/19   Levin Erp, PA  mesalamine (APRISO) 0.375 g 24 hr capsule Take 4 capsules (1.5 g total) by mouth daily. Patient not taking: Reported on 07/03/2018 11/26/17 07/09/19  Mauri Pole, MD  omeprazole (PRILOSEC) 40 MG capsule TAKE 1 CAPSULE (40 MG TOTAL) DAILY BY MOUTH. Patient not taking: Reported on 07/03/2018 11/19/17 07/09/19  Mauri Pole, MD  PARoxetine (PAXIL-CR) 25 MG 24 hr tablet Take 1 tablet (25 mg total) by mouth daily. Patient not taking: Reported on 07/03/2018 05/16/17 07/09/19  Emily Filbert, MD    Allergies    Patient has no known allergies.  Review of Systems   Review of Systems  Constitutional: Positive for fever.  HENT: Positive for sore throat and voice change. Negative for congestion, rhinorrhea, sinus pressure and trouble swallowing.   Respiratory: Negative for cough and shortness of breath.   Cardiovascular: Negative for chest pain.  Gastrointestinal: Negative for abdominal pain, constipation, diarrhea and vomiting.    Physical Exam Updated Vital Signs BP 112/73 (BP Location: Right Arm)   Pulse (!) 117   Temp (!) 101 F (38.3 C) (Oral)   Resp 20   SpO2 98%   Physical Exam Vitals and nursing note reviewed.  Constitutional:      General: She is not in acute distress.    Appearance: She is not toxic-appearing.  HENT:     Head: Normocephalic.     Right Ear: Tympanic membrane normal.     Left Ear: Tympanic membrane normal.     Nose: Nose normal.     Mouth/Throat:     Comments: Posterior oropharynx clear and mucous membranes moist, there is mild erythema with 1+ tonsillar hypertrophy and exudate on right tonsil, uvula midline, mild muffled voice, no trismus, tolerating secretions without difficulty. No abscess appreciated.   Eyes:     Conjunctiva/sclera: Conjunctivae normal.  Neck:     Comments: No meningismus Cardiovascular:     Rate and Rhythm: Regular rhythm. Tachycardia present.     Pulses: Normal pulses.     Heart sounds: Normal  heart sounds. No murmur. No friction rub. No gallop.   Pulmonary:     Effort: Pulmonary effort is normal.     Breath sounds: Normal breath sounds.  Abdominal:     General: Abdomen is flat. There is no distension.     Palpations: Abdomen is soft.     Tenderness: There is no abdominal tenderness. There is no guarding or rebound.  Musculoskeletal:     Cervical back: Neck supple.     Comments: Able to move all 4 extremities without difficulty.   Lymphadenopathy:     Cervical: Cervical adenopathy present.  Skin:    General: Skin is warm and dry.  Neurological:  General: No focal deficit present.     Mental Status: She is alert.     ED Results / Procedures / Treatments   Labs (all labs ordered are listed, but only abnormal results are displayed) Labs Reviewed  GROUP A STREP BY PCR  GC/CHLAMYDIA PROBE AMP ([place]) NOT AT [facility]    EKG None  Radiology DG Chest Portable 1 View  Result Date: 10/21/2019 CLINICAL DATA:  COVID.  Shortness of breath, fever EXAM: PORTABLE CHEST 1 VIEW COMPARISON:  06/30/2019 FINDINGS: The heart size and mediastinal contours are within normal limits. Both lungs are clear. The visualized skeletal structures are unremarkable. IMPRESSION: Negative. Electronically Signed   By: Rolm Baptise M.D.   On: 10/21/2019 01:39    Procedures Procedures (including critical care time)  Medications Ordered in ED Medications  acetaminophen (TYLENOL) tablet 650 mg (650 mg Oral Given 10/21/19 2138)  lidocaine (XYLOCAINE) 2 % viscous mouth solution 15 mL (15 mLs Mouth/Throat Given 10/21/19 2222)  dexamethasone (DECADRON) injection 10 mg (10 mg Intramuscular Given 10/21/19 2255)  ketorolac (TORADOL) 30 MG/ML injection 30 mg (30 mg Intramuscular Given 10/21/19 2255)    ED Course  I have reviewed the triage vital signs and the nursing notes.  Pertinent labs & imaging results that were available during my care of the patient were reviewed by me and considered in my  medical decision making (see chart for details).    MDM Rules/Calculators/A&P                     29 year old female presents to the ED due to fever and sore throat x 2 days. Patient was seen in the ED early this morning for the same complaint and tested for COVID which was negative. Patient notes she is concerned she may have strep throat. Upon arrival, patient febrile at 101 with tachycardia at 117 which appears similar to this morning's HR. Otherwise normal vitals. Suspect patient is tachycardic due to fever. Physical exam reassuring. Posterior oropharynx clear and mucous membranes moist, there is mild erythema with 1+ tonsillar hypertrophy and exudate on right tonsil, uvula midline, mild muffled voice, no trismus, tolerating secretions without difficulty. No abscess appreciated. Will obtain strep test and GC/Chlamydia. Toradol, decadron, and viscous lidocaine given to patient.  Strep test negative. GC/chlamydia pending. Suspect sore throat related to viral pharyngitis vs. GC/chlamydia infection. Upon reassessment, patient notes her sore throat has drastically improved after the pain medication. Voice appears to have improved. No antibiotics indicated at this point. Discharged with symptomatic tx for pain  Pt does not appear dehydrated, but did discuss importance of water rehydration. Presentation non concerning for PTA or RPA. No trismus or uvula deviation. Specific return precautions discussed. Pt able to drink water in ED without difficulty with intact air way. Recommended PCP follow up within the next week if symptoms do not improve. Strict ED precautions discussed with patient. Patient states understanding and agrees to plan. Patient discharged home in no acute distress and stable vitals  Final Clinical Impression(s) / ED Diagnoses Final diagnoses:  Viral pharyngitis    Rx / DC Orders ED Discharge Orders         Ordered    lidocaine (XYLOCAINE) 2 % solution  As needed     10/21/19 2325             Karie Kirks 10/22/19 0118    Tegeler, Gwenyth Allegra, MD 10/22/19 1630

## 2019-10-21 NOTE — Discharge Instructions (Addendum)
Person Under Monitoring Name: Latoya [name]  Location: [place] [address]   Infection Prevention Recommendations for Individuals Confirmed to have, or Being Evaluated for, 2019 Novel Coronavirus (COVID-19) Infection Who Receive Care at Home  Individuals who are confirmed to have, or are being evaluated for, COVID-19 should follow the prevention steps below until a healthcare provider or local or state health department says they can return to normal activities.  Stay home except to get medical care You should restrict activities outside your home, except for getting medical care. Do not go to work, school, or public areas, and do not use public transportation or taxis.  Call ahead before visiting your doctor Before your medical appointment, call the healthcare provider and tell them that you have, or are being evaluated for, COVID-19 infection. This will help the healthcare provider's office take steps to keep other people from getting infected. Ask your healthcare provider to call the local or state health department.  Monitor your symptoms Seek prompt medical attention if your illness is worsening (e.g., difficulty breathing). Before going to your medical appointment, call the healthcare provider and tell them that you have, or are being evaluated for, COVID-19 infection. Ask your healthcare provider to call the local or state health department.  Wear a facemask You should wear a facemask that covers your nose and mouth when you are in the same room with other people and when you visit a healthcare provider. People who live with or visit you should also wear a facemask while they are in the same room with you.  Separate yourself from other people in your home As much as possible, you should stay in a different room from other people in your home. Also, you should use a separate bathroom, if available.  Avoid sharing household items You should not  share dishes, drinking glasses, cups, eating utensils, towels, bedding, or other items with other people in your home. After using these items, you should wash them thoroughly with soap and water.  Cover your coughs and sneezes Cover your mouth and nose with a tissue when you cough or sneeze, or you can cough or sneeze into your sleeve. Throw used tissues in a lined trash can, and immediately wash your hands with soap and water for at least 20 seconds or use an alcohol-based hand rub.  Wash your [facility] your hands often and thoroughly with soap and water for at least 20 seconds. You can use an alcohol-based hand sanitizer if soap and water are not available and if your hands are not visibly dirty. Avoid touching your eyes, nose, and mouth with unwashed hands.   Prevention Steps for Caregivers and Household Members of Individuals Confirmed to have, or Being Evaluated for, COVID-19 Infection Being Cared for in the Home  If you live with, or provide care at home for, a person confirmed to have, or being evaluated for, COVID-19 infection please follow these guidelines to prevent infection:  Follow healthcare provider's instructions Make sure that you understand and can help the patient follow any healthcare provider instructions for all care.  Provide for the patient's basic needs You should help the patient with basic needs in the home and provide support for getting groceries, prescriptions, and other personal needs.  Monitor the patient's symptoms If they are getting sicker, call his or her medical provider and tell them that the patient has, or is being evaluated for, COVID-19 infection. This will help the healthcare provider's  office take steps to keep other people from getting infected. Ask the healthcare provider to call the local or state health department.  Limit the number of people who have contact with the patient If possible, have only one caregiver for the  patient. Other household members should stay in another home or place of residence. If this is not possible, they should stay in another room, or be separated from the patient as much as possible. Use a separate bathroom, if available. Restrict visitors who do not have an essential need to be in the home.  Keep older adults, very young children, and other sick people away from the patient Keep older adults, very young children, and those who have compromised immune systems or chronic health conditions away from the patient. This includes people with chronic heart, lung, or kidney conditions, diabetes, and cancer.  Ensure good ventilation Make sure that shared spaces in the home have good air flow, such as from an air conditioner or an opened window, weather permitting.  Wash your hands often Wash your hands often and thoroughly with soap and water for at least 20 seconds. You can use an alcohol based hand sanitizer if soap and water are not available and if your hands are not visibly dirty. Avoid touching your eyes, nose, and mouth with unwashed hands. Use disposable paper towels to dry your hands. If not available, use dedicated cloth towels and replace them when they become wet.  Wear a facemask and gloves Wear a disposable facemask at all times in the room and gloves when you touch or have contact with the patient's blood, body fluids, and/or secretions or excretions, such as sweat, saliva, sputum, nasal mucus, vomit, urine, or feces.  Ensure the mask fits over your nose and mouth tightly, and do not touch it during use. Throw out disposable facemasks and gloves after using them. Do not reuse. Wash your hands immediately after removing your facemask and gloves. If your personal clothing becomes contaminated, carefully remove clothing and launder. Wash your hands after handling contaminated clothing. Place all used disposable facemasks, gloves, and other waste in a lined container before  disposing them with other household waste. Remove gloves and wash your hands immediately after handling these items.  Do not share dishes, glasses, or other household items with the patient Avoid sharing household items. You should not share dishes, drinking glasses, cups, eating utensils, towels, bedding, or other items with a patient who is confirmed to have, or being evaluated for, COVID-19 infection. After the person uses these items, you should wash them thoroughly with soap and water.  Wash laundry thoroughly Immediately remove and wash clothes or bedding that have blood, body fluids, and/or secretions or excretions, such as sweat, saliva, sputum, nasal mucus, vomit, urine, or feces, on them. Wear gloves when handling laundry from the patient. Read and follow directions on labels of laundry or clothing items and detergent. In general, wash and dry with the warmest temperatures recommended on the label.  Clean all areas the individual has used often Clean all touchable surfaces, such as counters, tabletops, doorknobs, bathroom fixtures, toilets, phones, keyboards, tablets, and bedside tables, every day. Also, clean any surfaces that may have blood, body fluids, and/or secretions or excretions on them. Wear gloves when cleaning surfaces the patient has come in contact with. Use a diluted bleach solution (e.g., dilute bleach with 1 part bleach and 10 parts water) or a household disinfectant with a label that says EPA-registered for coronaviruses. To make a  bleach solution at home, add 1 tablespoon of bleach to 1 quart (4 cups) of water. For a larger supply, add  cup of bleach to 1 gallon (16 cups) of water. Read labels of cleaning products and follow recommendations provided on product labels. Labels contain instructions for safe and effective use of the cleaning product including precautions you should take when applying the product, such as wearing gloves or eye protection and making sure you  have good ventilation during use of the product. Remove gloves and wash hands immediately after cleaning.  Monitor yourself for signs and symptoms of illness Caregivers and household members are considered close contacts, should monitor their health, and will be asked to limit movement outside of the home to the extent possible. Follow the monitoring steps for close contacts listed on the symptom monitoring form.   ? If you have additional questions, contact your local health department or call the epidemiologist on call at [phone] (available 24/7). ? This guidance is subject to change. For the most up-to-date guidance from [facility], please refer to their website: [email or web address]

## 2019-10-21 NOTE — ED Provider Notes (Signed)
[facility] DEPT Provider Note   CSN: [id number] Arrival date & time: 10/21/19  0105     History Chief Complaint  Patient presents with  . Generalized Body Aches    Covid-19 exposure    Latoya [name] is a 29 y.o. female.  The history is provided by the patient.  URI Presenting symptoms: congestion, fever and sore throat   Presenting symptoms: no cough   Severity:  Moderate Onset quality:  Gradual Duration:  2 days Timing:  Constant Progression:  Unchanged Chronicity:  New Relieved by:  Nothing Worsened by:  Nothing Ineffective treatments:  None tried Associated symptoms: myalgias   Associated symptoms: no arthralgias   Risk factors: not elderly   Patient with IBD and h/o drug seeking behavior presents with covid symptoms after spending the weekend with a family member who was known covid positive indoors without a mask.       Past Medical History:  Diagnosis Date  . Anemia   . Anxiety   . Arthritis   . Blood transfusion without reported diagnosis   . Crohn's disease ([place])   . Depression   . Drug-seeking behavior   . Gestational diabetes   . History of substance abuse ([place])   . HSV infection   . Pregnancy induced hypertension   . Substance abuse [facility])     Patient Active Problem List   Diagnosis Date Noted  . Post partum depression 07/12/2017  . IBD (inflammatory bowel disease)   . Acute pancreatitis   . Iron deficiency anemia due to chronic blood loss   . Heme positive stool   . Pancolitis ([place]) 07/04/2017  . Generalized abdominal pain   . Diarrhea   . Major depressive disorder, recurrent severe without psychotic features ([place]) 06/29/2017  . Cocaine abuse ([place]) 06/29/2017  . SVD (spontaneous vaginal delivery) 05/15/2017  . Crack cocaine use 05/15/2017  . Cervical dysplasia 01/16/2017  . Obesity (BMI 30.0-34.9) 01/12/2017  . History of substance abuse ([place]) 01/12/2017  . History of gestational diabetes mellitus  (GDM) 01/12/2017  . History of gestational hypertension 01/12/2017  . History of macrosomia in infant in prior pregnancy, currently pregnant 01/12/2017  . Short interval between pregnancies affecting pregnancy in second trimester, antepartum 01/12/2017  . Late prenatal care 01/12/2017  . Paxil use in early pregnancy 01/12/2017  . History of suicide attempt 01/12/2017  . Anxiety and depression 01/12/2017  . Supervision of high risk pregnancy, antepartum 01/10/2017  . GDM (gestational diabetes mellitus) 02/25/2016  . Obesity in pregnancy 02/25/2016    Past Surgical History:  Procedure Laterality Date  . CHOLECYSTECTOMY    . COLONOSCOPY N/A 07/07/2017   Procedure: COLONOSCOPY;  Surgeon: Juanita Craver, MD;  Location: [facility] ENDOSCOPY;  Service: Endoscopy;  Laterality: N/A;  . DEEP NECK LYMPH NODE BIOPSY / EXCISION  2011     OB History    Gravida  2   Para  2   Term  2   Preterm  0   AB  0   Living  2     SAB  0   TAB  0   Ectopic  0   Multiple  0   Live Births  2           Family History  Problem Relation Age of Onset  . Diabetes Mother   . Hypertension Mother   . Hypertension Father   . Stroke Maternal Grandfather   . Colon cancer Neg Hx   . Stomach cancer  Neg Hx     Social History   Tobacco Use  . Smoking status: Never Smoker  . Smokeless tobacco: Never Used  Substance Use Topics  . Alcohol use: No    Comment: "not for a while"  . Drug use: Yes    Types: "Crack" cocaine, Cocaine    Comment: cocai, former user, 06/27/2017    Home Medications Prior to Admission medications   Medication Sig Start Date End Date Taking? Authorizing Provider  hyoscyamine (LEVSIN SL) 0.125 MG SL tablet Place 1 tablet (0.125 mg total) every 4 (four) hours as needed under the tongue. Patient not taking: Reported on 07/03/2018 08/03/17 07/09/19  Levin Erp, PA  mesalamine (APRISO) 0.375 g 24 hr capsule Take 4 capsules (1.5 g total) by mouth daily. Patient not  taking: Reported on 07/03/2018 11/26/17 07/09/19  Mauri Pole, MD  omeprazole (PRILOSEC) 40 MG capsule TAKE 1 CAPSULE (40 MG TOTAL) DAILY BY MOUTH. Patient not taking: Reported on 07/03/2018 11/19/17 07/09/19  Mauri Pole, MD  PARoxetine (PAXIL-CR) 25 MG 24 hr tablet Take 1 tablet (25 mg total) by mouth daily. Patient not taking: Reported on 07/03/2018 05/16/17 07/09/19  Emily Filbert, MD    Allergies    Patient has no known allergies.  Review of Systems   Review of Systems  Constitutional: Positive for fever.  HENT: Positive for congestion and sore throat.   Eyes: Negative for visual disturbance.  Respiratory: Negative for cough and shortness of breath.   Cardiovascular: Negative for chest pain.  Gastrointestinal: Negative for abdominal pain.  Genitourinary: Negative for difficulty urinating.  Musculoskeletal: Positive for myalgias. Negative for arthralgias.  Neurological: Negative for dizziness.  Psychiatric/Behavioral: Negative for agitation.  All other systems reviewed and are negative.   Physical Exam Updated Vital Signs BP 133/80 (BP Location: Right Arm)   Pulse (!) 116   Temp 99.2 F (37.3 C) (Oral)   Resp 20   SpO2 98%   Physical Exam Vitals and nursing note reviewed.  Constitutional:      General: She is not in acute distress.    Appearance: Normal appearance.  HENT:     Head: Normocephalic and atraumatic.     Nose: Nose normal.  Eyes:     Conjunctiva/sclera: Conjunctivae normal.     Pupils: Pupils are equal, round, and reactive to light.  Cardiovascular:     Rate and Rhythm: Normal rate and regular rhythm.     Pulses: Normal pulses.     Heart sounds: Normal heart sounds.  Pulmonary:     Effort: Pulmonary effort is normal.     Breath sounds: Normal breath sounds.  Abdominal:     General: Abdomen is flat. Bowel sounds are normal.     Tenderness: There is no abdominal tenderness. There is no guarding.  Musculoskeletal:        General: Normal  range of motion.     Cervical back: Normal range of motion and neck supple.  Skin:    General: Skin is warm and dry.     Capillary Refill: Capillary refill takes less than 2 seconds.  Neurological:     General: No focal deficit present.     Mental Status: She is alert and oriented to person, place, and time.     Deep Tendon Reflexes: Reflexes normal.  Psychiatric:        Mood and Affect: Mood normal.        Behavior: Behavior normal.     ED Results /  Procedures / Treatments   Labs (all labs ordered are listed, but only abnormal results are displayed) Labs Reviewed  SARS CORONAVIRUS 2 (TAT 6-24 HRS)    EKG None  Radiology DG Chest Portable 1 View  Result Date: 10/21/2019 CLINICAL DATA:  COVID.  Shortness of breath, fever EXAM: PORTABLE CHEST 1 VIEW COMPARISON:  06/30/2019 FINDINGS: The heart size and mediastinal contours are within normal limits. Both lungs are clear. The visualized skeletal structures are unremarkable. IMPRESSION: Negative. Electronically Signed   By: Rolm Baptise M.D.   On: 10/21/2019 01:39    Procedures Procedures (including critical care time)  Medications Ordered in ED Medications  acetaminophen (TYLENOL) tablet 1,000 mg (1,000 mg Oral Given 10/21/19 0129)    ED Course  I have reviewed the triage vital signs and the nursing notes.  Pertinent labs & imaging results that were available during my care of the patient were reviewed by me and considered in my medical decision making (see chart for details).  Take tylenol, vitamin C, D, zinc.  Stay in your home.  You are not to go out unless seeking medical care for shortness of breath or weakness.   Latoya [name] was evaluated in Emergency Department on 10/21/2019 for the symptoms described in the history of present illness. She was evaluated in the context of the global COVID-19 pandemic, which necessitated consideration that the patient might be at risk for infection with the SARS-CoV-2 virus that  causes COVID-19. Institutional protocols and algorithms that pertain to the evaluation of patients at risk for COVID-19 are in a state of rapid change based on information released by regulatory bodies including the CDC and federal and state organizations. These policies and algorithms were followed during the patient's care in the ED.  Final Clinical Impression(s) / ED Diagnoses Final diagnoses:  [id number]    Return for weakness, numbness, changes in vision or speech, fevers >100.4 unrelieved by medication, shortness of breath, intractable vomiting, or diarrhea, abdominal pain, Inability to tolerate liquids or food, cough, altered mental status or any concerns. No signs of systemic illness or infection. The patient is nontoxic-appearing on exam and vital signs are within normal limits.   I have reviewed the triage vital signs and the nursing notes. Pertinent labs &imaging results that were available during my care of the patient were reviewed by me and considered in my medical decision making (see chart for details).  After history, exam, and medical workup I feel the patient has been appropriately medically screened and is safe for discharge home. Pertinent diagnoses were discussed with the patient. Patient was given return precautions   Phaedra Colgate, MD 10/21/19 0236

## 2020-05-23 ENCOUNTER — Emergency Department (HOSPITAL_COMMUNITY)
Admission: EM | Admit: 2020-05-23 | Discharge: 2020-05-23 | Disposition: A | Discharge status: Home / Self Care | Payer: Medicaid Other | Attending: Emergency Medicine | Admitting: Emergency Medicine

## 2020-05-23 ENCOUNTER — Other Ambulatory Visit: Payer: Self-pay

## 2020-05-23 DIAGNOSIS — T50901A Poisoning by unspecified drugs, medicaments and biological substances, accidental (unintentional), initial encounter: Secondary | ICD-10-CM | POA: Insufficient documentation

## 2020-05-23 DIAGNOSIS — Z5321 Procedure and treatment not carried out due to patient leaving prior to being seen by health care provider: Secondary | ICD-10-CM | POA: Diagnosis not present

## 2020-05-23 NOTE — ED Triage Notes (Signed)
Arrived by EMS from a friend's house. EMS reports patient was apneic on arrival. EMS reports patient received 28m Narcan IN and 271mNarcan IV. Patient now alert and oriented.

## 2020-05-23 NOTE — ED Triage Notes (Signed)
Patient pulled out IV and left facility

## 2020-06-11 ENCOUNTER — Other Ambulatory Visit: Payer: Self-pay

## 2020-06-11 ENCOUNTER — Emergency Department (HOSPITAL_COMMUNITY)
Admission: EM | Admit: 2020-06-11 | Discharge: 2020-06-11 | Disposition: A | Discharge status: Home / Self Care | Payer: Medicaid Other | Attending: Emergency Medicine | Admitting: Emergency Medicine

## 2020-06-11 ENCOUNTER — Encounter (HOSPITAL_COMMUNITY): Payer: Self-pay | Admitting: Emergency Medicine

## 2020-06-11 DIAGNOSIS — R258 Other abnormal involuntary movements: Secondary | ICD-10-CM | POA: Insufficient documentation

## 2020-06-11 DIAGNOSIS — F1910 Other psychoactive substance abuse, uncomplicated: Secondary | ICD-10-CM | POA: Diagnosis not present

## 2020-06-11 DIAGNOSIS — Z046 Encounter for general psychiatric examination, requested by authority: Secondary | ICD-10-CM

## 2020-06-11 DIAGNOSIS — R45851 Suicidal ideations: Secondary | ICD-10-CM | POA: Diagnosis not present

## 2020-06-11 DIAGNOSIS — Z79899 Other long term (current) drug therapy: Secondary | ICD-10-CM | POA: Insufficient documentation

## 2020-06-11 NOTE — ED Notes (Signed)
This patient can not be found within emergency department. Patient has left w/o notifying emergency department staff. Patient was stable upon last assessment.

## 2020-06-11 NOTE — ED Provider Notes (Signed)
[facility] DEPT Provider Note   CSN: [id number] Arrival date & time: 06/11/20  1129     History Chief Complaint  Patient presents with  . wants placement    Latoya [name] is a 29 y.o. female.  HPI 29 year old female with history of anemia, anxiety, Crohn's disease, depression, drug-seeking behavior, history of substance abuse presents to the ER with request for rehab placement.  Patient states that she was recently discharged from old [facility] and was supposed to be admitted at [facility] for rehab today.  States that she was rejected from them due to her having private insurance and Medicaid.  She is here requesting placement for rehab center, as well as endorsing fear of overdosing on her medications.  She will not answer directly if she has thoughts of hurting herself, but states that she will sometimes have thoughts of overdosing on her Seroquel and going to sleep.  She denies any voices or hallucinations.  Denies any recent use of the Seroquel, states she has not used it since she filled her prescription.  She also states she has a prescription in her back for BuSpar.  Denies any recent drug or alcohol use.  Denies any withdrawal symptoms.    Past Medical History:  Diagnosis Date  . Anemia   . Anxiety   . Arthritis   . Blood transfusion without reported diagnosis   . Crohn's disease ([place])   . Depression   . Drug-seeking behavior   . Gestational diabetes   . History of substance abuse ([place])   . HSV infection   . Pregnancy induced hypertension   . Substance abuse [facility])     Patient Active Problem List   Diagnosis Date Noted  . Post partum depression 07/12/2017  . IBD (inflammatory bowel disease)   . Acute pancreatitis   . Iron deficiency anemia due to chronic blood loss   . Heme positive stool   . Pancolitis ([place]) 07/04/2017  . Generalized abdominal pain   . Diarrhea   . Major depressive disorder, recurrent severe without psychotic  features ([place]) 06/29/2017  . Cocaine abuse ([place]) 06/29/2017  . SVD (spontaneous vaginal delivery) 05/15/2017  . Crack cocaine use 05/15/2017  . Cervical dysplasia 01/16/2017  . Obesity (BMI 30.0-34.9) 01/12/2017  . History of substance abuse ([place]) 01/12/2017  . History of gestational diabetes mellitus (GDM) 01/12/2017  . History of gestational hypertension 01/12/2017  . History of macrosomia in infant in prior pregnancy, currently pregnant 01/12/2017  . Short interval between pregnancies affecting pregnancy in second trimester, antepartum 01/12/2017  . Late prenatal care 01/12/2017  . Paxil use in early pregnancy 01/12/2017  . History of suicide attempt 01/12/2017  . Anxiety and depression 01/12/2017  . Supervision of high risk pregnancy, antepartum 01/10/2017  . GDM (gestational diabetes mellitus) 02/25/2016  . Obesity in pregnancy 02/25/2016    Past Surgical History:  Procedure Laterality Date  . CHOLECYSTECTOMY    . COLONOSCOPY N/A 07/07/2017   Procedure: COLONOSCOPY;  Surgeon: Juanita Craver, MD;  Location: [facility] ENDOSCOPY;  Service: Endoscopy;  Laterality: N/A;  . DEEP NECK LYMPH NODE BIOPSY / EXCISION  2011     OB History    Gravida  2   Para  2   Term  2   Preterm  0   AB  0   Living  2     SAB  0   TAB  0   Ectopic  0   Multiple  0   Live  Births  2           Family History  Problem Relation Age of Onset  . Diabetes Mother   . Hypertension Mother   . Hypertension Father   . Stroke Maternal Grandfather   . Colon cancer Neg Hx   . Stomach cancer Neg Hx     Social History   Tobacco Use  . Smoking status: Never Smoker  . Smokeless tobacco: Never Used  Vaping Use  . Vaping Use: Never used  Substance Use Topics  . Alcohol use: No    Comment: "not for a while"  . Drug use: Yes    Types: "Crack" cocaine, Cocaine    Comment: cocai, former user, 06/27/2017    Home Medications Prior to Admission medications   Medication Sig Start Date End Date  Taking? Authorizing Provider  lidocaine (XYLOCAINE) 2 % solution Use as directed 15 mLs in the mouth or throat as needed for mouth pain. 10/21/19   Suzy Bouchard, PA-C  hyoscyamine (LEVSIN SL) 0.125 MG SL tablet Place 1 tablet (0.125 mg total) every 4 (four) hours as needed under the tongue. Patient not taking: Reported on 07/03/2018 08/03/17 07/09/19  Levin Erp, PA  mesalamine (APRISO) 0.375 g 24 hr capsule Take 4 capsules (1.5 g total) by mouth daily. Patient not taking: Reported on 07/03/2018 11/26/17 07/09/19  Mauri Pole, MD  omeprazole (PRILOSEC) 40 MG capsule TAKE 1 CAPSULE (40 MG TOTAL) DAILY BY MOUTH. Patient not taking: Reported on 07/03/2018 11/19/17 07/09/19  Mauri Pole, MD  PARoxetine (PAXIL-CR) 25 MG 24 hr tablet Take 1 tablet (25 mg total) by mouth daily. Patient not taking: Reported on 07/03/2018 05/16/17 07/09/19  Emily Filbert, MD    Allergies    Patient has no known allergies.  Review of Systems   Review of Systems  Constitutional: Negative for chills and fever.  Respiratory: Negative for shortness of breath.   Cardiovascular: Negative for chest pain.  Psychiatric/Behavioral: Positive for self-injury and suicidal ideas. Negative for confusion and hallucinations. The patient is not hyperactive.     Physical Exam Updated Vital Signs BP (!) 129/97   Pulse 94   Temp 98 F (36.7 C) (Oral)   Resp 18   Ht 5' 10"  (1.778 m)   Wt 78 kg   SpO2 96%   BMI 24.68 kg/m   Physical Exam Vitals reviewed.  Constitutional:      General: She is not in acute distress.    Appearance: Normal appearance. She is not ill-appearing, toxic-appearing or diaphoretic.  HENT:     Head: Normocephalic and atraumatic.  Eyes:     General:        Right eye: No discharge.        Left eye: No discharge.     Extraocular Movements: Extraocular movements intact.     Conjunctiva/sclera: Conjunctivae normal.  Cardiovascular:     Pulses: Normal pulses.     Heart  sounds: Normal heart sounds.  Pulmonary:     Effort: Pulmonary effort is normal.     Breath sounds: Normal breath sounds.  Abdominal:     General: Abdomen is flat.     Tenderness: There is no abdominal tenderness.  Musculoskeletal:        General: No swelling. Normal range of motion.  Neurological:     General: No focal deficit present.     Mental Status: She is alert and oriented to person, place, and time.  Psychiatric:  Mood and Affect: Mood normal.        Behavior: Behavior normal.     ED Results / Procedures / Treatments   Labs (all labs ordered are listed, but only abnormal results are displayed) Labs Reviewed - No data to display  EKG None  Radiology No results found.  Procedures Procedures (including critical care time)  Medications Ordered in ED Medications - No data to display  ED Course  I have reviewed the triage vital signs and the nursing notes.  Pertinent labs & imaging results that were available during my care of the patient were reviewed by me and considered in my medical decision making (see chart for details).    MDM Rules/Calculators/A&P                         29 year old female requesting placement, and endorsing suicidal thoughts by overdosing on her Seroquel and BuSpar.  She is overall well-appearing, vital stable, no evidence of withdrawal.  I spoke with Darnelle Maffucci with [facility] who states that peers support manager is out for the week, however, disposition will handle her peers support consult.  I also consulted TTS, Darnelle Maffucci states that the behavioral health urgent care is full, but will try to discharge some patients and make space for her.  He will contact me if there is any openings.  In the meantime, will consult TTS.  Patient may have to be IVC as she does have direct access to her medications and does endorse suicidal thoughts.  Patient eloped from the emergency department without letting staff know.  Given that the patient endorsed suicidal  thoughts by using Seroquel, and has ready access to this medication, it is my medical opinion that she poses a threat to herself and also has a history of substance abuse.  IVC paperwork filled out by Dr. Tyrone Nine, secretary was informed to inform [employer].  [employer] and Jalene Mullet who were following her care and made aware.  Informed Tacy Dura of the case in case she returns with [employer].    Final Clinical Impression(s) / ED Diagnoses Final diagnoses:  Substance abuse ([place])  Suicidal ideations  Involuntary commitment    Rx / DC Orders ED Discharge Orders    None       Garald Balding, PA-C 06/11/20 [place], DO 06/13/20 2305

## 2020-06-11 NOTE — ED Triage Notes (Signed)
Per pt, states she was recently at [facility] for 18 days-was discharged and suppose to be admitted to [facility] stated she had a private insurance plus medicaid and they could not admit her-states she has a history of using heroin and crack-states she doesn't want to relapse or misuse her anxiety and psych meds

## 2020-06-12 ENCOUNTER — Emergency Department (HOSPITAL_COMMUNITY)
Admission: EM | Admit: 2020-06-12 | Discharge: 2020-06-12 | Disposition: A | Discharge status: Home / Self Care | Payer: Medicaid Other | Attending: Emergency Medicine | Admitting: Emergency Medicine

## 2020-06-12 ENCOUNTER — Encounter (HOSPITAL_COMMUNITY): Payer: Self-pay

## 2020-06-12 DIAGNOSIS — R456 Violent behavior: Secondary | ICD-10-CM | POA: Diagnosis present

## 2020-06-12 DIAGNOSIS — F1490 Cocaine use, unspecified, uncomplicated: Secondary | ICD-10-CM | POA: Insufficient documentation

## 2020-06-12 DIAGNOSIS — Z046 Encounter for general psychiatric examination, requested by authority: Secondary | ICD-10-CM

## 2020-06-12 DIAGNOSIS — F1190 Opioid use, unspecified, uncomplicated: Secondary | ICD-10-CM | POA: Insufficient documentation

## 2020-06-12 DIAGNOSIS — F4310 Post-traumatic stress disorder, unspecified: Secondary | ICD-10-CM | POA: Diagnosis not present

## 2020-06-12 DIAGNOSIS — R258 Other abnormal involuntary movements: Secondary | ICD-10-CM | POA: Diagnosis not present

## 2020-06-12 DIAGNOSIS — I10 Essential (primary) hypertension: Secondary | ICD-10-CM | POA: Diagnosis not present

## 2020-06-12 DIAGNOSIS — F99 Mental disorder, not otherwise specified: Secondary | ICD-10-CM | POA: Insufficient documentation

## 2020-06-12 DIAGNOSIS — R000 Tachycardia, unspecified: Secondary | ICD-10-CM | POA: Diagnosis not present

## 2020-06-12 DIAGNOSIS — R451 Restlessness and agitation: Secondary | ICD-10-CM | POA: Diagnosis not present

## 2020-06-12 DIAGNOSIS — R454 Irritability and anger: Secondary | ICD-10-CM | POA: Insufficient documentation

## 2020-06-12 DIAGNOSIS — F332 Major depressive disorder, recurrent severe without psychotic features: Secondary | ICD-10-CM | POA: Diagnosis not present

## 2020-06-12 LAB — CBC WITH DIFFERENTIAL/PLATELET
Abs Immature Granulocytes: 0.07 10*3/uL (ref 0.00–0.07)
Basophils Absolute: 0 10*3/uL (ref 0.0–0.1)
Basophils Relative: 0 %
Eosinophils Absolute: 0 10*3/uL (ref 0.0–0.5)
Eosinophils Relative: 0 %
HCT: 31.9 % — ABNORMAL LOW (ref 36.0–46.0)
Hemoglobin: 10.5 g/dL — ABNORMAL LOW (ref 12.0–15.0)
Immature Granulocytes: 1 %
Lymphocytes Relative: 32 %
Lymphs Abs: 2.9 10*3/uL (ref 0.7–4.0)
MCH: 27.9 pg (ref 26.0–34.0)
MCHC: 32.9 g/dL (ref 30.0–36.0)
MCV: 84.8 fL (ref 80.0–100.0)
Monocytes Absolute: 0.6 10*3/uL (ref 0.1–1.0)
Monocytes Relative: 7 %
Neutro Abs: 5.3 10*3/uL (ref 1.7–7.7)
Neutrophils Relative %: 60 %
Platelets: 348 10*3/uL (ref 150–400)
RBC: 3.76 MIL/uL — ABNORMAL LOW (ref 3.87–5.11)
RDW: 13.3 % (ref 11.5–15.5)
WBC: 8.9 10*3/uL (ref 4.0–10.5)
nRBC: 0 % (ref 0.0–0.2)

## 2020-06-12 LAB — COMPREHENSIVE METABOLIC PANEL
ALT: 29 U/L (ref 0–44)
AST: 19 U/L (ref 15–41)
Albumin: 4.1 g/dL (ref 3.5–5.0)
Alkaline Phosphatase: 54 U/L (ref 38–126)
Anion gap: 10 (ref 5–15)
BUN: 12 mg/dL (ref 6–20)
CO2: 21 mmol/L — ABNORMAL LOW (ref 22–32)
Calcium: 9.4 mg/dL (ref 8.9–10.3)
Chloride: 108 mmol/L (ref 98–111)
Creatinine, Ser: 0.63 mg/dL (ref 0.44–1.00)
GFR calc Af Amer: >60 mL/min (ref >60)
GFR calc non Af Amer: >60 mL/min (ref >60)
Glucose, Bld: 101 mg/dL — ABNORMAL HIGH (ref 70–99)
Potassium: 3.5 mmol/L (ref 3.5–5.1)
Sodium: 139 mmol/L (ref 135–145)
Total Bilirubin: 0.3 mg/dL (ref 0.3–1.2)
Total Protein: 7.7 g/dL (ref 6.5–8.1)

## 2020-06-12 LAB — RAPID URINE DRUG SCREEN, HOSP PERFORMED
Amphetamines: NOT DETECTED
Barbiturates: NOT DETECTED
Benzodiazepines: POSITIVE — AB
Cocaine: POSITIVE — AB
Opiates: NOT DETECTED
Tetrahydrocannabinol: NOT DETECTED

## 2020-06-12 LAB — HCG, QUANTITATIVE, PREGNANCY: hCG, Beta Chain, Quant, S: <1 m[IU]/mL (ref <5)

## 2020-06-12 MED ORDER — LORAZEPAM 1 MG PO TABS
0.0000 mg | ORAL_TABLET | Freq: Four times a day (QID) | ORAL | Status: DC
  Administered 2020-06-12: 1 mg via ORAL
  Filled 2020-06-12: qty 1

## 2020-06-12 MED ORDER — THIAMINE HCL 100 MG PO TABS
100.0000 mg | ORAL_TABLET | Freq: Every day | ORAL | Status: DC
  Administered 2020-06-12: 100 mg via ORAL
  Filled 2020-06-12: qty 1

## 2020-06-12 MED ORDER — LORAZEPAM 2 MG/ML IJ SOLN: 0.0000 mg | Freq: Two times a day (BID) | INTRAMUSCULAR | Status: DC

## 2020-06-12 MED ORDER — THIAMINE HCL 100 MG/ML IJ SOLN: 100.0000 mg | Freq: Every day | INTRAMUSCULAR | Status: DC

## 2020-06-12 MED ORDER — LORAZEPAM 2 MG/ML IJ SOLN: 0.0000 mg | Freq: Four times a day (QID) | INTRAMUSCULAR | Status: DC

## 2020-06-12 MED ORDER — LORAZEPAM 1 MG PO TABS: 0.0000 mg | ORAL_TABLET | Freq: Two times a day (BID) | ORAL | Status: DC

## 2020-06-12 NOTE — Patient Outreach (Signed)
[facility] was able to get her cloths from her mother along with her medications. [facility] contacted transportation for transport an are waiting to assist Pt to vehicle.

## 2020-06-12 NOTE — ED Notes (Signed)
Patient is resting comfortably. 

## 2020-06-12 NOTE — ED Provider Notes (Signed)
Patient is a 29 year old female whose care was transferred to me at shift change by [place] PA-C.  Her HPI is below:  Latoya [name] is a 29 y.o. female with a hx of Crohn's disease, depression, drug-seeking behavior, history of substance abuse presents to the Emergency Department with [employer] under IVC.  Patient refuses to answer any questions.  Level 5 Caveat for condition of patient.  Per [employer] when they served the IVC papers, pt became aggressive, kicking and screaming.  Records reviewed: Patient evaluated in the emergency department earlier this afternoon around 2 PM requesting drug rehab.  Notes indicated patient was recently discharged from old [facility].  During that visit patient indicated that she was suicidal with a plan to overdose on Seroquel.  Patient does have access to this medication.    Per the record, patient eloped from the emergency department.  Given her suicidal ideation and access to medications with a plan IVC was taken out.  The history is provided by the patient, medical records and the police. The history is limited by the condition of the patient. No language interpreter was used.   Physical Exam  BP 119/80   Pulse 88   Resp 14   SpO2 100%   Physical Exam Vitals and nursing note reviewed.  Constitutional:      General: She is not in acute distress.    Appearance: She is well-developed.  HENT:     Head: Normocephalic and atraumatic.     Right Ear: External ear normal.     Left Ear: External ear normal.  Eyes:     General: No scleral icterus.       Right eye: No discharge.        Left eye: No discharge.     Conjunctiva/sclera: Conjunctivae normal.  Neck:     Trachea: No tracheal deviation.  Cardiovascular:     Rate and Rhythm: Normal rate.  Pulmonary:     Effort: Pulmonary effort is normal. No respiratory distress.     Breath sounds: No stridor.  Abdominal:     General: There is no distension.  Musculoskeletal:        General: No  swelling or deformity.     Cervical back: Neck supple.  Skin:    General: Skin is warm and dry.     Findings: No rash.  Neurological:     Mental Status: She is alert.     Cranial Nerves: Cranial nerve deficit: no gross deficits.     Comments: Sleeping comfortably in bed.    ED Course/Procedures   Clinical Course as of Jun 13 1419  Sat Jun 12, 2020  0549 Patient with improved cooperation.  Heart rate has improved.  Restraints have been removed.   [HM]    Clinical Course User Index [HM] Muthersbaugh, Gwenlyn Perking    Procedures  MDM  Patient is a 29 year old female whose care was transferred to me at shift change.  She is a suicidal IVC patient.  Due to aggressive behavior she was given 20 of Geodon and is currently sleeping comfortably in bed.  Awaiting TTS consult.  She has already been placed in psych hold.  She has been medically cleared.  Note: Portions of this report may have been transcribed using voice recognition software. Every effort was made to ensure accuracy; however, inadvertent computerized transcription errors may be present.      Rayna Sexton, PA-C 06/12/20 [place], Gwenyth Allegra, MD 06/12/20 [phone]

## 2020-06-12 NOTE — BH Assessment (Signed)
This therapist attempted to complete pt's TTS assessment @0600 . Per Barry Brunner, RN she will contact TTS when pt awakes to participate in assessment.  Darcus Austin, MSW, LCSW-A Triage Specialist [phone]

## 2020-06-12 NOTE — ED Notes (Signed)
Peer support arranged transport for pt to the facility in [place]. Pt

## 2020-06-12 NOTE — ED Provider Notes (Addendum)
[facility] DEPT Provider Note   CSN: [id number] Arrival date & time: 06/12/20  0049     History No chief complaint on file.   Latoya [name] is a 29 y.o. female with a hx of Crohn's disease, depression, drug-seeking behavior, history of substance abuse presents to the Emergency Department with [employer] under IVC.  Patient refuses to answer any questions.  Level 5 Caveat for condition of patient.  Per [employer] when they served the IVC papers, pt became aggressive, kicking and screaming.  Records reviewed: Patient evaluated in the emergency department earlier this afternoon around 2 PM requesting drug rehab.  Notes indicated patient was recently discharged from old [facility].  During that visit patient indicated that she was suicidal with a plan to overdose on Seroquel.  Patient does have access to this medication.    Per the record, patient eloped from the emergency department.  Given her suicidal ideation and access to medications with a plan IVC was taken out.    The history is provided by the patient, medical records and the police. The history is limited by the condition of the patient. No language interpreter was used.       Past Medical History:  Diagnosis Date  . Anemia   . Anxiety   . Arthritis   . Blood transfusion without reported diagnosis   . Crohn's disease ([place])   . Depression   . Drug-seeking behavior   . Gestational diabetes   . History of substance abuse ([place])   . HSV infection   . Pregnancy induced hypertension   . Substance abuse [facility])     Patient Active Problem List   Diagnosis Date Noted  . Post partum depression 07/12/2017  . IBD (inflammatory bowel disease)   . Acute pancreatitis   . Iron deficiency anemia due to chronic blood loss   . Heme positive stool   . Pancolitis ([place]) 07/04/2017  . Generalized abdominal pain   . Diarrhea   . Major depressive disorder, recurrent severe without psychotic features ([place])  06/29/2017  . Cocaine abuse ([place]) 06/29/2017  . SVD (spontaneous vaginal delivery) 05/15/2017  . Crack cocaine use 05/15/2017  . Cervical dysplasia 01/16/2017  . Obesity (BMI 30.0-34.9) 01/12/2017  . History of substance abuse ([place]) 01/12/2017  . History of gestational diabetes mellitus (GDM) 01/12/2017  . History of gestational hypertension 01/12/2017  . History of macrosomia in infant in prior pregnancy, currently pregnant 01/12/2017  . Short interval between pregnancies affecting pregnancy in second trimester, antepartum 01/12/2017  . Late prenatal care 01/12/2017  . Paxil use in early pregnancy 01/12/2017  . History of suicide attempt 01/12/2017  . Anxiety and depression 01/12/2017  . Supervision of high risk pregnancy, antepartum 01/10/2017  . GDM (gestational diabetes mellitus) 02/25/2016  . Obesity in pregnancy 02/25/2016    Past Surgical History:  Procedure Laterality Date  . CHOLECYSTECTOMY    . COLONOSCOPY N/A 07/07/2017   Procedure: COLONOSCOPY;  Surgeon: Juanita Craver, MD;  Location: [facility] ENDOSCOPY;  Service: Endoscopy;  Laterality: N/A;  . DEEP NECK LYMPH NODE BIOPSY / EXCISION  2011     OB History    Gravida  2   Para  2   Term  2   Preterm  0   AB  0   Living  2     SAB  0   TAB  0   Ectopic  0   Multiple  0   Live Births  2  Family History  Problem Relation Age of Onset  . Diabetes Mother   . Hypertension Mother   . Hypertension Father   . Stroke Maternal Grandfather   . Colon cancer Neg Hx   . Stomach cancer Neg Hx     Social History   Tobacco Use  . Smoking status: Never Smoker  . Smokeless tobacco: Never Used  Vaping Use  . Vaping Use: Never used  Substance Use Topics  . Alcohol use: No    Comment: "not for a while"  . Drug use: Yes    Types: "Crack" cocaine, Cocaine    Comment: cocai, former user, 06/27/2017    Home Medications Prior to Admission medications   Medication Sig Start Date End Date Taking?  Authorizing Provider  busPIRone (BUSPAR) 15 MG tablet Take 15 mg by mouth 3 (three) times daily.   Yes Ricard Dillon, MD  FLUoxetine (PROZAC) 10 MG capsule Take 10 mg by mouth at bedtime.   Yes Ricard Dillon, MD  hydrOXYzine (VISTARIL) 50 MG capsule Take 50 mg by mouth 3 (three) times daily as needed for anxiety.   Yes [provider]  Oxcarbazepine (TRILEPTAL) 300 MG tablet Take 300 mg by mouth 2 (two) times daily.   Yes Ricard Dillon, MD  QUEtiapine (SEROQUEL) 400 MG tablet Take 400 mg by mouth at bedtime.  01/18/20  Yes Ricard Dillon, MD  topiramate (TOPAMAX) 25 MG tablet Take 25 mg by mouth 2 (two) times daily.   Yes [provider]  lidocaine (XYLOCAINE) 2 % solution Use as directed 15 mLs in the mouth or throat as needed for mouth pain. 10/21/19   Suzy Bouchard, PA-C  hyoscyamine (LEVSIN SL) 0.125 MG SL tablet Place 1 tablet (0.125 mg total) every 4 (four) hours as needed under the tongue. Patient not taking: Reported on 07/03/2018 08/03/17 07/09/19  Levin Erp, PA  mesalamine (APRISO) 0.375 g 24 hr capsule Take 4 capsules (1.5 g total) by mouth daily. Patient not taking: Reported on 07/03/2018 11/26/17 07/09/19  Mauri Pole, MD  omeprazole (PRILOSEC) 40 MG capsule TAKE 1 CAPSULE (40 MG TOTAL) DAILY BY MOUTH. Patient not taking: Reported on 07/03/2018 11/19/17 07/09/19  Mauri Pole, MD  PARoxetine (PAXIL-CR) 25 MG 24 hr tablet Take 1 tablet (25 mg total) by mouth daily. Patient not taking: Reported on 07/03/2018 05/16/17 07/09/19  Emily Filbert, MD    Allergies    Patient has no known allergies.  Review of Systems   Review of Systems  Unable to perform ROS: Psychiatric disorder    Physical Exam Updated Vital Signs BP 129/80 (BP Location: Left Arm)   Pulse (!) 105   Resp 20   SpO2 98%   Physical Exam Vitals and nursing note reviewed.  Constitutional:      General: She is not in acute distress.     Appearance: She is well-developed.     Comments: Patient screaming, cursing, kicking and hitting.  HENT:     Head: Normocephalic.  Eyes:     General: No scleral icterus.    Conjunctiva/sclera: Conjunctivae normal.  Cardiovascular:     Rate and Rhythm: Tachycardia present.  Pulmonary:     Effort: Pulmonary effort is normal.  Musculoskeletal:        General: Normal range of motion.     Cervical back: Normal range of motion.  Skin:    General: Skin is warm and dry.  Neurological:     General: No  focal deficit present.     Mental Status: She is alert.  Psychiatric:        Mood and Affect: Affect is angry.        Behavior: Behavior is agitated and aggressive.        Thought Content: Thought content includes suicidal ideation. Thought content does not include homicidal ideation. Thought content includes suicidal plan. Thought content does not include homicidal plan.     ED Results / Procedures / Treatments   Labs (all labs ordered are listed, but only abnormal results are displayed) Labs Reviewed  COMPREHENSIVE METABOLIC PANEL - Abnormal; Notable for the following components:      Result Value   CO2 21 (*)    Glucose, Bld 101 (*)    All other components within normal limits  RAPID URINE DRUG SCREEN, HOSP PERFORMED - Abnormal; Notable for the following components:   Cocaine POSITIVE (*)    Benzodiazepines POSITIVE (*)    All other components within normal limits  CBC WITH DIFFERENTIAL/PLATELET - Abnormal; Notable for the following components:   RBC 3.76 (*)    Hemoglobin 10.5 (*)    HCT 31.9 (*)    All other components within normal limits  HCG, QUANTITATIVE, PREGNANCY    EKG EKG Interpretation  Date/Time:  Saturday June 12 2020 02:34:28 EDT Ventricular Rate:  94 PR Interval:    QRS Duration: 92 QT Interval:  364 QTC Calculation: 456 R Axis:   74 Text Interpretation: Sinus rhythm ST elev, probable normal early repol pattern No significant change since last  tracing Confirmed by Ripley Fraise [phone]) on 06/12/2020 2:51:05 AM   Procedures .Critical Care Performed by: Abigail Butts, PA-C Authorized by: Abigail Butts, PA-C   Critical care provider statement:    Critical care time (minutes):  45   Critical care time was exclusive of:  Separately billable procedures and treating other patients and teaching time   Critical care was necessary to treat or prevent imminent or life-threatening deterioration of the following conditions:  Toxidrome   Critical care was time spent personally by me on the following activities:  Discussions with consultants, evaluation of patient's response to treatment, examination of patient, ordering and performing treatments and interventions, ordering and review of laboratory studies, ordering and review of radiographic studies, pulse oximetry, re-evaluation of patient's condition, obtaining history from patient or surrogate and review of old charts   I assumed direction of critical care for this patient from another provider in my specialty: no     (including critical care time)  Medications Ordered in ED Medications - No data to display  ED Course  I have reviewed the triage vital signs and the nursing notes.  Pertinent labs & imaging results that were available during my care of the patient were reviewed by me and considered in my medical decision making (see chart for details).  Clinical Course as of Jun 13 644  Sat Jun 12, 2020  0549 Patient with improved cooperation.  Heart rate has improved.  Restraints have been removed.   [HM]    Clinical Course User Index [HM] Teriana Danker, Gwenlyn Perking   MDM Rules/Calculators/A&P                           Presents under IVC.  With IVC and first exam paperwork completed by Dr. Tyrone Nine earlier today.  The lab work was obtained.  Patient arrives aggressive, screaming, hitting and kicking.  Restraints required for patient  and staff safety.  Geodon given  with improvement in behavior.  Labs pending.  Patient will need TTS evaluation.  3:52 AM Patient resting comfortably.  Labs reassuring.  Mild anemia noted but appears to be baseline for patient.  Home meds include BuSpar, Prozac, Vistaril, Trileptal, Seroquel and Topamax.  Will hold until patient has been evaluated by TTS.  Unclear if she is taking all of these or not.  BP 133/70   Pulse 94   Resp 16   SpO2 97%    Pt pending TTS evaluation.  At shift change care was transferred to [facility] who will follow pending studies, re-evaulate and determine disposition.      Final Clinical Impression(s) / ED Diagnoses Final diagnoses:  Involuntary commitment    Rx / DC Orders ED Discharge Orders    None       Yaretsi Humphres, Gwenlyn Perking 06/12/20 0354    Dorion Petillo, Jarrett [place], PA-C 06/12/20 0814    Romney Compean, Jarrett Soho, PA-C 06/13/20 0645    Ripley Fraise, MD 06/13/20 [phone]
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
## 2020-06-12 NOTE — ED Triage Notes (Signed)
Patient brought in by [employer] , patient is combative, yelling, screaming and using profanity while in handcuffs.

## 2020-07-05 ENCOUNTER — Emergency Department (HOSPITAL_COMMUNITY)
Admission: EM | Admit: 2020-07-05 | Discharge: 2020-07-06 | Disposition: A | Discharge status: Home / Self Care | Payer: Medicaid Other | Attending: Emergency Medicine | Admitting: Emergency Medicine

## 2020-07-05 ENCOUNTER — Encounter (HOSPITAL_COMMUNITY): Payer: Self-pay | Admitting: Emergency Medicine

## 2020-07-05 DIAGNOSIS — Z20822 Contact with and (suspected) exposure to covid-19: Secondary | ICD-10-CM | POA: Diagnosis not present

## 2020-07-05 DIAGNOSIS — R45851 Suicidal ideations: Secondary | ICD-10-CM

## 2020-07-05 DIAGNOSIS — F332 Major depressive disorder, recurrent severe without psychotic features: Secondary | ICD-10-CM | POA: Diagnosis not present

## 2020-07-05 DIAGNOSIS — T1491XA Suicide attempt, initial encounter: Secondary | ICD-10-CM | POA: Insufficient documentation

## 2020-07-05 DIAGNOSIS — T50902A Poisoning by unspecified drugs, medicaments and biological substances, intentional self-harm, initial encounter: Secondary | ICD-10-CM | POA: Diagnosis present

## 2020-07-05 DIAGNOSIS — X838XXA Intentional self-harm by other specified means, initial encounter: Secondary | ICD-10-CM | POA: Diagnosis not present

## 2020-07-05 DIAGNOSIS — F4310 Post-traumatic stress disorder, unspecified: Secondary | ICD-10-CM | POA: Insufficient documentation

## 2020-07-05 DIAGNOSIS — F1290 Cannabis use, unspecified, uncomplicated: Secondary | ICD-10-CM | POA: Diagnosis not present

## 2020-07-05 LAB — COMPREHENSIVE METABOLIC PANEL
ALT: 17 U/L (ref 0–44)
AST: 15 U/L (ref 15–41)
Albumin: 3.9 g/dL (ref 3.5–5.0)
Alkaline Phosphatase: 54 U/L (ref 38–126)
Anion gap: 9 (ref 5–15)
BUN: 8 mg/dL (ref 6–20)
CO2: 23 mmol/L (ref 22–32)
Calcium: 9.1 mg/dL (ref 8.9–10.3)
Chloride: 107 mmol/L (ref 98–111)
Creatinine, Ser: 0.62 mg/dL (ref 0.44–1.00)
GFR, Estimated: >60 mL/min (ref >60)
Glucose, Bld: 87 mg/dL (ref 70–99)
Potassium: 3.4 mmol/L — ABNORMAL LOW (ref 3.5–5.1)
Sodium: 139 mmol/L (ref 135–145)
Total Bilirubin: 0.5 mg/dL (ref 0.3–1.2)
Total Protein: 6.8 g/dL (ref 6.5–8.1)

## 2020-07-05 LAB — CBC
HCT: 34.2 % — ABNORMAL LOW (ref 36.0–46.0)
Hemoglobin: 10.9 g/dL — ABNORMAL LOW (ref 12.0–15.0)
MCH: 26.8 pg (ref 26.0–34.0)
MCHC: 31.9 g/dL (ref 30.0–36.0)
MCV: 84.2 fL (ref 80.0–100.0)
Platelets: 377 10*3/uL (ref 150–400)
RBC: 4.06 MIL/uL (ref 3.87–5.11)
RDW: 13.2 % (ref 11.5–15.5)
WBC: 5.3 10*3/uL (ref 4.0–10.5)
nRBC: 0 % (ref 0.0–0.2)

## 2020-07-05 LAB — HCG, QUANTITATIVE, PREGNANCY: hCG, Beta Chain, Quant, S: <1 m[IU]/mL (ref <5)

## 2020-07-05 LAB — ACETAMINOPHEN LEVEL: Acetaminophen (Tylenol), Serum: <10 ug/mL — ABNORMAL LOW (ref 10–30)

## 2020-07-05 LAB — SALICYLATE LEVEL: Salicylate Lvl: <7 mg/dL — ABNORMAL LOW (ref 7.0–30.0)

## 2020-07-05 LAB — RESPIRATORY PANEL BY RT PCR (FLU A&B, COVID)
Influenza A by PCR: NEGATIVE
Influenza B by PCR: NEGATIVE
SARS Coronavirus 2 by RT PCR: NEGATIVE

## 2020-07-05 LAB — MAGNESIUM: Magnesium: 2.1 mg/dL (ref 1.7–2.4)

## 2020-07-05 LAB — ETHANOL: Alcohol, Ethyl (B): <10 mg/dL (ref <10)

## 2020-07-05 MED ORDER — LORAZEPAM 1 MG PO TABS
1.0000 mg | ORAL_TABLET | Freq: Once | ORAL | Status: DC
  Filled 2020-07-05: qty 1

## 2020-07-05 MED ORDER — LORAZEPAM 2 MG/ML IJ SOLN
2.0000 mg | Freq: Once | INTRAMUSCULAR | Status: AC
  Administered 2020-07-05: 2 mg via INTRAMUSCULAR
  Filled 2020-07-05: qty 1

## 2020-07-05 MED ORDER — POTASSIUM CHLORIDE CRYS ER 20 MEQ PO TBCR
40.0000 meq | EXTENDED_RELEASE_TABLET | Freq: Once | ORAL | Status: AC
  Administered 2020-07-05: 40 meq via ORAL
  Filled 2020-07-05: qty 2

## 2020-07-05 NOTE — ED Provider Notes (Signed)
[facility] EMERGENCY DEPARTMENT Provider Note   CSN: [id number] Arrival date & time: 07/05/20  0457     History Chief Complaint  Patient presents with  . Suicidal    Latoya [name] is a 29 y.o. female.  Patient presents to the emergency department with a chief complaint of suicide attempt.  She ingested unknown amounts of crack, heroin, melatonin and (5) 400 mg immediate release Seroquel tablets.  She states that she did this with intent to go to sleep forever.  She denies being in any pain.  Unclear time of ingestion, patient does not divulge this information.  [facility], where she had a syncopal event, this prompted her transfer to the emergency department via EMS.  She denies any other associated symptoms.  The history is provided by the patient. No language interpreter was used.       Past Medical History:  Diagnosis Date  . Anemia   . Anxiety   . Arthritis   . Blood transfusion without reported diagnosis   . Crohn's disease ([place])   . Depression   . Drug-seeking behavior   . Gestational diabetes   . History of substance abuse ([place])   . HSV infection   . Pregnancy induced hypertension   . Substance abuse [facility])     Patient Active Problem List   Diagnosis Date Noted  . Post partum depression 07/12/2017  . IBD (inflammatory bowel disease)   . Acute pancreatitis   . Iron deficiency anemia due to chronic blood loss   . Heme positive stool   . Pancolitis ([place]) 07/04/2017  . Generalized abdominal pain   . Diarrhea   . Major depressive disorder, recurrent severe without psychotic features ([place]) 06/29/2017  . Cocaine abuse ([place]) 06/29/2017  . SVD (spontaneous vaginal delivery) 05/15/2017  . Crack cocaine use 05/15/2017  . Cervical dysplasia 01/16/2017  . Obesity (BMI 30.0-34.9) 01/12/2017  . History of substance abuse ([place]) 01/12/2017  . History of gestational diabetes mellitus (GDM) 01/12/2017  . History of gestational hypertension  01/12/2017  . History of macrosomia in infant in prior pregnancy, currently pregnant 01/12/2017  . Short interval between pregnancies affecting pregnancy in second trimester, antepartum 01/12/2017  . Late prenatal care 01/12/2017  . Paxil use in early pregnancy 01/12/2017  . History of suicide attempt 01/12/2017  . Anxiety and depression 01/12/2017  . Supervision of high risk pregnancy, antepartum 01/10/2017  . GDM (gestational diabetes mellitus) 02/25/2016  . Obesity in pregnancy 02/25/2016    Past Surgical History:  Procedure Laterality Date  . CHOLECYSTECTOMY    . COLONOSCOPY N/A 07/07/2017   Procedure: COLONOSCOPY;  Surgeon: Juanita Craver, MD;  Location: [facility] ENDOSCOPY;  Service: Endoscopy;  Laterality: N/A;  . DEEP NECK LYMPH NODE BIOPSY / EXCISION  2011     OB History    Gravida  2   Para  2   Term  2   Preterm  0   AB  0   Living  2     SAB  0   TAB  0   Ectopic  0   Multiple  0   Live Births  2           Family History  Problem Relation Age of Onset  . Diabetes Mother   . Hypertension Mother   . Hypertension Father   . Stroke Maternal Grandfather   . Colon cancer Neg Hx   . Stomach cancer Neg Hx  Social History   Tobacco Use  . Smoking status: Never Smoker  . Smokeless tobacco: Never Used  Vaping Use  . Vaping Use: Never used  Substance Use Topics  . Alcohol use: No    Comment: "not for a while"  . Drug use: Yes    Types: "Crack" cocaine, Cocaine    Comment: cocai, former user, 06/27/2017    Home Medications Prior to Admission medications   Medication Sig Start Date End Date Taking? Authorizing Provider  busPIRone (BUSPAR) 15 MG tablet Take 15 mg by mouth 3 (three) times daily.    Ricard Dillon, MD  FLUoxetine (PROZAC) 10 MG capsule Take 10 mg by mouth at bedtime.    Ricard Dillon, MD  hydrOXYzine (VISTARIL) 50 MG capsule Take 50 mg by mouth 3 (three) times daily as needed for anxiety.    [provider]    Oxcarbazepine (TRILEPTAL) 300 MG tablet Take 300 mg by mouth 2 (two) times daily.    Ricard Dillon, MD  QUEtiapine (SEROQUEL) 400 MG tablet Take 400 mg by mouth at bedtime.  01/18/20   Ricard Dillon, MD  topiramate (TOPAMAX) 25 MG tablet Take 25 mg by mouth 2 (two) times daily.    [provider]  hyoscyamine (LEVSIN SL) 0.125 MG SL tablet Place 1 tablet (0.125 mg total) every 4 (four) hours as needed under the tongue. Patient not taking: Reported on 07/03/2018 08/03/17 07/09/19  Levin Erp, PA  mesalamine (APRISO) 0.375 g 24 hr capsule Take 4 capsules (1.5 g total) by mouth daily. Patient not taking: Reported on 07/03/2018 11/26/17 07/09/19  Mauri Pole, MD  omeprazole (PRILOSEC) 40 MG capsule TAKE 1 CAPSULE (40 MG TOTAL) DAILY BY MOUTH. Patient not taking: Reported on 07/03/2018 11/19/17 07/09/19  Mauri Pole, MD  PARoxetine (PAXIL-CR) 25 MG 24 hr tablet Take 1 tablet (25 mg total) by mouth daily. Patient not taking: Reported on 07/03/2018 05/16/17 07/09/19  Emily Filbert, MD    Allergies    Patient has no known allergies.  Review of Systems   Review of Systems  All other systems reviewed and are negative.   Physical Exam Updated Vital Signs BP 99/63 (BP Location: Right Arm)   Pulse 93   Temp 98 F (36.7 C) (Oral)   Resp 16   SpO2 96%   Physical Exam Vitals and nursing note reviewed.  Constitutional:      General: She is not in acute distress.    Appearance: She is well-developed.  HENT:     Head: Normocephalic and atraumatic.  Eyes:     Conjunctiva/sclera: Conjunctivae normal.  Cardiovascular:     Rate and Rhythm: Normal rate and regular rhythm.     Heart sounds: No murmur heard.   Pulmonary:     Effort: Pulmonary effort is normal. No respiratory distress.     Breath sounds: Normal breath sounds.  Abdominal:     Palpations: Abdomen is soft.     Tenderness: There is no abdominal tenderness.  Musculoskeletal:         General: Normal range of motion.     Cervical back: Neck supple.  Skin:    General: Skin is warm and dry.  Neurological:     Mental Status: She is alert and oriented to person, place, and time.     Comments: Drowsy, but easily arousable  Psychiatric:     Comments: flat     ED Results / Procedures / Treatments  Labs (all labs ordered are listed, but only abnormal results are displayed) Labs Reviewed  COMPREHENSIVE METABOLIC PANEL  ETHANOL  SALICYLATE LEVEL  ACETAMINOPHEN LEVEL  CBC  RAPID URINE DRUG SCREEN, HOSP PERFORMED  MAGNESIUM  CBG MONITORING, ED  I-STAT BETA HCG BLOOD, ED ([facility], [facility], [facility] ONLY)    EKG None  Radiology No results found.  Procedures Procedures (including critical care time)  Medications Ordered in ED Medications - No data to display  ED Course  I have reviewed the triage vital signs and the nursing notes.  Pertinent labs & imaging results that were available during my care of the patient were reviewed by me and considered in my medical decision making (see chart for details).    MDM Rules/Calculators/A&P                          I discussed the case with [facility], who recommends 6-hour cardiac observation, Narcan as needed, benzos as needed, supportive care, check QTC, if greater than 500 optimize magnesium and potassium.  Patient will need to be medically cleared before TTS evaluation.  Patient will be medically clear at noon if reassuring labs.  6:27 AM Refusing labs.  IVC papers completed.  Patient signed out to Carole Civil, who will continue care.    Final Clinical Impression(s) / ED Diagnoses Final diagnoses:  None    Rx / DC Orders ED Discharge Orders    None       Montine Circle, PA-C 07/05/20 [id number]    Palumbo, April, MD 07/05/20 [phone]

## 2020-07-05 NOTE — ED Notes (Signed)
Belongings in locker #3

## 2020-07-05 NOTE — ED Notes (Signed)
Pt refusing blood work. Rob, [place] aware.
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
## 2020-07-05 NOTE — ED Notes (Signed)
Pt cooperative with IM medication with security on standby

## 2020-07-05 NOTE — BH Assessment (Signed)
Marcello Moores NP recommended a inpatient admission for stabilization.

## 2020-07-05 NOTE — ED Notes (Signed)
Pt's dinner tray arrived. Pt also given Ginger Ale.
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
## 2020-07-05 NOTE — ED Triage Notes (Signed)
Pt reports crack, heroin, melatonin and seroquel trying to kill herself at 1 am. Pt was syncopal at the jail arrives via EMS. Vitals, EKG WNL.

## 2020-07-05 NOTE — ED Notes (Signed)
Attempted to awaken pt to speak with TTS.  Pt would not wake up enough to speak.  Will attempt later.

## 2020-07-05 NOTE — ED Provider Notes (Signed)
Care assumed from Dalton at shift change, please see his note for full details, but in brief Latoya [name] is a 29 y.o. female who reports taking 5 tablets of 400 mg immediate release Seroquel as well as unknown amounts of crack, heroin and melatonin in an attempt to "go to sleep forever".  Patient denies being in any pain.  On arrival she is alert and denies being in any pain.  Patient brought in from jail where she had a syncopal event that prompted her transfer to the emergency department.  Poison control consulted which recommends 6-hour observation with cardiac monitoring, Narcan and benzos as needed, supportive care, check QTC and if needed replete magnesium and potassium.  Patient has been IVC it and will need TTS evaluation when she has been medically cleared around noon today.  BP 117/80   Pulse 85   Temp 98 F (36.7 C) (Oral)   Resp 15   SpO2 100%    ED Course/Procedures   Labs Reviewed  COMPREHENSIVE METABOLIC PANEL - Abnormal; Notable for the following components:      Result Value   Potassium 3.4 (*)    All other components within normal limits  SALICYLATE LEVEL - Abnormal; Notable for the following components:   Salicylate Lvl <2.5 (*)    All other components within normal limits  ACETAMINOPHEN LEVEL - Abnormal; Notable for the following components:   Acetaminophen (Tylenol), Serum <10 (*)    All other components within normal limits  CBC - Abnormal; Notable for the following components:   Hemoglobin 10.9 (*)    HCT 34.2 (*)    All other components within normal limits  RESPIRATORY PANEL BY RT PCR (FLU A&B, COVID)  ETHANOL  HCG, QUANTITATIVE, PREGNANCY  MAGNESIUM  RAPID URINE DRUG SCREEN, HOSP PERFORMED  I-STAT BETA HCG BLOOD, ED ([facility], [facility], [facility] ONLY)  CBG MONITORING, ED   EKG Interpretation  Date/Time:  Monday July 05 2020 06:07:45 EDT Ventricular Rate:  92 PR Interval:    QRS Duration: 90 QT Interval:  381 QTC Calculation: 472 R  Axis:   77 Text Interpretation: Sinus rhythm Normal ECG Confirmed by Veryl Speak [phone]) on 07/05/2020 9:34:19 AM   Procedures  MDM   Patient sleeping but easily arousable, her vitals remained stable, lab work shows no leukocytosis, stable hemoglobin, potassium of 3.4 but no other significant electrolyte derangements.  Will give p.o. potassium replacement, patient with normal mag.  Negative tox labs and pregnancy test.  Pending UDS and Covid screening.  We will continue to monitor patient until at least noon before she can be medically cleared.  12:00 PM patient has been monitored in the ED for 6 hours with no worsening symptoms, she is sleeping but remains arousable with normal heart rate and SPO2. She remains under IVC and at this time is medically cleared for TTS evaluation, consult has been placed.  Awaiting TTS recommendations for appropriate disposition. The patient has been placed in psychiatric observation due to the need to provide a safe environment for the patient while obtaining psychiatric consultation and evaluation, as well as ongoing medical and medication management to treat the patient's condition.  The patient has been placed under full IVC at this time.      Jacqlyn Larsen, PA-C 07/05/20 1203    Veryl Speak, MD 07/05/20 1506

## 2020-07-05 NOTE — BH Assessment (Addendum)
@   1206 attempted to complete patient's TTS assessment. However, per nursing Hassan Rowan, RN) staff patient is not wanting to wake up. Nursing was asked to contact TTS when patient is alert and/or awakens.   TTS staff also over at [facility] took the TTS machine to patient's room but stated that patient was sleeping.

## 2020-07-06 ENCOUNTER — Encounter (HOSPITAL_COMMUNITY): Payer: Self-pay | Admitting: Behavioral Health

## 2020-07-06 ENCOUNTER — Other Ambulatory Visit: Payer: Self-pay | Admitting: Behavioral Health

## 2020-07-06 ENCOUNTER — Other Ambulatory Visit: Payer: Self-pay

## 2020-07-06 ENCOUNTER — Inpatient Hospital Stay (HOSPITAL_COMMUNITY)
Admission: AD | Admit: 2020-07-06 | Discharge: 2020-07-12 | DRG: 885 | Disposition: A | Discharge status: Home / Self Care | Payer: Medicaid Other | Attending: Psychiatry | Admitting: Psychiatry

## 2020-07-06 DIAGNOSIS — Z8249 Family history of ischemic heart disease and other diseases of the circulatory system: Secondary | ICD-10-CM

## 2020-07-06 DIAGNOSIS — Z823 Family history of stroke: Secondary | ICD-10-CM

## 2020-07-06 DIAGNOSIS — F1120 Opioid dependence, uncomplicated: Secondary | ICD-10-CM | POA: Diagnosis present

## 2020-07-06 DIAGNOSIS — Z9119 Patient's noncompliance with other medical treatment and regimen: Secondary | ICD-10-CM | POA: Diagnosis not present

## 2020-07-06 DIAGNOSIS — F329 Major depressive disorder, single episode, unspecified: Secondary | ICD-10-CM | POA: Insufficient documentation

## 2020-07-06 DIAGNOSIS — K5090 Crohn's disease, unspecified, without complications: Secondary | ICD-10-CM | POA: Diagnosis present

## 2020-07-06 DIAGNOSIS — K219 Gastro-esophageal reflux disease without esophagitis: Secondary | ICD-10-CM | POA: Diagnosis present

## 2020-07-06 DIAGNOSIS — Z9049 Acquired absence of other specified parts of digestive tract: Secondary | ICD-10-CM | POA: Diagnosis not present

## 2020-07-06 DIAGNOSIS — F319 Bipolar disorder, unspecified: Principal | ICD-10-CM | POA: Diagnosis present

## 2020-07-06 DIAGNOSIS — F1994 Other psychoactive substance use, unspecified with psychoactive substance-induced mood disorder: Secondary | ICD-10-CM

## 2020-07-06 DIAGNOSIS — F29 Unspecified psychosis not due to a substance or known physiological condition: Secondary | ICD-10-CM | POA: Diagnosis present

## 2020-07-06 DIAGNOSIS — Z79899 Other long term (current) drug therapy: Secondary | ICD-10-CM

## 2020-07-06 DIAGNOSIS — Z8632 Personal history of gestational diabetes: Secondary | ICD-10-CM | POA: Diagnosis not present

## 2020-07-06 DIAGNOSIS — F0634 Mood disorder due to known physiological condition with mixed features: Secondary | ICD-10-CM | POA: Diagnosis not present

## 2020-07-06 DIAGNOSIS — F1110 Opioid abuse, uncomplicated: Secondary | ICD-10-CM | POA: Diagnosis not present

## 2020-07-06 DIAGNOSIS — F1410 Cocaine abuse, uncomplicated: Secondary | ICD-10-CM | POA: Diagnosis not present

## 2020-07-06 DIAGNOSIS — G43909 Migraine, unspecified, not intractable, without status migrainosus: Secondary | ICD-10-CM | POA: Diagnosis present

## 2020-07-06 DIAGNOSIS — F419 Anxiety disorder, unspecified: Secondary | ICD-10-CM | POA: Diagnosis present

## 2020-07-06 DIAGNOSIS — F3164 Bipolar disorder, current episode mixed, severe, with psychotic features: Secondary | ICD-10-CM | POA: Diagnosis not present

## 2020-07-06 DIAGNOSIS — Z20822 Contact with and (suspected) exposure to covid-19: Secondary | ICD-10-CM | POA: Diagnosis present

## 2020-07-06 DIAGNOSIS — K8020 Calculus of gallbladder without cholecystitis without obstruction: Secondary | ICD-10-CM | POA: Diagnosis present

## 2020-07-06 DIAGNOSIS — T50902A Poisoning by unspecified drugs, medicaments and biological substances, intentional self-harm, initial encounter: Secondary | ICD-10-CM | POA: Diagnosis not present

## 2020-07-06 DIAGNOSIS — R45851 Suicidal ideations: Secondary | ICD-10-CM | POA: Diagnosis present

## 2020-07-06 DIAGNOSIS — F331 Major depressive disorder, recurrent, moderate: Secondary | ICD-10-CM | POA: Diagnosis not present

## 2020-07-06 DIAGNOSIS — Z833 Family history of diabetes mellitus: Secondary | ICD-10-CM | POA: Diagnosis not present

## 2020-07-06 DIAGNOSIS — F1910 Other psychoactive substance abuse, uncomplicated: Secondary | ICD-10-CM | POA: Diagnosis not present

## 2020-07-06 MED ORDER — LORAZEPAM 2 MG/ML IJ SOLN: 2.0000 mg | Freq: Once | INTRAMUSCULAR | Status: AC

## 2020-07-06 MED ORDER — TOPIRAMATE 25 MG PO TABS
25.0000 mg | ORAL_TABLET | Freq: Two times a day (BID) | ORAL | Status: DC
  Administered 2020-07-07 – 2020-07-12 (×11): 25 mg via ORAL
  Filled 2020-07-06 (×17): qty 1

## 2020-07-06 MED ORDER — OLANZAPINE 10 MG PO TBDP
10.0000 mg | ORAL_TABLET | Freq: Three times a day (TID) | ORAL | Status: DC | PRN
  Administered 2020-07-06 – 2020-07-12 (×2): 10 mg via ORAL
  Filled 2020-07-06 (×2): qty 1

## 2020-07-06 MED ORDER — HYDROXYZINE HCL 25 MG PO TABS: 25.0000 mg | ORAL_TABLET | Freq: Three times a day (TID) | ORAL | Status: DC | PRN

## 2020-07-06 MED ORDER — LORAZEPAM 1 MG PO TABS
1.0000 mg | ORAL_TABLET | ORAL | Status: AC | PRN
  Administered 2020-07-10: 1 mg via ORAL
  Filled 2020-07-06: qty 1

## 2020-07-06 MED ORDER — TRAZODONE HCL 50 MG PO TABS
50.0000 mg | ORAL_TABLET | Freq: Every evening | ORAL | Status: DC | PRN
  Administered 2020-07-07 – 2020-07-11 (×3): 50 mg via ORAL
  Filled 2020-07-06 (×3): qty 1

## 2020-07-06 MED ORDER — HYDROXYZINE HCL 25 MG PO TABS
25.0000 mg | ORAL_TABLET | Freq: Three times a day (TID) | ORAL | Status: DC | PRN
  Administered 2020-07-07 – 2020-07-11 (×3): 25 mg via ORAL
  Filled 2020-07-06 (×3): qty 1

## 2020-07-06 MED ORDER — ZIPRASIDONE MESYLATE 20 MG IM SOLR
20.0000 mg | INTRAMUSCULAR | Status: DC | PRN
  Filled 2020-07-06: qty 20

## 2020-07-06 MED ORDER — QUETIAPINE FUMARATE 400 MG PO TABS
400.0000 mg | ORAL_TABLET | Freq: Every day | ORAL | Status: DC
  Filled 2020-07-06 (×3): qty 1
  Filled 2020-07-06: qty 2

## 2020-07-06 MED ORDER — ALUM & MAG HYDROXIDE-SIMETH 200-200-20 MG/5ML PO SUSP: 30.0000 mL | ORAL | Status: DC | PRN

## 2020-07-06 MED ORDER — FLUOXETINE HCL 20 MG PO CAPS
20.0000 mg | ORAL_CAPSULE | Freq: Every day | ORAL | Status: DC
  Administered 2020-07-07 – 2020-07-12 (×6): 20 mg via ORAL
  Filled 2020-07-06 (×11): qty 1

## 2020-07-06 MED ORDER — MAGNESIUM HYDROXIDE 400 MG/5ML PO SUSP: 30.0000 mL | Freq: Every day | ORAL | Status: DC | PRN

## 2020-07-06 MED ORDER — BUSPIRONE HCL 15 MG PO TABS
15.0000 mg | ORAL_TABLET | Freq: Three times a day (TID) | ORAL | Status: DC
  Administered 2020-07-07 – 2020-07-12 (×15): 15 mg via ORAL
  Filled 2020-07-06 (×22): qty 1

## 2020-07-06 MED ORDER — LORAZEPAM 2 MG/ML IJ SOLN
INTRAMUSCULAR | Status: AC
  Administered 2020-07-06: 2 mg via INTRAMUSCULAR
  Filled 2020-07-06: qty 1

## 2020-07-06 MED ORDER — POTASSIUM CHLORIDE CRYS ER 20 MEQ PO TBCR
20.0000 meq | EXTENDED_RELEASE_TABLET | Freq: Two times a day (BID) | ORAL | Status: DC
  Administered 2020-07-07: 20 meq via ORAL
  Filled 2020-07-06 (×3): qty 1

## 2020-07-06 MED ORDER — ACETAMINOPHEN 325 MG PO TABS: 650.0000 mg | ORAL_TABLET | Freq: Four times a day (QID) | ORAL | Status: DC | PRN

## 2020-07-06 MED ORDER — OXCARBAZEPINE 300 MG PO TABS
300.0000 mg | ORAL_TABLET | Freq: Two times a day (BID) | ORAL | Status: DC
  Administered 2020-07-07: 300 mg via ORAL
  Filled 2020-07-06 (×7): qty 1

## 2020-07-06 MED ORDER — PANTOPRAZOLE SODIUM 40 MG PO TBEC
40.0000 mg | DELAYED_RELEASE_TABLET | Freq: Every day | ORAL | Status: DC
  Administered 2020-07-07 – 2020-07-12 (×6): 40 mg via ORAL
  Filled 2020-07-06 (×11): qty 1

## 2020-07-06 NOTE — ED Notes (Signed)
Pt provided tooth brush and tooth paste and escorted to bathroom by sitter to brush teeth

## 2020-07-06 NOTE — ED Notes (Signed)
Per sitter pt refusing Vital signs at this time

## 2020-07-06 NOTE — H&P (Signed)
Psychiatric Admission Assessment Adult  Patient Identification: Latoya [name] MRN:  [id number] Date of Evaluation:  07/06/2020 Chief Complaint:  MDD (major depressive disorder) [F32.9] Principal Diagnosis: <principal problem not specified> Diagnosis:  Active Problems:   MDD (major depressive disorder)  History of Present Illness: Patient is seen and examined.  Patient is a 29 year old female with a reported past psychiatric history significant for crack cocaine, heroin with recent overdose of crack cocaine, heroin, melatonin and Seroquel to kill her self.  She was apparently found in the bathroom at the jail and then transported here by EMS.  She is not a great historian.  Evaluation service attempted to see the patient on 07/05/2020, but patient refused to wake up at that time.  She was seen again later that afternoon on 10/11.  She had been placed under involuntary commitment.  She denied suicidal ideation at that time.  Patient admitted that she had been recently discharged from the [facility] in [place], [place].  She arrived back in [place] on the Sunday prior to admission.  She relapsed on cocaine on that date.  She was apparently charged with trespassing after having been found in her mother's home earlier in the day.  She also had ingested several Seroquel at that time.  Reportedly she was to go into a sober living facility in [place], but secondary to the relapse she would not be accepted into that facility.  Reportedly she does have a history of trauma.  She did report that she has had multiple past psychiatric hospitalizations and inpatient treatment for detox.  She was apparently placed on fluoxetine, Trileptal, quetiapine and hydroxyzine as well is topiramate.  She was transported to our facility for evaluation and stabilization.  Review of the electronic medical record revealed a history of Crohn's disease.  She was apparently placed under involuntary commitment on  06/12/2020.  She had recently been discharged from [facility] at that time.  Her list of medications from the emergency room visit on 06/12/2020 included buspirone, fluoxetine, hydroxyzine, Trileptal, quetiapine, topiramate, high Cosamin and mesalamine as well as omeprazole.  As well as the Crohn's disease she apparently does have a history of pancreatitis with normal lipid panel and presence of gallstones.  Review of the electronic medical record also revealed that on 07/03/2020 the patient was seen in the emergency department at the [facility] facility in [place].  She presented at that time requesting drug rehab.  She was at [facility] until apparently the night of 10/9.  The police brought her to their facility.  She denied any suicidal or homicidal ideation.  She stated she had been clean from drugs for between 3-1/2 to 4 weeks.  That was apparently the amount of time she had been at the [facility].  She stated she was afraid she would relapse if she did not have somewhere else to go for more rehab.  Associated Signs/Symptoms: Depression Symptoms:  depressed mood, anhedonia, fatigue, feelings of worthlessness/guilt, difficulty concentrating, hopelessness, suicidal thoughts with specific plan, anxiety, disturbed sleep, Duration of Depression Symptoms: No data recorded (Hypo) Manic Symptoms:  Impulsivity, Irritable Mood, Labiality of Mood, Anxiety Symptoms:  Excessive Worry, Psychotic Symptoms:  Denied Duration of Psychotic Symptoms: No data recorded PTSD Symptoms: Had a traumatic exposure:  Per report Total Time spent with patient: 30 minutes  Past Psychiatric History: Patient has apparently been in multiple emergency rooms and been in rehabilitation facilities in the past.  She was just released from  a 3-1/2 to 4-week program at [facility].  She has been on psychiatric medications in the past including Seroquel, Paxil, Rexulti, BuSpar, fluoxetine, hydroxyzine, quetiapine,  topiramate.  Is the patient at risk to self? Yes.    Has the patient been a risk to self in the past 6 months? Yes.    Has the patient been a risk to self within the distant past? No.  Is the patient a risk to others? No.  Has the patient been a risk to others in the past 6 months? No.  Has the patient been a risk to others within the distant past? No.   Prior Inpatient Therapy:   Prior Outpatient Therapy:    Alcohol Screening:   Substance Abuse History in the last 12 months:  Yes.   Consequences of Substance Abuse: Medical Consequences:  The overdose led to this admission. Previous Psychotropic Medications: Yes  Psychological Evaluations: Yes  Past Medical History:  Past Medical History:  Diagnosis Date  . Anemia   . Anxiety   . Arthritis   . Blood transfusion without reported diagnosis   . Crohn's disease ([place])   . Depression   . Drug-seeking behavior   . Gestational diabetes   . History of substance abuse ([place])   . HSV infection   . Pregnancy induced hypertension   . Substance abuse [facility])     Past Surgical History:  Procedure Laterality Date  . CHOLECYSTECTOMY    . COLONOSCOPY N/A 07/07/2017   Procedure: COLONOSCOPY;  Surgeon: Juanita Craver, MD;  Location: [facility] ENDOSCOPY;  Service: Endoscopy;  Laterality: N/A;  . DEEP NECK LYMPH NODE BIOPSY / EXCISION  2011   Family History:  Family History  Problem Relation Age of Onset  . Diabetes Mother   . Hypertension Mother   . Hypertension Father   . Stroke Maternal Grandfather   . Colon cancer Neg Hx   . Stomach cancer Neg Hx    Family Psychiatric  History: Noncontributory Tobacco Screening:   Social History:  Social History   Substance and Sexual Activity  Alcohol Use No   Comment: "not for a while"     Social History   Substance and Sexual Activity  Drug Use Yes  . Types: "Crack" cocaine, Cocaine   Comment: cocai, former user, 06/27/2017    Additional Social History:                            Allergies:  No Known Allergies Lab Results:  Results for orders placed or performed during the hospital encounter of 07/05/20 (from the past 48 hour(s))  Comprehensive metabolic panel     Status: Abnormal   Collection Time: 07/05/20  8:26 AM  Result Value Ref Range   Sodium 139 135 - 145 mmol/L   Potassium 3.4 (L) 3.5 - 5.1 mmol/L   Chloride 107 98 - 111 mmol/L   CO2 23 22 - 32 mmol/L   Glucose, Bld 87 70 - 99 mg/dL    Comment: Glucose reference range applies only to samples taken after fasting for at least 8 hours.   BUN 8 6 - 20 mg/dL   Creatinine, Ser 0.62 0.44 - 1.00 mg/dL   Calcium 9.1 8.9 - 10.3 mg/dL   Total Protein 6.8 6.5 - 8.1 g/dL   Albumin 3.9 3.5 - 5.0 g/dL   AST 15 15 - 41 U/L   ALT 17 0 - 44 U/L   Alkaline Phosphatase 54  38 - 126 U/L   Total Bilirubin 0.5 0.3 - 1.2 mg/dL   GFR, Estimated >60 >60 mL/min   Anion gap 9 5 - 15    Comment: Performed at [facility] [address] [place], [place] [address]  Ethanol     Status: None   Collection Time: 07/05/20  8:26 AM  Result Value Ref Range   Alcohol, Ethyl (B) <10 <10 mg/dL    Comment: (NOTE) Lowest detectable limit for serum alcohol is 10 mg/dL.  For medical purposes only. Performed at [facility], [place] [address], [place], [place] [address]   Salicylate level     Status: Abnormal   Collection Time: 07/05/20  8:26 AM  Result Value Ref Range   Salicylate Lvl <7.2 (L) 7.0 - 30.0 mg/dL    Comment: Performed at [facility] [address], [place], [place] [address]  Acetaminophen level     Status: Abnormal   Collection Time: 07/05/20  8:26 AM  Result Value Ref Range   Acetaminophen (Tylenol), Serum <10 (L) 10 - 30 ug/mL    Comment: (NOTE) Therapeutic concentrations vary significantly. A range of 10-30 ug/mL  may be an effective concentration for many patients. However, some  are best treated at concentrations outside of this range. Acetaminophen concentrations >150 ug/mL at 4 hours  after ingestion  and >50 ug/mL at 12 hours after ingestion are often associated with  toxic reactions.  Performed at [facility], [place] [address] [place], [place] [address]   cbc     Status: Abnormal   Collection Time: 07/05/20  8:26 AM  Result Value Ref Range   WBC 5.3 4.0 - 10.5 K/uL   RBC 4.06 3.87 - 5.11 MIL/uL   Hemoglobin 10.9 (L) 12.0 - 15.0 g/dL   HCT 34.2 (L) 36 - 46 %   MCV 84.2 80.0 - 100.0 fL   MCH 26.8 26.0 - 34.0 pg   MCHC 31.9 30.0 - 36.0 g/dL   RDW 13.2 11.5 - 15.5 %   Platelets 377 150 - 400 K/uL   nRBC 0.0 0.0 - 0.2 %    Comment: Performed at [facility], [place] [address] [place], [place] [address]  hCG, quantitative, pregnancy     Status: None   Collection Time: 07/05/20  8:26 AM  Result Value Ref Range   hCG, Beta Chain, Quant, S <1 <5 mIU/mL    Comment:          GEST. AGE      CONC.  (mIU/mL)   <=1 WEEK        5 - 50     2 WEEKS       50 - 500     3 WEEKS       100 - 10,000     4 WEEKS     1,000 - 30,000     5 WEEKS     3,500 - 115,000   6-8 WEEKS     12,000 - 270,000    12 WEEKS     15,000 - 220,000        FEMALE AND NON-PREGNANT FEMALE:     LESS THAN 5 mIU/mL Performed at [facility], [place] [address], [place],  [address]   Magnesium     Status: None   Collection Time: 07/05/20  8:26 AM  Result Value Ref Range   Magnesium 2.1 1.7 - 2.4 mg/dL    Comment:  Performed at [facility], [place] [address], [place], [place] [address]  Respiratory Panel by RT PCR (Flu A&B, Covid) - Nasopharyngeal Swab     Status: None   Collection Time: 07/05/20 12:05 PM   Specimen: Nasopharyngeal Swab  Result Value Ref Range   SARS Coronavirus 2 by RT PCR NEGATIVE NEGATIVE    Comment: (NOTE) SARS-CoV-2 target nucleic acids are NOT DETECTED.  The SARS-CoV-2 RNA is generally detectable in upper respiratoy specimens during the acute phase of infection. The lowest concentration of SARS-CoV-2 viral copies this assay can detect is 131 copies/mL.  A negative result does not preclude SARS-Cov-2 infection and should not be used as the sole basis for treatment or other patient management decisions. A negative result may occur with  improper specimen collection/handling, submission of specimen other than nasopharyngeal swab, presence of viral mutation(s) within the areas targeted by this assay, and inadequate number of viral copies (<131 copies/mL). A negative result must be combined with clinical observations, patient history, and epidemiological information. The expected result is Negative.  Fact Sheet for Patients:  PinkCheek.be  Fact Sheet for Healthcare Providers:  GravelBags.it  This test is no t yet approved or cleared by the [place] FDA and  has been authorized for detection and/or diagnosis of SARS-CoV-2 by FDA under an Emergency Use Authorization (EUA). This EUA will remain  in effect (meaning this test can be used) for the duration of the COVID-19 declaration under Section 564(b)(1) of the Act, 21 U.S.C. section 360bbb-3(b)(1), unless the authorization is terminated or revoked sooner.     Influenza A by PCR NEGATIVE NEGATIVE   Influenza B by PCR NEGATIVE NEGATIVE    Comment: (NOTE) The Xpert Xpress SARS-CoV-2/FLU/RSV assay is intended as an aid in  the diagnosis of influenza from Nasopharyngeal swab specimens and  should not be used as a sole basis for treatment. Nasal washings and  aspirates are unacceptable for Xpert Xpress SARS-CoV-2/FLU/RSV  testing.  Fact Sheet for Patients: PinkCheek.be  Fact Sheet for Healthcare Providers: GravelBags.it  This test is not yet approved or cleared by the [place] FDA and  has been authorized for detection and/or diagnosis of SARS-CoV-2 by  FDA under an Emergency Use Authorization (EUA). This EUA will remain  in effect (meaning this test can be used)  for the duration of the  Covid-19 declaration under Section 564(b)(1) of the Act, 21  U.S.C. section 360bbb-3(b)(1), unless the authorization is  terminated or revoked. Performed at [facility], [place] [address] [place], [place] [address]     Blood Alcohol level:  Lab Results  Component Value Date   [facility] <10 07/05/2020   ETH <10 23/30/0762    Metabolic Disorder Labs:  Lab Results  Component Value Date   HGBA1C 5.2 01/10/2017   No results found for: PROLACTIN Lab Results  Component Value Date   CHOL 109 07/09/2017   TRIG 138 07/09/2017   HDL 44 07/09/2017   CHOLHDL 2.5 07/09/2017   VLDL 28 07/09/2017   LDLCALC 37 07/09/2017    Current Medications: Current Facility-Administered Medications  Medication Dose Route Frequency Provider Last Rate Last Admin  . acetaminophen (TYLENOL) tablet 650 mg  650 mg Oral Q6H PRN Connye Burkitt, NP      . alum & mag hydroxide-simeth (MAALOX/MYLANTA) 200-200-20 MG/5ML suspension 30 mL  30 mL Oral Q4H PRN Connye Burkitt, NP      . busPIRone (BUSPAR) tablet 15 mg  15 mg Oral TID Mallie Darting,  Cordie Grice, MD      . FLUoxetine [facility]) capsule 20 mg  20 mg Oral Daily Sharma Covert, MD      . hydrOXYzine (ATARAX/VISTARIL) tablet 25 mg  25 mg Oral TID PRN Connye Burkitt, NP      . OLANZapine zydis (ZYPREXA) disintegrating tablet 10 mg  10 mg Oral Q8H PRN Connye Burkitt, NP       And  . LORazepam (ATIVAN) tablet 1 mg  1 mg Oral PRN Connye Burkitt, NP       And  . ziprasidone (GEODON) injection 20 mg  20 mg Intramuscular PRN Connye Burkitt, NP      . magnesium hydroxide (MILK OF MAGNESIA) suspension 30 mL  30 mL Oral Daily PRN Connye Burkitt, NP      . Oxcarbazepine (TRILEPTAL) tablet 300 mg  300 mg Oral BID Sharma Covert, MD      . pantoprazole (PROTONIX) EC tablet 40 mg  40 mg Oral Daily Sharma Covert, MD      . potassium chloride SA (KLOR-CON) CR tablet 20 mEq  20 mEq Oral BID Sharma Covert, MD      . QUEtiapine (SEROQUEL)  tablet 400 mg  400 mg Oral QHS Sharma Covert, MD      . topiramate (TOPAMAX) tablet 25 mg  25 mg Oral BID Sharma Covert, MD      . traZODone (DESYREL) tablet 50 mg  50 mg Oral QHS PRN Connye Burkitt, NP       PTA Medications: Medications Prior to Admission  Medication Sig Dispense Refill Last Dose  . busPIRone (BUSPAR) 15 MG tablet Take 15 mg by mouth 3 (three) times daily.     . [facility] MELATONIN 3 MG TABS tablet Take 6 mg by mouth at bedtime.     Marland Kitchen FLUoxetine (PROZAC) 10 MG capsule Take 10 mg by mouth at bedtime.     . hydrOXYzine (VISTARIL) 50 MG capsule Take 50 mg by mouth 3 (three) times daily as needed for anxiety.     . Oxcarbazepine (TRILEPTAL) 300 MG tablet Take 300 mg by mouth 2 (two) times daily.     . QUEtiapine (SEROQUEL) 400 MG tablet Take 400 mg by mouth at bedtime.      . topiramate (TOPAMAX) 25 MG tablet Take 25 mg by mouth 2 (two) times daily.       Musculoskeletal: Strength & Muscle Tone: within normal limits Gait & Station: normal Patient leans: N/A  Psychiatric Specialty Exam: Physical Exam Vitals and nursing note reviewed.  HENT:     Head: Normocephalic and atraumatic.  Pulmonary:     Effort: Pulmonary effort is normal.  Neurological:     General: No focal deficit present.     Mental Status: She is alert and oriented to person, place, and time.     Review of Systems  All other systems reviewed and are negative.   unknown if currently breastfeeding.There is no height or weight on file to calculate BMI.  General Appearance: Disheveled  Eye Contact:  Minimal  Speech:  Normal Rate  Volume:  Decreased  Mood:  Anxious, Dysphoric and Irritable  Affect:  Constricted  Thought Process:  Coherent and Descriptions of Associations: Circumstantial  Orientation:  Full (Time, Place, and Person)  Thought Content:  Rumination  Suicidal Thoughts:  Yes.  without intent/plan  Homicidal Thoughts:  No  Memory:  Immediate;   Fair Recent;   Fair Remote;  Fair   Judgement:  Impaired  Insight:  Lacking  Psychomotor Activity:  Increased  Concentration:  Concentration: Fair  Recall:  [employer] of Knowledge:  Fair  Language:  Fair  Akathisia:  Negative  Handed:  Right  AIMS (if indicated):     Assets:  Desire for Improvement Resilience  ADL's:  Intact  Cognition:  WNL  Sleep:       Treatment Plan Summary: Daily contact with patient to assess and evaluate symptoms and progress in treatment, Medication management and Plan : Patient is seen and examined.  Patient is a 29 year old female with the above-stated past psychiatric history who was admitted after an intentional overdose of substances and what was thought to be a suicide attempt.  She will be admitted to the hospital.  She will be integrated in the milieu.  She will be encouraged to attend groups.  Given her sobriety of substances I do not think that we have to be too overwhelmingly concerned about possible withdrawal syndromes.  We will continue her psychiatric medications as best as possible.  For now we will continue the BuSpar, fluoxetine, hydroxyzine, Trileptal, Seroquel, Topamax and hycosamine.  We will also continue the mesalamine, omeprazole if the patient wants them.  Review of the laboratories revealed a potassium of 3.4.  We will supplement that.  The rest of her electrolytes including creatinine and liver function enzymes were normal.  Her CBC was essentially normal with a mild anemia with a hemoglobin of 10.9 and hematocrit of 34.2.  Platelets were normal at 377,000.  Her beta-hCG was less than 1.  Blood alcohol was less than 10.  Salicylate was less than 7.  Acetaminophen was less than 10.  Drug screen was positive for benzodiazepines and cocaine.  AG showed a sinus rhythm with a normal QTc interval.  Her vital signs are stable, she is afebrile.  Observation Level/Precautions:  15 minute checks  Laboratory:  Chemistry Profile  Psychotherapy:    Medications:    Consultations:     Discharge Concerns:    Estimated LOS:  Other:     Physician Treatment Plan for Primary Diagnosis: <principal problem not specified> Long Term Goal(s): Improvement in symptoms so as ready for discharge  Short Term Goals: Ability to identify changes in lifestyle to reduce recurrence of condition will improve, Ability to verbalize feelings will improve, Ability to disclose and discuss suicidal ideas, Ability to demonstrate self-control will improve, Ability to identify and develop effective coping behaviors will improve, Ability to maintain clinical measurements within normal limits will improve and Ability to identify triggers associated with substance abuse/mental health issues will improve  Physician Treatment Plan for Secondary Diagnosis: Active Problems:   MDD (major depressive disorder)  Long Term Goal(s): Improvement in symptoms so as ready for discharge  Short Term Goals: Ability to identify changes in lifestyle to reduce recurrence of condition will improve, Ability to verbalize feelings will improve, Ability to disclose and discuss suicidal ideas, Ability to demonstrate self-control will improve, Ability to identify and develop effective coping behaviors will improve, Ability to maintain clinical measurements within normal limits will improve and Ability to identify triggers associated with substance abuse/mental health issues will improve  I certify that inpatient services furnished can reasonably be expected to improve the patient's condition.    Sharma Covert, MD 10/12/20214:10 PM
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
## 2020-07-06 NOTE — Tx Team (Signed)
Initial Treatment Plan 07/06/2020 8:51 PM [name] OKP:[id number]    PATIENT STRESSORS: Financial difficulties Medication change or noncompliance Substance abuse Traumatic event   PATIENT STRENGTHS: Capable of independent living Physical Health Supportive family/friends   PATIENT IDENTIFIED PROBLEMS: Alterations in mood (Agitation & irritability) "I don't want to be here. They sent me here".    Substance Abuse "I use crack cocaine and heroine"                 DISCHARGE CRITERIA:  Improved stabilization in mood, thinking, and/or behavior Verbal commitment to aftercare and medication compliance  PRELIMINARY DISCHARGE PLAN: Outpatient therapy Placement in alternative living arrangements  PATIENT/FAMILY INVOLVEMENT: This treatment plan has been presented to and reviewed with the patient, Latoya [name].  The patient have been given the opportunity to ask questions and make suggestions.  Keane Police, RN 07/06/2020, 8:51 PM

## 2020-07-06 NOTE — ED Notes (Signed)
After shower pt continues to state she is going to leave and throws her lunch tray onto the ground.  Pt states she is leaving or we are going to have to take her to jail, PD at bedside attempts to explain IVC policy to pt.  Pt cursing at staff and refusing to take oral medications.  16m IM ativan ordered, pt states she will not allow staff to give medications but sits calmly and allows RN to give medications

## 2020-07-06 NOTE — ED Notes (Signed)
Pt taken to private room by sitter for TTS consult

## 2020-07-06 NOTE — ED Notes (Signed)
Pt resting in bed with eyes closed. Does not appear in distress, respirations are even and non-labored Skin is warm, dry and intact.   Sitter remains at bedside

## 2020-07-06 NOTE — [facility] Suicide Risk Assessment (Signed)
[facility] Admission Suicide Risk Assessment   Nursing information obtained from:    Demographic factors:    Current Mental Status:    Loss Factors:    Historical Factors:    Risk Reduction Factors:     Total Time spent with patient: 30 minutes Principal Problem: <principal problem not specified> Diagnosis:  Active Problems:   MDD (major depressive disorder)  Subjective Data: Patient is seen and examined.  Patient is a 29 year old female with a reported past psychiatric history significant for crack cocaine, heroin with recent overdose of crack cocaine, heroin, melatonin and Seroquel to kill her self.  She was apparently found in the bathroom at the jail and then transported here by EMS.  She is not a great historian.  Evaluation service attempted to see the patient on 07/05/2020, but patient refused to wake up at that time.  She was seen again later that afternoon on 10/11.  She had been placed under involuntary commitment.  She denied suicidal ideation at that time.  Patient admitted that she had been recently discharged from the [facility] in [place], [place].  She arrived back in [place] on the Sunday prior to admission.  She relapsed on cocaine on that date.  She was apparently charged with trespassing after having been found in her mother's home earlier in the day.  She also had ingested several Seroquel at that time.  Reportedly she was to go into a sober living facility in [place], but secondary to the relapse she would not be accepted into that facility.  Reportedly she does have a history of trauma.  She did report that she has had multiple past psychiatric hospitalizations and inpatient treatment for detox.  She was apparently placed on fluoxetine, Trileptal, quetiapine and hydroxyzine as well is topiramate.  She was transported to our facility for evaluation and stabilization.  Review of the electronic medical record revealed a history of Crohn's disease.  She was apparently  placed under involuntary commitment on 06/12/2020.  She had recently been discharged from old [facility] at that time.  Her list of medications from the emergency room visit on 06/12/2020 included buspirone, fluoxetine, hydroxyzine, Trileptal, quetiapine, topiramate, high Cosamin and mesalamine as well as omeprazole.  As well as the Crohn's disease she apparently does have a history of pancreatitis with normal lipid panel and presence of gallstones.  Review of the electronic medical record also revealed that on 07/03/2020 the patient was seen in the emergency department at the [facility] facility in [place].  She presented at that time requesting drug rehab.  She was at [facility] until apparently the night of 10/9.  The police brought her to their facility.  She denied any suicidal or homicidal ideation.  She stated she had been clean from drugs for between 3-1/2 to 4 weeks.  That was apparently the amount of time she had been at the [facility].  She stated she was afraid she would relapse if she did not have somewhere else to go for more rehab.  Continued Clinical Symptoms:    The "Alcohol Use Disorders Identification Test", Guidelines for Use in Primary Care, Second Edition.  World Pharmacologist [facility]). Score between 0-7:  no or low risk or alcohol related problems. Score between 8-15:  moderate risk of alcohol related problems. Score between 16-19:  high risk of alcohol related problems. Score 20 or above:  warrants further diagnostic evaluation for alcohol dependence and treatment.   CLINICAL FACTORS:  Depression:   Anhedonia Comorbid alcohol abuse/dependence Hopelessness Impulsivity Insomnia Alcohol/Substance Abuse/Dependencies More than one psychiatric diagnosis   Musculoskeletal: Strength & Muscle Tone: within normal limits Gait & Station: normal Patient leans: N/A  Psychiatric Specialty Exam: Physical Exam Vitals and nursing note reviewed.  HENT:     Head: Normocephalic  and atraumatic.  Pulmonary:     Effort: Pulmonary effort is normal.  Neurological:     General: No focal deficit present.     Mental Status: She is alert.     Review of Systems  All other systems reviewed and are negative.   unknown if currently breastfeeding.There is no height or weight on file to calculate BMI.  General Appearance: Disheveled  Eye Contact:  Minimal  Speech:  Normal Rate  Volume:  Normal  Mood:  Irritable  Affect:  Constricted  Thought Process:  Goal Directed and Descriptions of Associations: Circumstantial  Orientation:  Full (Time, Place, and Person)  Thought Content:  Rumination  Suicidal Thoughts:  No  Homicidal Thoughts:  No  Memory:  Immediate;   Poor Recent;   Poor Remote;   Poor  Judgement:  Impaired  Insight:  Lacking  Psychomotor Activity:  Increased  Concentration:  Concentration: Fair and Attention Span: Fair  Recall:  [employer] of Knowledge:  Fair  Language:  Fair  Akathisia:  Negative  Handed:  Right  AIMS (if indicated):     Assets:  Desire for Improvement Resilience  ADL's:  Intact  Cognition:  WNL  Sleep:         COGNITIVE FEATURES THAT CONTRIBUTE TO RISK:  Thought constriction (tunnel vision)    SUICIDE RISK:   Minimal: No identifiable suicidal ideation.  Patients presenting with no risk factors but with morbid ruminations; may be classified as minimal risk based on the severity of the depressive symptoms  PLAN OF CARE: Patient is seen and examined.  Patient is a 29 year old female with the above-stated past psychiatric history who was admitted after an intentional overdose of substances and what was thought to be a suicide attempt.  She will be admitted to the hospital.  She will be integrated in the milieu.  She will be encouraged to attend groups.  Given her sobriety of substances I do not think that we have to be too overwhelmingly concerned about possible withdrawal syndromes.  We will continue her psychiatric medications as  best as possible.  For now we will continue the BuSpar, fluoxetine, hydroxyzine, Trileptal, Seroquel, Topamax and hycosamine.  We will also continue the mesalamine, omeprazole if the patient wants them.  Review of the laboratories revealed a potassium of 3.4.  We will supplement that.  The rest of her electrolytes including creatinine and liver function enzymes were normal.  Her CBC was essentially normal with a mild anemia with a hemoglobin of 10.9 and hematocrit of 34.2.  Platelets were normal at 377,000.  Her beta-hCG was less than 1.  Blood alcohol was less than 10.  Salicylate was less than 7.  Acetaminophen was less than 10.  Drug screen was positive for benzodiazepines and cocaine.  AG showed a sinus rhythm with a normal QTc interval.  Her vital signs are stable, she is afebrile.  I certify that inpatient services furnished can reasonably be expected to improve the patient's condition.   Sharma Covert, MD 07/06/2020, 3:46 PM
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
## 2020-07-06 NOTE — Progress Notes (Signed)
Pt accepted to [facility], bed 303-2  Mordecai Maes, NP is the accepting provider.    Dr. Mallie Darting is the attending provider.    Call report to [place] @ [facility] ED notified.     Pt is under IVC and will be transported by law enforcement  Pt is scheduled to arrive at [facility] at [place], MSW, LCSW, [place] Worker II Disposition CSW [phone]

## 2020-07-06 NOTE — ED Notes (Signed)
Pt states she is going to leave. Educated on [employer] and policies.  Informed that she has a bed at the [facility] but we are just waiting on transportation to come and pick her up.   Pt tearful, stating she did not say she was suicidal and didn't ingest any drugs or medications with intent to kill her self  Pt walks away from sitter trying to find exit, security called. Staff able to redirect pt back to bed and come to an agreement to take a shower and wait for transport

## 2020-07-06 NOTE — Progress Notes (Signed)
Pt avoidant on the unit, appeared paranoid. Pt refused HS medications. Pt educated on IVC process . Pt encouraged to talk to the doctor      07/06/20 2200  Psych Admission Type (Psych Patients Only)  Admission Status Involuntary  Psychosocial Assessment  Patient Complaints Anxiety  Eye Contact Avoids  Facial Expression Sad  Affect Anxious  Speech Argumentative  Interaction Avoidant  Motor Activity Slow  Appearance/Hygiene Disheveled  Behavior Characteristics Unwilling to participate  Mood Anxious  Thought Process  Coherency WDL  Content WDL  Delusions WDL  Perception WDL  Hallucination UTA  Judgment Impaired  Confusion WDL  Danger to Self  Current suicidal ideation? Denies  Danger to Others  Danger to Others None reported or observed

## 2020-07-07 DIAGNOSIS — F1994 Other psychoactive substance use, unspecified with psychoactive substance-induced mood disorder: Secondary | ICD-10-CM | POA: Diagnosis not present

## 2020-07-07 MED ORDER — HALOPERIDOL 5 MG PO TABS
10.0000 mg | ORAL_TABLET | Freq: Two times a day (BID) | ORAL | Status: DC
  Administered 2020-07-07 – 2020-07-08 (×2): 10 mg via ORAL
  Filled 2020-07-07 (×4): qty 2

## 2020-07-07 MED ORDER — ZIPRASIDONE MESYLATE 20 MG IM SOLR
20.0000 mg | Freq: Once | INTRAMUSCULAR | Status: AC
  Administered 2020-07-07: 20 mg via INTRAMUSCULAR
  Filled 2020-07-07: qty 20

## 2020-07-07 MED ORDER — HALOPERIDOL LACTATE 5 MG/ML IJ SOLN
10.0000 mg | Freq: Two times a day (BID) | INTRAMUSCULAR | Status: DC
  Filled 2020-07-07 (×4): qty 2

## 2020-07-07 MED ORDER — OXCARBAZEPINE 300 MG PO TABS
450.0000 mg | ORAL_TABLET | Freq: Two times a day (BID) | ORAL | Status: DC
  Administered 2020-07-07 – 2020-07-12 (×10): 450 mg via ORAL
  Filled 2020-07-07 (×15): qty 1

## 2020-07-07 NOTE — Progress Notes (Signed)
Pt forward minimal information this evening, pt stated she wanted her Seroquel, pt encouraged to talk to the doctor    07/07/20 2300  Psych Admission Type (Psych Patients Only)  Admission Status Involuntary  Psychosocial Assessment  Patient Complaints Anxiety;Irritability  Eye Contact Avoids  Facial Expression Sad  Affect Anxious  Speech Argumentative  Interaction Avoidant  Motor Activity Slow  Appearance/Hygiene Disheveled  Behavior Characteristics Unwilling to participate  Mood Labile  Thought Process  Coherency WDL  Content WDL  Delusions WDL  Perception WDL  Hallucination UTA  Judgment Impaired  Confusion WDL  Danger to Self  Current suicidal ideation? Denies  Danger to Others  Danger to Others None reported or observed
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
## 2020-07-07 NOTE — BHH Group Notes (Signed)
Pt did not attend orientation group.

## 2020-07-07 NOTE — Progress Notes (Addendum)
[facility] MD Progress Note  07/07/2020 10:11 AM Latoya [name]  MRN:  [id number] Subjective: Patient is a 29 year old female with a reported past psychiatric history significant for crack cocaine, heroin and recent overdose of this in addition to Seroquel to kill her self.  She has a reported history as well of bipolar disorder.  Objective: Patient is seen and examined.  Patient is a 29 year old female with the above-stated past psychiatric history who is seen in follow-up.  Review of the electronic medical record revealed that the patient had been noncompliant with treatment.  She had not taken any of her medications.  Nursing notes felt as though she was paranoid.  This morning the patient denied auditory or visual hallucinations.  She denied suicidal or homicidal ideation.  She stated she just wanted to be transferred to another ward.  She stated she felt like she was in the twilight zone.  I told her I would be unable to transfer her over to the [facility] until she demonstrated some compliance with her medications.  Her main desire is to be placed in a longer term substance abuse treatment program.  Her vital signs are stable, she is afebrile.  She does not appear to be in any withdrawal symptoms.  Review of her admission laboratories revealed no new laboratories.  TSH is still missing, and that will be ordered.  Principal Problem: <principal problem not specified> Diagnosis: Active Problems:   MDD (major depressive disorder)  Total Time spent with patient: 20 minutes  Past Psychiatric History: See admission H&P  Past Medical History:  Past Medical History:  Diagnosis Date  . Anemia   . Anxiety   . Arthritis   . Blood transfusion without reported diagnosis   . Crohn's disease ([place])   . Depression   . Drug-seeking behavior   . Gestational diabetes   . History of substance abuse ([place])   . HSV infection   . Pregnancy induced hypertension   . Substance abuse [facility])     Past Surgical  History:  Procedure Laterality Date  . CHOLECYSTECTOMY    . COLONOSCOPY N/A 07/07/2017   Procedure: COLONOSCOPY;  Surgeon: Juanita Craver, MD;  Location: [facility] ENDOSCOPY;  Service: Endoscopy;  Laterality: N/A;  . DEEP NECK LYMPH NODE BIOPSY / EXCISION  2011   Family History:  Family History  Problem Relation Age of Onset  . Diabetes Mother   . Hypertension Mother   . Hypertension Father   . Stroke Maternal Grandfather   . Colon cancer Neg Hx   . Stomach cancer Neg Hx    Family Psychiatric  History: See admission H&P Social History:  Social History   Substance and Sexual Activity  Alcohol Use No   Comment: "not for a while"     Social History   Substance and Sexual Activity  Drug Use Yes  . Types: "Crack" cocaine, Cocaine   Comment: cocai, former user, 06/27/2017    Social History   Socioeconomic History  . Marital status: Single    Spouse name: Not on file  . Number of children: 2  . Years of education: Not on file  . Highest education level: Not on file  Occupational History  . Not on file  Tobacco Use  . Smoking status: Never Smoker  . Smokeless tobacco: Never Used  Vaping Use  . Vaping Use: Never used  Substance and Sexual Activity  . Alcohol use: No    Comment: "not for a while"  . Drug use: Yes  Types: "Crack" cocaine, Cocaine    Comment: cocai, former user, 06/27/2017  . Sexual activity: Not Currently    Partners: Male    Birth control/protection: None  Other Topics Concern  . Not on file  Social History Narrative  . Not on file   Social Determinants of Health   Financial Resource Strain:   . Difficulty of Paying Living Expenses: Not on file  Food Insecurity:   . Worried About Charity fundraiser in the Last Year: Not on file  . Ran Out of Food in the Last Year: Not on file  Transportation Needs:   . Lack of Transportation (Medical): Not on file  . Lack of Transportation (Non-Medical): Not on file  Physical Activity:   . Days of Exercise per  Week: Not on file  . Minutes of Exercise per Session: Not on file  Stress:   . Feeling of Stress : Not on file  Social Connections:   . Frequency of Communication with Friends and Family: Not on file  . Frequency of Social Gatherings with Friends and Family: Not on file  . Attends Religious Services: Not on file  . Active Member of Clubs or Organizations: Not on file  . Attends Archivist Meetings: Not on file  . Marital Status: Not on file   Additional Social History:                         Sleep: Good  Appetite:  Fair  Current Medications: Current Facility-Administered Medications  Medication Dose Route Frequency Provider Last Rate Last Admin  . acetaminophen (TYLENOL) tablet 650 mg  650 mg Oral Q6H PRN Connye Burkitt, NP      . alum & mag hydroxide-simeth (MAALOX/MYLANTA) 200-200-20 MG/5ML suspension 30 mL  30 mL Oral Q4H PRN Connye Burkitt, NP      . busPIRone (BUSPAR) tablet 15 mg  15 mg Oral TID Sharma Covert, MD   15 mg at 07/07/20 1009  . FLUoxetine (PROZAC) capsule 20 mg  20 mg Oral Daily Sharma Covert, MD   20 mg at 07/07/20 1009  . hydrOXYzine (ATARAX/VISTARIL) tablet 25 mg  25 mg Oral TID PRN Connye Burkitt, NP   25 mg at 07/07/20 1011  . OLANZapine zydis (ZYPREXA) disintegrating tablet 10 mg  10 mg Oral Q8H PRN Connye Burkitt, NP   10 mg at 07/06/20 1900   And  . LORazepam (ATIVAN) tablet 1 mg  1 mg Oral PRN Connye Burkitt, NP       And  . ziprasidone (GEODON) injection 20 mg  20 mg Intramuscular PRN Connye Burkitt, NP      . magnesium hydroxide (MILK OF MAGNESIA) suspension 30 mL  30 mL Oral Daily PRN Connye Burkitt, NP      . Oxcarbazepine (TRILEPTAL) tablet 300 mg  300 mg Oral BID Sharma Covert, MD   300 mg at 07/07/20 1009  . pantoprazole (PROTONIX) EC tablet 40 mg  40 mg Oral Daily Sharma Covert, MD   40 mg at 07/07/20 1009  . potassium chloride SA (KLOR-CON) CR tablet 20 mEq  20 mEq Oral BID Sharma Covert, MD   20 mEq  at 07/07/20 1009  . QUEtiapine (SEROQUEL) tablet 400 mg  400 mg Oral QHS Sharma Covert, MD      . topiramate (TOPAMAX) tablet 25 mg  25 mg Oral BID Sharma Covert, MD  25 mg at 07/07/20 1009  . traZODone (DESYREL) tablet 50 mg  50 mg Oral QHS PRN Connye Burkitt, NP        Lab Results:  Results for orders placed or performed during the hospital encounter of 07/05/20 (from the past 48 hour(s))  Respiratory Panel by RT PCR (Flu A&B, Covid) - Nasopharyngeal Swab     Status: None   Collection Time: 07/05/20 12:05 PM   Specimen: Nasopharyngeal Swab  Result Value Ref Range   SARS Coronavirus 2 by RT PCR NEGATIVE NEGATIVE    Comment: (NOTE) SARS-CoV-2 target nucleic acids are NOT DETECTED.  The SARS-CoV-2 RNA is generally detectable in upper respiratoy specimens during the acute phase of infection. The lowest concentration of SARS-CoV-2 viral copies this assay can detect is 131 copies/mL. A negative result does not preclude SARS-Cov-2 infection and should not be used as the sole basis for treatment or other patient management decisions. A negative result may occur with  improper specimen collection/handling, submission of specimen other than nasopharyngeal swab, presence of viral mutation(s) within the areas targeted by this assay, and inadequate number of viral copies (<131 copies/mL). A negative result must be combined with clinical observations, patient history, and epidemiological information. The expected result is Negative.  Fact Sheet for Patients:  PinkCheek.be  Fact Sheet for Healthcare Providers:  GravelBags.it  This test is no t yet approved or cleared by the [place] FDA and  has been authorized for detection and/or diagnosis of SARS-CoV-2 by FDA under an Emergency Use Authorization (EUA). This EUA will remain  in effect (meaning this test can be used) for the duration of the COVID-19 declaration under  Section 564(b)(1) of the Act, 21 U.S.C. section 360bbb-3(b)(1), unless the authorization is terminated or revoked sooner.     Influenza A by PCR NEGATIVE NEGATIVE   Influenza B by PCR NEGATIVE NEGATIVE    Comment: (NOTE) The Xpert Xpress SARS-CoV-2/FLU/RSV assay is intended as an aid in  the diagnosis of influenza from Nasopharyngeal swab specimens and  should not be used as a sole basis for treatment. Nasal washings and  aspirates are unacceptable for Xpert Xpress SARS-CoV-2/FLU/RSV  testing.  Fact Sheet for Patients: PinkCheek.be  Fact Sheet for Healthcare Providers: GravelBags.it  This test is not yet approved or cleared by the [place] FDA and  has been authorized for detection and/or diagnosis of SARS-CoV-2 by  FDA under an Emergency Use Authorization (EUA). This EUA will remain  in effect (meaning this test can be used) for the duration of the  Covid-19 declaration under Section 564(b)(1) of the Act, 21  U.S.C. section 360bbb-3(b)(1), unless the authorization is  terminated or revoked. Performed at [facility], [place] [address], [place], [place] [address]     Blood Alcohol level:  Lab Results  Component Value Date   ETH <10 07/05/2020   ETH <10 41/11/129    Metabolic Disorder Labs: Lab Results  Component Value Date   HGBA1C 5.2 01/10/2017   No results found for: PROLACTIN Lab Results  Component Value Date   CHOL 109 07/09/2017   TRIG 138 07/09/2017   HDL 44 07/09/2017   CHOLHDL 2.5 07/09/2017   VLDL 28 07/09/2017   LDLCALC 37 07/09/2017    Physical Findings: AIMS:  , ,  ,  ,    CIWA:    COWS:     Musculoskeletal: Strength & Muscle Tone: within normal limits Gait & Station: normal Patient leans: N/A  Psychiatric Specialty Exam:  Physical Exam Vitals and nursing note reviewed.  Constitutional:      Appearance: Normal appearance.  HENT:     Head: Normocephalic and atraumatic.   Pulmonary:     Effort: Pulmonary effort is normal.  Neurological:     General: No focal deficit present.     Mental Status: She is alert.     Review of Systems  All other systems reviewed and are negative.   Blood pressure 140/89, pulse 90, temperature 98.8 F (37.1 C), temperature source Oral, resp. rate 18, height 5' 10"  (1.778 m), weight 83.5 kg, SpO2 98 %, unknown if currently breastfeeding.Body mass index is 26.4 kg/m.  General Appearance: Casual  Eye Contact:  Minimal  Speech:  Normal Rate  Volume:  Decreased  Mood:  Anxious and Depressed  Affect:  Congruent  Thought Process:  Coherent and Descriptions of Associations: Circumstantial  Orientation:  Negative  Thought Content:  Delusions and Paranoid Ideation  Suicidal Thoughts:  No  Homicidal Thoughts:  No  Memory:  Immediate;   Fair Recent;   Fair Remote;   Fair  Judgement:  Intact  Insight:  Lacking  Psychomotor Activity:  Decreased  Concentration:  Concentration: Fair and Attention Span: Fair  Recall:  [employer] of Knowledge:  Fair  Language:  Fair  Akathisia:  Negative  Handed:  Right  AIMS (if indicated):     Assets:  Desire for Improvement Resilience  ADL's:  Intact  Cognition:  WNL  Sleep:  Number of Hours: 8.25     Treatment Plan Summary: Daily contact with patient to assess and evaluate symptoms and progress in treatment, Medication management and Plan : Patient is seen and examined.  Patient is a 29 year old female with a reported past psychiatric history as per above.   Diagnosis: 1.  Bipolar disorder, most recently mixed, severe with psychotic features 2.  Polysubstance dependence  Pertinent findings on examination today:  1. Nursing notes state that she appears paranoid and withdrawn. 2.  Patient still appears to be depressed. 3.  I have encouraged the patient to be compliant with treatment, and that we would be able to move her once she was able to demonstrate that she could control her  self and take her medications.  Plan: 1.  Buspirone 15 mg p.o. 3 times daily for anxiety. 2.  Fluoxetine 20 mg p.o. daily for depression and anxiety. 3.  Continue hydroxyzine 25 mg p.o. 3 times daily as needed anxiety. 4.  Continue Zyprexa agitation protocol as needed. 5.  Continue Seroquel 400 mg p.o. nightly for mood stability, psychosis and sleep. 6.  Continue Topamax 25 mg p.o. twice daily for mood stability and anxiety. 7.  Continue trazodone 50 mg p.o. nightly as needed insomnia. 8.  Continue Trileptal 300 mg p.o. twice daily for mood stability and anxiety. 9.  Apparently labs were not drawn this morning, and they will be reordered. 10.  Disposition planning-patient is requesting longer-term substance abuse treatment program.  Sharma Covert, MD 07/07/2020, 10:11 AM

## 2020-07-07 NOTE — Progress Notes (Signed)
Dar Note: Patient states that her mother had just dropped off her clothes in the lobby. Staff checked the locker in the lobby but could not find her clothing.Patient became verbally aggressive and abusive towards staff for not getting her clothes.  Patient threatened to spit at staff face.  Patient returned to [address] and Geodon 20 mg IM given for severe agitation with good effect.

## 2020-07-07 NOTE — Tx Team (Signed)
Interdisciplinary Treatment and Diagnostic Plan Update  07/07/2020 Time of Session: [place] MRN: [id number]  Principal Diagnosis: <principal problem not specified>  Secondary Diagnoses: Active Problems:   MDD (major depressive disorder)   Current Medications:  Current Facility-Administered Medications  Medication Dose Route Frequency Provider Last Rate Last Admin  . acetaminophen (TYLENOL) tablet 650 mg  650 mg Oral Q6H PRN Connye Burkitt, NP      . alum & mag hydroxide-simeth (MAALOX/MYLANTA) 200-200-20 MG/5ML suspension 30 mL  30 mL Oral Q4H PRN Connye Burkitt, NP      . busPIRone (BUSPAR) tablet 15 mg  15 mg Oral TID Sharma Covert, MD   15 mg at 07/07/20 1009  . FLUoxetine (PROZAC) capsule 20 mg  20 mg Oral Daily Sharma Covert, MD   20 mg at 07/07/20 1009  . hydrOXYzine (ATARAX/VISTARIL) tablet 25 mg  25 mg Oral TID PRN Connye Burkitt, NP   25 mg at 07/07/20 1011  . OLANZapine zydis (ZYPREXA) disintegrating tablet 10 mg  10 mg Oral Q8H PRN Connye Burkitt, NP   10 mg at 07/06/20 1900   And  . LORazepam (ATIVAN) tablet 1 mg  1 mg Oral PRN Connye Burkitt, NP       And  . ziprasidone (GEODON) injection 20 mg  20 mg Intramuscular PRN Connye Burkitt, NP      . magnesium hydroxide (MILK OF MAGNESIA) suspension 30 mL  30 mL Oral Daily PRN Connye Burkitt, NP      . Oxcarbazepine (TRILEPTAL) tablet 300 mg  300 mg Oral BID Sharma Covert, MD   300 mg at 07/07/20 1009  . pantoprazole (PROTONIX) EC tablet 40 mg  40 mg Oral Daily Sharma Covert, MD   40 mg at 07/07/20 1009  . potassium chloride SA (KLOR-CON) CR tablet 20 mEq  20 mEq Oral BID Sharma Covert, MD   20 mEq at 07/07/20 1009  . QUEtiapine (SEROQUEL) tablet 400 mg  400 mg Oral QHS Sharma Covert, MD      . topiramate (TOPAMAX) tablet 25 mg  25 mg Oral BID Sharma Covert, MD   25 mg at 07/07/20 1009  . traZODone (DESYREL) tablet 50 mg  50 mg Oral QHS PRN Connye Burkitt, NP       PTA  Medications: Medications Prior to Admission  Medication Sig Dispense Refill Last Dose  . busPIRone (BUSPAR) 15 MG tablet Take 15 mg by mouth 3 (three) times daily.     . [employer] MELATONIN 3 MG TABS tablet Take 6 mg by mouth at bedtime.     Marland Kitchen FLUoxetine (PROZAC) 10 MG capsule Take 10 mg by mouth at bedtime.     . hydrOXYzine (VISTARIL) 50 MG capsule Take 50 mg by mouth 3 (three) times daily as needed for anxiety.     . Oxcarbazepine (TRILEPTAL) 300 MG tablet Take 300 mg by mouth 2 (two) times daily.     . QUEtiapine (SEROQUEL) 400 MG tablet Take 400 mg by mouth at bedtime.      . topiramate (TOPAMAX) 25 MG tablet Take 25 mg by mouth 2 (two) times daily.       Patient Stressors: Financial difficulties Medication change or noncompliance Substance abuse Traumatic event  Patient Strengths: Capable of independent living Physical Health Supportive family/friends  Treatment Modalities: Medication Management, Group therapy, Case management,  1 to 1 session with clinician, Psychoeducation, Recreational therapy.   Physician  Treatment Plan for Primary Diagnosis: <principal problem not specified> Long Term Goal(s): Improvement in symptoms so as ready for discharge Improvement in symptoms so as ready for discharge   Short Term Goals: Ability to identify changes in lifestyle to reduce recurrence of condition will improve Ability to verbalize feelings will improve Ability to disclose and discuss suicidal ideas Ability to demonstrate self-control will improve Ability to identify and develop effective coping behaviors will improve Ability to maintain clinical measurements within normal limits will improve Ability to identify triggers associated with substance abuse/mental health issues will improve Ability to identify changes in lifestyle to reduce recurrence of condition will improve Ability to verbalize feelings will improve Ability to disclose and discuss suicidal ideas Ability to demonstrate  self-control will improve Ability to identify and develop effective coping behaviors will improve Ability to maintain clinical measurements within normal limits will improve Ability to identify triggers associated with substance abuse/mental health issues will improve  Medication Management: Evaluate patient's response, side effects, and tolerance of medication regimen.  Therapeutic Interventions: 1 to 1 sessions, Unit Group sessions and Medication administration.  Evaluation of Outcomes: Not Met  Physician Treatment Plan for Secondary Diagnosis: Active Problems:   MDD (major depressive disorder)  Long Term Goal(s): Improvement in symptoms so as ready for discharge Improvement in symptoms so as ready for discharge   Short Term Goals: Ability to identify changes in lifestyle to reduce recurrence of condition will improve Ability to verbalize feelings will improve Ability to disclose and discuss suicidal ideas Ability to demonstrate self-control will improve Ability to identify and develop effective coping behaviors will improve Ability to maintain clinical measurements within normal limits will improve Ability to identify triggers associated with substance abuse/mental health issues will improve Ability to identify changes in lifestyle to reduce recurrence of condition will improve Ability to verbalize feelings will improve Ability to disclose and discuss suicidal ideas Ability to demonstrate self-control will improve Ability to identify and develop effective coping behaviors will improve Ability to maintain clinical measurements within normal limits will improve Ability to identify triggers associated with substance abuse/mental health issues will improve     Medication Management: Evaluate patient's response, side effects, and tolerance of medication regimen.  Therapeutic Interventions: 1 to 1 sessions, Unit Group sessions and Medication administration.  Evaluation of Outcomes:  Not Met   RN Treatment Plan for Primary Diagnosis: <principal problem not specified> Long Term Goal(s): Knowledge of disease and therapeutic regimen to maintain health will improve  Short Term Goals: Ability to remain free from injury will improve, Ability to verbalize frustration and anger appropriately will improve, Ability to identify and develop effective coping behaviors will improve and Compliance with prescribed medications will improve  Medication Management: RN will administer medications as ordered by provider, will assess and evaluate patient's response and provide education to patient for prescribed medication. RN will report any adverse and/or side effects to prescribing provider.  Therapeutic Interventions: 1 on 1 counseling sessions, Psychoeducation, Medication administration, Evaluate responses to treatment, Monitor vital signs and CBGs as ordered, Perform/monitor CIWA, COWS, AIMS and Fall Risk screenings as ordered, Perform wound care treatments as ordered.  Evaluation of Outcomes: Not Met   LCSW Treatment Plan for Primary Diagnosis: <principal problem not specified> Long Term Goal(s): Safe transition to appropriate next level of care at discharge, Engage patient in therapeutic group addressing interpersonal concerns.  Short Term Goals: Engage patient in aftercare planning with referrals and resources, Facilitate acceptance of mental health diagnosis and concerns, Identify triggers associated with  mental health/substance abuse issues and Increase skills for wellness and recovery  Therapeutic Interventions: Assess for all discharge needs, 1 to 1 time with Social worker, Explore available resources and support systems, Assess for adequacy in community support network, Educate family and significant other(s) on suicide prevention, Complete Psychosocial Assessment, Interpersonal group therapy.  Evaluation of Outcomes: Not Met   Progress in Treatment: Attending groups:  No. Participating in groups: No. Taking medication as prescribed: No. Toleration medication: No. Family/Significant other contact made: No, will contact:  if consent is provided Patient understands diagnosis: No. Discussing patient identified problems/goals with staff: Yes. Medical problems stabilized or resolved: Yes. Denies suicidal/homicidal ideation: Yes. Issues/concerns per patient self-inventory: No.   New problem(s) identified: No, Describe:  none  New Short Term/Long Term Goal(s): medication stabilization, elimination of SI thoughts, development of comprehensive mental wellness plan.   Patient Goals:  "Getting ready to go home"  Discharge Plan or Barriers: Patient recently admitted. CSW will continue to follow and assess for appropriate referrals and possible discharge planning.   Reason for Continuation of Hospitalization: Delusions  Medication stabilization Withdrawal symptoms  Estimated Length of Stay: 3-5 days  Attendees: Patient:  07/07/2020  Physician: Harriett Sine, NP 07/07/2020   Nursing:  07/07/2020  RN Care Manager: 07/07/2020   Social Worker: Darletta Moll, LCSW 07/07/2020  Recreational Therapist:  07/07/2020   Other:  07/07/2020   Other:  07/07/2020  Other: 07/07/2020       Scribe for Treatment Team: Vassie Moselle, LCSW 07/07/2020 10:19 AM

## 2020-07-07 NOTE — [facility] Group Notes (Signed)
[place] LCSW Group Therapy  07/07/2020 2:56 PM   Type of Therapy and Topic: Group Therapy: Self- Care  Type of Therapy:  Psychoeducational Skills  Participation Level:  None  Participation Quality:  Drowsy and Inattentive  Affect:  Flat  Cognitive:  Lacking  Insight:  Limited  Engagement in Therapy:  None  Modes of Intervention:  Discussion and Education  Summary of Progress/Problems: This patient attended group, however did not participate in group discussion and appeared to be dosing off during group.   Britanie Harshman A Aaradhya Kysar 07/07/2020, 2:56 PM

## 2020-07-08 DIAGNOSIS — F1994 Other psychoactive substance use, unspecified with psychoactive substance-induced mood disorder: Secondary | ICD-10-CM | POA: Diagnosis not present

## 2020-07-08 MED ORDER — QUETIAPINE FUMARATE 400 MG PO TABS
400.0000 mg | ORAL_TABLET | Freq: Every day | ORAL | Status: DC
  Administered 2020-07-08 – 2020-07-11 (×4): 400 mg via ORAL
  Filled 2020-07-08 (×5): qty 1

## 2020-07-08 MED ORDER — HALOPERIDOL 5 MG PO TABS
10.0000 mg | ORAL_TABLET | Freq: Two times a day (BID) | ORAL | Status: DC | PRN
  Administered 2020-07-08: 10 mg via ORAL

## 2020-07-08 MED ORDER — HALOPERIDOL LACTATE 5 MG/ML IJ SOLN: 10.0000 mg | Freq: Two times a day (BID) | INTRAMUSCULAR | Status: DC | PRN

## 2020-07-08 NOTE — BHH Suicide Risk Assessment (Signed)
[place] INPATIENT:  Family/Significant Other Suicide Prevention Education  Suicide Prevention Education:  Education Completed; Latoya [name] [phone]), mother, has been identified by the patient as the family member/significant other with whom the patient will be residing, and identified as the person(s) who will aid the patient in the event of a mental health crisis (suicidal ideations/suicide attempt).  With written consent from the patient, the family member/significant other has been provided the following suicide prevention education, prior to the and/or following the discharge of the patient.  The suicide prevention education provided includes the following:  Suicide risk factors  Suicide prevention and interventions  National Suicide Hotline telephone number  [facility] assessment telephone number  [facility] Emergency Assistance [place] and/or Residential Mobile Crisis Unit telephone number  Request made of family/significant other to:  Remove weapons (e.g., guns, rifles, knives), all items previously/currently identified as safety concern.    Remove drugs/medications (over-the-counter, prescriptions, illicit drugs), all items previously/currently identified as a safety concern.  CSW spoke with Pt's mother, Latoya [name]. CSW provided mother with update regarding Pt's course of treatment. She is hopeful that Pt will transition to long term treatment at discharge. Mother was firm in that Pt does not have anywhere to live at discharge. She stated that Pt residing with her grandmother is not an option. Mother did confirm that Pt was discharged from [facility] in the middle of the night after disagreement with staff. Mother reports that if discharge, "she will be back".   The family member/significant other verbalizes understanding of the suicide prevention education information provided. The family member/significant other agrees to remove the items of  safety concern listed above.  Latoya Lewis [name], LCSW 07/08/2020, 2:47 PM

## 2020-07-08 NOTE — Progress Notes (Signed)
Minimal interaction on the unit this evening    07/08/20 2000  Psych Admission Type (Psych Patients Only)  Admission Status Involuntary  Psychosocial Assessment  Patient Complaints Anxiety  Eye Contact Brief  Facial Expression Anxious;Pensive;Worried  Affect Anxious;Preoccupied  Visual merchandiser Cooperative  Mood Anxious  Thought Process  Coherency WDL  Content WDL  Delusions None reported or observed  Perception WDL  Hallucination None reported or observed  Judgment Poor  Confusion None  Danger to Self  Current suicidal ideation? Denies  Danger to Others  Danger to Others None reported or observed
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
## 2020-07-08 NOTE — BHH Group Notes (Signed)
The focus of this group is to help patients establish daily goals to achieve during treatment and discuss how the patient can incorporate goal setting into their daily lives to aide in recovery.  Pt did not attend group

## 2020-07-08 NOTE — Plan of Care (Signed)
Progress note  D: pt found in bed; pt allowed to rest. Upon awakening, pt was compliant with morning medications. Pt denies any physical complaints or pain. Pt has been viewed in the dayroom, interacting appropriately with peers. Pt is pleasant at this time. Pt denies si/hi/ah/vh and verbally agrees to approach staff if these become apparent or before harming themself/others while at [facility].  A: Pt provided support and encouragement. Pt given medication per protocol and standing orders. Q33msafety checks implemented and continued.  R: Pt safe on the unit. Will continue to monitor.  Pt progressing in the following metrics   Problem: Education: Goal: Knowledge of [facility] Education information/materials will improve Outcome: Progressing Goal: Emotional status will improve Outcome: Progressing Goal: Mental status will improve Outcome: Progressing Goal: Verbalization of understanding the information provided will improve Outcome: Progressing

## 2020-07-08 NOTE — Progress Notes (Signed)
[facility] MD Progress Note  07/08/2020 12:24 PM Chrisa Hassan  MRN:  [id number] Subjective: Patient is a 29 year old female with a reported past psychiatric history significant for crack cocaine, heroin and recent overdose of this in addition to Seroquel to kill her self.  She has a reported history as well of bipolar disorder.  Objective: Patient is seen and examined.  Patient is a 29 year old female with the above-stated past psychiatric history who is seen in follow-up.  We had attempted to transfer her to the [address], but once she arrived on the [place] she was profane and agitated.  She had to be transferred back to the [place].  She received intramuscular medication in an attempt to calm her.  Also her Seroquel was stopped, she was placed on Haldol 10 mg p.o. twice daily, and as well her Trileptal was increased to 450 mg p.o. twice daily.  This morning she is more polite and interactive.  She stated that she just wants to be discharged.  She originally states that she wants to go stay with her grandparents, and I told her that would be fine but that we would need to talk with her grandparents to make sure it would be okay for her to go there.  I also mentioned that her goal on being admitted was to get into a long-term substance abuse treatment program.  She is not sure she really wants to do that.  She denied any auditory or visual hallucinations.  She denied any suicidal or homicidal ideation.  Her main request was to go back on the Seroquel from the Haldol.  Her vital signs are stable, she is afebrile.  She slept 6.5 hours last night.  No new laboratories.  Principal Problem: <principal problem not specified> Diagnosis: Active Problems:   MDD (major depressive disorder)  Total Time spent with patient: 20 minutes  Past Psychiatric History: See admission H&P  Past Medical History:  Past Medical History:  Diagnosis Date  . Anemia   . Anxiety   . Arthritis   . Blood transfusion without  reported diagnosis   . Crohn's disease ([place])   . Depression   . Drug-seeking behavior   . Gestational diabetes   . History of substance abuse ([place])   . HSV infection   . Pregnancy induced hypertension   . Substance abuse [facility])     Past Surgical History:  Procedure Laterality Date  . CHOLECYSTECTOMY    . COLONOSCOPY N/A 07/07/2017   Procedure: COLONOSCOPY;  Surgeon: Juanita Craver, MD;  Location: [facility] ENDOSCOPY;  Service: Endoscopy;  Laterality: N/A;  . DEEP NECK LYMPH NODE BIOPSY / EXCISION  2011   Family History:  Family History  Problem Relation Age of Onset  . Diabetes Mother   . Hypertension Mother   . Hypertension Father   . Stroke Maternal Grandfather   . Colon cancer Neg Hx   . Stomach cancer Neg Hx    Family Psychiatric  History: See admission H&P Social History:  Social History   Substance and Sexual Activity  Alcohol Use No   Comment: "not for a while"     Social History   Substance and Sexual Activity  Drug Use Yes  . Types: "Crack" cocaine, Cocaine   Comment: cocai, former user, 06/27/2017    Social History   Socioeconomic History  . Marital status: Single    Spouse name: Not on file  . Number of children: 2  . Years of education: Not on file  .  Highest education level: Not on file  Occupational History  . Not on file  Tobacco Use  . Smoking status: Never Smoker  . Smokeless tobacco: Never Used  Vaping Use  . Vaping Use: Never used  Substance and Sexual Activity  . Alcohol use: No    Comment: "not for a while"  . Drug use: Yes    Types: "Crack" cocaine, Cocaine    Comment: cocai, former user, 06/27/2017  . Sexual activity: Not Currently    Partners: Male    Birth control/protection: None  Other Topics Concern  . Not on file  Social History Narrative  . Not on file   Social Determinants of Health   Financial Resource Strain:   . Difficulty of Paying Living Expenses: Not on file  Food Insecurity:   . Worried About Charity fundraiser in  the Last Year: Not on file  . Ran Out of Food in the Last Year: Not on file  Transportation Needs:   . Lack of Transportation (Medical): Not on file  . Lack of Transportation (Non-Medical): Not on file  Physical Activity:   . Days of Exercise per Week: Not on file  . Minutes of Exercise per Session: Not on file  Stress:   . Feeling of Stress : Not on file  Social Connections:   . Frequency of Communication with Friends and Family: Not on file  . Frequency of Social Gatherings with Friends and Family: Not on file  . Attends Religious Services: Not on file  . Active Member of Clubs or Organizations: Not on file  . Attends Archivist Meetings: Not on file  . Marital Status: Not on file   Additional Social History:                         Sleep: Good  Appetite:  Good  Current Medications: Current Facility-Administered Medications  Medication Dose Route Frequency Provider Last Rate Last Admin  . acetaminophen (TYLENOL) tablet 650 mg  650 mg Oral Q6H PRN Connye Burkitt, NP      . alum & mag hydroxide-simeth (MAALOX/MYLANTA) 200-200-20 MG/5ML suspension 30 mL  30 mL Oral Q4H PRN Connye Burkitt, NP      . busPIRone (BUSPAR) tablet 15 mg  15 mg Oral TID Sharma Covert, MD   15 mg at 07/08/20 1137  . FLUoxetine (PROZAC) capsule 20 mg  20 mg Oral Daily Sharma Covert, MD   20 mg at 07/08/20 1137  . haloperidol (HALDOL) tablet 10 mg  10 mg Oral BID Sharma Covert, MD   10 mg at 07/08/20 1137   Or  . haloperidol lactate (HALDOL) injection 10 mg  10 mg Intramuscular BID Sharma Covert, MD      . hydrOXYzine (ATARAX/VISTARIL) tablet 25 mg  25 mg Oral TID PRN Connye Burkitt, NP   25 mg at 07/07/20 1011  . OLANZapine zydis (ZYPREXA) disintegrating tablet 10 mg  10 mg Oral Q8H PRN Connye Burkitt, NP   10 mg at 07/06/20 1900   And  . LORazepam (ATIVAN) tablet 1 mg  1 mg Oral PRN Connye Burkitt, NP       And  . ziprasidone (GEODON) injection 20 mg  20 mg  Intramuscular PRN Connye Burkitt, NP      . magnesium hydroxide (MILK OF MAGNESIA) suspension 30 mL  30 mL Oral Daily PRN Connye Burkitt, NP      .  OXcarbazepine (TRILEPTAL) tablet 450 mg  450 mg Oral BID Sharma Covert, MD   450 mg at 07/08/20 1137  . pantoprazole (PROTONIX) EC tablet 40 mg  40 mg Oral Daily Sharma Covert, MD   40 mg at 07/08/20 1138  . topiramate (TOPAMAX) tablet 25 mg  25 mg Oral BID Sharma Covert, MD   25 mg at 07/08/20 1137  . traZODone (DESYREL) tablet 50 mg  50 mg Oral QHS PRN Connye Burkitt, NP   50 mg at 07/07/20 2058    Lab Results: No results found for this or any previous visit (from the past 46 hour(s)).  Blood Alcohol level:  Lab Results  Component Value Date   ETH <10 07/05/2020   ETH <10 55/73/2202    Metabolic Disorder Labs: Lab Results  Component Value Date   HGBA1C 5.2 01/10/2017   No results found for: PROLACTIN Lab Results  Component Value Date   CHOL 109 07/09/2017   TRIG 138 07/09/2017   HDL 44 07/09/2017   CHOLHDL 2.5 07/09/2017   VLDL 28 07/09/2017   LDLCALC 37 07/09/2017    Physical Findings: AIMS:  , ,  ,  ,    CIWA:    COWS:     Musculoskeletal: Strength & Muscle Tone: within normal limits Gait & Station: normal Patient leans: N/A  Psychiatric Specialty Exam: Physical Exam Vitals and nursing note reviewed.  Constitutional:      Appearance: Normal appearance.  HENT:     Head: Normocephalic and atraumatic.  Pulmonary:     Effort: Pulmonary effort is normal.  Neurological:     General: No focal deficit present.     Mental Status: She is alert and oriented to person, place, and time.     Review of Systems  Blood pressure 140/89, pulse 90, temperature 98.8 F (37.1 C), temperature source Oral, resp. rate 18, height 5' 10"  (1.778 m), weight 83.5 kg, SpO2 98 %, unknown if currently breastfeeding.Body mass index is 26.4 kg/m.  General Appearance: Casual  Eye Contact:  Fair  Speech:  Normal Rate  Volume:   Decreased  Mood:  Irritable  Affect:  Congruent  Thought Process:  Coherent and Descriptions of Associations: Circumstantial  Orientation:  Full (Time, Place, and Person)  Thought Content:  Negative  Suicidal Thoughts:  No  Homicidal Thoughts:  No  Memory:  Immediate;   Fair Recent;   Fair Remote;   Fair  Judgement:  Intact  Insight:  Lacking  Psychomotor Activity:  Normal  Concentration:  Concentration: Good and Attention Span: Good  Recall:  Good  Fund of Knowledge:  Good  Language:  Good  Akathisia:  Negative  Handed:  Right  AIMS (if indicated):     Assets:  Desire for Improvement Resilience  ADL's:  Intact  Cognition:  WNL  Sleep:  Number of Hours: 6.5     Treatment Plan Summary: Daily contact with patient to assess and evaluate symptoms and progress in treatment, Medication management and Plan : Patient is seen and examined.  Patient is a 29 year old female with the above-stated past psychiatric history who is seen in follow-up.   Diagnosis: 1.  Bipolar disorder; most recently mixed, severe with psychotic features 2.  Polysubstance dependence.   Pertinent findings on examination today: 1.  Paranoia is decreased. 2.  Irritability has decreased. 3.  Slept well. 4.  Patient request to go back on Seroquel. 5.  Patient is now requesting discharge to go stay with her  grandparents.  Plan: 1.  Continue Buspirone 15 mg p.o. 3 times daily for anxiety. 2.  Continue Fluoxetine 20 mg p.o. daily for depression and anxiety. 3.  Continue hydroxyzine 25 mg p.o. 3 times daily as needed anxiety. 4.  Continue Zyprexa agitation protocol as needed. 5.  Restart Seroquel 400 mg p.o. nightly for mood stability, psychosis and sleep. 6.  Continue Topamax 25 mg p.o. twice daily for mood stability and anxiety. 7.  Continue trazodone 50 mg p.o. nightly as needed insomnia. 8.  Continue increased Trileptal 450 mg p.o. twice daily for mood stability and anxiety. 9.  Laboratories have still  not been drawn, and we will rerequest those. 10.  We will attempt to contact grandparents to see if they would be willing to allow her to come to their dwelling. 65.  Social work will discuss with patient options for long-term substance abuse treatment, but it should be noted that she left the previous facility after 2 weeks. 12.  Disposition planning-in progress.  Sharma Covert, MD 07/08/2020, 12:24 PM

## 2020-07-09 DIAGNOSIS — F331 Major depressive disorder, recurrent, moderate: Secondary | ICD-10-CM | POA: Diagnosis not present

## 2020-07-09 DIAGNOSIS — F1910 Other psychoactive substance abuse, uncomplicated: Secondary | ICD-10-CM | POA: Diagnosis not present

## 2020-07-09 NOTE — B[facility] Counselor (Signed)
FOLLOW-UP: CSW called [facility], [facility] and [facility] to check on the status of the referrals.\ CSW called [facility] 07/09/20 2:10pm and learned that the pt has been waitlisted. CSW called [facility] 07/09/20 2:19pm and learned that Ms. June from admission was out today and CSW will need to follow-up Monday. CSW called [facility] 07/09/20 2:21pm and learned that the pt has been accepted but will need to call back for an admission date and time.  CSW will continue to follow up with [facility] for admission date and time.   Toney Reil, [place] Worker [employer]

## 2020-07-09 NOTE — Progress Notes (Signed)
[facility] MD Progress Note  07/09/2020 9:23 AM Latoya [name]  MRN:  [id number] Subjective: Patient is a 29 year old female with a reported past psychiatric history significant for crack cocaine, heroin and recent overdose of this in addition to Seroquel to kill her self.  She has a reported history as well of bipolar disorder.  Objective: Patient is seen and examined.  Patient is a 29 year old female with the above-stated past psychiatric history who is seen in follow-up.  Today, patient expresses frustration that she is not being discharged.  She states that she does not think that she will get accepted into a substance use rehabilitation program.  She is unable to be attentive to discharge planning and feels that she will likely fail and return to using, and/or be readmitted to a psychiatric hospital for substance abuse or overdose.  She reports she is tolerating her medicines without side effects.  She has not been interacting in the milieu, and isolates to her room.  Sleep has improved, but this is likely more reflective of her ongoing depression.  She denies SI, HI, AVH today with a flat affect. Her vital signs are stable, she is afebrile.  She slept 8 .5 hours last night.  No new laboratories.  Principal Problem: MDD (major depressive disorder) Diagnosis: Principal Problem:   MDD (major depressive disorder)  Total Time spent with patient: 25 minutes  Past Psychiatric History: See admission H&P  Past Medical History:  Past Medical History:  Diagnosis Date  . Anemia   . Anxiety   . Arthritis   . Blood transfusion without reported diagnosis   . Crohn's disease ([place])   . Depression   . Drug-seeking behavior   . Gestational diabetes   . History of substance abuse ([place])   . HSV infection   . Pregnancy induced hypertension   . Substance abuse [facility])     Past Surgical History:  Procedure Laterality Date  . CHOLECYSTECTOMY    . COLONOSCOPY N/A 07/07/2017   Procedure: COLONOSCOPY;   Surgeon: Juanita Craver, MD;  Location: [facility] ENDOSCOPY;  Service: Endoscopy;  Laterality: N/A;  . DEEP NECK LYMPH NODE BIOPSY / EXCISION  2011   Family History:  Family History  Problem Relation Age of Onset  . Diabetes Mother   . Hypertension Mother   . Hypertension Father   . Stroke Maternal Grandfather   . Colon cancer Neg Hx   . Stomach cancer Neg Hx    Family Psychiatric  History: See admission H&P Social History:  Social History   Substance and Sexual Activity  Alcohol Use No   Comment: "not for a while"     Social History   Substance and Sexual Activity  Drug Use Yes  . Types: "Crack" cocaine, Cocaine   Comment: cocai, former user, 06/27/2017    Social History   Socioeconomic History  . Marital status: Single    Spouse name: Not on file  . Number of children: 2  . Years of education: Not on file  . Highest education level: Not on file  Occupational History  . Not on file  Tobacco Use  . Smoking status: Never Smoker  . Smokeless tobacco: Never Used  Vaping Use  . Vaping Use: Never used  Substance and Sexual Activity  . Alcohol use: No    Comment: "not for a while"  . Drug use: Yes    Types: "Crack" cocaine, Cocaine    Comment: cocai, former user, 06/27/2017  . Sexual activity: Not Currently  Partners: Male    Birth control/protection: None  Other Topics Concern  . Not on file  Social History Narrative  . Not on file   Social Determinants of Health   Financial Resource Strain:   . Difficulty of Paying Living Expenses: Not on file  Food Insecurity:   . Worried About Charity fundraiser in the Last Year: Not on file  . Ran Out of Food in the Last Year: Not on file  Transportation Needs:   . Lack of Transportation (Medical): Not on file  . Lack of Transportation (Non-Medical): Not on file  Physical Activity:   . Days of Exercise per Week: Not on file  . Minutes of Exercise per Session: Not on file  Stress:   . Feeling of Stress : Not on file   Social Connections:   . Frequency of Communication with Friends and Family: Not on file  . Frequency of Social Gatherings with Friends and Family: Not on file  . Attends Religious Services: Not on file  . Active Member of Clubs or Organizations: Not on file  . Attends Archivist Meetings: Not on file  . Marital Status: Not on file   Additional Social History:                         Sleep: Good  Appetite:  Good  Current Medications: Current Facility-Administered Medications  Medication Dose Route Frequency Provider Last Rate Last Admin  . acetaminophen (TYLENOL) tablet 650 mg  650 mg Oral Q6H PRN Connye Burkitt, NP      . alum & mag hydroxide-simeth (MAALOX/MYLANTA) 200-200-20 MG/5ML suspension 30 mL  30 mL Oral Q4H PRN Connye Burkitt, NP      . busPIRone (BUSPAR) tablet 15 mg  15 mg Oral TID Sharma Covert, MD   15 mg at 07/08/20 2048  . FLUoxetine (PROZAC) capsule 20 mg  20 mg Oral Daily Sharma Covert, MD   20 mg at 07/08/20 1137  . haloperidol (HALDOL) tablet 10 mg  10 mg Oral BID PRN Sharma Covert, MD   10 mg at 07/08/20 1642   Or  . haloperidol lactate (HALDOL) injection 10 mg  10 mg Intramuscular BID PRN Sharma Covert, MD      . hydrOXYzine (ATARAX/VISTARIL) tablet 25 mg  25 mg Oral TID PRN Connye Burkitt, NP   25 mg at 07/07/20 1011  . OLANZapine zydis (ZYPREXA) disintegrating tablet 10 mg  10 mg Oral Q8H PRN Connye Burkitt, NP   10 mg at 07/06/20 1900   And  . LORazepam (ATIVAN) tablet 1 mg  1 mg Oral PRN Connye Burkitt, NP       And  . ziprasidone (GEODON) injection 20 mg  20 mg Intramuscular PRN Connye Burkitt, NP      . magnesium hydroxide (MILK OF MAGNESIA) suspension 30 mL  30 mL Oral Daily PRN Connye Burkitt, NP      . OXcarbazepine (TRILEPTAL) tablet 450 mg  450 mg Oral BID Sharma Covert, MD   450 mg at 07/08/20 1641  . pantoprazole (PROTONIX) EC tablet 40 mg  40 mg Oral Daily Sharma Covert, MD   40 mg at 07/08/20 1138   . QUEtiapine (SEROQUEL) tablet 400 mg  400 mg Oral QHS Sharma Covert, MD   400 mg at 07/08/20 2048  . topiramate (TOPAMAX) tablet 25 mg  25 mg Oral BID  Sharma Covert, MD   25 mg at 07/08/20 1641  . traZODone (DESYREL) tablet 50 mg  50 mg Oral QHS PRN Connye Burkitt, NP   50 mg at 07/07/20 2058    Lab Results: No results found for this or any previous visit (from the past 24 hour(s)).  Blood Alcohol level:  Lab Results  Component Value Date   ETH <10 07/05/2020   ETH <10 22/48/2500    Metabolic Disorder Labs: Lab Results  Component Value Date   HGBA1C 5.2 01/10/2017   No results found for: PROLACTIN Lab Results  Component Value Date   CHOL 109 07/09/2017   TRIG 138 07/09/2017   HDL 44 07/09/2017   CHOLHDL 2.5 07/09/2017   VLDL 28 07/09/2017   LDLCALC 37 07/09/2017    Physical Findings: AIMS: Facial and Oral Movements Muscles of Facial Expression: None, normal Lips and Perioral Area: None, normal Jaw: None, normal Tongue: None, normal,Extremity Movements Upper (arms, wrists, hands, fingers): None, normal Lower (legs, knees, ankles, toes): None, normal, Trunk Movements Neck, shoulders, hips: None, normal, Overall Severity Severity of abnormal movements (highest score from questions above): None, normal Incapacitation due to abnormal movements: None, normal Patient's awareness of abnormal movements (rate only patient's report): No Awareness, Dental Status Current problems with teeth and/or dentures?: No Does patient usually wear dentures?: No  CIWA:    COWS:     Musculoskeletal: Strength & Muscle Tone: within normal limits Gait & Station: normal Patient leans: N/A  Psychiatric Specialty Exam: Physical Exam Vitals and nursing note reviewed.  Constitutional:      Appearance: Normal appearance.  HENT:     Head: Normocephalic and atraumatic.  Pulmonary:     Effort: Pulmonary effort is normal.  Neurological:     General: No focal deficit present.      Mental Status: She is alert and oriented to person, place, and time.     Review of Systems  Blood pressure 140/89, pulse 90, temperature 98.8 F (37.1 C), temperature source Oral, resp. rate 18, height 5' 10"  (1.778 m), weight 83.5 kg, SpO2 98 %, unknown if currently breastfeeding.Body mass index is 26.4 kg/m.  General Appearance: Casual  Eye Contact:  Minimal  Speech:  Normal Rate  Volume:  Decreased  Mood:  Depressed and Irritable  Affect:  Congruent  Thought Process:  Coherent and Descriptions of Associations: Circumstantial  Orientation:  Full (Time, Place, and Person)  Thought Content:  Negative  Suicidal Thoughts:  No  Homicidal Thoughts:  No  Memory:  Immediate;   Fair Recent;   Fair Remote;   Fair  Judgement:  Intact  Insight:  Lacking  Psychomotor Activity:  Normal  Concentration:  Concentration: Good and Attention Span: Good  Recall:  Good  Fund of Knowledge:  Good  Language:  Good  Akathisia:  Negative  Handed:  Right  AIMS (if indicated):     Assets:  Desire for Improvement Resilience  ADL's:  Intact  Cognition:  WNL  Sleep:  Number of Hours: 8.5     Treatment Plan Summary: Daily contact with patient to assess and evaluate symptoms and progress in treatment, Medication management and Plan : Patient is seen and examined.  Patient is a 29 year old female with the above-stated past psychiatric history who is seen in follow-up.   Diagnosis: 1.  Bipolar disorder; most recently mixed, severe with psychotic features 2.  Polysubstance dependence.   Pertinent findings on examination today: 1.  Paranoia is decreased. 2.  Irritability has  decreased. 3.  Slept well. 4.  Patient request to go back on Seroquel. 5.  Patient is now requesting discharge to go stay with her grandparents.  Plan: 1.  Continue Buspirone 15 mg p.o. 3 times daily for anxiety. 2.  Continue Fluoxetine 20 mg p.o. daily for depression and anxiety. 3.  Continue hydroxyzine 25 mg p.o. 3 times  daily as needed anxiety. 4.  Continue Zyprexa agitation protocol as needed. 5.  Continue Seroquel 400 mg p.o. nightly for mood stability, psychosis and sleep. 6.  Continue Topamax 25 mg p.o. twice daily for mood stability and anxiety. 7.  Continue trazodone 50 mg p.o. nightly as needed insomnia. 8.  Continue increased Trileptal 450 mg p.o. twice daily for mood stability and anxiety. 9.  Laboratories reviewed: Magnesium normal, hCG negative, CBC with H/H 10.9/34.2, CMP with mildly low potassium 3.4, Tylenol, salicylate, and ethanol levels negative, and urine drug screen was positive for cocaine and benzodiazepines 3 weeks ago.  She has not provided a new urine drug screen. 10. Social work will discuss with patient options for long-term substance abuse treatment, but it should be noted that she left the previous facility after 2 weeks. 11.patient has been accepted to [facility] for 10/18 at 1 PM 12.  Disposition planning-in progress.  Lavella Hammock, MD 07/09/2020, 9:23 AM

## 2020-07-09 NOTE — Progress Notes (Signed)
   07/09/20 1300  Psych Admission Type (Psych Patients Only)  Admission Status Involuntary  Psychosocial Assessment  Patient Complaints Irritability  Eye Contact Brief  Facial Expression Anxious;Pensive;Worried  Affect Anxious;Preoccupied  Visual merchandiser Cooperative  Mood Labile  Thought Process  Coherency WDL  Content WDL  Delusions None reported or observed  Perception WDL  Hallucination None reported or observed  Judgment Poor  Confusion None  Danger to Self  Current suicidal ideation? Denies  Danger to Others  Danger to Others None reported or observed

## 2020-07-09 NOTE — Progress Notes (Signed)
Pt kept to self this evening, minimal interaction on the milieu    07/09/20 2000  Psych Admission Type (Psych Patients Only)  Admission Status Involuntary  Psychosocial Assessment  Patient Complaints Anxiety  Eye Contact Brief  Facial Expression Anxious;Pensive;Worried  Affect Anxious;Preoccupied  Visual merchandiser Guarded  Mood Labile  Thought Process  Coherency WDL  Content WDL  Delusions None reported or observed  Perception WDL  Hallucination None reported or observed  Judgment Poor  Confusion None  Danger to Self  Current suicidal ideation? Denies  Danger to Others  Danger to Others None reported or observed

## 2020-07-09 NOTE — BHH Group Notes (Signed)
The focus of this group is to help patients establish daily goals to achieve during treatment and discuss how the patient can incorporate goal setting into their daily lives to aide in recovery.  Pt did not attend group

## 2020-07-10 DIAGNOSIS — F1110 Opioid abuse, uncomplicated: Secondary | ICD-10-CM | POA: Diagnosis present

## 2020-07-10 DIAGNOSIS — F1910 Other psychoactive substance abuse, uncomplicated: Secondary | ICD-10-CM | POA: Diagnosis not present

## 2020-07-10 DIAGNOSIS — F1410 Cocaine abuse, uncomplicated: Secondary | ICD-10-CM

## 2020-07-10 DIAGNOSIS — F0634 Mood disorder due to known physiological condition with mixed features: Secondary | ICD-10-CM | POA: Diagnosis present

## 2020-07-10 MED ORDER — CLONIDINE HCL 0.1 MG PO TABS
0.1000 mg | ORAL_TABLET | Freq: Three times a day (TID) | ORAL | Status: DC | PRN
  Administered 2020-07-10 – 2020-07-11 (×2): 0.1 mg via ORAL
  Filled 2020-07-10 (×2): qty 1

## 2020-07-10 NOTE — Progress Notes (Signed)
[facility] MD Progress Note  07/10/2020 5:22 PM Latoya [name]  MRN:  [id number] Subjective: Patient is a 29 year old female with a reported past psychiatric history significant for crack cocaine, heroin and recent overdose of this in addition to Seroquel to kill her self.  She has a reported history as well of bipolar disorder.  Objective: Patient is seen and examined.  Patient is a 29 year old female with the above-stated past psychiatric history who is seen in follow-up.   Today, patient appears to be in improved mood.  She is dressed and visible in the milieu.  She is anxious about starting treatment at [facility].she requests discharge on Sunday in order to be able to see her children and allow her mother to take her to be admitted to the [facility] program.  Patient admits that she is craving opiates, but believes that her mother would be able to keep her from using. A family meeting is done by phone with patient's mother, Seth Bake. Mother expresses concern that should patient be discharged, she will likely relapse and not make it to [facility], noting that they have attempted this in the past with other substance treatment programs.  Time is allowed for patient to have a visit with her children (7-year-old son and 76-year-old daughter) and mother by phone.  Will provide medication for patient to help with cravings and some abdominal cramping.  She reports she is tolerating her medicines without side effects.  She has had no behavioral issues.  Sleep and appetite have improved.  She denies SI, HI, AVH today. Her vital signs are stable, she is afebrile.  She slept 7 hours last night.  No new laboratories.  Principal Problem: Bipolar and related disorder due to another medical condition with mixed features Diagnosis: Principal Problem:   Bipolar and related disorder due to another medical condition with mixed features Active Problems:   Cocaine abuse ([place])   Heroin abuse ([place])  Total Time spent with patient: 45  minutes  Past Psychiatric History: See admission H&P  Past Medical History:  Past Medical History:  Diagnosis Date  . Anemia   . Anxiety   . Arthritis   . Blood transfusion without reported diagnosis   . Crohn's disease ([place])   . Depression   . Drug-seeking behavior   . Gestational diabetes   . History of substance abuse ([place])   . HSV infection   . Pregnancy induced hypertension   . Substance abuse [facility])     Past Surgical History:  Procedure Laterality Date  . CHOLECYSTECTOMY    . COLONOSCOPY N/A 07/07/2017   Procedure: COLONOSCOPY;  Surgeon: Juanita Craver, MD;  Location: [facility] ENDOSCOPY;  Service: Endoscopy;  Laterality: N/A;  . DEEP NECK LYMPH NODE BIOPSY / EXCISION  2011   Family History:  Family History  Problem Relation Age of Onset  . Diabetes Mother   . Hypertension Mother   . Hypertension Father   . Stroke Maternal Grandfather   . Colon cancer Neg Hx   . Stomach cancer Neg Hx    Family Psychiatric  History: See admission H&P  Social History:  Social History   Substance and Sexual Activity  Alcohol Use No   Comment: "not for a while"     Social History   Substance and Sexual Activity  Drug Use Yes  . Types: "Crack" cocaine, Cocaine   Comment: cocai, former user, 06/27/2017    Social History   Socioeconomic History  . Marital status: Single    Spouse name: Not  on file  . Number of children: 2  . Years of education: Not on file  . Highest education level: Not on file  Occupational History  . Not on file  Tobacco Use  . Smoking status: Never Smoker  . Smokeless tobacco: Never Used  Vaping Use  . Vaping Use: Never used  Substance and Sexual Activity  . Alcohol use: No    Comment: "not for a while"  . Drug use: Yes    Types: "Crack" cocaine, Cocaine    Comment: cocai, former user, 06/27/2017  . Sexual activity: Not Currently    Partners: Male    Birth control/protection: None  Other Topics Concern  . Not on file  Social History Narrative  .  Not on file   Social Determinants of Health   Financial Resource Strain:   . Difficulty of Paying Living Expenses: Not on file  Food Insecurity:   . Worried About Charity fundraiser in the Last Year: Not on file  . Ran Out of Food in the Last Year: Not on file  Transportation Needs:   . Lack of Transportation (Medical): Not on file  . Lack of Transportation (Non-Medical): Not on file  Physical Activity:   . Days of Exercise per Week: Not on file  . Minutes of Exercise per Session: Not on file  Stress:   . Feeling of Stress : Not on file  Social Connections:   . Frequency of Communication with Friends and Family: Not on file  . Frequency of Social Gatherings with Friends and Family: Not on file  . Attends Religious Services: Not on file  . Active Member of Clubs or Organizations: Not on file  . Attends Archivist Meetings: Not on file  . Marital Status: Not on file   Additional Social History:             Has been living off and on with mother while attempting to do substance use rehabilitation [facility] [place].            Sleep: Good  Appetite:  Good  Current Medications: Current Facility-Administered Medications  Medication Dose Route Frequency Provider Last Rate Last Admin  . acetaminophen (TYLENOL) tablet 650 mg  650 mg Oral Q6H PRN Connye Burkitt, NP      . alum & mag hydroxide-simeth (MAALOX/MYLANTA) 200-200-20 MG/5ML suspension 30 mL  30 mL Oral Q4H PRN Connye Burkitt, NP      . busPIRone (BUSPAR) tablet 15 mg  15 mg Oral TID Sharma Covert, MD   15 mg at 07/10/20 1709  . cloNIDine (CATAPRES) tablet 0.1 mg  0.1 mg Oral TID PRN Lavella Hammock, MD      . FLUoxetine (PROZAC) capsule 20 mg  20 mg Oral Daily Sharma Covert, MD   20 mg at 07/10/20 1031  . haloperidol (HALDOL) tablet 10 mg  10 mg Oral BID PRN Sharma Covert, MD   10 mg at 07/08/20 1642   Or  . haloperidol lactate (HALDOL) injection 10 mg  10 mg Intramuscular BID PRN  Sharma Covert, MD      . hydrOXYzine (ATARAX/VISTARIL) tablet 25 mg  25 mg Oral TID PRN Connye Burkitt, NP   25 mg at 07/10/20 1607  . magnesium hydroxide (MILK OF MAGNESIA) suspension 30 mL  30 mL Oral Daily PRN Connye Burkitt, NP      . OLANZapine zydis (ZYPREXA) disintegrating tablet 10 mg  10 mg  Oral Q8H PRN Connye Burkitt, NP   10 mg at 07/06/20 1900   And  . ziprasidone (GEODON) injection 20 mg  20 mg Intramuscular PRN Connye Burkitt, NP      . OXcarbazepine (TRILEPTAL) tablet 450 mg  450 mg Oral BID Sharma Covert, MD   450 mg at 07/10/20 1710  . pantoprazole (PROTONIX) EC tablet 40 mg  40 mg Oral Daily Sharma Covert, MD   40 mg at 07/10/20 1024  . QUEtiapine (SEROQUEL) tablet 400 mg  400 mg Oral QHS Sharma Covert, MD   400 mg at 07/09/20 2103  . topiramate (TOPAMAX) tablet 25 mg  25 mg Oral BID Sharma Covert, MD   25 mg at 07/10/20 1710  . traZODone (DESYREL) tablet 50 mg  50 mg Oral QHS PRN Connye Burkitt, NP   50 mg at 07/07/20 2058    Lab Results: No results found for this or any previous visit (from the past 8 hour(s)).  Blood Alcohol level:  Lab Results  Component Value Date   ETH <10 07/05/2020   ETH <10 07/86/7544    Metabolic Disorder Labs: Lab Results  Component Value Date   HGBA1C 5.2 01/10/2017   No results found for: PROLACTIN Lab Results  Component Value Date   CHOL 109 07/09/2017   TRIG 138 07/09/2017   HDL 44 07/09/2017   CHOLHDL 2.5 07/09/2017   VLDL 28 07/09/2017   LDLCALC 37 07/09/2017    Physical Findings: AIMS: Facial and Oral Movements Muscles of Facial Expression: None, normal Lips and Perioral Area: None, normal Jaw: None, normal Tongue: None, normal,Extremity Movements Upper (arms, wrists, hands, fingers): None, normal Lower (legs, knees, ankles, toes): None, normal, Trunk Movements Neck, shoulders, hips: None, normal, Overall Severity Severity of abnormal movements (highest score from questions above): None,  normal Incapacitation due to abnormal movements: None, normal Patient's awareness of abnormal movements (rate only patient's report): No Awareness, Dental Status Current problems with teeth and/or dentures?: No Does patient usually wear dentures?: No  CIWA:    COWS:     Musculoskeletal: Strength & Muscle Tone: within normal limits Gait & Station: normal Patient leans: N/A  Psychiatric Specialty Exam: Physical Exam Vitals and nursing note reviewed.  Constitutional:      General: She is not in acute distress.    Appearance: Normal appearance. She is normal weight.  HENT:     Head: Normocephalic and atraumatic.     Nose: Nose normal.  Eyes:     Extraocular Movements: Extraocular movements intact.  Pulmonary:     Effort: Pulmonary effort is normal. No respiratory distress.  Musculoskeletal:        General: Normal range of motion.     Cervical back: Normal range of motion.  Neurological:     General: No focal deficit present.     Mental Status: She is alert and oriented to person, place, and time.     Review of Systems  Constitutional: Negative.   Respiratory: Negative.   Cardiovascular: Negative.   Gastrointestinal:       Abdominal cramping  Musculoskeletal: Negative.   Neurological:       Reports craving opiates  Psychiatric/Behavioral: Negative for agitation, behavioral problems, confusion, decreased concentration, dysphoric mood, hallucinations, self-injury, sleep disturbance and suicidal ideas. The patient is nervous/anxious. The patient is not hyperactive.     Blood pressure 140/89, pulse 90, temperature 98.8 F (37.1 C), temperature source Oral, resp. rate 18, height 5' 10"  (1.778  m), weight 83.5 kg, SpO2 98 %, unknown if currently breastfeeding.Body mass index is 26.4 kg/m.  General Appearance: Casual  Eye Contact:  Good  Speech:  Normal Rate  Volume:  Normal  Mood:  Anxious and Dysphoric  Affect:  Congruent  Thought Process:  Coherent and Descriptions of  Associations: Intact  Orientation:  Full (Time, Place, and Person)  Thought Content:  Logical and Hallucinations: None  Suicidal Thoughts:  No  Homicidal Thoughts:  No  Memory:  Immediate;   Fair Recent;   Fair Remote;   Fair  Judgement:  Intact  Insight:  Lacking  Psychomotor Activity:  Normal  Concentration:  Concentration: Good and Attention Span: Good  Recall:  Good  Fund of Knowledge:  Good  Language:  Good  Akathisia:  Negative  Handed:  Right  AIMS (if indicated):     Assets:  Communication Skills Desire for Improvement Physical Health Resilience Social Support  ADL's:  Intact  Cognition:  WNL  Sleep:  Number of Hours: 7     Treatment Plan Summary: Daily contact with patient to assess and evaluate symptoms and progress in treatment, Medication management and Plan : Patient is seen and examined.  Patient is a 29 year old female with the above-stated past psychiatric history who is seen in follow-up.   Diagnosis: 1.  Bipolar disorder; most recently mixed, severe with psychotic features 2.  Polysubstance dependence.   Pertinent findings on examination today: 1.  Paranoia is decreased. 2.  Irritability has decreased. 3.  Sleeping well 4.  Patient feeling anxious about starting substance abuse treatment 5.  Patient endorsing abdominal cramping and craving for opiates  Plan: 1.  Continue Buspirone 15 mg p.o. 3 times daily for anxiety. 2.  Continue Fluoxetine 20 mg p.o. daily for depression and anxiety. 3.  Continue hydroxyzine 25 mg p.o. 3 times daily as needed anxiety. 4.  Continue Zyprexa agitation protocol as needed. 5.  Continue Seroquel 400 mg p.o. nightly for mood stability, psychosis and sleep. 6.  Continue Topamax 25 mg p.o. twice daily for mood stability and anxiety. 7.  Continue trazodone 50 mg p.o. nightly as needed insomnia. 8.  Continue Trileptal 450 mg p.o. twice daily for mood stability and anxiety. 9.  Laboratories reviewed: Magnesium normal, hCG  negative, CBC with H/H 10.9/34.2, CMP with mildly low potassium 3.4, Tylenol, salicylate, and ethanol levels negative, and urine drug screen was positive for cocaine and benzodiazepines 3 weeks ago.  She has not provided a new urine drug screen. 10. Patient has been accepted to [facility] for 10/18 at 1 PM 11.  Allow daily video visit with patient and her children 12.  Disposition planning-in progress.  Lavella Hammock, MD 07/10/2020, 5:22 PM

## 2020-07-10 NOTE — Progress Notes (Signed)
Adult Psychoeducational Group Note  Date:  07/10/2020 Time:  8:21 PM  Group Topic/Focus:  Wrap-Up Group:   The focus of this group is to help patients review their daily goal of treatment and discuss progress on daily workbooks.  Participation Level:  Did Not Attend  Participation Quality:  Did Not Attend  Affect:  Did Not Attend  Cognitive:  Did Not Attend  Insight: None  Engagement in Group:  Did Not Attend  Modes of Intervention:  Did Not Attend  Additional Comments:  Did not attend evening wrap up group tonight.  Candy Sledge 07/10/2020, 8:21 PM

## 2020-07-10 NOTE — Progress Notes (Signed)
Adult Psychoeducational Group Note  Date:  07/10/2020 Time:  3:45 AM  Group Topic/Focus:  Wrap-Up Group:   The focus of this group is to help patients review their daily goal of treatment and discuss progress on daily workbooks.  Participation Level:  Did Not Attend  Participation Quality:  Did Not Attend  Affect:  Did Not Attend  Cognitive:  Did Not Attend  Insight: None  Engagement in Group:  Did Not Attend  Modes of Intervention:  Did Not Attend  Additional Comments:  Pt did not attend evening wrap up group tonight.  Candy Sledge 07/10/2020, 3:45 AM

## 2020-07-10 NOTE — Progress Notes (Signed)
D: Patient presents with anxious affect. Patient is guarded during interaction. Patient denies SI/HI at this time. Patient also denies AH/VH at this time. Patient aware to notify staff or RN if this changes.  A: Provided positive reinforcement and encouragement.  R: Patient cooperative and receptive to efforts.

## 2020-07-11 DIAGNOSIS — F1110 Opioid abuse, uncomplicated: Secondary | ICD-10-CM

## 2020-07-11 DIAGNOSIS — F0634 Mood disorder due to known physiological condition with mixed features: Secondary | ICD-10-CM | POA: Diagnosis not present

## 2020-07-11 DIAGNOSIS — F1410 Cocaine abuse, uncomplicated: Secondary | ICD-10-CM | POA: Diagnosis not present

## 2020-07-11 DIAGNOSIS — F1910 Other psychoactive substance abuse, uncomplicated: Secondary | ICD-10-CM | POA: Diagnosis not present

## 2020-07-11 DIAGNOSIS — F3164 Bipolar disorder, current episode mixed, severe, with psychotic features: Secondary | ICD-10-CM

## 2020-07-11 MED ORDER — HYDROXYZINE HCL 25 MG PO TABS
25.0000 mg | ORAL_TABLET | Freq: Four times a day (QID) | ORAL | Status: DC | PRN
  Administered 2020-07-11 – 2020-07-12 (×3): 25 mg via ORAL
  Filled 2020-07-11 (×4): qty 1

## 2020-07-11 NOTE — Progress Notes (Signed)
Patient presents with a flat affect and depressed mood. She is isolative to her room and appears withdrawn. She denies SI/HI. She denies AVH. She reports feeling anxious and requested vistaril. She reports craving drugs and requested clonidine. She denies withdrawal symptoms.  Orders reviewed. Vital signs reviewed. Verbal support provided. 15 minute checks performed for safety.  Pt compliant with taking medications and denies any medication side effects.

## 2020-07-11 NOTE — Progress Notes (Signed)
   07/11/20 2345  COVID-19 Daily Checkoff  Have you had a fever (temp > 37.80C/100F)  in the past 24 hours?  No  If you have had runny nose, nasal congestion, sneezing in the past 24 hours, has it worsened? No  COVID-19 EXPOSURE  Have you traveled outside the state in the past 14 days? No  Have you been in contact with someone with a confirmed diagnosis of COVID-19 or PUI in the past 14 days without wearing appropriate PPE? No  Have you been living in the same home as a person with confirmed diagnosis of COVID-19 or a PUI (household contact)? No  Have you been diagnosed with COVID-19? No

## 2020-07-11 NOTE — Progress Notes (Signed)
Writer spoke with patient 1:1 at nursing station. She was informed of her hs medications and inquired about her day and her upcoming discharge.She did request medication to help with her cravings and received vistaril.  She is very soft spoken and polite. Writer encouraged her to stick with her rehab so that she will be able to care for her kids. She sat in the dayroom briefly before retuning to her room. Safety maintained on unit with 15 min checks.

## 2020-07-11 NOTE — [facility] Group Notes (Signed)
[facility] LCSW Group Therapy  07/11/2020 3:31 PM   Type of Therapy and Topic: Group Therapy: Anger Management   Description of Group: In this group, patients will learn helpful strategies and techniques to manage anger, express anger in alternative ways, change hostile attitudes, and prevent aggressive acts, such as verbal abuse and violence.This group will be process-oriented and eductional, with patients participating in exploration of their own experiences as well as giving and receiving support and challenge from other group members.  Therapeutic Goals: 1. Patient will learn to manage anger. 2. Patient will learn to stop violence or the threat of violence. 3. Patient will learn to develop self control over thoughts and actions. 4. Patient will receive support and feedback from others  Therapeutic Modalities: Cognitive Behavioral Therapy Solution Focused Therapy Motivational Interviewing  Type of Therapy:  Group Therapy  Participation Level:  Did Not Attend   Summary of Progress/Problems: This patient was invited to attend group by CSW, however this patient declined to attend.   Xolani Degracia A Sharlene Mccluskey 07/11/2020, 3:31 PM

## 2020-07-11 NOTE — Progress Notes (Signed)
[facility] MD Progress Note  07/11/2020 5:41 PM Latoya [name]  MRN:  [id number] Subjective: Patient is a 29 year old female with a reported past psychiatric history significant for crack cocaine, heroin and recent overdose of this in addition to Seroquel to kill her self.  She has a reported history as well of bipolar disorder.  Objective: Patient is seen and examined.  Patient is a 29 year old female with the above-stated past psychiatric history who is seen in follow-up.   Today, patient is calm and cooperative.  She appears to be euthymic.  She is dressed and visible in the milieu.  She continues to be anxious about starting treatment at [facility] on 07/12/2020.Patient is provided with a tablet for visitation with her mother and children.  She admits that she enjoys seeing her children, however it increases her anxiety and causes her to crave opiates.  Patient states that hydroxyzine and clonidine were helpful in decreasing her anxiety and cravings.  This has been provided as needed. She reports she is tolerating her medicines without side effects.  She has had no behavioral issues.  Sleep and appetite have improved.  She denies SI, HI, AVH today. Her vital signs are stable, she is afebrile.  She slept 6.75 hours last night.  No new laboratories.   A family meeting was completed by phone on 07/10/2020: By phone with patient's mother, Seth Bake. Mother expresses concern that should patient be discharged, she will likely relapse and not make it to [facility], noting that they have attempted this in the past with other substance treatment programs.  Time is allowed for patient to have a visit with her children (78-year-old son and 20-year-old daughter) and mother by phone.   Provider was present for part of family meeting today 07/11/2020 with patient by tablet.  Mother requests patient have safe transport to [facility], which will be arranged by social work.   Principal Problem: Bipolar and related disorder due to  another medical condition with mixed features Diagnosis: Principal Problem:   Bipolar and related disorder due to another medical condition with mixed features Active Problems:   Cocaine abuse ([place])   Heroin abuse ([place])  Total Time spent with patient: 35 minutes  Past Psychiatric History: See admission H&P  Past Medical History:  Past Medical History:  Diagnosis Date  . Anemia   . Anxiety   . Arthritis   . Blood transfusion without reported diagnosis   . Crohn's disease ([place])   . Depression   . Drug-seeking behavior   . Gestational diabetes   . History of substance abuse ([place])   . HSV infection   . Pregnancy induced hypertension   . Substance abuse [facility])     Past Surgical History:  Procedure Laterality Date  . CHOLECYSTECTOMY    . COLONOSCOPY N/A 07/07/2017   Procedure: COLONOSCOPY;  Surgeon: Juanita Craver, MD;  Location: [facility] ENDOSCOPY;  Service: Endoscopy;  Laterality: N/A;  . DEEP NECK LYMPH NODE BIOPSY / EXCISION  2011   Family History:  Family History  Problem Relation Age of Onset  . Diabetes Mother   . Hypertension Mother   . Hypertension Father   . Stroke Maternal Grandfather   . Colon cancer Neg Hx   . Stomach cancer Neg Hx    Family Psychiatric  History: See admission H&P  Social History:  Social History   Substance and Sexual Activity  Alcohol Use No   Comment: "not for a while"     Social History   Substance and Sexual  Activity  Drug Use Yes  . Types: "Crack" cocaine, Cocaine   Comment: cocai, former user, 06/27/2017    Social History   Socioeconomic History  . Marital status: Single    Spouse name: Not on file  . Number of children: 2  . Years of education: Not on file  . Highest education level: Not on file  Occupational History  . Not on file  Tobacco Use  . Smoking status: Never Smoker  . Smokeless tobacco: Never Used  Vaping Use  . Vaping Use: Never used  Substance and Sexual Activity  . Alcohol use: No    Comment: "not for a  while"  . Drug use: Yes    Types: "Crack" cocaine, Cocaine    Comment: cocai, former user, 06/27/2017  . Sexual activity: Not Currently    Partners: Male    Birth control/protection: None  Other Topics Concern  . Not on file  Social History Narrative  . Not on file   Social Determinants of Health   Financial Resource Strain:   . Difficulty of Paying Living Expenses: Not on file  Food Insecurity:   . Worried About Charity fundraiser in the Last Year: Not on file  . Ran Out of Food in the Last Year: Not on file  Transportation Needs:   . Lack of Transportation (Medical): Not on file  . Lack of Transportation (Non-Medical): Not on file  Physical Activity:   . Days of Exercise per Week: Not on file  . Minutes of Exercise per Session: Not on file  Stress:   . Feeling of Stress : Not on file  Social Connections:   . Frequency of Communication with Friends and Family: Not on file  . Frequency of Social Gatherings with Friends and Family: Not on file  . Attends Religious Services: Not on file  . Active Member of Clubs or Organizations: Not on file  . Attends Archivist Meetings: Not on file  . Marital Status: Not on file   Additional Social History:             Has been living off and on with mother while attempting to do substance use rehabilitation [facility].            Sleep: Good  Appetite:  Good  Current Medications: Current Facility-Administered Medications  Medication Dose Route Frequency Provider Last Rate Last Admin  . acetaminophen (TYLENOL) tablet 650 mg  650 mg Oral Q6H PRN Connye Burkitt, NP      . alum & mag hydroxide-simeth (MAALOX/MYLANTA) 200-200-20 MG/5ML suspension 30 mL  30 mL Oral Q4H PRN Connye Burkitt, NP      . busPIRone (BUSPAR) tablet 15 mg  15 mg Oral TID Sharma Covert, MD   15 mg at 07/11/20 1639  . cloNIDine (CATAPRES) tablet 0.1 mg  0.1 mg Oral TID PRN Lavella Hammock, MD   0.1 mg at 07/11/20 1220  .  FLUoxetine (PROZAC) capsule 20 mg  20 mg Oral Daily Sharma Covert, MD   20 mg at 07/11/20 1016  . haloperidol (HALDOL) tablet 10 mg  10 mg Oral BID PRN Sharma Covert, MD   10 mg at 07/08/20 1642   Or  . haloperidol lactate (HALDOL) injection 10 mg  10 mg Intramuscular BID PRN Sharma Covert, MD      . hydrOXYzine (ATARAX/VISTARIL) tablet 25 mg  25 mg Oral Q6H PRN Lavella Hammock, MD  25 mg at 07/11/20 1440  . magnesium hydroxide (MILK OF MAGNESIA) suspension 30 mL  30 mL Oral Daily PRN Connye Burkitt, NP      . OLANZapine zydis (ZYPREXA) disintegrating tablet 10 mg  10 mg Oral Q8H PRN Connye Burkitt, NP   10 mg at 07/06/20 1900   And  . ziprasidone (GEODON) injection 20 mg  20 mg Intramuscular PRN Connye Burkitt, NP      . OXcarbazepine (TRILEPTAL) tablet 450 mg  450 mg Oral BID Sharma Covert, MD   450 mg at 07/11/20 1639  . pantoprazole (PROTONIX) EC tablet 40 mg  40 mg Oral Daily Sharma Covert, MD   40 mg at 07/11/20 1015  . QUEtiapine (SEROQUEL) tablet 400 mg  400 mg Oral QHS Sharma Covert, MD   400 mg at 07/10/20 2124  . topiramate (TOPAMAX) tablet 25 mg  25 mg Oral BID Sharma Covert, MD   25 mg at 07/11/20 1639  . traZODone (DESYREL) tablet 50 mg  50 mg Oral QHS PRN Connye Burkitt, NP   50 mg at 07/10/20 2124    Lab Results: No results found for this or any previous visit (from the past 26 hour(s)).  Blood Alcohol level:  Lab Results  Component Value Date   ETH <10 07/05/2020   ETH <10 81/09/7508    Metabolic Disorder Labs: Lab Results  Component Value Date   HGBA1C 5.2 01/10/2017   No results found for: PROLACTIN Lab Results  Component Value Date   CHOL 109 07/09/2017   TRIG 138 07/09/2017   HDL 44 07/09/2017   CHOLHDL 2.5 07/09/2017   VLDL 28 07/09/2017   LDLCALC 37 07/09/2017    Physical Findings: AIMS: Facial and Oral Movements Muscles of Facial Expression: None, normal Lips and Perioral Area: None, normal Jaw: None,  normal Tongue: None, normal,Extremity Movements Upper (arms, wrists, hands, fingers): None, normal Lower (legs, knees, ankles, toes): None, normal, Trunk Movements Neck, shoulders, hips: None, normal, Overall Severity Severity of abnormal movements (highest score from questions above): None, normal Incapacitation due to abnormal movements: None, normal Patient's awareness of abnormal movements (rate only patient's report): No Awareness, Dental Status Current problems with teeth and/or dentures?: No Does patient usually wear dentures?: No  CIWA:    COWS:     Musculoskeletal: Strength & Muscle Tone: within normal limits Gait & Station: normal Patient leans: N/A  Psychiatric Specialty Exam: Physical Exam Vitals and nursing note reviewed.  Constitutional:      General: She is not in acute distress.    Appearance: Normal appearance. She is normal weight.  HENT:     Head: Normocephalic and atraumatic.     Nose: Nose normal.  Eyes:     Extraocular Movements: Extraocular movements intact.  Pulmonary:     Effort: Pulmonary effort is normal. No respiratory distress.  Musculoskeletal:        General: Normal range of motion.     Cervical back: Normal range of motion.  Neurological:     General: No focal deficit present.     Mental Status: She is alert and oriented to person, place, and time.     Review of Systems  Constitutional: Negative.   Respiratory: Negative.   Cardiovascular: Negative.   Gastrointestinal:       Abdominal cramping  Musculoskeletal: Negative.   Neurological:       Reports craving opiates  Psychiatric/Behavioral: Negative for agitation, behavioral problems, confusion, decreased concentration, dysphoric  mood, hallucinations, self-injury, sleep disturbance and suicidal ideas. The patient is nervous/anxious. The patient is not hyperactive.     Blood pressure 100/68, pulse 90, temperature 98.8 F (37.1 C), temperature source Oral, resp. rate 18, height 5' 10"   (1.778 m), weight 83.5 kg, SpO2 98 %, unknown if currently breastfeeding.Body mass index is 26.4 kg/m.  General Appearance: Casual  Eye Contact:  Good  Speech:  Normal Rate  Volume:  Normal  Mood:  Anxious and Euthymic  Affect:  Congruent  Thought Process:  Coherent and Descriptions of Associations: Intact  Orientation:  Full (Time, Place, and Person)  Thought Content:  Logical and Hallucinations: None  Suicidal Thoughts:  No  Homicidal Thoughts:  No  Memory:  Immediate;   Fair Recent;   Fair Remote;   Fair  Judgement:  Intact  Insight:  Lacking  Psychomotor Activity:  Normal  Concentration:  Concentration: Good and Attention Span: Good  Recall:  Good  Fund of Knowledge:  Good  Language:  Good  Akathisia:  Negative  Handed:  Right  AIMS (if indicated):     Assets:  Communication Skills Desire for Improvement Physical Health Resilience Social Support  ADL's:  Intact  Cognition:  WNL  Sleep:  Number of Hours: 6.75     Treatment Plan Summary: Daily contact with patient to assess and evaluate symptoms and progress in treatment, Medication management and Plan : Patient is seen and examined.  Patient is a 29 year old female with the above-stated past psychiatric history who is seen in follow-up.   Diagnosis: 1.  Bipolar disorder; most recently mixed, severe with psychotic features 2.  Polysubstance dependence.   Pertinent findings on examination today: 1.  Paranoia-resolved 2.  Irritability-improved 3.  Sleeping well 4.  Patient feeling anxious about starting substance abuse treatment 5.  Patient endorsing abdominal cramping and craving for opiates, which is relieved with hydroxyzine and clonidine  Plan: 1.  Continue Buspirone 15 mg p.o. 3 times daily for anxiety. 2.  Continue Fluoxetine 20 mg p.o. daily for depression and anxiety. 3.  Continue hydroxyzine 25 mg p.o. increased to every 4 hours as needed anxiety. 4.  Continue Zyprexa agitation protocol as needed. 5.   Continue Seroquel 400 mg p.o. nightly for mood stability, psychosis and sleep. 6.  Continue Topamax 25 mg p.o. twice daily for mood stability and anxiety. 7.  Continue trazodone 50 mg p.o. nightly as needed insomnia. 8.  Continue Trileptal 450 mg p.o. twice daily for mood stability and anxiety. 9.  Laboratories reviewed: Magnesium normal, hCG negative, CBC with H/H 10.9/34.2, CMP with mildly low potassium 3.4, Tylenol, salicylate, and ethanol levels negative, and urine drug screen was positive for cocaine and benzodiazepines 3 weeks ago.  She has not provided a new urine drug screen. 10. Allow daily video visit with patient and her children 11.  Patient has been accepted to [facility] for 10/18 at 1 PM   [facility], MD 07/11/2020, 5:41 PM

## 2020-07-12 DIAGNOSIS — F0634 Mood disorder due to known physiological condition with mixed features: Secondary | ICD-10-CM

## 2020-07-12 MED ORDER — TOPIRAMATE 25 MG PO TABS: 25.0000 mg | ORAL_TABLET | Freq: Two times a day (BID) | ORAL | 0 refills | Status: DC

## 2020-07-12 MED ORDER — HYDROXYZINE HCL 25 MG PO TABS: 25.0000 mg | ORAL_TABLET | Freq: Four times a day (QID) | ORAL | 0 refills | Status: DC | PRN

## 2020-07-12 MED ORDER — TRAZODONE HCL 50 MG PO TABS: 50.0000 mg | ORAL_TABLET | Freq: Every evening | ORAL | 0 refills | Status: DC | PRN

## 2020-07-12 MED ORDER — BUSPIRONE HCL 15 MG PO TABS: 15.0000 mg | ORAL_TABLET | Freq: Three times a day (TID) | ORAL | 0 refills | Status: DC

## 2020-07-12 MED ORDER — QUETIAPINE FUMARATE 400 MG PO TABS
400.0000 mg | ORAL_TABLET | Freq: Every day | ORAL | 0 refills | Status: DC
Start: 2020-07-12 — End: 2020-10-22

## 2020-07-12 MED ORDER — FLUOXETINE HCL 20 MG PO CAPS
20.0000 mg | ORAL_CAPSULE | Freq: Every day | ORAL | 0 refills | Status: DC
Start: 2020-07-13 — End: 2020-10-25

## 2020-07-12 MED ORDER — PANTOPRAZOLE SODIUM 40 MG PO TBEC
40.0000 mg | DELAYED_RELEASE_TABLET | Freq: Every day | ORAL | 0 refills | Status: DC
Start: 2020-07-13 — End: 2020-10-22

## 2020-07-12 MED ORDER — CVS MELATONIN 3 MG PO TABS
6.0000 mg | ORAL_TABLET | Freq: Every day | ORAL | 0 refills | Status: DC
Start: 2020-07-12 — End: 2020-10-22

## 2020-07-12 MED ORDER — OXCARBAZEPINE 150 MG PO TABS
450.0000 mg | ORAL_TABLET | Freq: Two times a day (BID) | ORAL | 0 refills | Status: DC
Start: 2020-07-12 — End: 2020-10-25

## 2020-07-12 NOTE — BHH Suicide Risk Assessment (Signed)
[facility] Discharge Suicide Risk Assessment   Principal Problem: Bipolar and related disorder due to another medical condition with mixed features Discharge Diagnoses: Principal Problem:   Bipolar and related disorder due to another medical condition with mixed features Active Problems:   Cocaine abuse ([place])   Heroin abuse ([place])   Total Time spent with patient: 15 minutes  Musculoskeletal: Strength & Muscle Tone: within normal limits Gait & Station: normal Patient leans: N/A  Psychiatric Specialty Exam: Review of Systems  Blood pressure 100/65, pulse (!) 110, temperature 98.3 F (36.8 C), temperature source Oral, resp. rate 18, height 5' 10"  (1.778 m), weight 83.5 kg, SpO2 98 %, unknown if currently breastfeeding.Body mass index is 26.4 kg/m.  General Appearance: Casual and Fairly Groomed  Engineer, water::  Fair  Speech:  Clear and Coherent and Normal Rate409  Volume:  Normal  Mood:  "good"  Affect:  Appropriate and Constricted  Thought Process:  Coherent  Orientation:  Full (Time, Place, and Person)  Thought Content:  WDL and Logical  Suicidal Thoughts:  denies  Homicidal Thoughts:  denies  Memory:  Immediate;   Fair Recent;   Poor  Judgement:  Fair  Insight:  Good  Psychomotor Activity:  Normal  Concentration:  Fair  Recall:  [employer] of Knowledge:Fair  Language: Good  Akathisia:  No  Handed:  Right  AIMS (if indicated):     Assets:  Communication Skills Desire for Improvement Resilience  Sleep:  Number of Hours: 4.5  Cognition: WNL  ADL's:  Intact   Mental Status Per Nursing Assessment::   On Admission:  NA  Demographic Factors:  Low socioeconomic status and Unemployed  Loss Factors: NA  Historical Factors: Impulsivity  Risk Reduction Factors:   NA  Continued Clinical Symptoms:  Previous Psychiatric Diagnoses and Treatments Medical Diagnoses and Treatments/Surgeries  Cognitive Features That Contribute To Risk:  None    Suicide Risk:  Minimal: No  identifiable suicidal ideation.  Patients presenting with no risk factors but with morbid ruminations; may be classified as minimal risk based on the severity of the depressive symptoms   [facility]. Go on 07/12/2020.   Why: You have an appointment for intake on 07/12/20 at 1:00 pm for residential treatment services.  Contact information: [place] [place] [address] [phone]               Plan Of Care/Follow-up recommendations:  Activity:  as tolerated Diet:  regular Other:      Patient to be discharged to rehab, patient is looking forward to rehab and expresses that she is looking forward to receive treatment.   Patient is instructed prior to discharge to: Take all medications as prescribed by his/her mental healthcare provider. Report any adverse effects and or reactions from the medicines to his/her outpatient provider promptly. Patient has been instructed & cautioned: To not engage in alcohol and or illegal drug use while on prescription medicines. In the event of worsening symptoms, patient is instructed to call the crisis hotline, 911 and or go to the nearest ED for appropriate evaluation and treatment of symptoms. To follow-up with his/her primary care provider for your other medical issues, concerns and or health care needs.     Ival Bible, MD 07/12/2020, 9:13 AM

## 2020-07-12 NOTE — Tx Team (Cosign Needed)
Interdisciplinary Treatment and Diagnostic Plan Update  07/12/2020 Time of Session: 9;35am Latoya [name] MRN: [id number]  Principal Diagnosis: Bipolar and related disorder due to another medical condition with mixed features  Secondary Diagnoses: Principal Problem:   Bipolar and related disorder due to another medical condition with mixed features Active Problems:   Cocaine abuse ([place])   Heroin abuse ([place])   Current Medications:  Current Facility-Administered Medications  Medication Dose Route Frequency Provider Last Rate Last Admin  . acetaminophen (TYLENOL) tablet 650 mg  650 mg Oral Q6H PRN Connye Burkitt, NP      . alum & mag hydroxide-simeth (MAALOX/MYLANTA) 200-200-20 MG/5ML suspension 30 mL  30 mL Oral Q4H PRN Connye Burkitt, NP      . busPIRone (BUSPAR) tablet 15 mg  15 mg Oral TID Sharma Covert, MD   15 mg at 07/12/20 0907  . cloNIDine (CATAPRES) tablet 0.1 mg  0.1 mg Oral TID PRN Lavella Hammock, MD   0.1 mg at 07/11/20 1220  . FLUoxetine (PROZAC) capsule 20 mg  20 mg Oral Daily Sharma Covert, MD   20 mg at 07/12/20 4098  . haloperidol (HALDOL) tablet 10 mg  10 mg Oral BID PRN Sharma Covert, MD   10 mg at 07/08/20 1642   Or  . haloperidol lactate (HALDOL) injection 10 mg  10 mg Intramuscular BID PRN Sharma Covert, MD      . hydrOXYzine (ATARAX/VISTARIL) tablet 25 mg  25 mg Oral Q6H PRN Lavella Hammock, MD   25 mg at 07/12/20 0458  . magnesium hydroxide (MILK OF MAGNESIA) suspension 30 mL  30 mL Oral Daily PRN Connye Burkitt, NP      . OLANZapine zydis (ZYPREXA) disintegrating tablet 10 mg  10 mg Oral Q8H PRN Connye Burkitt, NP   10 mg at 07/12/20 1191   And  . ziprasidone (GEODON) injection 20 mg  20 mg Intramuscular PRN Connye Burkitt, NP      . OXcarbazepine (TRILEPTAL) tablet 450 mg  450 mg Oral BID Sharma Covert, MD   450 mg at 07/12/20 0907  . pantoprazole (PROTONIX) EC tablet 40 mg  40 mg Oral Daily Sharma Covert, MD   40 mg at  07/12/20 4782  . QUEtiapine (SEROQUEL) tablet 400 mg  400 mg Oral QHS Sharma Covert, MD   400 mg at 07/11/20 2016  . topiramate (TOPAMAX) tablet 25 mg  25 mg Oral BID Sharma Covert, MD   25 mg at 07/12/20 9562  . traZODone (DESYREL) tablet 50 mg  50 mg Oral QHS PRN Connye Burkitt, NP   50 mg at 07/11/20 2016   PTA Medications: Medications Prior to Admission  Medication Sig Dispense Refill Last Dose  . FLUoxetine (PROZAC) 10 MG capsule Take 10 mg by mouth at bedtime.     . hydrOXYzine (VISTARIL) 50 MG capsule Take 50 mg by mouth 3 (three) times daily as needed for anxiety.     . Oxcarbazepine (TRILEPTAL) 300 MG tablet Take 300 mg by mouth 2 (two) times daily.     . [DISCONTINUED] busPIRone (BUSPAR) 15 MG tablet Take 15 mg by mouth 3 (three) times daily.     . [DISCONTINUED] [facility] MELATONIN 3 MG TABS tablet Take 6 mg by mouth at bedtime.     . [DISCONTINUED] QUEtiapine (SEROQUEL) 400 MG tablet Take 400 mg by mouth at bedtime.      . [DISCONTINUED] topiramate (TOPAMAX) 25  MG tablet Take 25 mg by mouth 2 (two) times daily.       Patient Stressors: Financial difficulties Medication change or noncompliance Substance abuse Traumatic event  Patient Strengths: Capable of independent living Physical Health Supportive family/friends  Treatment Modalities: Medication Management, Group therapy, Case management,  1 to 1 session with clinician, Psychoeducation, Recreational therapy.   Physician Treatment Plan for Primary Diagnosis: Bipolar and related disorder due to another medical condition with mixed features Long Term Goal(s): Improvement in symptoms so as ready for discharge Improvement in symptoms so as ready for discharge   Short Term Goals: Ability to identify changes in lifestyle to reduce recurrence of condition will improve Ability to verbalize feelings will improve Ability to disclose and discuss suicidal ideas Ability to demonstrate self-control will improve Ability to  identify and develop effective coping behaviors will improve Ability to maintain clinical measurements within normal limits will improve Ability to identify triggers associated with substance abuse/mental health issues will improve Ability to identify changes in lifestyle to reduce recurrence of condition will improve Ability to verbalize feelings will improve Ability to disclose and discuss suicidal ideas Ability to demonstrate self-control will improve Ability to identify and develop effective coping behaviors will improve Ability to maintain clinical measurements within normal limits will improve Ability to identify triggers associated with substance abuse/mental health issues will improve  Medication Management: Evaluate patient's response, side effects, and tolerance of medication regimen.  Therapeutic Interventions: 1 to 1 sessions, Unit Group sessions and Medication administration.  Evaluation of Outcomes: Adequate for Discharge  Physician Treatment Plan for Secondary Diagnosis: Principal Problem:   Bipolar and related disorder due to another medical condition with mixed features Active Problems:   Cocaine abuse ([place])   Heroin abuse ([place])  Long Term Goal(s): Improvement in symptoms so as ready for discharge Improvement in symptoms so as ready for discharge   Short Term Goals: Ability to identify changes in lifestyle to reduce recurrence of condition will improve Ability to verbalize feelings will improve Ability to disclose and discuss suicidal ideas Ability to demonstrate self-control will improve Ability to identify and develop effective coping behaviors will improve Ability to maintain clinical measurements within normal limits will improve Ability to identify triggers associated with substance abuse/mental health issues will improve Ability to identify changes in lifestyle to reduce recurrence of condition will improve Ability to verbalize feelings will improve Ability  to disclose and discuss suicidal ideas Ability to demonstrate self-control will improve Ability to identify and develop effective coping behaviors will improve Ability to maintain clinical measurements within normal limits will improve Ability to identify triggers associated with substance abuse/mental health issues will improve     Medication Management: Evaluate patient's response, side effects, and tolerance of medication regimen.  Therapeutic Interventions: 1 to 1 sessions, Unit Group sessions and Medication administration.  Evaluation of Outcomes: Adequate for Discharge   RN Treatment Plan for Primary Diagnosis: Bipolar and related disorder due to another medical condition with mixed features Long Term Goal(s): Knowledge of disease and therapeutic regimen to maintain health will improve  Short Term Goals: Ability to remain free from injury will improve, Ability to verbalize frustration and anger appropriately will improve, Ability to identify and develop effective coping behaviors will improve and Compliance with prescribed medications will improve  Medication Management: RN will administer medications as ordered by provider, will assess and evaluate patient's response and provide education to patient for prescribed medication. RN will report any adverse and/or side effects to prescribing provider.  Therapeutic  Interventions: 1 on 1 counseling sessions, Psychoeducation, Medication administration, Evaluate responses to treatment, Monitor vital signs and CBGs as ordered, Perform/monitor CIWA, COWS, AIMS and Fall Risk screenings as ordered, Perform wound care treatments as ordered.  Evaluation of Outcomes: Adequate for Discharge   LCSW Treatment Plan for Primary Diagnosis: Bipolar and related disorder due to another medical condition with mixed features Long Term Goal(s): Safe transition to appropriate next level of care at discharge, Engage patient in therapeutic group addressing  interpersonal concerns.  Short Term Goals: Engage patient in aftercare planning with referrals and resources, Facilitate acceptance of mental health diagnosis and concerns, Identify triggers associated with mental health/substance abuse issues and Increase skills for wellness and recovery  Therapeutic Interventions: Assess for all discharge needs, 1 to 1 time with Social worker, Explore available resources and support systems, Assess for adequacy in community support network, Educate family and significant other(s) on suicide prevention, Complete Psychosocial Assessment, Interpersonal group therapy.  Evaluation of Outcomes: Adequate for Discharge   Progress in Treatment: Attending groups: No. Participating in groups: No. Taking medication as prescribed: No. Toleration medication: No. Family/Significant other contact made: No, will contact:  if consent is provided Patient understands diagnosis: No. Discussing patient identified problems/goals with staff: Yes. Medical problems stabilized or resolved: Yes. Denies suicidal/homicidal ideation: Yes. Issues/concerns per patient self-inventory: No.   New problem(s) identified: No, Describe:  none  New Short Term/Long Term Goal(s): medication stabilization, elimination of SI thoughts, development of comprehensive mental wellness plan.   Patient Goals:  "Getting ready to go home"  Discharge Plan or Barriers: Patient recently admitted. CSW will continue to follow and assess for appropriate referrals and possible discharge planning.   Reason for Continuation of Hospitalization: Delusions  Medication stabilization Withdrawal symptoms  Estimated Length of Stay: 3-5 days  Attendees: Patient:  07/07/2020  Physician:  07/07/2020   Nursing:  07/07/2020  RN Care Manager: 07/07/2020   Social Worker: Freddi Che, LCSW 07/07/2020  Recreational Therapist:  07/07/2020   Other:  07/07/2020   Other:  07/07/2020  Other: 07/07/2020        Scribe for Treatment Team: Freddi Che, LCSW 07/12/2020 10:41 AM

## 2020-07-12 NOTE — Discharge Summary (Signed)
Physician Discharge Summary Note  Patient:  Latoya [name] is an 29 y.o., female MRN:  [id number] DOB:  22-Sep-1991 Patient phone:  [phone] (home)  Patient address:   [address] [place] [address],  Total Time spent with patient: 20 minutes  Date of Admission:  07/06/2020 Date of Discharge: 07/12/2020  Reason for Admission:  Mood stabilization  Principal Problem: Bipolar and related disorder due to another medical condition with mixed features Discharge Diagnoses: Principal Problem:   Bipolar and related disorder due to another medical condition with mixed features Active Problems:   Cocaine abuse ([place])   Heroin abuse ([place])   Past Psychiatric History: See H & P  Past Medical History:  Past Medical History:  Diagnosis Date  . Anemia   . Anxiety   . Arthritis   . Blood transfusion without reported diagnosis   . Crohn's disease ([place])   . Depression   . Drug-seeking behavior   . Gestational diabetes   . History of substance abuse ([place])   . HSV infection   . Pregnancy induced hypertension   . Substance abuse [facility])     Past Surgical History:  Procedure Laterality Date  . CHOLECYSTECTOMY    . COLONOSCOPY N/A 07/07/2017   Procedure: COLONOSCOPY;  Surgeon: Juanita Craver, MD;  [facility] ENDOSCOPY;  Service: Endoscopy;  Laterality: N/A;  . DEEP NECK LYMPH NODE BIOPSY / EXCISION  2011   Family History:  Family History  Problem Relation Age of Onset  . Diabetes Mother   . Hypertension Mother   . Hypertension Father   . Stroke Maternal Grandfather   . Colon cancer Neg Hx   . Stomach cancer Neg Hx    Family Psychiatric  History: See H & P Social History:  Social History   Substance and Sexual Activity  Alcohol Use No   Comment: "not for a while"     Social History   Substance and Sexual Activity  Drug Use Yes  . Types: "Crack" cocaine, Cocaine   Comment: cocai, former user, 06/27/2017    Social History   Socioeconomic History  .  Marital status: Single    Spouse name: Not on file  . Number of children: 2  . Years of education: Not on file  . Highest education level: Not on file  Occupational History  . Not on file  Tobacco Use  . Smoking status: Never Smoker  . Smokeless tobacco: Never Used  Vaping Use  . Vaping Use: Never used  Substance and Sexual Activity  . Alcohol use: No    Comment: "not for a while"  . Drug use: Yes    Types: "Crack" cocaine, Cocaine    Comment: cocai, former user, 06/27/2017  . Sexual activity: Not Currently    Partners: Male    Birth control/protection: None  Other Topics Concern  . Not on file  Social History Narrative  . Not on file   Social Determinants of Health   Financial Resource Strain:   . Difficulty of Paying Living Expenses: Not on file  Food Insecurity:   . Worried About Charity fundraiser in the Last Year: Not on file  . Ran Out of Food in the Last Year: Not on file  Transportation Needs:   . Lack of Transportation (Medical): Not on file  . Lack of Transportation (Non-Medical): Not on file  Physical Activity:   . Days of Exercise per Week: Not on file  . Minutes of Exercise per Session:  Not on file  Stress:   . Feeling of Stress : Not on file  Social Connections:   . Frequency of Communication with Friends and Family: Not on file  . Frequency of Social Gatherings with Friends and Family: Not on file  . Attends Religious Services: Not on file  . Active Member of Clubs or Organizations: Not on file  . Attends Archivist Meetings: Not on file  . Marital Status: Not on file    Hospital Course:            Latoya [name] was admitted to the adult 500 unit where she was evaluated and her symptoms were identified. Medication management was discussed and implemented. Her prozac in was increased to 20 for treatment of depression and anxiety. Patient tolerated buspar for the treatment of anxiety. Her mood was stabilized with a combination of  trileptal, seroquel, and topamax.  She was encouraged to participate in unit programming. Medical problems were identified and treated appropriately. Home medication was restarted as needed. She was evaluated each day by a clinical provider to ascertain the patient's response to treatment.  Improvement was noted by the patient's report of decreasing symptoms, improved sleep and appetite, affect, medication tolerance, behavior, and participation in unit programming.  The patient was asked each day to complete a self inventory noting mood, mental status, pain, new symptoms, anxiety and concerns.         She responded well to medication and being in a therapeutic and supportive environment. Patient sought to improve her life by seeking treatment for opiate addiction. She reported having two young children to live for. Positive and appropriate behavior was noted and the patient was motivated for recovery.  She worked closely with the treatment team and case manager to develop a discharge plan with appropriate goals. Coping skills, problem solving as well as relaxation therapies were also part of the unit programming.         By the day of discharge she was in much improved condition than upon admission.  Symptoms were reported as significantly decreased or resolved completely. The patient denied SI/HI and voiced no AVH. She was motivated to continue taking medication with a goal of continued improvement in mental health.          Latoya [name] was discharged to [facility] with prescriptions with a plan to follow up as noted below.   Physical Findings: AIMS: Facial and Oral Movements Muscles of Facial Expression: None, normal Lips and Perioral Area: None, normal Jaw: None, normal Tongue: None, normal,Extremity Movements Upper (arms, wrists, hands, fingers): None, normal Lower (legs, knees, ankles, toes): None, normal, Trunk Movements Neck, shoulders, hips: None, normal, Overall Severity Severity of  abnormal movements (highest score from questions above): None, normal Incapacitation due to abnormal movements: None, normal Patient's awareness of abnormal movements (rate only patient's report): No Awareness, Dental Status Current problems with teeth and/or dentures?: No Does patient usually wear dentures?: No  CIWA:    COWS:     Musculoskeletal: Strength & Muscle Tone: within normal limits Gait & Station: normal Patient leans: N/A  Psychiatric Specialty Exam: Physical Exam Vitals and nursing note reviewed.     Review of Systems  Blood pressure 100/65, pulse (!) 110, temperature 98.3 F (36.8 C), temperature source Oral, resp. rate 18, height 5' 10"  (1.778 m), weight 83.5 kg, SpO2 98 %, unknown if currently breastfeeding.Body mass index is 26.4 kg/m.  General Appearance: Casual  Eye Contact:  Fair  Speech:  Clear and Coherent  Volume:  Normal  Mood:  Euthymic  Affect:  Appropriate  Thought Process:  Coherent  Orientation:  Full (Time, Place, and Person)  Thought Content:  WDL  Suicidal Thoughts:  No  Homicidal Thoughts:  No  Memory:  Immediate;   Fair  Judgement:  Fair  Insight:  Good  Psychomotor Activity:  Normal  Concentration:  Concentration: Fair and Attention Span: Fair  Recall:  [employer] of Knowledge:  Fair  Language:  Good  Akathisia:  No  Handed:  Right  AIMS (if indicated):     Assets:  Communication Skills Desire for Improvement Resilience  ADL's:  Intact  Cognition:  WNL  Sleep:  Number of Hours: 4.5        Has this patient used any form of tobacco in the last 30 days? (Cigarettes, Smokeless Tobacco, Cigars, and/or Pipes) Yes, N/A  Blood Alcohol level:  Lab Results  Component Value Date   ETH <10 07/05/2020   ETH <10 68/08/7516    Metabolic Disorder Labs:  Lab Results  Component Value Date   HGBA1C 5.2 01/10/2017   No results found for: PROLACTIN Lab Results  Component Value Date   CHOL 109 07/09/2017   TRIG 138 07/09/2017   HDL  44 07/09/2017   CHOLHDL 2.5 07/09/2017   VLDL 28 07/09/2017   LDLCALC 37 07/09/2017    See Psychiatric Specialty Exam and Suicide Risk Assessment completed by Attending Physician prior to discharge.  Discharge destination:  [facility]  Is patient on multiple antipsychotic therapies at discharge:  No   Has Patient had three or more failed trials of antipsychotic monotherapy by history:  No  Recommended Plan for Multiple Antipsychotic Therapies: NA  Discharge Instructions    Diet - low sodium heart healthy   Complete by: As directed    Increase activity slowly   Complete by: As directed      Allergies as of 07/12/2020   No Known Allergies     Medication List    STOP taking these medications   hydrOXYzine 50 MG capsule Commonly known as: VISTARIL     TAKE these medications     Indication  busPIRone 15 MG tablet Commonly known as: BUSPAR Take 1 tablet (15 mg total) by mouth 3 (three) times daily.  Indication: Anxiety Disorder, Major Depressive Disorder   [facility] Melatonin 3 MG Tabs tablet Generic drug: melatonin Take 2 tablets (6 mg total) by mouth at bedtime.  Indication: Trouble Sleeping   FLUoxetine 20 MG capsule Commonly known as: PROZAC Take 1 capsule (20 mg total) by mouth daily. Start taking on: July 13, 2020 What changed:   medication strength  how much to take  when to take this  Indication: Depression   hydrOXYzine 25 MG tablet Commonly known as: ATARAX/VISTARIL Take 1 tablet (25 mg total) by mouth every 6 (six) hours as needed for anxiety.  Indication: Feeling Anxious   OXcarbazepine 150 MG tablet Commonly known as: TRILEPTAL Take 3 tablets (450 mg total) by mouth 2 (two) times daily. What changed:   medication strength  how much to take  Indication: Mood stabilization   pantoprazole 40 MG tablet Commonly known as: PROTONIX Take 1 tablet (40 mg total) by mouth daily. Start taking on: July 13, 2020  Indication: Gastroesophageal Reflux  Disease   QUEtiapine 400 MG tablet Commonly known as: SEROQUEL Take 1 tablet (400 mg total) by mouth at bedtime.  Indication: Agitation, Manic-Depression   topiramate 25 MG tablet Commonly  known as: TOPAMAX Take 1 tablet (25 mg total) by mouth 2 (two) times daily.  Indication: Migraine Headache   traZODone 50 MG tablet Commonly known as: DESYREL Take 1 tablet (50 mg total) by mouth at bedtime as needed for sleep.  Indication: [facility]. Go on 07/12/2020.   Why: You have an appointment for intake on 07/12/20 at 1:00 pm for residential treatment services.  Contact information: [address] [place] [address] [phone]               Follow-up recommendations:   Activity:  as tolerated Diet:  regular Other:    Activity:  as tolerated   Comments:    Patient to be discharged to rehab, patient is looking forward to rehab and expresses that she is looking forward to receive treatment.   Patient is instructed prior to discharge to: Take all medications as prescribed by his/her mental healthcare provider. Report any adverse effects and or reactions from the medicines to his/her outpatient provider promptly. Patient has been instructed & cautioned: To not engage in alcohol and or illegal drug use while on prescription medicines. In the event of worsening symptoms, patient is instructed to call the crisis hotline, 911 and or go to the nearest ED for appropriate evaluation and treatment of symptoms. To follow-up with his/her primary care provider for your other medical issues, concerns and or health care needs.    SignedElmarie Shiley, NP 07/12/2020, 9:24 AM

## 2020-07-12 NOTE — Plan of Care (Signed)
Discharge note  Patient verbalizes readiness for discharge. Follow up plan explained, AVS, Transition record and SRA given. Prescriptions and teaching provided. Belongings returned and signed for. Suicide safety plan completed and signed. Patient verbalizes understanding. Patient denies SI/HI and assures this Probation officer they will seek assistance should that change. Patient discharged to lobby where ride was waiting.  Problem: Education: Goal: Knowledge of [facility] Education information/materials will improve Outcome: Adequate for Discharge Goal: Emotional status will improve Outcome: Adequate for Discharge Goal: Mental status will improve Outcome: Adequate for Discharge Goal: Verbalization of understanding the information provided will improve Outcome: Adequate for Discharge   Problem: Activity: Goal: Interest or engagement in activities will improve Outcome: Adequate for Discharge Goal: Sleeping patterns will improve Outcome: Adequate for Discharge   Problem: Coping: Goal: Ability to verbalize frustrations and anger appropriately will improve Outcome: Adequate for Discharge Goal: Ability to demonstrate self-control will improve Outcome: Adequate for Discharge   Problem: Health Behavior/Discharge Planning: Goal: Identification of resources available to assist in meeting health care needs will improve Outcome: Adequate for Discharge Goal: Compliance with treatment plan for underlying cause of condition will improve Outcome: Adequate for Discharge   Problem: Physical Regulation: Goal: Ability to maintain clinical measurements within normal limits will improve Outcome: Adequate for Discharge   Problem: Safety: Goal: Periods of time without injury will increase Outcome: Adequate for Discharge   Problem: Education: Goal: Utilization of techniques to improve thought processes will improve Outcome: Adequate for Discharge Goal: Knowledge of the prescribed therapeutic regimen  will improve Outcome: Adequate for Discharge   Problem: Activity: Goal: Interest or engagement in leisure activities will improve Outcome: Adequate for Discharge Goal: Imbalance in normal sleep/wake cycle will improve Outcome: Adequate for Discharge   Problem: Health Behavior/Discharge Planning: Goal: Ability to make decisions will improve Outcome: Adequate for Discharge Goal: Compliance with therapeutic regimen will improve Outcome: Adequate for Discharge   Problem: Safety: Goal: Ability to disclose and discuss suicidal ideas will improve Outcome: Adequate for Discharge Goal: Ability to identify and utilize support systems that promote safety will improve Outcome: Adequate for Discharge   Problem: Self-Concept: Goal: Level of anxiety will decrease Outcome: Adequate for Discharge   Problem: Education: Goal: Knowledge of disease or condition will improve Outcome: Adequate for Discharge Goal: Understanding of discharge needs will improve Outcome: Adequate for Discharge   Problem: Health Behavior/Discharge Planning: Goal: Ability to identify changes in lifestyle to reduce recurrence of condition will improve Outcome: Adequate for Discharge Goal: Identification of resources available to assist in meeting health care needs will improve Outcome: Adequate for Discharge   Problem: Physical Regulation: Goal: Complications related to the disease process, condition or treatment will be avoided or minimized Outcome: Adequate for Discharge   Problem: Safety: Goal: Ability to remain free from injury will improve Outcome: Adequate for Discharge   Problem: Education: Goal: Ability to make informed decisions regarding treatment will improve Outcome: Adequate for Discharge   Problem: Health Behavior/Discharge Planning: Goal: Identification of resources available to assist in meeting health care needs will improve Outcome: Adequate for Discharge   Problem: Medication: Goal:  Compliance with prescribed medication regimen will improve Outcome: Adequate for Discharge

## 2020-07-12 NOTE — Progress Notes (Signed)
Progress note    07/12/20 0907  Psych Admission Type (Psych Patients Only)  Admission Status Involuntary  Psychosocial Assessment  Patient Complaints Anxiety  Eye Contact Fair  Facial Expression Animated;Anxious  Affect Anxious  Speech Logical/coherent  Interaction Assertive  Motor Activity Fidgety  Appearance/Hygiene In hospital gown  Behavior Characteristics Cooperative;Appropriate to situation;Anxious  Mood Anxious;Pleasant  Thought Process  Coherency WDL  Content WDL  Delusions None reported or observed  Perception WDL  Hallucination None reported or observed  Judgment WDL  Confusion None  Danger to Self  Current suicidal ideation? Denies  Danger to Others  Danger to Others None reported or observed

## 2020-07-12 NOTE — Progress Notes (Signed)
  [facility] Adult Case Management Discharge Plan :  Will you be returning to the same living situation after discharge:  No. Will be transfering to residential substance use treatment At discharge, do you have transportation home?: No. Safe Transport will be arranged Do you have the ability to pay for your medications: Yes,  has insurance   Release of information consent forms completed and in the chart;  Patient's signature needed at discharge.  Patient to Follow up at:  Follow-up [place]. Go on 07/12/2020.   Why: You have an appointment for intake on 07/12/20 at 1:00 pm for residential treatment services.  Contact information: [place] [address] [phone]               Next level of care provider has access to [place] and Suicide Prevention discussed: Yes,  with mother     Has patient been referred to the Quitline?: Patient refused referral  Patient has been referred for addiction treatment: Yes  Vassie Moselle, LCSW 07/12/2020, 9:35 AM

## 2020-07-12 NOTE — [facility] Group Notes (Signed)
The focus of this group is to help patients establish daily goals to achieve during treatment and discuss how the patient can incorporate goal setting into their daily lives to aide in recovery.  PT did not attend

## 2020-08-16 ENCOUNTER — Ambulatory Visit (HOSPITAL_COMMUNITY)
Admission: RE | Admit: 2020-08-16 | Discharge: 2020-08-16 | Disposition: A | Discharge status: Home / Self Care | Payer: Medicaid Other | Attending: Psychiatry | Admitting: Psychiatry

## 2020-08-16 ENCOUNTER — Emergency Department (HOSPITAL_COMMUNITY)
Admission: EM | Admit: 2020-08-16 | Discharge: 2020-08-16 | Disposition: A | Discharge status: Home / Self Care | Payer: Medicaid Other | Attending: Emergency Medicine | Admitting: Emergency Medicine

## 2020-08-16 ENCOUNTER — Encounter (HOSPITAL_COMMUNITY): Payer: Self-pay

## 2020-08-16 ENCOUNTER — Other Ambulatory Visit: Payer: Self-pay

## 2020-08-16 DIAGNOSIS — Z765 Malingerer [conscious simulation]: Secondary | ICD-10-CM | POA: Insufficient documentation

## 2020-08-16 DIAGNOSIS — F1920 Other psychoactive substance dependence, uncomplicated: Secondary | ICD-10-CM | POA: Insufficient documentation

## 2020-08-16 DIAGNOSIS — F1490 Cocaine use, unspecified, uncomplicated: Secondary | ICD-10-CM | POA: Diagnosis present

## 2020-08-16 DIAGNOSIS — F1190 Opioid use, unspecified, uncomplicated: Secondary | ICD-10-CM | POA: Insufficient documentation

## 2020-08-16 DIAGNOSIS — R45851 Suicidal ideations: Secondary | ICD-10-CM | POA: Insufficient documentation

## 2020-08-16 DIAGNOSIS — F1910 Other psychoactive substance abuse, uncomplicated: Secondary | ICD-10-CM

## 2020-08-16 NOTE — ED Triage Notes (Addendum)
Patient states she had a medication change and was placed on Wellbutrin and has been having suicidal thoughts and auditory hallucintations for the past 2 days. patieant states she has been taking Seroquel to sleep so she could get the feeling of being suicidal to go away. patient states her plan is to overose on seroquel. Patient denies HI visual hallucinations.  Patient states she used Cocaine last week for 2 days in a row.

## 2020-08-16 NOTE — BH Assessment (Addendum)
Assessment Note  Latoya [name] is an 29 y.o. female. She presented to [facility] as a walkin, voluntarily.  She was assessed by TTS/provider. Requesting "drug treatment" of heroin and Cocaine. Denies alcohol use. States that she uses Heroin and started using at the age of 29 yrs old. She started using cocaine at the age of 29 yrs old. Average amount of use for both drugs is 1 gram. She last used the heroin 1 month ago. She last used the cocaine 08/15/2020 and states she used the entire weekend. She denies withdrawal symptoms.Consequences of use include relationship conflict, financial, and legal. She has participated in several residential programs. She reports the last two programs were [facility] and [facility]. She was admitted to these programs in the past 2 months. She prematurely/AMA left [facility] after 7 days because she had to go to court.   Denied current current SI, HI, and AVH's. She has a history of intermittent suicidal ideations. Last episode of suicidal ideations was last last week and she was admitted to [facility] (discharge 08/12/20). Patient has a history of #1 suicide attempt in 2017-overdose. States that her suicide attempt was triggered by finding out that someone was putting crack in her cocaine. Current stressor is her on-going drug use. Depressive symptoms include: Depression Symptoms: Feeling worthless/self pity, Guilt, Fatigue, Loss of interest in usual pleasures. She has moderate anxiety. Appetite is good. Sleep is poor. She sleeps up to 6 hrs per day. She denies a history of trauma and/or abuse. She has family support from mother and grandmother. States that she stayed with her mother over the weekend. However, her permanent address is with her grandparents. She is not in school and/or employed. Denies HI. Denies history of assaultive/aggressive behaviors. Denies AVH's.    She did not meet criteria for an inpatient patient admission and was given referrals for substance abuse programs.  She also has a follow up appointment at the [facility] 09/05/2020 for med management and was informed of the walk in hours for the [facility] during the week. I suspect that she is malingering and possibly homeless. In the past 30 days she has had inpatient admission at several places [facility], [address], [place] (discharged 08/12/20), and back to [facility] today as a walk in.   Diagnosis: Major Depressive Disorder, Recurrent, Severe without psychotic features, Anxiety (per history), Substance Use Disorder   Past Medical History:  Past Medical History:  Diagnosis Date  . Anemia   . Anxiety   . Arthritis   . Blood transfusion without reported diagnosis   . Crohn's disease ([place])   . Depression   . Drug-seeking behavior   . Gestational diabetes   . History of substance abuse ([place])   . HSV infection   . Pregnancy induced hypertension   . Substance abuse [facility])     Past Surgical History:  Procedure Laterality Date  . CHOLECYSTECTOMY    . COLONOSCOPY N/A 07/07/2017   Procedure: COLONOSCOPY;  Surgeon: Juanita Craver, MD;  Location: [facility] ENDOSCOPY;  Service: Endoscopy;  Laterality: N/A;  . DEEP NECK LYMPH NODE BIOPSY / EXCISION  2011    Family History:  Family History  Problem Relation Age of Onset  . Diabetes Mother   . Hypertension Mother   . Hypertension Father   . Stroke Maternal Grandfather   . Colon cancer Neg Hx   . Stomach cancer Neg Hx     Social History:  reports that she has never smoked. She has never used  smokeless tobacco. She reports previous alcohol use. She reports current drug use. Drugs: "Crack" cocaine and Cocaine.  Additional Social History:  Alcohol / Drug Use Pain Medications: See MAR Prescriptions: See MAR History of alcohol / drug use?: Yes Substance #1 Name of Substance 1: Heroin 1 - Age of First Use: 29 yrs old 1 - Amount (size/oz): 1 gram 1 - Frequency: 2-3 times per month 1 - Duration: on-going 1 - Last Use / Amount: "Last month" Substance #2 Name of  Substance 2: Cocaine 2 - Age of First Use: 29 yrs old 2 - Amount (size/oz): "Anytime I get money" 2 - Frequency: "Every other day" 2 - Duration: Ongoing; states that she used the entire weekend 2 - Last Use / Amount: "I used the entire weekend"...last use was Sunday  CIWA: CIWA-Ar BP: 116/70 Pulse Rate: (!) 103 COWS:    Allergies: No Known Allergies  Home Medications:  No medications prior to admission.    OB/GYN Status:  Patient's last menstrual period was 08/14/2020.  General Assessment Data Location of Assessment:  [facility] Assessment) TTS Assessment: In system Is this a Tele or Face-to-Face Assessment?: Face-to-Face Is this an Initial Assessment or a Re-assessment for this encounter?: Initial Assessment Patient Accompanied by::  (self referral ) Language Other than English: No Living Arrangements:  (with grand parents primarily ; stayed with mo over the weeke) What gender do you identify as?: Female Date Telepsych consult ordered in [facility]:  (n/a) Time Telepsych consult ordered in [facility]:  (n/a) Marital status: [place] name:  (n/a) Living Arrangements: Other relatives (mother this past weekend...also with grandmother ) Can pt return to current living arrangement?: Yes Admission Status: Voluntary Is patient capable of signing voluntary admission?: Yes Referral Source: Self/Family/Friend     Crisis Care Plan Living Arrangements: Other relatives (mother this past weekend...also with grandmother ) Legal Guardian:  (no legal guardian ) Name of Psychiatrist:  (no psychiatrist ) Name of Therapist:  (no therapist )  Education Status Highest grade of school patient has completed: 12  Risk to self with the past 6 months Suicidal Ideation: No Has patient been a risk to self within the past 6 months prior to admission? : Yes Suicidal Intent: No Has patient had any suicidal intent within the past 6 months prior to admission? : No Is patient at risk for suicide?: No Suicidal  Plan?: No Has patient had any suicidal plan within the past 6 months prior to admission? : No Access to Means: No What has been your use of drugs/alcohol within the last 12 months?:  (alcohol and ) Previous Attempts/Gestures: Yes How many times?:  (1x in 2017) Other Self Harm Risks:  (no self harm risk ) Triggers for Past Attempts:  (no past attempts ) Intentional Self Injurious Behavior: None Family Suicide History: No Persecutory voices/beliefs?: No Depression: Yes Depression Symptoms: Feeling worthless/self pity, Guilt, Fatigue, Loss of interest in usual pleasures Substance abuse history and/or treatment for substance abuse?: No Suicide prevention information given to non-admitted patients: Not applicable  Risk to Others within the past 6 months Homicidal Ideation: No Does patient have any lifetime risk of violence toward others beyond the six months prior to admission? : No Thoughts of Harm to Others: No Current Homicidal Intent: No Current Homicidal Plan: No Access to Homicidal Means: No Identified Victim:  (n/a) History of harm to others?: No Assessment of Violence: None Noted Violent Behavior Description:  (currently calm and cooperative ) Does patient have access to weapons?: No Criminal  Charges Pending?: No Does patient have a court date: No Is patient on probation?: No  Psychosis Hallucinations:  ("Only when I use drugs") Delusions: None noted  Mental Status Report Appearance/Hygiene: Disheveled Eye Contact: Good Motor Activity: Freedom of movement Speech: Logical/coherent Level of Consciousness: Alert Mood: Depressed, Sad Affect: Depressed Anxiety Level: Minimal Thought Processes: Relevant Judgement: Impaired Orientation: Person, Situation, Time, Place Obsessive Compulsive Thoughts/Behaviors: None  Cognitive Functioning Concentration: Normal Memory: Recent Intact, Remote Intact Is patient IDD: No Insight: Poor Impulse Control: Fair Appetite:  Good Have you had any weight changes? : No Change Sleep: Decreased Total Hours of Sleep:  (6 hrs per night ) Vegetative Symptoms: None  ADLScreening [facility] Assessment Services) Patient's cognitive ability adequate to safely complete daily activities?: Yes Patient able to express need for assistance with ADLs?: Yes Independently performs ADLs?: Yes (appropriate for developmental age)  Prior Inpatient Therapy Prior Inpatient Therapy: Yes (discharged from [facility] 08/12[facility]20) Prior Therapy Dates:  (x 2 mo's: [facility], [place], [facility], [place] ) Prior Therapy Facilty/Provider(s):  (x 30 days-[facility], [facility], [place] ) Reason for Treatment:  (substance use; SI, depression )  Prior Outpatient Therapy Prior Outpatient Therapy: No Does patient have an ACCT team?: No Does patient have Intensive In-House Services?  : No Does patient have [facility] services? : No Does patient have P4CC services?: No  ADL Screening (condition at time of admission) Patient's cognitive ability adequate to safely complete daily activities?: Yes Is the patient deaf or have difficulty hearing?: No Does the patient have difficulty seeing, even when wearing glasses/contacts?: No Does the patient have difficulty concentrating, remembering, or making decisions?: No Patient able to express need for assistance with ADLs?: Yes Does the patient have difficulty dressing or bathing?: No Independently performs ADLs?: Yes (appropriate for developmental age) Does the patient have difficulty walking or climbing stairs?: No Weakness of Legs: None Weakness of Arms/Hands: None  Home Assistive Devices/Equipment Home Assistive Devices/Equipment: None          Advance Directives (For Healthcare) Would patient like information on creating a medical advance directive?: No - Patient declined          Disposition: Denies current SI, HI, and AVH's. Psych cleared per Harriett Sine, NP.  She did not meet  criteria for an inpatient patient admission and was given referrals for substance abuse programs, per recommendations of Harriett Sine, NP. She also has a follow up appointment at the [facility] 09/05/2020 for med management and was informed of the walk in hours for the [facility] during the week.  On Site Evaluation by:   Reviewed with Physician:    Waldon Merl 08/16/2020 12:03 PM

## 2020-08-16 NOTE — ED Provider Notes (Signed)
[facility] DEPT Provider Note   CSN: [id number] Arrival date & time: 08/16/20  1047     History Chief Complaint  Patient presents with  . Suicidal    Latoya [name] is a 29 y.o. female.  The history is provided by the patient.  Mental Health Problem Presenting symptoms: suicidal thoughts   Degree of incapacity (severity):  Mild Onset quality:  Gradual Timing:  Intermittent Progression:  Waxing and waning Chronicity:  Recurrent Context: not noncompliant   Relieved by:  Nothing Worsened by:  Nothing Associated symptoms: no abdominal pain and no chest pain   Risk factors: hx of mental illness        Past Medical History:  Diagnosis Date  . Anemia   . Anxiety   . Arthritis   . Blood transfusion without reported diagnosis   . Crohn's disease ([place])   . Depression   . Drug-seeking behavior   . Gestational diabetes   . History of substance abuse ([place])   . HSV infection   . Pregnancy induced hypertension   . Substance abuse [facility])     Patient Active Problem List   Diagnosis Date Noted  . Heroin abuse ([place]) 07/10/2020  . Bipolar and related disorder due to another medical condition with mixed features 07/10/2020  . MDD (major depressive disorder) 07/06/2020  . Post partum depression 07/12/2017  . IBD (inflammatory bowel disease)   . Acute pancreatitis   . Iron deficiency anemia due to chronic blood loss   . Heme positive stool   . Pancolitis ([place]) 07/04/2017  . Generalized abdominal pain   . Diarrhea   . Major depressive disorder, recurrent severe without psychotic features ([place]) 06/29/2017  . Cocaine abuse ([place]) 06/29/2017  . SVD (spontaneous vaginal delivery) 05/15/2017  . Crack cocaine use 05/15/2017  . Cervical dysplasia 01/16/2017  . Obesity (BMI 30.0-34.9) 01/12/2017  . History of substance abuse ([place]) 01/12/2017  . History of gestational diabetes mellitus (GDM) 01/12/2017  . History of gestational hypertension  01/12/2017  . History of macrosomia in infant in prior pregnancy, currently pregnant 01/12/2017  . Short interval between pregnancies affecting pregnancy in second trimester, antepartum 01/12/2017  . Late prenatal care 01/12/2017  . Paxil use in early pregnancy 01/12/2017  . History of suicide attempt 01/12/2017  . Anxiety and depression 01/12/2017  . Supervision of high risk pregnancy, antepartum 01/10/2017  . GDM (gestational diabetes mellitus) 02/25/2016  . Obesity in pregnancy 02/25/2016  . Anemia 02/08/2016    Past Surgical History:  Procedure Laterality Date  . CHOLECYSTECTOMY    . COLONOSCOPY N/A 07/07/2017   Procedure: COLONOSCOPY;  Surgeon: Juanita Craver, MD;  Location: [facility] ENDOSCOPY;  Service: Endoscopy;  Laterality: N/A;  . DEEP NECK LYMPH NODE BIOPSY / EXCISION  2011     OB History    Gravida  2   Para  2   Term  2   Preterm  0   AB  0   Living  2     SAB  0   TAB  0   Ectopic  0   Multiple  0   Live Births  2           Family History  Problem Relation Age of Onset  . Diabetes Mother   . Hypertension Mother   . Hypertension Father   . Stroke Maternal Grandfather   . Colon cancer Neg Hx   . Stomach cancer Neg Hx     Social History  Tobacco Use  . Smoking status: Never Smoker  . Smokeless tobacco: Never Used  Vaping Use  . Vaping Use: Never used  Substance Use Topics  . Alcohol use: Not Currently  . Drug use: Yes    Types: "Crack" cocaine, Cocaine    Comment: cocai, former user, 06/27/2017    Home Medications Prior to Admission medications   Medication Sig Start Date End Date Taking? Authorizing Provider  busPIRone (BUSPAR) 15 MG tablet Take 1 tablet (15 mg total) by mouth 3 (three) times daily. 07/12/20   Niel Hummer, NP  [facility] MELATONIN 3 MG TABS tablet Take 2 tablets (6 mg total) by mouth at bedtime. 07/12/20   Niel Hummer, NP  FLUoxetine (PROZAC) 20 MG capsule Take 1 capsule (20 mg total) by mouth daily. 07/13/20   Niel Hummer, NP  hydrOXYzine (ATARAX/VISTARIL) 25 MG tablet Take 1 tablet (25 mg total) by mouth every 6 (six) hours as needed for anxiety. 07/12/20   Niel Hummer, NP  OXcarbazepine (TRILEPTAL) 150 MG tablet Take 3 tablets (450 mg total) by mouth 2 (two) times daily. 07/12/20   Niel Hummer, NP  pantoprazole (PROTONIX) 40 MG tablet Take 1 tablet (40 mg total) by mouth daily. 07/13/20   Niel Hummer, NP  QUEtiapine (SEROQUEL) 400 MG tablet Take 1 tablet (400 mg total) by mouth at bedtime. 07/12/20   Niel Hummer, NP  topiramate (TOPAMAX) 25 MG tablet Take 1 tablet (25 mg total) by mouth 2 (two) times daily. 07/12/20   Niel Hummer, NP  traZODone (DESYREL) 50 MG tablet Take 1 tablet (50 mg total) by mouth at bedtime as needed for sleep. 07/12/20   Niel Hummer, NP  hyoscyamine (LEVSIN SL) 0.125 MG SL tablet Place 1 tablet (0.125 mg total) every 4 (four) hours as needed under the tongue. Patient not taking: Reported on 07/03/2018 08/03/17 07/09/19  Levin Erp, PA  mesalamine (APRISO) 0.375 g 24 hr capsule Take 4 capsules (1.5 g total) by mouth daily. Patient not taking: Reported on 07/03/2018 11/26/17 07/09/19  Mauri Pole, MD  omeprazole (PRILOSEC) 40 MG capsule TAKE 1 CAPSULE (40 MG TOTAL) DAILY BY MOUTH. Patient not taking: Reported on 07/03/2018 11/19/17 07/09/19  Mauri Pole, MD  PARoxetine (PAXIL-CR) 25 MG 24 hr tablet Take 1 tablet (25 mg total) by mouth daily. Patient not taking: Reported on 07/03/2018 05/16/17 07/09/19  Emily Filbert, MD    Allergies    Patient has no known allergies.  Review of Systems   Review of Systems  Constitutional: Negative for chills and fever.  HENT: Negative for ear pain and sore throat.   Eyes: Negative for pain and visual disturbance.  Respiratory: Negative for cough and shortness of breath.   Cardiovascular: Negative for chest pain and palpitations.  Gastrointestinal: Negative for abdominal pain and vomiting.  Genitourinary:  Negative for dysuria and hematuria.  Musculoskeletal: Negative for arthralgias and back pain.  Skin: Negative for color change and rash.  Neurological: Negative for seizures and syncope.  Psychiatric/Behavioral: Positive for suicidal ideas.  All other systems reviewed and are negative.   Physical Exam Updated Vital Signs  ED Triage Vitals  Enc Vitals Group     BP 08/16/20 1053 122/84     Pulse Rate 08/16/20 1053 (!) 103     Resp 08/16/20 1053 20     Temp 08/16/20 1053 98.3 F (36.8 C)     Temp Source 08/16/20 1053 Oral  SpO2 08/16/20 1053 100 %     Weight 08/16/20 1053 200 lb (90.7 kg)     Height 08/16/20 1053 5' 10"  (1.778 m)     Head Circumference --      Peak Flow --      Pain Score 08/16/20 1111 0     Pain Loc --      Pain Edu? --      Excl. in [facility]? --     Physical Exam Vitals and nursing note reviewed.  Constitutional:      General: She is not in acute distress.    Appearance: She is well-developed. She is not ill-appearing.  HENT:     Head: Normocephalic and atraumatic.     Mouth/Throat:     Mouth: Mucous membranes are moist.  Eyes:     Extraocular Movements: Extraocular movements intact.     Conjunctiva/sclera: Conjunctivae normal.     Pupils: Pupils are equal, round, and reactive to light.  Cardiovascular:     Rate and Rhythm: Normal rate and regular rhythm.     Pulses: Normal pulses.     Heart sounds: Normal heart sounds. No murmur heard.   Pulmonary:     Effort: Pulmonary effort is normal. No respiratory distress.     Breath sounds: Normal breath sounds.  Abdominal:     Palpations: Abdomen is soft.     Tenderness: There is no abdominal tenderness.  Musculoskeletal:     Cervical back: Neck supple.  Skin:    General: Skin is warm and dry.  Neurological:     Mental Status: She is alert.  Psychiatric:        Thought Content: Thought content does not include homicidal or suicidal ideation. Thought content does not include homicidal or suicidal plan.      ED Results / Procedures / Treatments   Labs (all labs ordered are listed, but only abnormal results are displayed) Labs Reviewed - No data to display  EKG None  Radiology No results found.  Procedures Procedures (including critical care time)  Medications Ordered in ED Medications - No data to display  ED Course  I have reviewed the triage vital signs and the nursing notes.  Pertinent labs & imaging results that were available during my care of the patient were reviewed by me and considered in my medical decision making (see chart for details).    MDM Rules/Calculators/A&P                          Zayna Ajooni Karam is a 29 year old female with history of anxiety and depression who presents to the ED with mental health problem.  Patient here for suicidal ideation and having difficulty with sleep.  Took some extra doses of her Seroquel last night at around 11 PM.  That seemed to help a little bit.  She was actually evaluated by mental health provider prior to my evaluation and they have already cleared her for discharge from a psychiatric standpoint.  Patient was thought to be malingering per psychiatric provider.  Overall she appears well.  EKG shows sinus rhythm.  No abnormal QTC.  Suspect that she is medically cleared from any potential harm from extra doses of Seroquel last night as it is about 12 hours since her last dose.  She has normal vitals.  EKG reassuring.  Has already been given outpatient counseling and substance abuse counseling by psychiatric team.  Discharged in good condition.  She does  not endorse any specific SI or HI to me and overall appears well.  This chart was dictated using voice recognition software.  Despite best efforts to proofread,  errors can occur which can change the documentation meaning.   Final Clinical Impression(s) / ED Diagnoses Final diagnoses:  Polysubstance abuse [facility])    Rx / DC Orders ED Discharge Orders    None         Lennice Sites, DO 08/16/20 1146

## 2020-08-16 NOTE — H&P (Signed)
Behavioral Health Medical Screening Exam  Latoya [name] is an 29 y.o. female. She is presenting requesting assistance with substance use for heroin and cocaine. Her last use of heroin was one month ago. Last use of cocaine yesterday. She was recently admitted to [facility] 07/06/20-07/12/20. She was discharged directly to [facility]. Shortly after discharge from [place] she presented to [facility] ED and was sent to [facility]. She discharged from [facility] on Thursday and reports relapsing on cocaine after discharge.  She denies any SI/HI/AVH. She shows no signs of responding to internal stimuli. No paranoid or delusional thought content expressed. Patient laid down on bench in assessment room and refused to sit up during assessment. She is requesting inpatient hospitalization. Patient advised that she does not meet inpatient criteria at this time as she is not acutely suicidal, homicidal or psychotic. Patient provided with substance use resources. She also has upcoming outpatient appointment at the [facility].   Of note, I was informed by [facility] EDP that patient presented to [facility]ED directly after being discharged from here, reporting SI. I informed patient appears to be malingering for secondary gain of housing and does not meet criteria for inpatient psychiatric hospitalization.  Total Time spent with patient: 20 minutes  Psychiatric Specialty Exam: Physical Exam Vitals reviewed.  Constitutional:      Appearance: She is well-developed.  Cardiovascular:     Rate and Rhythm: Normal rate.  Pulmonary:     Effort: Pulmonary effort is normal.  Neurological:     Mental Status: She is alert and oriented to person, place, and time.    Review of Systems  Constitutional: Negative.   Respiratory: Negative for cough and shortness of breath.   Psychiatric/Behavioral: Positive for dysphoric mood. Negative for agitation, behavioral problems, confusion, decreased concentration, hallucinations, self-injury, sleep  disturbance and suicidal ideas. The patient is not nervous/anxious and is not hyperactive.    Blood pressure 116/70, pulse (!) 103, temperature 98.5 F (36.9 C), temperature source Oral, resp. rate 18, unknown if currently breastfeeding.There is no height or weight on file to calculate BMI. General Appearance: Fairly Groomed Eye Contact:  Fair Speech:  Normal Rate Volume:  Normal Mood:  Depressed Affect:  Congruent Thought Process:  Coherent and Goal Directed Orientation:  Full (Time, Place, and Person) Thought Content:  Logical Suicidal Thoughts:  No Homicidal Thoughts:  No Memory:  Immediate;   Fair Recent;   Fair Remote;   Fair Judgement:  Fair Insight:  Fair Psychomotor Activity:  Normal Concentration: Concentration: Fair and Attention Span: Fair Recall:  [employer] of Knowledge:Fair Language: Fair Akathisia:  No Handed:  Right AIMS (if indicated):    Assets:  Agricultural consultant Leisure Time Resilience Sleep:     Musculoskeletal: Strength & Muscle Tone: within normal limits Gait & Station: normal Patient leans: N/A  Blood pressure 116/70, pulse (!) 103, temperature 98.5 F (36.9 C), temperature source Oral, resp. rate 18, unknown if currently breastfeeding.  Recommendations: Based on my evaluation the patient does not appear to have an emergency medical condition.  Substance use and outpatient referrals.  Connye Burkitt, NP 08/16/2020, 10:45 AM

## 2020-09-07 ENCOUNTER — Telehealth (HOSPITAL_COMMUNITY): Payer: Medicaid Other

## 2020-10-22 ENCOUNTER — Encounter (HOSPITAL_COMMUNITY): Payer: Self-pay | Admitting: Psychiatry

## 2020-10-22 ENCOUNTER — Inpatient Hospital Stay (HOSPITAL_COMMUNITY)
Admission: AD | Admit: 2020-10-22 | Discharge: 2020-10-25 | DRG: 885 | Disposition: A | Discharge status: Home / Self Care | Payer: Medicaid Other | Attending: Psychiatry | Admitting: Psychiatry

## 2020-10-22 ENCOUNTER — Inpatient Hospital Stay (HOSPITAL_COMMUNITY): Admission: RE | Admit: 2020-10-22 | Payer: Medicaid Other | Source: Intra-hospital | Admitting: Psychiatry

## 2020-10-22 ENCOUNTER — Other Ambulatory Visit: Payer: Self-pay

## 2020-10-22 DIAGNOSIS — Z823 Family history of stroke: Secondary | ICD-10-CM | POA: Diagnosis not present

## 2020-10-22 DIAGNOSIS — Z5900 Homelessness unspecified: Secondary | ICD-10-CM

## 2020-10-22 DIAGNOSIS — F3160 Bipolar disorder, current episode mixed, unspecified: Secondary | ICD-10-CM | POA: Diagnosis present

## 2020-10-22 DIAGNOSIS — M1990 Unspecified osteoarthritis, unspecified site: Secondary | ICD-10-CM | POA: Diagnosis present

## 2020-10-22 DIAGNOSIS — R45851 Suicidal ideations: Secondary | ICD-10-CM | POA: Diagnosis present

## 2020-10-22 DIAGNOSIS — K5090 Crohn's disease, unspecified, without complications: Secondary | ICD-10-CM | POA: Diagnosis present

## 2020-10-22 DIAGNOSIS — F329 Major depressive disorder, single episode, unspecified: Secondary | ICD-10-CM | POA: Diagnosis present

## 2020-10-22 DIAGNOSIS — G4700 Insomnia, unspecified: Secondary | ICD-10-CM | POA: Diagnosis present

## 2020-10-22 DIAGNOSIS — Z833 Family history of diabetes mellitus: Secondary | ICD-10-CM

## 2020-10-22 DIAGNOSIS — F419 Anxiety disorder, unspecified: Secondary | ICD-10-CM | POA: Diagnosis present

## 2020-10-22 DIAGNOSIS — Z8249 Family history of ischemic heart disease and other diseases of the circulatory system: Secondary | ICD-10-CM

## 2020-10-22 DIAGNOSIS — F1920 Other psychoactive substance dependence, uncomplicated: Secondary | ICD-10-CM | POA: Diagnosis present

## 2020-10-22 DIAGNOSIS — F32A Depression, unspecified: Secondary | ICD-10-CM | POA: Insufficient documentation

## 2020-10-22 DIAGNOSIS — Z20822 Contact with and (suspected) exposure to covid-19: Secondary | ICD-10-CM | POA: Diagnosis present

## 2020-10-22 LAB — SARS CORONAVIRUS 2 BY RT PCR (HOSPITAL ORDER, PERFORMED IN ~~LOC~~ HOSPITAL LAB): SARS Coronavirus 2: NEGATIVE

## 2020-10-22 MED ORDER — NAPROXEN 500 MG PO TABS
500.0000 mg | ORAL_TABLET | Freq: Two times a day (BID) | ORAL | Status: DC | PRN
  Administered 2020-10-24: 500 mg via ORAL
  Filled 2020-10-22: qty 1

## 2020-10-22 MED ORDER — ONDANSETRON 4 MG PO TBDP: 4.0000 mg | ORAL_TABLET | Freq: Four times a day (QID) | ORAL | Status: DC | PRN

## 2020-10-22 MED ORDER — OLANZAPINE 5 MG PO TBDP: 5.0000 mg | ORAL_TABLET | Freq: Three times a day (TID) | ORAL | Status: DC | PRN

## 2020-10-22 MED ORDER — HYDROXYZINE HCL 25 MG PO TABS
25.0000 mg | ORAL_TABLET | Freq: Four times a day (QID) | ORAL | Status: DC | PRN
  Administered 2020-10-23 – 2020-10-24 (×3): 25 mg via ORAL
  Filled 2020-10-22 (×3): qty 1

## 2020-10-22 MED ORDER — DICYCLOMINE HCL 20 MG PO TABS: 20.0000 mg | ORAL_TABLET | Freq: Four times a day (QID) | ORAL | Status: DC | PRN

## 2020-10-22 MED ORDER — FOLIC ACID 1 MG PO TABS
1.0000 mg | ORAL_TABLET | Freq: Every day | ORAL | Status: DC
  Administered 2020-10-22 – 2020-10-25 (×3): 1 mg via ORAL
  Filled 2020-10-22 (×7): qty 1

## 2020-10-22 MED ORDER — LOPERAMIDE HCL 2 MG PO CAPS: 2.0000 mg | ORAL_CAPSULE | ORAL | Status: DC | PRN

## 2020-10-22 MED ORDER — CLONIDINE HCL 0.1 MG PO TABS
0.1000 mg | ORAL_TABLET | Freq: Four times a day (QID) | ORAL | Status: AC
  Administered 2020-10-22 – 2020-10-24 (×7): 0.1 mg via ORAL
  Filled 2020-10-22 (×11): qty 1

## 2020-10-22 MED ORDER — ZIPRASIDONE MESYLATE 20 MG IM SOLR: 20.0000 mg | INTRAMUSCULAR | Status: DC | PRN

## 2020-10-22 MED ORDER — CLONIDINE HCL 0.1 MG PO TABS
0.1000 mg | ORAL_TABLET | Freq: Every day | ORAL | Status: DC
  Filled 2020-10-22: qty 1

## 2020-10-22 MED ORDER — LORAZEPAM 1 MG PO TABS: 1.0000 mg | ORAL_TABLET | Freq: Four times a day (QID) | ORAL | Status: DC | PRN

## 2020-10-22 MED ORDER — THIAMINE HCL 100 MG PO TABS
100.0000 mg | ORAL_TABLET | Freq: Every day | ORAL | Status: DC
  Administered 2020-10-22 – 2020-10-25 (×3): 100 mg via ORAL
  Filled 2020-10-22 (×7): qty 1

## 2020-10-22 MED ORDER — TRAZODONE HCL 50 MG PO TABS
50.0000 mg | ORAL_TABLET | Freq: Every evening | ORAL | Status: DC | PRN
  Administered 2020-10-22 – 2020-10-24 (×3): 50 mg via ORAL
  Filled 2020-10-22 (×3): qty 1

## 2020-10-22 MED ORDER — METHOCARBAMOL 500 MG PO TABS
500.0000 mg | ORAL_TABLET | Freq: Three times a day (TID) | ORAL | Status: DC | PRN
  Administered 2020-10-23 (×2): 500 mg via ORAL
  Filled 2020-10-22 (×2): qty 1

## 2020-10-22 MED ORDER — LORAZEPAM 1 MG PO TABS: 1.0000 mg | ORAL_TABLET | ORAL | Status: DC | PRN

## 2020-10-22 MED ORDER — CLONIDINE HCL 0.1 MG PO TABS
0.1000 mg | ORAL_TABLET | ORAL | Status: DC
  Administered 2020-10-25: 0.1 mg via ORAL
  Filled 2020-10-22 (×4): qty 1

## 2020-10-22 MED ORDER — ALUM & MAG HYDROXIDE-SIMETH 200-200-20 MG/5ML PO SUSP
30.0000 mL | ORAL | Status: DC | PRN
  Filled 2020-10-22: qty 30

## 2020-10-22 NOTE — Progress Notes (Signed)
Admission Note: Patient is a 30 year old female who presents as a walk-in for symptoms of depression and suicidal ideation with plan to overdose on pills.  Patient presents with a flat affect and depressed mood.  Patient was minimal and only forward little information.  Admission plan of care reviewed and consent for treatment signed.  Skin assessment and personal belongings completed.  Skin is dry and intact.  No contraband found.  Patient oriented to the unit, staff and room.  Routine safety checks initiated.  Verbalizes understanding of unit rules and protocols.  Patient is safe on the unit.

## 2020-10-22 NOTE — H&P (Signed)
Behavioral Health Medical Screening Exam  Latoya [name] is an 30 y.o. female who presents to [facility] alone as walk-in for assessment of suicidal ideations with plan to overdose. Patient reports history of crack cocaine and opioid addiction; last use of crack cocaine x2 days ago, opioids 04/2020. Pt reports recently spending 18 days in jail related to "something that happened when I was high" and not being without her psychiatric medications. Patient presents depressed, flat, and withdrawn.   Patient reports decreased sleep, concentration and energy with increased feelings of anhedonia, guilt and worthlessness, and suicidal thoughts with intent and plan. Patient has past history of depression, substance abuse, and both intentional/unintentional overdoses with several ED encounters and psychiatric admissions.     Based on my assessment and patient's current presentation. I recommend inpatient psychiatric hospitalization.   Total Time spent with patient: 20 minutes  Psychiatric Specialty Exam: Physical Exam Vitals and nursing note reviewed.  Psychiatric:        Attention and Perception: Attention normal.        Mood and Affect: Mood is depressed. Affect is flat.        Speech: Speech normal.        Behavior: Behavior is withdrawn. Behavior is cooperative.        Thought Content: Thought content includes suicidal ideation. Thought content includes suicidal plan.        Cognition and Memory: Cognition normal.        Judgment: Judgment is impulsive.    Review of Systems  Psychiatric/Behavioral: Positive for decreased concentration, sleep disturbance and suicidal ideas.   unknown if currently breastfeeding.There is no height or weight on file to calculate BMI. General Appearance: Disheveled Eye Contact:  Fair Speech:  Clear and Coherent Volume:  Decreased Mood:  Depressed Affect:  Constricted Thought Process:  Linear Orientation:  Full (Time, Place, and Person) Thought Content:   Logical Suicidal Thoughts:  Yes.  with intent/plan Homicidal Thoughts:  No Memory:  Immediate;   Fair Judgement:  Poor Insight:  Shallow Psychomotor Activity:  Normal Concentration: Concentration: Fair and Attention Span: Fair Recall:  [place]: Fair Akathisia:  NA Handed:  Right AIMS (if indicated):    Assets:  Communication Skills Desire for Improvement Resilience Social Support Sleep:     Musculoskeletal: Strength & Muscle Tone: within normal limits Gait & Station: normal Patient leans: N/A  unknown if currently breastfeeding.  Recommendations: Based on my evaluation the patient does not appear to have an emergency medical condition.Based on my evaluation this patient does appear to meet inpatient psychiatric hospitalization criteria.   Inda Merlin, NP 10/22/2020, 10:54 AM

## 2020-10-22 NOTE — Tx Team (Signed)
Initial Treatment Plan 10/22/2020 5:18 PM Nikiyah Fackler GYF:[id number]    PATIENT STRESSORS: Financial difficulties Health problems Medication change or noncompliance Substance abuse   PATIENT STRENGTHS: Ability for insight Average or above average intelligence Capable of independent living Communication skills   PATIENT IDENTIFIED PROBLEMS: "Safety"  Depression  Suicidal thoughts  Medication noncompliance               DISCHARGE CRITERIA:  Ability to meet basic life and health needs Adequate post-discharge living arrangements Safe-care adequate arrangements made  PRELIMINARY DISCHARGE PLAN: Attend aftercare/continuing care group Outpatient therapy Return to previous living arrangement  PATIENT/FAMILY INVOLVEMENT: This treatment plan has been presented to and reviewed with the patient, Annamay Laymon Elbaum.  The patient and family have been given the opportunity to ask questions and make suggestions.  Vela Prose, RN 10/22/2020, 5:18 PM

## 2020-10-22 NOTE — Progress Notes (Signed)
Patient attended the evening A.A. meeting and was appropriate.
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
## 2020-10-23 DIAGNOSIS — F3160 Bipolar disorder, current episode mixed, unspecified: Secondary | ICD-10-CM

## 2020-10-23 MED ORDER — MIRTAZAPINE 15 MG PO TABS
15.0000 mg | ORAL_TABLET | Freq: Every day | ORAL | Status: DC
  Administered 2020-10-23 – 2020-10-24 (×2): 15 mg via ORAL
  Filled 2020-10-23 (×4): qty 1

## 2020-10-23 MED ORDER — QUETIAPINE FUMARATE 400 MG PO TABS
400.0000 mg | ORAL_TABLET | Freq: Every day | ORAL | Status: DC
  Administered 2020-10-23 – 2020-10-24 (×2): 400 mg via ORAL
  Filled 2020-10-23 (×4): qty 1
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
## 2020-10-23 NOTE — BHH Suicide Risk Assessment (Signed)
[facility] Admission Suicide Risk Assessment   Nursing information obtained from:  Patient Demographic factors:  Adolescent or young adult Current Mental Status:  Self-harm thoughts Loss Factors:  Financial problems / change in socioeconomic status Historical Factors:  NA Risk Reduction Factors:  NA  Total Time spent with patient: 30 minutes Principal Problem: <principal problem not specified> Diagnosis:  Active Problems:   * No active hospital problems. *  Subjective Data: Patient is seen and examined.  Patient is a 30 year old female with a past psychiatric history significant for polysubstance dependence who presented to the behavioral health hospital as a walk-in on 10/22/2020.  The patient had recently been released from jail on 10/20/2020.  She immediately went to start using drugs again.  She claimed to have used cocaine and opioids.  She stated that she was suicidal.  The assessing team at that time decided for admission.  I am familiar with the patient.  The patient had been admitted to the psychiatric hospital in October 2021.  She was suicidal at that time.  At that time she admitted that she had recently been discharged from the [facility] for substance abuse treatment in [place], [place].  She also was to going to a sober living facility in [facility], but had relapsed and was not accepted into that facility.  She stated that she was in jail for approximately 19 days.  She stated she was in jail for trespassing.  She was apparently going on a property to use drugs.  She has had multiple attempts in rehabilitation facilities, but always leaves early.  She apparently went to [place] sometime in 2021.  At least it appears that she went after she was discharged from our facility.  She stayed there somewhere between 10 to 14 days because she wanted to get high.  She is currently homeless, but stated her mother is somewhat supportive.  She has been treated with multiple medications in  the past including fluoxetine, Trileptal, Seroquel, hydroxyzine, Topamax.  She also had been previously treated with buspirone.  She stated the longest period of time that she had a sobriety was the 19 days that she was recently in jail.  She denied any current charges or court dates.  She was admitted to the hospital for evaluation and stabilization.  Continued Clinical Symptoms:  Alcohol Use Disorder Identification Test Final Score (AUDIT): 0 The "Alcohol Use Disorders Identification Test", Guidelines for Use in Primary Care, Second Edition.  [employer] [facility]). Score between 0-7:  no or low risk or alcohol related problems. Score between 8-15:  moderate risk of alcohol related problems. Score between 16-19:  high risk of alcohol related problems. Score 20 or above:  warrants further diagnostic evaluation for alcohol dependence and treatment.   CLINICAL FACTORS:   Alcohol/Substance Abuse/Dependencies   Musculoskeletal: Strength & Muscle Tone: within normal limits Gait & Station: normal Patient leans: N/A  Psychiatric Specialty Exam: Physical Exam Vitals and nursing note reviewed.  Constitutional:      Appearance: Normal appearance.  HENT:     Head: Normocephalic and atraumatic.  Pulmonary:     Effort: Pulmonary effort is normal.  Neurological:     General: No focal deficit present.     Mental Status: She is alert and oriented to person, place, and time.     Review of Systems  Blood pressure 105/70, pulse 87, temperature 98.3 F (36.8 C), temperature source Oral, resp. rate 18, height 5' 10"  (1.778 m), weight 84.4  kg, SpO2 100 %, unknown if currently breastfeeding.Body mass index is 26.69 kg/m.  General Appearance: Casual  Eye Contact:  Fair  Speech:  Normal Rate  Volume:  Normal  Mood:  Anxious  Affect:  Congruent  Thought Process:  Coherent and Descriptions of Associations: Circumstantial  Orientation:  Full (Time, Place, and Person)  Thought Content:   Logical  Suicidal Thoughts:  No  Homicidal Thoughts:  No  Memory:  Immediate;   Poor Recent;   Poor Remote;   Poor  Judgement:  Impaired  Insight:  Lacking  Psychomotor Activity:  Normal  Concentration:  Concentration: Good and Attention Span: Good  Recall:  Good  Fund of Knowledge:  Good  Language:  Good  Akathisia:  Negative  Handed:  Right  AIMS (if indicated):     Assets:  Desire for Improvement Resilience  ADL's:  Intact  Cognition:  WNL  Sleep:  Number of Hours: 5.25      COGNITIVE FEATURES THAT CONTRIBUTE TO RISK:  Thought constriction (tunnel vision)    SUICIDE RISK:   Minimal: No identifiable suicidal ideation.  Patients presenting with no risk factors but with morbid ruminations; may be classified as minimal risk based on the severity of the depressive symptoms  PLAN OF CARE: Patient is seen and examined.  Patient is a 30 year old female with the above-stated past psychiatric history is admitted secondary to suicidal ideation.  I suspect there is a great deal of secondary gain here.  She just got out of jail, is essentially homeless, and clearly the weather outside with snow and ice is poor.  She was acting out last night with regard to staff, and I discussed with her the need to abide by the rules of the unit.  She was willing to leave the unit today, but I asked her to stay for at least 72 hours to make sure that her withdrawal symptoms were minimal and to get her on a different antidepressant medication.  She stated she had been taking Seroquel 600 mg p.o. nightly when she was released from [facility].  On her last psychiatric hospitalization she reportedly had overdosed on Seroquel, but she was [facility] with Seroquel 400 mg p.o. nightly.  I will restart the 400 mg p.o. nightly and as well start her on mirtazapine 15 mg p.o. nightly to assist with mood and sleep.  She also has a history of an inflammatory bowel disease, and on her previous  psychiatric hospitalization we attempted to get her treated but she was asymptomatic at that time.  She is asymptomatic at this time.  Unfortunately we have no data on her and all from a lab standpoint outside of a negative corona test.  We have no drug screen or anything else.  We will attempt to get those today.  Given her drug history we will put her on the opiate detox protocol as well as lorazepam 1 mg p.o. every 6 hours as needed with a CIWA greater than 10.  She will also have available hydroxyzine as well as trazodone.  Her vital signs are stable, she is afebrile.  It does not appear that she is in any withdrawal syndromes.  Her pulse oximetry on room air is 100%.  I certify that inpatient services furnished can reasonably be expected to improve the patient's condition.   Sharma Covert, MD 10/23/2020, 11:15 AM

## 2020-10-23 NOTE — Progress Notes (Signed)
The patient became very upset shortly after the dayroom was closed for the evening. She along with a few of her peers refused to go to their room's for the night. At one point, this author observed the patient dragging a chair back to her room. She insisted that she would be sitting up all night long in that chair. The chair was taken away. She became verbally abusive with her nurse calling her names, ie. "bitch" and "nigger". The patient also verbally threatened her nurse as well. She had to be redirected for trying to talk to her peer in the hallway.

## 2020-10-23 NOTE — Progress Notes (Signed)
   10/23/20 0930  Psych Admission Type (Psych Patients Only)  Admission Status Voluntary  Psychosocial Assessment  Patient Complaints Irritability  Eye Contact Brief  Facial Expression Animated  Affect Anxious;Angry  Speech Logical/coherent;Soft  Interaction Assertive;Attention-seeking;Demanding  Motor Activity Slow  Appearance/Hygiene Unremarkable  Behavior Characteristics Agressive verbally  Mood Irritable  Thought Process  Coherency WDL  Content WDL  Delusions None reported or observed  Perception WDL  Hallucination None reported or observed  Judgment WDL  Confusion None  Danger to Self  Current suicidal ideation? Denies  Self-Injurious Behavior No self-injurious ideation or behavior indicators observed or expressed   Agreement Not to Harm Self Yes  Description of Agreement verbal contract  Danger to Others  Danger to Others None reported or observed

## 2020-10-23 NOTE — Progress Notes (Signed)
D.  Pt was overheard engaged in inappropriately sexual conversation with female peer.  Later same female peer went into Pt's room, but was stopped in the doorway by staff member.    A. Staff member spoke at length with both Pts about this inappropriate behavior.  Pt was defensive during conversation.  Hall being monitored by camera at [employer] as well as by MHT assigned to hall.  When either of those staff members are not available to monitor hall, this staff member will sit in their place.  R.  Pt verbalized understanding.   Will continue to closely monitor.

## 2020-10-23 NOTE — Progress Notes (Signed)
Patient remained in doorway after bedtime talking to another patient across the hall. Both RN and MHT asked repeatedly to try to rest. Patient refused and told RN that they do not tell her what to do. After RN attempted redirection of behavior patient became increasingly verbally aggressive towards RN and called RN a "bitch" and told RN she would "beat her ass." Patient eventually went to room after other staff assisted with redirection of behavior.

## 2020-10-23 NOTE — Progress Notes (Incomplete)
Writer was sitting at the NS and was watching the hall from the camera when they observed a female pt go into pt room. Writer immediately went to the pt room and told the female pt to leave their room. Writer had a private discussion with pt and told them they cannot go into any pt room or allow another pt besides their roommate in their room whether female or female. Writer explained the risks of going into another pt room and some consequences that could result. Pt replied "I didn't go into anybody's room" and writer reiterated their initial statement. When asked if pt understood pt nodded to Probation officer and appeared to Armed forces operational officer. Througout the night writer has witness pt and the same female pt fraternizing in the dayroom and speaking sexual towards each other. Writer overheard female pt say to pt "I'm ready to go half on a baby", referring to making a baby.

## 2020-10-23 NOTE — Progress Notes (Signed)
   10/23/20 2237  COVID-19 Daily Checkoff  Have you had a fever (temp > 37.80C/100F)  in the past 24 hours?  No  If you have had runny nose, nasal congestion, sneezing in the past 24 hours, has it worsened? No  COVID-19 EXPOSURE  Have you traveled outside the state in the past 14 days? No  Have you been in contact with someone with a confirmed diagnosis of COVID-19 or PUI in the past 14 days without wearing appropriate PPE? No  Have you been living in the same home as a person with confirmed diagnosis of COVID-19 or a PUI (household contact)? No  Have you been diagnosed with COVID-19? No

## 2020-10-23 NOTE — Progress Notes (Signed)
D: Patient presents with irritable affect but is cooperative at time of assessment. Patient is positive for passive SI but denies HI. Patient denies AH/VH at this time. Patient contracts for safety.  A: Provided positive reinforcement and encouragement.  R: Patient cooperative and receptive to efforts. Patient remains safe on the unit.   10/22/20 2154  Psych Admission Type (Psych Patients Only)  Admission Status Voluntary  Psychosocial Assessment  Patient Complaints None  Eye Contact Brief  Facial Expression Animated  Affect Blunted  Speech Logical/coherent;Soft  Interaction Assertive;Attention-seeking;Demanding  Motor Activity Slow  Appearance/Hygiene Unremarkable  Behavior Characteristics Agressive verbally  Mood Irritable  Thought Process  Coherency WDL  Content WDL  Delusions None reported or observed  Perception WDL  Hallucination None reported or observed  Judgment WDL  Confusion None  Danger to Self  Current suicidal ideation? Passive  Self-Injurious Behavior No self-injurious ideation or behavior indicators observed or expressed   Agreement Not to Harm Self Yes  Description of Agreement verbal contract  Danger to Others  Danger to Others None reported or observed

## 2020-10-23 NOTE — H&P (Signed)
Psychiatric Admission Assessment Adult  Patient Identification: Latoya [name]  MRN:  [id number]  Date of Evaluation:  10/23/2020  Chief Complaint:  Worsening symptoms of depression, relapse on drugs (cocaine & opioid) & suicidal ideations.   Diagnosis:  Principal Problem:   Bipolar I disorder, most recent episode mixed ([place]) Active Problems:   MDD (major depressive disorder)  History of Present Illness: (Per Md's admission SRA notes):  Patient is seen and examined. Patient is a 29 year old female with a past psychiatric history significant for polysubstance dependence who presented to the behavioral health hospital as a walk-in on 10/22/2020.  The patient had recently been released from jail on 10/20/2020.  She immediately went to start using drugs again.  She claimed to have used cocaine and opioids.  She stated that she was suicidal. The assessing team at that time decided for admission.  I am familiar with the patient.  The patient had been admitted to the psychiatric hospital in October 2021.  She was suicidal at that time.  At that time she admitted that she had recently been discharged from the [facility] for substance abuse treatment in [place], [place].  She also was to going to a sober living facility in [facility], but had relapsed and was not accepted into that facility.  She stated that she was in jail for approximately 19 days.  She stated she was in jail for trespassing.  She was apparently going on a property to use drugs.  She has had multiple attempts in rehabilitation facilities, but always leaves early.  She apparently went to [place] sometime in 2021.  At least it appears that she went after she was discharged from our facility.  She stayed there somewhere between 10 to 14 days because she wanted to get high.  She is currently homeless, but stated her mother is somewhat supportive.  She has been treated with multiple medications in the past including  fluoxetine, Trileptal, Seroquel, hydroxyzine, Topamax.  She also had been previously treated with buspirone.  She stated the longest period of time that she had a sobriety was the 19 days that she was recently in jail.  She denied any current charges or court dates.  She was admitted to the hospital for evaluation and stabilization.  Associated Signs/Symptoms: Depression Symptoms:  depressed mood, insomnia, psychomotor agitation, difficulty concentrating, hopelessness, suicidal thoughts with specific plan, anxiety,  Duration of Depression Symptoms: Greater than two weeks   (Hypo) Manic Symptoms:  Impulsivity, Irritable Mood, Labiality of Mood,  Anxiety Symptoms:  Excessive Worry,  Psychotic Symptoms:  Denied  Duration of Psychotic Symptoms: N/A  PTSD Symptoms: Had a traumatic exposure:  Per report  Total Time spent with patient: 1 hour  Past Psychiatric History: Patient has apparently been in multiple emergency rooms and been in rehabilitation facilities in the past.  She has been to the the 4-week program at [facility].  She has been on psychiatric medications in the past including Seroquel, Paxil, Rexulti, BuSpar, fluoxetine, hydroxyzine, quetiapine, topiramate.  Is the patient at risk to self? Yes.    Has the patient been a risk to self in the past 6 months? Yes.    Has the patient been a risk to self within the distant past? No.  Is the patient a risk to others? No.  Has the patient been a risk to others in the past 6 months? No.  Has the patient been a risk to others within the distant past? No.  Prior Inpatient Therapy: Yes, [facility] previously in Nov. 2021. Prior Outpatient Therapy: Yes.  Alcohol Screening: Patient refused Alcohol Screening Tool: Yes 1. How often do you have a drink containing alcohol?: Never 2. How many drinks containing alcohol do you have on a typical day when you are drinking?: 1 or 2 3. How often do you have six or more drinks on one occasion?:  Never AUDIT-C Score: 0 4. How often during the last year have you found that you were not able to stop drinking once you had started?: Never 5. How often during the last year have you failed to do what was normally expected from you because of drinking?: Never 6. How often during the last year have you needed a first drink in the morning to get yourself going after a heavy drinking session?: Never 7. How often during the last year have you had a feeling of guilt of remorse after drinking?: Never 8. How often during the last year have you been unable to remember what happened the night before because you had been drinking?: Never 9. Have you or someone else been injured as a result of your drinking?: No 10. Has a relative or friend or a doctor or another health worker been concerned about your drinking or suggested you cut down?: No Alcohol Use Disorder Identification Test Final Score (AUDIT): 0 Alcohol Brief Interventions/Follow-up: Patient Refused  Substance Abuse History in the last 12 months:  Yes.    Consequences of Substance Abuse: Medical Consequences:  Liver damage, Possible death by overdose Legal Consequences:  Arrests, jail time, Loss of driving privilege. Family Consequences:  Family discord, divorce and or separation.  Previous Psychotropic Medications: Yes   Psychological Evaluations: No   Past Medical History:  Past Medical History:  Diagnosis Date  . Anemia   . Anxiety   . Arthritis   . Blood transfusion without reported diagnosis   . Crohn's disease ([place])   . Depression   . Drug-seeking behavior   . Gestational diabetes   . History of substance abuse ([place])   . HSV infection   . Pregnancy induced hypertension   . Substance abuse [facility])     Past Surgical History:  Procedure Laterality Date  . CHOLECYSTECTOMY    . COLONOSCOPY N/A 07/07/2017   Procedure: COLONOSCOPY;  Surgeon: Juanita Craver, MD;  Location: [facility] ENDOSCOPY;  Service: Endoscopy;  Laterality: N/A;  .  DEEP NECK LYMPH NODE BIOPSY / EXCISION  2011   Family History:  Family History  Problem Relation Age of Onset  . Diabetes Mother   . Hypertension Mother   . Hypertension Father   . Stroke Maternal Grandfather   . Colon cancer Neg Hx   . Stomach cancer Neg Hx    Family Psychiatric  History: Noncontributory  Tobacco Screening: Have you used any form of tobacco in the last 30 days? (Cigarettes, Smokeless Tobacco, Cigars, and/or Pipes): Patient Refused Screening  Social History:  Social History   Substance and Sexual Activity  Alcohol Use Not Currently     Social History   Substance and Sexual Activity  Drug Use Yes  . Types: "Crack" cocaine, Cocaine   Comment: cocai, former user, 06/27/2017    Additional Social History: Marital status: Single Does patient have children?: Yes How many children?: 2 How is patient's relationship with their children?: Pt's children reside with Pt's mother; pt states she is living with mother also- "as long as she is clean and working on getting tx"  Pain Medications: See MAR Prescriptions: See MAR- has not had meds in over a month. did not get meds in jail bc did not want to tell jail she took mental health meds Over the Counter: See MAR History of alcohol / drug use?: Yes Negative Consequences of Use: Personal relationships,Legal,Financial,Work / School Name of Substance 1: crack cocaine 1 - Age of First Use: 23 1 - Last Use / Amount: 10/20/20 day she was released from jail Name of Substance 2: opioids 2 - Age of First Use: 31 yrs old 2 - Last Use / Amount: August 2021  Allergies:  No Known Allergies  Lab Results:  Results for orders placed or performed during the hospital encounter of 10/22/20 (from the past 48 hour(s))  SARS Coronavirus 2 by RT PCR (hospital order, performed in [place] hospital lab)     Status: None   Collection Time: 10/22/20 11:20 AM  Result Value Ref Range   SARS Coronavirus 2 NEGATIVE NEGATIVE    Comment:  (NOTE) SARS-CoV-2 target nucleic acids are NOT DETECTED.  The SARS-CoV-2 RNA is generally detectable in upper and lower respiratory specimens during the acute phase of infection. The lowest concentration of SARS-CoV-2 viral copies this assay can detect is 250 copies / mL. A negative result does not preclude SARS-CoV-2 infection and should not be used as the sole basis for treatment or other patient management decisions.  A negative result may occur with improper specimen collection / handling, submission of specimen other than nasopharyngeal swab, presence of viral mutation(s) within the areas targeted by this assay, and inadequate number of viral copies (<250 copies / mL). A negative result must be combined with clinical observations, patient history, and epidemiological information.  Fact Sheet for Patients:   StrictlyIdeas.no  Fact Sheet for Healthcare Providers: BankingDealers.co.za  This test is not yet approved or  cleared by the [place] FDA and has been authorized for detection and/or diagnosis of SARS-CoV-2 by FDA under an Emergency Use Authorization (EUA).  This EUA will remain in effect (meaning this test can be used) for the duration of the COVID-19 declaration under Section 564(b)(1) of the Act, 21 U.S.C. section 360bbb-3(b)(1), unless the authorization is terminated or revoked sooner.  Performed at [facility], [place] [address], [place], [place] [address]    Blood Alcohol level:  Lab Results  Component Value Date   [facility] <10 07/05/2020   ETH <10 29/52/8413   Metabolic Disorder Labs:  Lab Results  Component Value Date   HGBA1C 5.2 01/10/2017   No results found for: PROLACTIN Lab Results  Component Value Date   CHOL 109 07/09/2017   TRIG 138 07/09/2017   HDL 44 07/09/2017   CHOLHDL 2.5 07/09/2017   VLDL 28 07/09/2017   LDLCALC 37 07/09/2017   Current Medications: Current Facility-Administered  Medications  Medication Dose Route Frequency Provider Last Rate Last Admin  . alum & mag hydroxide-simeth (MAALOX/MYLANTA) 200-200-20 MG/5ML suspension 30 mL  30 mL Oral Q4H PRN Leevy-Johnson, Brooke A, NP      . cloNIDine (CATAPRES) tablet 0.1 mg  0.1 mg Oral QID Sharma Covert, MD   0.1 mg at 10/23/20 2440   Followed by  . [START ON 10/25/2020] cloNIDine (CATAPRES) tablet 0.1 mg  0.1 mg Oral BH-qamhs Clary, Cordie Grice, MD       Followed by  . [START ON 10/27/2020] cloNIDine (CATAPRES) tablet 0.1 mg  0.1 mg Oral QAC breakfast Sharma Covert, MD      .  dicyclomine (BENTYL) tablet 20 mg  20 mg Oral Q6H PRN Sharma Covert, MD      . folic acid (FOLVITE) tablet 1 mg  1 mg Oral Daily Sharma Covert, MD   1 mg at 10/23/20 2951  . hydrOXYzine (ATARAX/VISTARIL) tablet 25 mg  25 mg Oral Q6H PRN Sharma Covert, MD      . loperamide (IMODIUM) capsule 2-4 mg  2-4 mg Oral PRN Sharma Covert, MD      . LORazepam (ATIVAN) tablet 1 mg  1 mg Oral Q6H PRN Sharma Covert, MD      . OLANZapine zydis (ZYPREXA) disintegrating tablet 5 mg  5 mg Oral Q8H PRN Sharma Covert, MD       And  . LORazepam (ATIVAN) tablet 1 mg  1 mg Oral PRN Sharma Covert, MD       And  . ziprasidone (GEODON) injection 20 mg  20 mg Intramuscular PRN Sharma Covert, MD      . methocarbamol (ROBAXIN) tablet 500 mg  500 mg Oral Q8H PRN Sharma Covert, MD   500 mg at 10/23/20 0940  . mirtazapine (REMERON) tablet 15 mg  15 mg Oral QHS Sharma Covert, MD      . naproxen (NAPROSYN) tablet 500 mg  500 mg Oral BID PRN Sharma Covert, MD      . ondansetron (ZOFRAN-ODT) disintegrating tablet 4 mg  4 mg Oral Q6H PRN Sharma Covert, MD      . QUEtiapine (SEROQUEL) tablet 400 mg  400 mg Oral QHS Sharma Covert, MD      . thiamine tablet 100 mg  100 mg Oral Daily Sharma Covert, MD   100 mg at 10/23/20 8841  . traZODone (DESYREL) tablet 50 mg  50 mg Oral QHS PRN Caroline Sauger, NP   50  mg at 10/22/20 2352   PTA Medications: Medications Prior to Admission  Medication Sig Dispense Refill Last Dose  . busPIRone (BUSPAR) 15 MG tablet Take 1 tablet (15 mg total) by mouth 3 (three) times daily. (Patient not taking: Reported on 10/22/2020) 90 tablet 0 Not Taking at Unknown time  . FLUoxetine (PROZAC) 20 MG capsule Take 1 capsule (20 mg total) by mouth daily. (Patient not taking: Reported on 10/22/2020) 30 capsule 0 Not Taking at Unknown time  . hydrOXYzine (ATARAX/VISTARIL) 25 MG tablet Take 1 tablet (25 mg total) by mouth every 6 (six) hours as needed for anxiety. (Patient not taking: Reported on 10/22/2020) 30 tablet 0 Not Taking at Unknown time  . OXcarbazepine (TRILEPTAL) 150 MG tablet Take 3 tablets (450 mg total) by mouth 2 (two) times daily. (Patient not taking: Reported on 10/22/2020) 180 tablet 0 Not Taking at Unknown time  . QUEtiapine (SEROQUEL) 300 MG tablet Take 600 mg by mouth at bedtime. (Patient not taking: Reported on 10/22/2020)   Not Taking at Unknown time  . topiramate (TOPAMAX) 25 MG tablet Take 1 tablet (25 mg total) by mouth 2 (two) times daily. (Patient not taking: Reported on 10/22/2020) 60 tablet 0 Not Taking at Unknown time  . traZODone (DESYREL) 50 MG tablet Take 1 tablet (50 mg total) by mouth at bedtime as needed for sleep. (Patient not taking: Reported on 10/22/2020) 30 tablet 0 Not Taking at Unknown time   Musculoskeletal: Strength & Muscle Tone: within normal limits Gait & Station: normal Patient leans: N/A  Psychiatric Specialty Exam: Physical Exam Vitals and nursing note reviewed.  HENT:  Head: Normocephalic and atraumatic.     Nose: Nose normal.     Mouth/Throat:     Pharynx: Oropharynx is clear.  Eyes:     Pupils: Pupils are equal, round, and reactive to light.  Cardiovascular:     Rate and Rhythm: Normal rate.     Pulses: Normal pulses.  Pulmonary:     Effort: Pulmonary effort is normal.  Genitourinary:    Comments:  Deferred Musculoskeletal:        General: Normal range of motion.     Cervical back: Normal range of motion.  Skin:    General: Skin is warm and dry.  Neurological:     General: No focal deficit present.     Mental Status: She is alert.     Review of Systems  Constitutional: Negative for chills, diaphoresis and fever.  HENT: Negative for congestion, rhinorrhea, sneezing and sore throat.   Eyes: Negative for discharge.  Respiratory: Negative for cough, shortness of breath and wheezing.   Cardiovascular: Negative for chest pain and palpitations.  Gastrointestinal: Negative for diarrhea, nausea and vomiting.  Endocrine: Negative for cold intolerance.  Genitourinary: Negative for difficulty urinating.  Musculoskeletal: Negative for arthralgias and myalgias.  Skin: Negative.   Allergic/Immunologic: Negative for environmental allergies and food allergies.       Allergies: NKDA  Neurological: Negative for dizziness, tremors, seizures, syncope, facial asymmetry, speech difficulty, weakness, light-headedness, numbness and headaches.  Psychiatric/Behavioral: Positive for agitation, behavioral problems (Patient uncooperative, refusing to talk to the providers.) and dysphoric mood. Negative for confusion, decreased concentration, hallucinations, self-injury, sleep disturbance and suicidal ideas. The patient is nervous/anxious. The patient is not hyperactive.   All other systems reviewed and are negative.   Blood pressure 105/70, pulse 87, temperature 98.3 F (36.8 C), temperature source Oral, resp. rate 18, height 5' 10"  (1.778 m), weight 84.4 kg, SpO2 100 %, unknown if currently breastfeeding.Body mass index is 26.69 kg/m.  General Appearance: Casual  Eye Contact:  Minimal  Speech:  Normal Rate  Volume:  Normal  Mood:  Anxious and Irritable  Affect:  Congruent  Thought Process:  Coherent and Descriptions of Associations: Circumstantial  Orientation:  Full (Time, Place, and Person)   Thought Content:  Logical and Rumination  Suicidal Thoughts:  Denies  Homicidal Thoughts:  No  Memory:  Immediate;   Poor Recent;   Poor Remote;   Poor  Judgement:  Impaired  Insight:  Lacking  Psychomotor Activity:  Normal  Concentration:  Poor  Recall:  Poor  Fund of Knowledge:  Fair  Language:  Fair  Akathisia:  Negative  Handed:  Right  AIMS (if indicated):     Assets:  Desire for Improvement Resilience  ADL's:  Intact  Cognition:  WNL  Sleep:  Number of Hours: 5.25   Treatment Plan Summary: Daily contact with patient to assess and evaluate symptoms and progress in treatment and Medication management.  Treatment Plan/Recommendations: 1. Admit for crisis management and stabilization, estimated length of stay 3-5 days.  2. Medication management to reduce current symptoms to base line and improve the patient's overall level of functioning: See [facility] for plan of care. 3. Treat health problems as indicated.  4. Develop treatment plan to decrease risk of relapse upon discharge and the need for readmission.  5. Psycho-social education regarding relapse prevention and self care.  6. Health care follow up as needed for medical problems.  7. Review, reconcile, and reinstate any pertinent home medications for other health issues where appropriate.  8. Call for consults with hospitalist for any additional specialty patient care services as needed.  Observation Level/Precautions:  15 minute checks  Laboratory:  Per ED  Psychotherapy: Group sessions    Medications: See [facility]  Consultations: As needed   Discharge Concerns: Safety, mood stability. Maintaining sobriety  Estimated LOS: 2-4 days  Other: Admit to the 400-hall.    Physician Treatment Plan for Primary Diagnosis: Bipolar I disorder, most recent episode mixed ([place]) Long Term Goal(s): Improvement in symptoms so as ready for discharge  Short Term Goals: Ability to identify changes in lifestyle to reduce recurrence of condition  will improve, Ability to verbalize feelings will improve and Ability to disclose and discuss suicidal ideas  Physician Treatment Plan for Secondary Diagnosis: Principal Problem:   Bipolar I disorder, most recent episode mixed ([place]) Active Problems:   MDD (major depressive disorder)  Long Term Goal(s): Improvement in symptoms so as ready for discharge  Short Term Goals: Ability to identify and develop effective coping behaviors will improve, Compliance with prescribed medications will improve and Ability to identify triggers associated with substance abuse/mental health issues will improve  I certify that inpatient services furnished can reasonably be expected to improve the patient's condition.    Lindell Spar, NP, PMHNP, FNP-BC 1/29/20221:21 PM

## 2020-10-23 NOTE — Progress Notes (Signed)
Writer was sitting at the NS and was watching the hall from the camera when they observed a female pt go into pt room. Writer immediately went to the pt room and told the female pt to leave their room. Writer had a private discussion with pt and told them they cannot go into any pt room or allow another pt besides their roommate in their room whether female or female. Writer explained the risks of going into another pt room and some consequences that could result. Pt replied "I didn't go into anybody's room" and writer reiterated their initial statement. When asked if pt understood pt nodded to Probation officer and appeared to Armed forces operational officer. Througout the night writer has witness pt and the same female pt fraternizing in the dayroom and speaking sexual towards each other. Writer overheard female pt say to pt "I'm ready to go half on a baby", referring to making a baby.

## 2020-10-23 NOTE — Progress Notes (Signed)
[facility] Group Notes:  (Nursing/MHT/Case Management/Adjunct)  Date:  10/23/2020  Time:  2000  Type of Therapy:  wrap up group  Participation Level:  Active  Participation Quality:  Appropriate, Attentive, Sharing and Supportive  Affect:  Appropriate  Cognitive:  Alert  Insight:  Improving  Engagement in Group:  Engaged  Modes of Intervention:  Clarification, Education and Support  Summary of Progress/Problems: Positive thinking and self-care were discussed.   Latoya [name] 10/23/2020, 10:27 PM

## 2020-10-24 NOTE — Progress Notes (Signed)
   10/24/20 2352  Psych Admission Type (Psych Patients Only)  Admission Status Voluntary  Psychosocial Assessment  Patient Complaints Irritability  Eye Contact Brief  Facial Expression Fixed smile  Affect Irritable  Speech Logical/coherent  Interaction Minimal  Motor Activity Other (Comment) (WDL)  Appearance/Hygiene Unremarkable  Behavior Characteristics Guarded;Irritable  Mood Labile  Thought Process  Coherency WDL  Content WDL  Delusions None reported or observed  Perception WDL  Hallucination None reported or observed  Judgment Poor  Confusion None  Danger to Self  Current suicidal ideation? Denies  Self-Injurious Behavior No self-injurious ideation or behavior indicators observed or expressed   Agreement Not to Harm Self Yes  Description of Agreement verbal contract  Danger to Others  Danger to Others None reported or observed

## 2020-10-24 NOTE — Progress Notes (Signed)
The patient ignored Probation officer and refused vital signs and medication this morning. Writer attempted twice to assess the pt's vitals and administer medications. Pt refused to complete assessment.

## 2020-10-24 NOTE — [facility] Group Notes (Signed)
.  Psychoeducational Group Note  Date: 09-26-27-2222 Time: 0900-1000    Goal Setting   Purpose of Group: This group helps to provide patients with the steps of setting a goal that is specific, measurable, attainable, realistic and time specific. A discussion on how we keep ourselves stuck with negative self talk.    Participation Level:  Active  Participation Quality:  Appropriate  Affect:  Appropriate  Cognitive:  Appropriate  Insight:  Improving  Engagement in Group:  Engaged  Additional Comments: Rates her energy at a 6/10. Given support from the group.  Paulino Rily

## 2020-10-24 NOTE — Progress Notes (Signed)
[facility] MD Progress Note  10/24/2020 10:44 AM Latoya [name]  MRN:  [id number]  Subjective: Latoya [name] reports, "I don't wanna talk".  Objective: Patient is a 30 year old female with a past psychiatric history significant for polysubstance dependence who presented to the behavioral health hospital as a walk-in on 10/22/2020. The patient had recently been released from jail on 10/20/2020. She immediately went to start using drugs again. She claimed to have used cocaine and opioids. She stated that she was suicidal. The assessing team at that time decided for admission. I am familiar with the patient. The patient had been admitted to the psychiatric hospital in October 2021. She was suicidal at that time. At that time she admitted that she had recently been discharged from the [facility] for substance abuse treatment in [place], [place]. She also was to going to a sober living facility in [facility], but had relapsed and was not accepted into that facility. Daily notes: Latoya [name] is lying down in bed. She says she does not want to talk. She would not make any eye contact. She would not respond to the assessment questions. The nursing staff reports that Latoya [name] is also  uncooperative with them during care, ignoring them, refusing medications & her vital signs to be taken. Reports indicated that Latoya [name] was inappropriate with another female patient last night. She has been counseled by staff & informed that such behaviors are not allowed or tolerated here at [facility].    Principal Problem: Bipolar I disorder, most recent episode mixed ([place])  Diagnosis: Principal Problem:   Bipolar I disorder, most recent episode mixed ([place]) Active Problems:   MDD (major depressive disorder)  Total Time spent with patient: 25 minutes  Past Psychiatric History: See H&P  Past Medical History:  Past Medical History:  Diagnosis Date  . Anemia   . Anxiety   . Arthritis   . Blood transfusion without  reported diagnosis   . Crohn's disease ([place])   . Depression   . Drug-seeking behavior   . Gestational diabetes   . History of substance abuse ([place])   . HSV infection   . Pregnancy induced hypertension   . Substance abuse [facility])     Past Surgical History:  Procedure Laterality Date  . CHOLECYSTECTOMY    . COLONOSCOPY N/A 07/07/2017   Procedure: COLONOSCOPY;  Surgeon: Juanita Craver, MD;  Location: [facility] ENDOSCOPY;  Service: Endoscopy;  Laterality: N/A;  . DEEP NECK LYMPH NODE BIOPSY / EXCISION  2011   Family History:  Family History  Problem Relation Age of Onset  . Diabetes Mother   . Hypertension Mother   . Hypertension Father   . Stroke Maternal Grandfather   . Colon cancer Neg Hx   . Stomach cancer Neg Hx    Family Psychiatric  History: H&P  Social History:  Social History   Substance and Sexual Activity  Alcohol Use Not Currently     Social History   Substance and Sexual Activity  Drug Use Yes  . Types: "Crack" cocaine, Cocaine   Comment: cocai, former user, 06/27/2017    Social History   Socioeconomic History  . Marital status: Single    Spouse name: Not on file  . Number of children: 2  . Years of education: Not on file  . Highest education level: Not on file  Occupational History  . Not on file  Tobacco Use  . Smoking status: Never Smoker  . Smokeless tobacco: Never Used  Vaping Use  . Vaping  Use: Never used  Substance and Sexual Activity  . Alcohol use: Not Currently  . Drug use: Yes    Types: "Crack" cocaine, Cocaine    Comment: cocai, former user, 06/27/2017  . Sexual activity: Not Currently    Partners: Male    Birth control/protection: None  Other Topics Concern  . Not on file  Social History Narrative  . Not on file   Social Determinants of Health   Financial Resource Strain: Not on file  Food Insecurity: Not on file  Transportation Needs: Not on file  Physical Activity: Not on file  Stress: Not on file  Social Connections: Not on file    Additional Social History:  Pain Medications: See MAR Prescriptions: See MAR- has not had meds in over a month. did not get meds in jail bc did not want to tell jail she took mental health meds Over the Counter: See MAR History of alcohol / drug use?: Yes Negative Consequences of Use: Personal relationships,Legal,Financial,Work / School Name of Substance 1: crack cocaine 1 - Age of First Use: 23 1 - Last Use / Amount: 10/20/20 day she was released from jail Name of Substance 2: opioids 2 - Age of First Use: 30 yrs old 2 - Last Use / Amount: August 2021  Sleep: Good  Appetite:  Good  Current Medications: Current Facility-Administered Medications  Medication Dose Route Frequency Provider Last Rate Last Admin  . alum & mag hydroxide-simeth (MAALOX/MYLANTA) 200-200-20 MG/5ML suspension 30 mL  30 mL Oral Q4H PRN Leevy-Johnson, Brooke A, NP      . cloNIDine (CATAPRES) tablet 0.1 mg  0.1 mg Oral QID Sharma Covert, MD   0.1 mg at 10/23/20 2153   Followed by  . [START ON 10/25/2020] cloNIDine (CATAPRES) tablet 0.1 mg  0.1 mg Oral BH-qamhs Clary, Cordie Grice, MD       Followed by  . [START ON 10/27/2020] cloNIDine (CATAPRES) tablet 0.1 mg  0.1 mg Oral QAC breakfast Sharma Covert, MD      . dicyclomine (BENTYL) tablet 20 mg  20 mg Oral Q6H PRN Sharma Covert, MD      . folic acid (FOLVITE) tablet 1 mg  1 mg Oral Daily Sharma Covert, MD   1 mg at 10/23/20 3570  . hydrOXYzine (ATARAX/VISTARIL) tablet 25 mg  25 mg Oral Q6H PRN Sharma Covert, MD   25 mg at 10/23/20 2155  . loperamide (IMODIUM) capsule 2-4 mg  2-4 mg Oral PRN Sharma Covert, MD      . LORazepam (ATIVAN) tablet 1 mg  1 mg Oral Q6H PRN Sharma Covert, MD      . OLANZapine zydis (ZYPREXA) disintegrating tablet 5 mg  5 mg Oral Q8H PRN Sharma Covert, MD       And  . LORazepam (ATIVAN) tablet 1 mg  1 mg Oral PRN Sharma Covert, MD       And  . ziprasidone (GEODON) injection 20 mg  20 mg  Intramuscular PRN Sharma Covert, MD      . methocarbamol (ROBAXIN) tablet 500 mg  500 mg Oral Q8H PRN Sharma Covert, MD   500 mg at 10/23/20 2154  . mirtazapine (REMERON) tablet 15 mg  15 mg Oral QHS Sharma Covert, MD   15 mg at 10/23/20 2154  . naproxen (NAPROSYN) tablet 500 mg  500 mg Oral BID PRN Sharma Covert, MD      . ondansetron (ZOFRAN-ODT) disintegrating  tablet 4 mg  4 mg Oral Q6H PRN Sharma Covert, MD      . QUEtiapine (SEROQUEL) tablet 400 mg  400 mg Oral QHS Sharma Covert, MD   400 mg at 10/23/20 2154  . thiamine tablet 100 mg  100 mg Oral Daily Sharma Covert, MD   100 mg at 10/23/20 8676  . traZODone (DESYREL) tablet 50 mg  50 mg Oral QHS PRN Caroline Sauger, NP   50 mg at 10/23/20 2154   Lab Results:  Results for orders placed or performed during the hospital encounter of 10/22/20 (from the past 48 hour(s))  SARS Coronavirus 2 by RT PCR (hospital order, performed in [place] hospital lab)     Status: None   Collection Time: 10/22/20 11:20 AM  Result Value Ref Range   SARS Coronavirus 2 NEGATIVE NEGATIVE    Comment: (NOTE) SARS-CoV-2 target nucleic acids are NOT DETECTED.  The SARS-CoV-2 RNA is generally detectable in upper and lower respiratory specimens during the acute phase of infection. The lowest concentration of SARS-CoV-2 viral copies this assay can detect is 250 copies / mL. A negative result does not preclude SARS-CoV-2 infection and should not be used as the sole basis for treatment or other patient management decisions.  A negative result may occur with improper specimen collection / handling, submission of specimen other than nasopharyngeal swab, presence of viral mutation(s) within the areas targeted by this assay, and inadequate number of viral copies (<250 copies / mL). A negative result must be combined with clinical observations, patient history, and epidemiological information.  Fact Sheet for Patients:    StrictlyIdeas.no  Fact Sheet for Healthcare Providers: BankingDealers.co.za  This test is not yet approved or  cleared by the [place] FDA and has been authorized for detection and/or diagnosis of SARS-CoV-2 by FDA under an Emergency Use Authorization (EUA).  This EUA will remain in effect (meaning this test can be used) for the duration of the COVID-19 declaration under Section 564(b)(1) of the Act, 21 U.S.C. section 360bbb-3(b)(1), unless the authorization is terminated or revoked sooner.  Performed at [facility], [place] [address], [place], [place] [address]    Blood Alcohol level:  Lab Results  Component Value Date   [facility] <10 07/05/2020   ETH <10 32/67/1245   Metabolic Disorder Labs: Lab Results  Component Value Date   HGBA1C 5.2 01/10/2017   No results found for: PROLACTIN Lab Results  Component Value Date   CHOL 109 07/09/2017   TRIG 138 07/09/2017   HDL 44 07/09/2017   CHOLHDL 2.5 07/09/2017   VLDL 28 07/09/2017   LDLCALC 37 07/09/2017   Physical Findings: AIMS: Facial and Oral Movements Muscles of Facial Expression: None, normal Lips and Perioral Area: None, normal Jaw: None, normal Tongue: None, normal,Extremity Movements Upper (arms, wrists, hands, fingers): None, normal Lower (legs, knees, ankles, toes): None, normal, Trunk Movements Neck, shoulders, hips: None, normal, Overall Severity Severity of abnormal movements (highest score from questions above): None, normal Incapacitation due to abnormal movements: None, normal Patient's awareness of abnormal movements (rate only patient's report): No Awareness, Dental Status Current problems with teeth and/or dentures?: No Does patient usually wear dentures?: No  CIWA:    COWS:  COWS Total Score: 3  Musculoskeletal: Strength & Muscle Tone: within normal limits Gait & Station: normal Patient leans: N/A  Psychiatric Specialty Exam: Physical  Exam Vitals and nursing note reviewed.  HENT:     Head: Normocephalic.  Nose: Nose normal.     Mouth/Throat:     Pharynx: Oropharynx is clear.  Eyes:     Pupils: Pupils are equal, round, and reactive to light.  Cardiovascular:     Rate and Rhythm: Normal rate.     Pulses: Normal pulses.  Pulmonary:     Effort: Pulmonary effort is normal.  Genitourinary:    Comments: Deferred Musculoskeletal:        General: Normal range of motion.     Cervical back: Normal range of motion.  Skin:    General: Skin is warm and dry.  Neurological:     General: No focal deficit present.     Mental Status: She is alert and oriented to person, place, and time.     Review of Systems  Constitutional: Negative for chills, diaphoresis and fever.  HENT: Negative for congestion, sneezing and sore throat.   Eyes: Negative for discharge.  Respiratory: Negative for cough, shortness of breath and wheezing.   Cardiovascular: Negative for chest pain and palpitations.  Gastrointestinal: Negative for diarrhea, nausea and vomiting.  Endocrine: Negative for cold intolerance.  Genitourinary: Negative for difficulty urinating.  Musculoskeletal: Negative for arthralgias and myalgias.  Skin: Negative.   Allergic/Immunologic: Negative for environmental allergies and food allergies.       Allergies: NKDA  Neurological: Negative for dizziness, tremors, seizures, syncope, facial asymmetry, speech difficulty, weakness, light-headedness, numbness and headaches.  Psychiatric/Behavioral: Positive for behavioral problems (Uncooperative with staff, refusing to answer questions, does not heed to instructions.) and dysphoric mood. Negative for agitation, confusion, decreased concentration, hallucinations, self-injury, sleep disturbance and suicidal ideas. The patient is not nervous/anxious and is not hyperactive.     Blood pressure 104/63, pulse 79, temperature 98.3 F (36.8 C), temperature source Oral, resp. rate 18, height  5' 10"  (1.778 m), weight 84.4 kg, SpO2 100 %, unknown if currently breastfeeding.Body mass index is 26.69 kg/m.  General Appearance: Casual  Eye Contact:  Minimal  Speech:  Normal Rate  Volume:  Normal  Mood:  Anxious and Irritable  Affect:  Congruent  Thought Process:  Coherent and Descriptions of Associations: Circumstantial  Orientation:  Full (Time, Place, and Person)  Thought Content:  Logical and Rumination  Suicidal Thoughts:  Denies  Homicidal Thoughts:  No  Memory:  Immediate;   Poor Recent;   Poor Remote;   Poor  Judgement:  Impaired  Insight:  Lacking  Psychomotor Activity:  Normal  Concentration:  Poor  Recall:  Poor  Fund of Knowledge:  Fair  Language:  Fair  Akathisia:  Negative  Handed:  Right  AIMS (if indicated):     Assets:  Desire for Improvement Resilience  ADL's:  Intact  Cognition:  WNL    Sleep:  Number of Hours: 6   Treatment Plan Summary: Daily contact with patient to assess and evaluate symptoms and progress in treatment and Medication management.   Continue inpatient hospitalization. Will continue today 10/24/2020 plan as below except where it is noted.  Opioid withdrawal management. Continue the clonidine detox protocols.  Anxiety/CIWA>10.. Continue Ativan 1 mg po Q 6 hrs prn.  Depression/insomnia. Continue Mirtazapine 15 mg po Q hs.  Agitation/psychosis protocols. Continue Olanzapine 5 mg Q 8 hrs prn. & Lorazepam 1 mg po prn. & Ziprasidone 20 mg IM prn.  Encourage group participation. Discharge disposition in progress.  Lindell Spar, NP, PMHNP, FNP-BC 10/24/2020, 10:44 AM

## 2020-10-24 NOTE — Progress Notes (Signed)
Patient verbally aggressive while in the hallway at the nurses station. She called Probation officer a [employer] manager several times after the door to the dayroom was closed as the pts on the unit continued to block the doorway to the dayroom and the nurses station.  Writer explained the unit policies to the patients and informed the pts that a staff member have to be in the dayroom with the pts at all times and that the pts are not allowed to have the remote to the t.v. The patient had previously taken the remote off the nurses station counter and was asked to return it. She continued to be argumentative and verbally aggressive.

## 2020-10-24 NOTE — Progress Notes (Signed)
   10/24/20 2349  COVID-19 Daily Checkoff  Have you had a fever (temp > 37.80C/100F)  in the past 24 hours?  No  If you have had runny nose, nasal congestion, sneezing in the past 24 hours, has it worsened? No  COVID-19 EXPOSURE  Have you traveled outside the state in the past 14 days? No  Have you been in contact with someone with a confirmed diagnosis of COVID-19 or PUI in the past 14 days without wearing appropriate PPE? No  Have you been living in the same home as a person with confirmed diagnosis of COVID-19 or a PUI (household contact)? No  Have you been diagnosed with COVID-19? No

## 2020-10-24 NOTE — BHH Group Notes (Signed)
Adult Psychoeducational Group Not Date:  10/24/2020 Time:  0900-1045 Group Topic/Focus: PROGRESSIVE RELAXATION. A group where deep breathing is taught and tensing and relaxation muscle groups is used. Imagery is used as well.  Pts are asked to imagine 3 pillars that hold them up when they are not able to hold themselves up.  Participation Level:  Did not attend   Ewald Beg A 10/17/2020   

## 2020-10-24 NOTE — Progress Notes (Signed)
Psychoeducational Group Note  Date:  10/24/2020 Time: 2000 Group Topic/Focus:  wrap up group  Participation Level: Did Not Attend  Participation Quality:  Not Applicable  Affect:  Not Applicable  Cognitive:  Not Applicable  Insight:  Not Applicable  Engagement in Group: Not Applicable  Additional Comments: Positive thinking and positive change were discussed.  Shellia Cleverly 10/24/2020, 9:46 PM

## 2020-10-24 NOTE — Progress Notes (Signed)
The patient ignored Probation officer and refused vital signs and medication this morning. Writer attempted twice to assess the pt's vitals and administer medications.

## 2020-10-24 NOTE — Progress Notes (Signed)
Patient verbally aggressive while at the nurses station Chiropractor a Bitch because the dayroom door was closed. Writer explained the policy to the patient and the pt continued to be argumentative and threatening.

## 2020-10-24 NOTE — BHH Group Notes (Signed)
[place] LCSW Group Therapy Note  10/24/2020  10:15-11:15am  Type of Therapy and Topic:  Group Therapy:  Adding Supports Including Yourself  Participation Level:  Did Not Attend   Description of Group:   Patients in this group were introduced to the concept that additional supports including self-support are an essential part of recovery.  Patients listed what supports they believe they need to add to their lives to achieve their goals at discharge, and they listed such things as therapist, family, doctor, support groups, 12-step groups and service animals.   A song entitled "My Own Hero" was played and a group discussion ensued in which patients stated they could relate to the song and it inspired them to realize they have to be willing to help themselves in order to succeed, because other people cannot achieve sobriety or stability for them.  Additional song ("I Am Enough") was played to encourage patients toward self-advocacy and self-support as part of their recovery.  They discussed their reactions to these songs' messages, which were positive and hopeful.  Therapeutic Goals: 1)  demonstrate the importance of being a key part of one's own support system 2)  discuss various available supports 3)  encourage patient to use music as part of their self-support and focus on goals 4)  elicit ideas from patients about supports that need to be added   Summary of Patient Progress:  The patient was invited to group but chose not to attend.  Therapeutic Modalities:   Education Activity Processing  Maretta Los

## 2020-10-25 DIAGNOSIS — F3160 Bipolar disorder, current episode mixed, unspecified: Principal | ICD-10-CM

## 2020-10-25 MED ORDER — MIRTAZAPINE 15 MG PO TABS: 15.0000 mg | ORAL_TABLET | Freq: Every day | ORAL | 0 refills | Status: AC

## 2020-10-25 MED ORDER — HYDROXYZINE HCL 25 MG PO TABS: 25.0000 mg | ORAL_TABLET | Freq: Four times a day (QID) | ORAL | 0 refills | Status: AC | PRN

## 2020-10-25 MED ORDER — QUETIAPINE FUMARATE 400 MG PO TABS: 400.0000 mg | ORAL_TABLET | Freq: Every day | ORAL | 0 refills | Status: AC

## 2020-10-25 NOTE — Progress Notes (Signed)
Discharge Note:  Patient discharged via transport to [facility] residential facility.  Patient denied SI and HI.  Denied A/V hallucinations.  Denied pain.  Suicide prevention information given and discussed with patient who stated she understood and had no questions.  Patient stated she received all her belongings, clothing, toiletries, misc items, etc.  Patient stated she appreciated all assistance received from [facility] staff.

## 2020-10-25 NOTE — BHH Suicide Risk Assessment (Signed)
[place] INPATIENT:  Family/Significant Other Suicide Prevention Education  Suicide Prevention Education:  Education Completed; Nyeli Holtmeyer [phone] (Mother) has been identified by the patient as the family member/significant other with whom the patient will be residing, and identified as the person(s) who will aid the patient in the event of a mental health crisis (suicidal ideations/suicide attempt).  With written consent from the patient, the family member/significant other has been provided the following suicide prevention education, prior to the and/or following the discharge of the patient.  The suicide prevention education provided includes the following:  Suicide risk factors  Suicide prevention and interventions  National Suicide Hotline telephone number  [facility] assessment telephone number  [facility] Emergency Assistance [place] and/or Residential Mobile Crisis Unit telephone number  Request made of family/significant other to:  Remove weapons (e.g., guns, rifles, knives), all items previously/currently identified as safety concern.    Remove drugs/medications (over-the-counter, prescriptions, illicit drugs), all items previously/currently identified as a safety concern.  The family member/significant other verbalizes understanding of the suicide prevention education information provided.  The family member/significant other agrees to remove the items of safety concern listed above.  CSW spoke with Mrs. Mandigo who states that her daughter was staying with her grandmother after she was released from jail on 10/20/2020 due to trespassing and a failure to appear.  Mrs. Candella states that she has also had her daughter arrested for trespassing on 3 separate occassions.  Mrs. Lauver states this behavior have been going on for a long time and "I can't help her anymore".  Mrs. Arquette has her daughter's children at this time.  Mrs. Petrovich would like her  daughter to have an Agricultural consultant and hopes [facility] can help with this.  Mrs. Yingst states that her daughter is currently homeless as well.  Mrs. Lamb stated she has no concerns or questions at this time.  CSW completed SPE with Mrs. Izzo.   Frutoso Chase Tylor Gambrill 10/25/2020, 12:22 PM

## 2020-10-25 NOTE — Tx Team (Signed)
Interdisciplinary Treatment and Diagnostic Plan Update  10/25/2020 Time of Session: 9:05am Latoya [name] MRN: [id number]  Principal Diagnosis: Bipolar I disorder, most recent episode mixed [facility])  Secondary Diagnoses: Principal Problem:   Bipolar I disorder, most recent episode mixed (Latoya [name]) Active Problems:   MDD (major depressive disorder)   Current Medications:  Current Facility-Administered Medications  Medication Dose Route Frequency Provider Last Rate Last Admin  . alum & mag hydroxide-simeth (MAALOX/MYLANTA) 200-200-20 MG/5ML suspension 30 mL  30 mL Oral Q4H PRN Leevy-Johnson, Brooke A, NP      . cloNIDine (CATAPRES) tablet 0.1 mg  0.1 mg Oral BH-qamhs Clary, Cordie Grice, MD       Followed by  . [START ON 10/27/2020] cloNIDine (CATAPRES) tablet 0.1 mg  0.1 mg Oral QAC breakfast Sharma Covert, MD      . dicyclomine (BENTYL) tablet 20 mg  20 mg Oral Q6H PRN Sharma Covert, MD      . folic acid (FOLVITE) tablet 1 mg  1 mg Oral Daily Sharma Covert, MD   1 mg at 10/23/20 6073  . hydrOXYzine (ATARAX/VISTARIL) tablet 25 mg  25 mg Oral Q6H PRN Sharma Covert, MD   25 mg at 10/24/20 2154  . loperamide (IMODIUM) capsule 2-4 mg  2-4 mg Oral PRN Sharma Covert, MD      . LORazepam (ATIVAN) tablet 1 mg  1 mg Oral Q6H PRN Sharma Covert, MD      . OLANZapine zydis (ZYPREXA) disintegrating tablet 5 mg  5 mg Oral Q8H PRN Sharma Covert, MD       And  . LORazepam (ATIVAN) tablet 1 mg  1 mg Oral PRN Sharma Covert, MD       And  . ziprasidone (GEODON) injection 20 mg  20 mg Intramuscular PRN Sharma Covert, MD      . methocarbamol (ROBAXIN) tablet 500 mg  500 mg Oral Q8H PRN Sharma Covert, MD   500 mg at 10/23/20 2154  . mirtazapine (REMERON) tablet 15 mg  15 mg Oral QHS Sharma Covert, MD   15 mg at 10/24/20 2155  . naproxen (NAPROSYN) tablet 500 mg  500 mg Oral BID PRN Sharma Covert, MD   500 mg at 10/24/20 1455  . ondansetron (ZOFRAN-ODT)  disintegrating tablet 4 mg  4 mg Oral Q6H PRN Sharma Covert, MD      . QUEtiapine (SEROQUEL) tablet 400 mg  400 mg Oral QHS Sharma Covert, MD   400 mg at 10/24/20 2155  . thiamine tablet 100 mg  100 mg Oral Daily Sharma Covert, MD   100 mg at 10/23/20 7106  . traZODone (DESYREL) tablet 50 mg  50 mg Oral QHS PRN Caroline Sauger, NP   50 mg at 10/24/20 2154   PTA Medications: Medications Prior to Admission  Medication Sig Dispense Refill Last Dose  . busPIRone (BUSPAR) 15 MG tablet Take 1 tablet (15 mg total) by mouth 3 (three) times daily. (Patient not taking: Reported on 10/22/2020) 90 tablet 0 Not Taking at Unknown time  . FLUoxetine (PROZAC) 20 MG capsule Take 1 capsule (20 mg total) by mouth daily. (Patient not taking: Reported on 10/22/2020) 30 capsule 0 Not Taking at Unknown time  . OXcarbazepine (TRILEPTAL) 150 MG tablet Take 3 tablets (450 mg total) by mouth 2 (two) times daily. (Patient not taking: Reported on 10/22/2020) 180 tablet 0 Not Taking at Unknown time  . QUEtiapine (SEROQUEL) 300  MG tablet Take 600 mg by mouth at bedtime. (Patient not taking: Reported on 10/22/2020)   Not Taking at Unknown time  . topiramate (TOPAMAX) 25 MG tablet Take 1 tablet (25 mg total) by mouth 2 (two) times daily. (Patient not taking: Reported on 10/22/2020) 60 tablet 0 Not Taking at Unknown time  . traZODone (DESYREL) 50 MG tablet Take 1 tablet (50 mg total) by mouth at bedtime as needed for sleep. (Patient not taking: Reported on 10/22/2020) 30 tablet 0 Not Taking at Unknown time  . [DISCONTINUED] hydrOXYzine (ATARAX/VISTARIL) 25 MG tablet Take 1 tablet (25 mg total) by mouth every 6 (six) hours as needed for anxiety. (Patient not taking: Reported on 10/22/2020) 30 tablet 0 Not Taking at Unknown time    Patient Stressors: Financial difficulties Health problems Medication change or noncompliance Substance abuse  Patient Strengths: Ability for insight Average or above average  intelligence Capable of independent living Communication skills  Treatment Modalities: Medication Management, Group therapy, Case management,  1 to 1 session with clinician, Psychoeducation, Recreational therapy.   Physician Treatment Plan for Primary Diagnosis: Bipolar I disorder, most recent episode mixed ([place]) Long Term Goal(s): Improvement in symptoms so as ready for discharge Improvement in symptoms so as ready for discharge   Short Term Goals: Ability to identify changes in lifestyle to reduce recurrence of condition will improve Ability to verbalize feelings will improve Ability to disclose and discuss suicidal ideas Ability to identify and develop effective coping behaviors will improve Compliance with prescribed medications will improve Ability to identify triggers associated with substance abuse/mental health issues will improve  Medication Management: Evaluate patient's response, side effects, and tolerance of medication regimen.  Therapeutic Interventions: 1 to 1 sessions, Unit Group sessions and Medication administration.  Evaluation of Outcomes: Adequate for Discharge  Physician Treatment Plan for Secondary Diagnosis: Principal Problem:   Bipolar I disorder, most recent episode mixed ([place]) Active Problems:   MDD (major depressive disorder)  Long Term Goal(s): Improvement in symptoms so as ready for discharge Improvement in symptoms so as ready for discharge   Short Term Goals: Ability to identify changes in lifestyle to reduce recurrence of condition will improve Ability to verbalize feelings will improve Ability to disclose and discuss suicidal ideas Ability to identify and develop effective coping behaviors will improve Compliance with prescribed medications will improve Ability to identify triggers associated with substance abuse/mental health issues will improve     Medication Management: Evaluate patient's response, side effects, and tolerance of medication  regimen.  Therapeutic Interventions: 1 to 1 sessions, Unit Group sessions and Medication administration.  Evaluation of Outcomes: Adequate for Discharge   RN Treatment Plan for Primary Diagnosis: Bipolar I disorder, most recent episode mixed ([place]) Long Term Goal(s): Knowledge of disease and therapeutic regimen to maintain health will improve  Short Term Goals: Ability to remain free from injury will improve, Ability to verbalize frustration and anger appropriately will improve and Compliance with prescribed medications will improve  Medication Management: RN will administer medications as ordered by provider, will assess and evaluate patient's response and provide education to patient for prescribed medication. RN will report any adverse and/or side effects to prescribing provider.  Therapeutic Interventions: 1 on 1 counseling sessions, Psychoeducation, Medication administration, Evaluate responses to treatment, Monitor vital signs and CBGs as ordered, Perform/monitor CIWA, COWS, AIMS and Fall Risk screenings as ordered, Perform wound care treatments as ordered.  Evaluation of Outcomes: Adequate for Discharge   LCSW Treatment Plan for Primary Diagnosis: Bipolar I disorder,  most recent episode mixed [facility]) Long Term Goal(s): Safe transition to appropriate next level of care at discharge, Engage patient in therapeutic group addressing interpersonal concerns.  Short Term Goals: Engage patient in aftercare planning with referrals and resources, Increase social support, Identify triggers associated with mental health/substance abuse issues and Increase skills for wellness and recovery  Therapeutic Interventions: Assess for all discharge needs, 1 to 1 time with Social worker, Explore available resources and support systems, Assess for adequacy in community support network, Educate family and significant other(s) on suicide prevention, Complete Psychosocial Assessment, Interpersonal group  therapy.  Evaluation of Outcomes: Adequate for Discharge   Progress in Treatment: Attending groups: Yes. and No. Participating in groups: Yes. Taking medication as prescribed: Yes. Toleration medication: Yes. Family/Significant other contact made: No, will contact:  mother Patient understands diagnosis: Yes. Discussing patient identified problems/goals with staff: Yes. Medical problems stabilized or resolved: Yes. Denies suicidal/homicidal ideation: Yes. Issues/concerns per patient self-inventory: No.   New problem(s) identified: No, Describe:  none  New Short Term/Long Term Goal(s): detox, medication management for mood stabilization; elimination of SI thoughts; development of comprehensive mental wellness/sobriety plan  Patient Goals:  Did not attend  Discharge Plan or Barriers: Patient is to discharge to [facility] for continued substance use treatment.   Reason for Continuation of Hospitalization: Medication stabilization  Estimated Length of Stay: 1-3 days  Attendees: Patient: Latoya [name] 10/25/2020  Physician: Lala Lund, MD 10/25/2020   Nursing:  10/25/2020  RN Care Manager: 10/25/2020   Social Worker: Darletta Moll, LCSW 10/25/2020   Recreational Therapist:  10/25/2020   Other:  10/25/2020  Other:  10/25/2020   Other: 10/25/2020     Scribe for Treatment Team: Vassie Moselle, LCSW 10/25/2020 11:17 AM

## 2020-10-25 NOTE — BHH Counselor (Signed)
CSW spoke with Latoya [name] who states that she contacted [facility] and was told that they had a bed available for her.  CSW contacted [facility] that confirmed that they have a Detox bed available for Latoya [name].  CSW will complete discharge information for Latoya [name] who states that she would like to go to this facility for her Detox.  Latoya [name] states that she will also complete information at [facility] for a bed in their residential facility.

## 2020-10-25 NOTE — BHH Counselor (Signed)
CSW contacted [facility] [phone] who states that Latoya [name] was discharged after 5 days at there facility due to her behaviors.  CSW was informed that Latoya [name] is allowed to come back but the facility will not have an available bed until March 1st, 2022 and their waiting list is currently closed.  CSW provided Latoya [name] with this information and Latoya [name] simply grunted at [employer].  CSW will continue to attempt to speak with the patient about her discharge planning.

## 2020-10-25 NOTE — Discharge Summary (Signed)
Physician Discharge Summary Note  Patient:  Latoya [name] is an 30 y.o., female MRN:  [id number] DOB:  August 12, 1991 Patient phone:  [phone] (home)  Patient address:   [address] [place] [address],  Total Time spent with patient: 30 minutes  Date of Admission:  10/22/2020 Date of Discharge: 10/25/2020  Reason for Admission:  Suicidal ideation with a plan to overdose  Principal Problem: Bipolar I disorder, most recent episode mixed [facility]) Discharge Diagnoses: Principal Problem:   Bipolar I disorder, most recent episode mixed ([place]) Active Problems:   MDD (major depressive disorder)   Past Psychiatric History: Patient has apparently been in multiple emergency rooms and been in rehabilitation facilities in the past.  She has been to the the 4-week program at [facility].  She has been on psychiatric medications in the past including Seroquel, Paxil, Rexulti, BuSpar, fluoxetine, hydroxyzine, quetiapine, topiramate.  Past Medical History:  Past Medical History:  Diagnosis Date  . Anemia   . Anxiety   . Arthritis   . Blood transfusion without reported diagnosis   . Crohn's disease ([place])   . Depression   . Drug-seeking behavior   . Gestational diabetes   . History of substance abuse ([place])   . HSV infection   . Pregnancy induced hypertension   . Substance abuse [facility])     Past Surgical History:  Procedure Laterality Date  . CHOLECYSTECTOMY    . COLONOSCOPY N/A 07/07/2017   Procedure: COLONOSCOPY;  Surgeon: Juanita Craver, MD;  Location: [facility] ENDOSCOPY;  Service: Endoscopy;  Laterality: N/A;  . DEEP NECK LYMPH NODE BIOPSY / EXCISION  2011   Family History:  Family History  Problem Relation Age of Onset  . Diabetes Mother   . Hypertension Mother   . Hypertension Father   . Stroke Maternal Grandfather   . Colon cancer Neg Hx   . Stomach cancer Neg Hx    Family Psychiatric  History: Noncontributory Social History:  Social History   Substance and Sexual  Activity  Alcohol Use Not Currently     Social History   Substance and Sexual Activity  Drug Use Yes  . Types: "Crack" cocaine, Cocaine   Comment: cocai, former user, 06/27/2017    Social History   Socioeconomic History  . Marital status: Single    Spouse name: Not on file  . Number of children: 2  . Years of education: Not on file  . Highest education level: Not on file  Occupational History  . Not on file  Tobacco Use  . Smoking status: Never Smoker  . Smokeless tobacco: Never Used  Vaping Use  . Vaping Use: Never used  Substance and Sexual Activity  . Alcohol use: Not Currently  . Drug use: Yes    Types: "Crack" cocaine, Cocaine    Comment: cocai, former user, 06/27/2017  . Sexual activity: Not Currently    Partners: Male    Birth control/protection: None  Other Topics Concern  . Not on file  Social History Narrative  . Not on file   Social Determinants of Health   Financial Resource Strain: Not on file  Food Insecurity: Not on file  Transportation Needs: Not on file  Physical Activity: Not on file  Stress: Not on file  Social Connections: Not on file    Hospital Course:  (Per Md's admission notes): Patient is seen and examined. Patient is a 30 year old female with a past psychiatric history significant for polysubstance dependence who presented to the behavioral health  hospital as a walk-in on 10/22/2020. The patient had recently been released from jail on 10/20/2020. She immediately went to start using drugs again. She claimed to have used cocaine and opioids. She stated that she was suicidal. The assessing team at that time decided for admission. I am familiar with the patient. The patient had been admitted to the psychiatric hospital in October 2021. She was suicidal at that time. At that time she admitted that she had recently been discharged from the [facility] for substance abuse treatment in [place], [place]. She also was to going to a sober  living facility in [facility], but had relapsed and was not accepted into that facility. She stated that she was in jail for approximately 19 days. She stated she was in jail for trespassing. She was apparently going on a property to use drugs. She has had multiple attempts in rehabilitation facilities, but always leaves early. She apparently went to [place] sometime in 2021. At least it appears that she went after she was discharged from our facility. She stayed there somewhere between 10 to 14 days because she wanted to get high. She is currently homeless, but stated her mother is somewhat supportive. She has been treated with multiple medications in the past including fluoxetine, Trileptal, Seroquel, hydroxyzine, Topamax. She also had been previously treated with buspirone.She stated the longest period of time that she had a sobriety was the 19 days that she was recently in jail. She denied any current charges or court dates. She was admitted to the hospital for evaluation and stabilization.  After the above admission evaluation, Joia's presenting symptoms were noted. She was recommended for mood stabilization treatments. The medication regimen targeting those presenting symptoms were discussed with him/her & initiated with his/her consent. Her UDS on arrival to the ED was positive for Cocaine and Benzodiazepines and alcohol was negative.  She was medicated, stabilized & discharged on the medications as listed on her discharge medication lists below. Besides the mood stabilization treatments, Miyani was also enrolled & participated in the group counseling sessions being offered & held on this unit. She learned coping skills. She presented no other significant pre-existing medical issues that required treatment. She tolerated his treatment regimen without any adverse effects or reactions reported.   During the course of her hospitalization, the 15-minute checks were adequate to  ensure patient's safety. Braileigh did not display any dangerous, violent or suicidal behavior on the unit. She interacted with patients & staff appropriately, participated appropriately in the group sessions/therapies. Her medications were addressed & adjusted to meet her needs. She was recommended for outpatient follow-up care & medication management upon discharge to assure continuity of care & mood stability.  At the time of discharge patient is not reporting any acute suicidal/homicidal ideations. She feels more confident about his/her self-care & in managing his mental health. She currently denies any new issues or concerns. Education and supportive counseling provided throughout his/her hospital stay & upon discharge.   Today, upon her discharge evaluation with the attending psychiatrist, Milena shares she is doing well. She denies any other specific concerns. She is sleeping well. Her appetite is good. She denies other physical complaints. She denies AH/VH, delusional thoughts or paranoia. She does not appear to be responding to any internal stimuli. She feels that her medications have been helpful & is in agreement to continue her current treatment regimen as recommended. She was able to engage in safety planning including plan to return to [facility]  or contact emergency services if she feels unable to maintain her own safety or the safety of others. Pt had no further questions, comments, or concerns. She left [facility] with all personal belongings in no apparent distress. Transportation per [facility] was arranged and patient will discharge to [facility] for continued treatment.   Physical Findings: AIMS: Facial and Oral Movements Muscles of Facial Expression: None, normal Lips and Perioral Area: None, normal Jaw: None, normal Tongue: None, normal,Extremity Movements Upper (arms, wrists, hands, fingers): None, normal Lower (legs, knees, ankles, toes): None, normal, Trunk Movements Neck,  shoulders, hips: None, normal, Overall Severity Severity of abnormal movements (highest score from questions above): None, normal Incapacitation due to abnormal movements: None, normal Patient's awareness of abnormal movements (rate only patient's report): No Awareness, Dental Status Current problems with teeth and/or dentures?: No Does patient usually wear dentures?: No  CIWA:    COWS:  COWS Total Score: 4  Musculoskeletal: Strength & Muscle Tone: within normal limits Gait & Station: normal Patient leans: N/A  Psychiatric Specialty Exam: See Psychiatric Specialty Exam and Suicide Risk Assessment completed by Attending Physician prior to discharge. Physical Exam Constitutional:      Appearance: Normal appearance.  HENT:     Head: Normocephalic and atraumatic.  Pulmonary:     Effort: Pulmonary effort is normal.  Musculoskeletal:        General: Normal range of motion.     Cervical back: Normal range of motion.  Neurological:     General: No focal deficit present.     Mental Status: She is alert and oriented to person, place, and time.  Psychiatric:        Attention and Perception: Attention normal.        Mood and Affect: Mood normal.        Speech: Speech normal.        Behavior: Behavior normal. Behavior is cooperative.        Thought Content: Thought content normal.        Cognition and Memory: Cognition normal.     Review of Systems  Constitutional: Negative for activity change and appetite change.  Respiratory: Negative for chest tightness and shortness of breath.   Cardiovascular: Negative for chest pain.  Gastrointestinal: Negative for abdominal pain.  Neurological: Negative for facial asymmetry and headaches.    Blood pressure 98/61, pulse (!) 105, temperature 98.3 F (36.8 C), temperature source Oral, resp. rate 18, height 5' 10"  (1.778 m), weight 84.4 kg, SpO2 100 %, unknown if currently breastfeeding.Body mass index is 26.69 kg/m.  Sleep:  Number of Hours:  4.75     Have you used any form of tobacco in the last 30 days? (Cigarettes, Smokeless Tobacco, Cigars, and/or Pipes): Patient Refused Screening  Has this patient used any form of tobacco in the last 30 days? (Cigarettes, Smokeless Tobacco, Cigars, and/or Pipes) Yes, Yes, Prescription not provided because: patient refused to participate in intial tobacco screening.   Blood Alcohol level:  Lab Results  Component Value Date   ETH <10 07/05/2020   ETH <10 40/06/2724    Metabolic Disorder Labs:  Lab Results  Component Value Date   HGBA1C 5.2 01/10/2017   No results found for: PROLACTIN Lab Results  Component Value Date   CHOL 109 07/09/2017   TRIG 138 07/09/2017   HDL 44 07/09/2017   CHOLHDL 2.5 07/09/2017   VLDL 28 07/09/2017   LDLCALC 37 07/09/2017      Discharge destination:  Other:  [facility]  Is patient on multiple antipsychotic therapies at discharge:  No   Has Patient had three or more failed trials of antipsychotic monotherapy by history:  Yes,   Antipsychotic medications that previously failed include:   1.  Rexulti., 2.  Seroquel. and 3.  topiramate.  Recommended Plan for Multiple Antipsychotic Therapies: NA  Discharge Instructions    Diet - low sodium heart healthy   Complete by: As directed    Increase activity slowly   Complete by: As directed    Increase activity slowly   Complete by: As directed      Allergies as of 10/25/2020   No Known Allergies     Medication List    STOP taking these medications   busPIRone 15 MG tablet Commonly known as: BUSPAR   FLUoxetine 20 MG capsule Commonly known as: PROZAC   OXcarbazepine 150 MG tablet Commonly known as: TRILEPTAL   topiramate 25 MG tablet Commonly known as: TOPAMAX   traZODone 50 MG tablet Commonly known as: DESYREL     TAKE these medications     Indication  hydrOXYzine 25 MG tablet Commonly known as: ATARAX/VISTARIL Take 1 tablet (25 mg total) by mouth every 6 (six) hours as  needed for anxiety.  Indication: Feeling Anxious   mirtazapine 15 MG tablet Commonly known as: REMERON Take 1 tablet (15 mg total) by mouth at bedtime.  Indication: Major Depressive Disorder   QUEtiapine 400 MG tablet Commonly known as: SEROQUEL Take 1 tablet (400 mg total) by mouth at bedtime. What changed:   medication strength  how much to take  Indication: Depressive Phase of [place]. Go on 10/26/2020.   Specialty: Behavioral Health Why: You have a walk in appointment for therapy services on 10/26/20 at 7:45 am. You also have a walk in appointment for medication management on 11/16/20 at 7:45 am. Walk in appointments are first come, first served and are held in person. Contact information: [place] [phone]              Follow-up recommendations:  Activity:  as tolerated Diet:  Heart healthy  Comments: Prescriptions given at discharge.  Patient agreeable to plan.  Given opportunity to ask questions.  Appears to feel comfortable with discharge denies any current suicidal or homicidal thoughts.   Patient is instructed prior to discharge to:  Take all medications as prescribed by his/her mental healthcare provider. Report any adverse effects and or reactions from the medicines to his/her outpatient provider promptly. Patient has been instructed & cautioned: To not engage in alcohol and or illegal drug use while on prescription medicines. In the event of worsening symptoms, patient is instructed to call the crisis hotline, 911 and or go to the nearest ED for appropriate evaluation and treatment of symptoms. To follow-up with his/her primary care provider for your other medical issues, concerns and or health care needs.    Signed: Ethelene Hal, NP 10/25/2020, 11:28 AM

## 2020-10-25 NOTE — Progress Notes (Signed)
  [facility] Adult Case Management Discharge Plan :  Will you be returning to the same living situation after discharge:  No. At discharge, do you have transportation home?: Yes,  Safe Transport  Do you have the ability to pay for your medications: Yes,  Medicaid   Release of information consent forms completed and in the chart;  Patient's signature needed at discharge.  Patient to Follow up at:  [facility]. Go on 10/26/2020.   Specialty: Behavioral Health Why: You have a walk in appointment for therapy services on 10/26/20 at 7:45 am. You also have a walk in appointment for medication management on 11/16/20 at 7:45 am. Walk in appointments are first come, first served and are held in person. Contact information: [place] [phone]              Next level of care provider has access to [place] and Suicide Prevention discussed: No.  Have you used any form of tobacco in the last 30 days? (Cigarettes, Smokeless Tobacco, Cigars, and/or Pipes): Patient Refused Screening  Has patient been referred to the Quitline?: Patient refused referral  Patient has been referred for addiction treatment: Yes  Darleen Crocker, [place] 10/25/2020, 10:51 AM

## 2020-10-25 NOTE — Plan of Care (Signed)
Nurse discussed coping skills with patient.  

## 2020-10-25 NOTE — BHH Counselor (Signed)
CSW spoke with the administration at [facility] [phone] and was told that Latoya [name] is allowed to come back to the facility but she will need to complete another pre-screening by phone before she can be accepted.  CSW provided Latoya [name] with the phone number to call to complete the pre-screening, instructions for who and what to ask administration for, and the phone number to give the facility to call her back here at the hospital.  CSW asked Latoya [name] to call today.  CSW will follow up with Latoya [name] to see if she completed the pre-screening today.

## 2020-10-25 NOTE — BHH Suicide Risk Assessment (Signed)
[facility] Discharge Suicide Risk Assessment   Principal Problem: Bipolar I disorder, most recent episode mixed [facility]) Discharge Diagnoses: Principal Problem:   Bipolar I disorder, most recent episode mixed ([place]) Active Problems:   MDD (major depressive disorder)   Total Time spent with patient: 15 minutes  Musculoskeletal: Strength & Muscle Tone: within normal limits Gait & Station: normal Patient leans: N/A  Psychiatric Specialty Exam: Review of Systems  All other systems reviewed and are negative.   Blood pressure 98/61, pulse (!) 105, temperature 98.3 F (36.8 C), temperature source Oral, resp. rate 18, height 5' 10"  (1.778 m), weight 84.4 kg, SpO2 100 %, unknown if currently breastfeeding.Body mass index is 26.69 kg/m.  General Appearance: Fairly Groomed  Engineer, water::  Fair  Speech:  Normal [id number]  Volume:  Normal  Mood:  Irritable  Affect:  Congruent  Thought Process:  Coherent and Descriptions of Associations: Circumstantial  Orientation:  Full (Time, Place, and Person)  Thought Content:  Rumination  Suicidal Thoughts:  No  Homicidal Thoughts:  No  Memory:  Immediate;   Fair Recent;   Fair Remote;   Fair  Judgement:  Impaired  Insight:  Lacking  Psychomotor Activity:  Normal  Concentration:  Fair  Recall:  [employer] of Knowledge:Fair  Language: Good  Akathisia:  Negative  Handed:  Right  AIMS (if indicated):     Assets:  Desire for Improvement Resilience  Sleep:  Number of Hours: 4.75  Cognition: WNL  ADL's:  Intact   Mental Status Per Nursing Assessment::   On Admission:  Self-harm thoughts  Demographic Factors:  Low socioeconomic status, Living alone and Unemployed  Loss Factors: Legal issues and Financial problems/change in socioeconomic status  Historical Factors: Impulsivity  Risk Reduction Factors:   NA  Continued Clinical Symptoms:  Alcohol/Substance Abuse/Dependencies Personality Disorders:   Cluster B  Cognitive Features That  Contribute To Risk:  Thought constriction (tunnel vision)    Suicide Risk:  Minimal: No identifiable suicidal ideation.  Patients presenting with no risk factors but with morbid ruminations; may be classified as minimal risk based on the severity of the depressive symptoms    Plan Of Care/Follow-up recommendations:  Activity:  ad lib  Sharma Covert, MD 10/25/2020, 8:14 AM

## 2020-10-25 NOTE — Progress Notes (Addendum)
D:  Patient denied SI and HI.  Denied A/V hallucinations.  Denied pain.  Patient continually sleeps in bed.  Gets out of bed about lunch time. A:  Medications refused early morning.  Takes medications about lunch time. R:  Safety maintained with 15 minute checks.

## 2020-10-25 NOTE — Progress Notes (Signed)
Recreation Therapy Notes  Date: 1.31.22 Time: 0930 Location: 300 Hall Dayroom  Group Topic: Stress Management  Goal Area(s) Addresses:  Patient will identify positive stress management techniques. Patient will identify benefits of using stress management post d/c.  Intervention: Stress Management  Activity:  Meditation.  LRT played a meditation that focused on releasing and calming anxiety.  Patients were to listen and follow along as meditation played to take note of how they were feeling and any thoughts they were having without letting those feelings and thoughts take control.    Education:  Stress Management, Discharge Planning.   Education Outcome: Acknowledges Education  Clinical Observations/Feedback: Pt did not attend group session.    Jazon Jipson, LRT/CTRS         Kay Shippy A 10/25/2020 11:53 AM 

## 2020-11-12 ENCOUNTER — Encounter (HOSPITAL_COMMUNITY): Payer: Self-pay | Admitting: Emergency Medicine

## 2020-11-12 ENCOUNTER — Emergency Department (HOSPITAL_COMMUNITY)
Admission: EM | Admit: 2020-11-12 | Discharge: 2020-11-15 | Disposition: A | Discharge status: Home / Self Care | Payer: Medicaid Other | Attending: Emergency Medicine | Admitting: Emergency Medicine

## 2020-11-12 DIAGNOSIS — R45851 Suicidal ideations: Secondary | ICD-10-CM | POA: Diagnosis not present

## 2020-11-12 DIAGNOSIS — Z79899 Other long term (current) drug therapy: Secondary | ICD-10-CM | POA: Insufficient documentation

## 2020-11-12 DIAGNOSIS — U071 COVID-19: Secondary | ICD-10-CM | POA: Insufficient documentation

## 2020-11-12 DIAGNOSIS — F1994 Other psychoactive substance use, unspecified with psychoactive substance-induced mood disorder: Secondary | ICD-10-CM

## 2020-11-12 DIAGNOSIS — F1110 Opioid abuse, uncomplicated: Secondary | ICD-10-CM | POA: Diagnosis present

## 2020-11-12 DIAGNOSIS — F322 Major depressive disorder, single episode, severe without psychotic features: Secondary | ICD-10-CM | POA: Insufficient documentation

## 2020-11-12 DIAGNOSIS — F1924 Other psychoactive substance dependence with psychoactive substance-induced mood disorder: Secondary | ICD-10-CM | POA: Diagnosis not present

## 2020-11-12 DIAGNOSIS — F1494 Cocaine use, unspecified with cocaine-induced mood disorder: Secondary | ICD-10-CM | POA: Diagnosis not present

## 2020-11-12 DIAGNOSIS — F0634 Mood disorder due to known physiological condition with mixed features: Secondary | ICD-10-CM | POA: Diagnosis present

## 2020-11-12 LAB — COMPREHENSIVE METABOLIC PANEL
ALT: 19 U/L (ref 0–44)
AST: 20 U/L (ref 15–41)
Albumin: 4.1 g/dL (ref 3.5–5.0)
Alkaline Phosphatase: 57 U/L (ref 38–126)
Anion gap: 9 (ref 5–15)
BUN: 6 mg/dL (ref 6–20)
CO2: 24 mmol/L (ref 22–32)
Calcium: 9 mg/dL (ref 8.9–10.3)
Chloride: 105 mmol/L (ref 98–111)
Creatinine, Ser: 0.74 mg/dL (ref 0.44–1.00)
GFR, Estimated: >60 mL/min (ref >60)
Glucose, Bld: 115 mg/dL — ABNORMAL HIGH (ref 70–99)
Potassium: 3.4 mmol/L — ABNORMAL LOW (ref 3.5–5.1)
Sodium: 138 mmol/L (ref 135–145)
Total Bilirubin: 0.7 mg/dL (ref 0.3–1.2)
Total Protein: 7.5 g/dL (ref 6.5–8.1)

## 2020-11-12 LAB — RESP PANEL BY RT-PCR (FLU A&B, COVID) ARPGX2
Influenza A by PCR: NEGATIVE
Influenza B by PCR: NEGATIVE
SARS Coronavirus 2 by RT PCR: POSITIVE — AB

## 2020-11-12 LAB — CBC
HCT: 33.9 % — ABNORMAL LOW (ref 36.0–46.0)
Hemoglobin: 10.3 g/dL — ABNORMAL LOW (ref 12.0–15.0)
MCH: 25.8 pg — ABNORMAL LOW (ref 26.0–34.0)
MCHC: 30.4 g/dL (ref 30.0–36.0)
MCV: 84.8 fL (ref 80.0–100.0)
Platelets: 379 10*3/uL (ref 150–400)
RBC: 4 MIL/uL (ref 3.87–5.11)
RDW: 14.5 % (ref 11.5–15.5)
WBC: 6.1 10*3/uL (ref 4.0–10.5)
nRBC: 0 % (ref 0.0–0.2)

## 2020-11-12 LAB — I-STAT BETA HCG BLOOD, ED (MC, WL, AP ONLY): I-stat hCG, quantitative: <5 m[IU]/mL (ref <5)

## 2020-11-12 LAB — ETHANOL: Alcohol, Ethyl (B): <10 mg/dL (ref <10)

## 2020-11-12 LAB — ACETAMINOPHEN LEVEL: Acetaminophen (Tylenol), Serum: <10 ug/mL — ABNORMAL LOW (ref 10–30)

## 2020-11-12 LAB — SALICYLATE LEVEL: Salicylate Lvl: <7 mg/dL — ABNORMAL LOW (ref 7.0–30.0)

## 2020-11-12 MED ORDER — NAPROXEN 250 MG PO TABS
500.0000 mg | ORAL_TABLET | Freq: Two times a day (BID) | ORAL | Status: DC | PRN
  Administered 2020-11-13: 500 mg via ORAL
  Filled 2020-11-12: qty 2

## 2020-11-12 MED ORDER — DICYCLOMINE HCL 20 MG PO TABS
20.0000 mg | ORAL_TABLET | Freq: Four times a day (QID) | ORAL | Status: DC | PRN
Start: 2020-11-12 — End: 2020-11-15

## 2020-11-12 MED ORDER — CLONIDINE HCL 0.2 MG PO TABS
0.1000 mg | ORAL_TABLET | Freq: Two times a day (BID) | ORAL | Status: DC
  Administered 2020-11-14 – 2020-11-15 (×2): 0.1 mg via ORAL
  Filled 2020-11-12 (×2): qty 1

## 2020-11-12 MED ORDER — CLONIDINE HCL 0.2 MG PO TABS: 0.1000 mg | ORAL_TABLET | Freq: Every day | ORAL | Status: DC

## 2020-11-12 MED ORDER — QUETIAPINE FUMARATE 200 MG PO TABS
400.0000 mg | ORAL_TABLET | Freq: Every day | ORAL | Status: DC
  Administered 2020-11-12 – 2020-11-14 (×3): 400 mg via ORAL
  Filled 2020-11-12: qty 2
  Filled 2020-11-12: qty 1
  Filled 2020-11-12: qty 2
  Filled 2020-11-12: qty 1

## 2020-11-12 MED ORDER — NICOTINE 21 MG/24HR TD PT24: 21.0000 mg | MEDICATED_PATCH | Freq: Every day | TRANSDERMAL | Status: DC

## 2020-11-12 MED ORDER — POTASSIUM CHLORIDE CRYS ER 20 MEQ PO TBCR
20.0000 meq | EXTENDED_RELEASE_TABLET | Freq: Once | ORAL | Status: AC
  Administered 2020-11-12: 20 meq via ORAL
  Filled 2020-11-12: qty 1

## 2020-11-12 MED ORDER — LOPERAMIDE HCL 2 MG PO CAPS: 2.0000 mg | ORAL_CAPSULE | ORAL | Status: DC | PRN

## 2020-11-12 MED ORDER — ALUM & MAG HYDROXIDE-SIMETH 200-200-20 MG/5ML PO SUSP: 30.0000 mL | Freq: Four times a day (QID) | ORAL | Status: DC | PRN

## 2020-11-12 MED ORDER — CLONIDINE HCL 0.1 MG PO TABS
0.1000 mg | ORAL_TABLET | Freq: Four times a day (QID) | ORAL | Status: AC
  Administered 2020-11-12 – 2020-11-14 (×6): 0.1 mg via ORAL
  Filled 2020-11-12 (×6): qty 1

## 2020-11-12 MED ORDER — MIRTAZAPINE 15 MG PO TABS
15.0000 mg | ORAL_TABLET | Freq: Every day | ORAL | Status: DC
  Administered 2020-11-12 – 2020-11-14 (×3): 15 mg via ORAL
  Filled 2020-11-12 (×4): qty 1

## 2020-11-12 MED ORDER — ACETAMINOPHEN 325 MG PO TABS
650.0000 mg | ORAL_TABLET | Freq: Four times a day (QID) | ORAL | Status: DC | PRN
  Administered 2020-11-12 – 2020-11-15 (×5): 650 mg via ORAL
  Filled 2020-11-12 (×6): qty 2

## 2020-11-12 MED ORDER — ONDANSETRON 4 MG PO TBDP: 4.0000 mg | ORAL_TABLET | Freq: Four times a day (QID) | ORAL | Status: DC | PRN

## 2020-11-12 MED ORDER — HYDROXYZINE HCL 25 MG PO TABS
25.0000 mg | ORAL_TABLET | Freq: Four times a day (QID) | ORAL | Status: DC | PRN
Start: 2020-11-12 — End: 2020-11-15
  Administered 2020-11-14: 25 mg via ORAL
  Filled 2020-11-12: qty 1

## 2020-11-12 MED ORDER — METHOCARBAMOL 500 MG PO TABS: 500.0000 mg | ORAL_TABLET | Freq: Three times a day (TID) | ORAL | Status: DC | PRN

## 2020-11-12 NOTE — ED Notes (Signed)
Pt moved to RM 53 for TTS. Pt left room and went back to stretcher in hallway and stated that she wanted to go home. After speaking with pt she agreed to stay and speak with TTS. Pt is back in RM 53 and speaking with TTS.

## 2020-11-12 NOTE — ED Notes (Signed)
Tele psych machine to bedside

## 2020-11-12 NOTE — ED Notes (Signed)
Pt COVID positive, EDP Darl Householder made aware.

## 2020-11-12 NOTE — ED Triage Notes (Signed)
Pt arrives [facility] from a house where she was using drugs she states she has been using daily heroine and crack in hopes to overdoes, she states she has been using for 8 years and cant get clean so she wishes to harm herself. Hx of OD attempts in the past

## 2020-11-12 NOTE — ED Provider Notes (Signed)
[facility] EMERGENCY DEPARTMENT Provider Note   CSN: [id number] Arrival date & time: 11/12/20  1328     History Chief Complaint  Patient presents with  . Suicidal    Latoya [name] is a 30 y.o. female.  HPI 30 year old female presents with suicidal thoughts.  Started this morning.  She states this is related to her drug abuse which includes heroin every few days and nearly daily crack cocaine use.  This is a recurrent problem for her.  She has attempted overdose in the past and currently has thoughts of wanting to overdose.  She denies any medical complaints such as fever or shortness of breath.   Past Medical History:  Diagnosis Date  . Anemia   . Anxiety   . Arthritis   . Blood transfusion without reported diagnosis   . Crohn's disease ([place])   . Depression   . Drug-seeking behavior   . Gestational diabetes   . History of substance abuse ([place])   . HSV infection   . Pregnancy induced hypertension   . Substance abuse [facility])     Patient Active Problem List   Diagnosis Date Noted  . Bipolar I disorder, most recent episode mixed ([place]) 10/23/2020  . Depression with suicidal ideation 10/22/2020  . Heroin abuse ([place]) 07/10/2020  . Bipolar and related disorder due to another medical condition with mixed features 07/10/2020  . MDD (major depressive disorder) 07/06/2020  . Post partum depression 07/12/2017  . IBD (inflammatory bowel disease)   . Acute pancreatitis   . Iron deficiency anemia due to chronic blood loss   . Heme positive stool   . Pancolitis ([place]) 07/04/2017  . Generalized abdominal pain   . Diarrhea   . Major depressive disorder, recurrent severe without psychotic features ([place]) 06/29/2017  . Cocaine abuse ([place]) 06/29/2017  . SVD (spontaneous vaginal delivery) 05/15/2017  . Crack cocaine use 05/15/2017  . Cervical dysplasia 01/16/2017  . Obesity (BMI 30.0-34.9) 01/12/2017  . History of substance abuse ([place]) 01/12/2017  . History of  gestational diabetes mellitus (GDM) 01/12/2017  . History of gestational hypertension 01/12/2017  . History of macrosomia in infant in prior pregnancy, currently pregnant 01/12/2017  . Short interval between pregnancies affecting pregnancy in second trimester, antepartum 01/12/2017  . Late prenatal care 01/12/2017  . Paxil use in early pregnancy 01/12/2017  . History of suicide attempt 01/12/2017  . Anxiety and depression 01/12/2017  . Supervision of high risk pregnancy, antepartum 01/10/2017  . GDM (gestational diabetes mellitus) 02/25/2016  . Obesity in pregnancy 02/25/2016  . Anemia 02/08/2016    Past Surgical History:  Procedure Laterality Date  . CHOLECYSTECTOMY    . COLONOSCOPY N/A 07/07/2017   Procedure: COLONOSCOPY;  Surgeon: Juanita Craver, MD;  Location[facility]COPY;  Service: Endoscopy;  Laterality: N/A;  . DEEP NECK LYMPH NODE BIOPSY / EXCISION  2011     OB History    Gravida  2   Para  2   Term  2   Preterm  0   AB  0   Living  2     SAB  0   IAB  0   Ectopic  0   Multiple  0   Live Births  2           Family History  Problem Relation Age of Onset  . Diabetes Mother   . Hypertension Mother   . Hypertension Father   . Stroke Maternal Grandfather   . Colon cancer  Neg Hx   . Stomach cancer Neg Hx     Social History   Tobacco Use  . Smoking status: Never Smoker  . Smokeless tobacco: Never Used  Vaping Use  . Vaping Use: Never used  Substance Use Topics  . Alcohol use: Not Currently  . Drug use: Yes    Types: "Crack" cocaine, Cocaine    Comment: cocai, former user, 06/27/2017    Home Medications Prior to Admission medications   Medication Sig Start Date End Date Taking? Authorizing Provider  hydrOXYzine (ATARAX/VISTARIL) 25 MG tablet Take 1 tablet (25 mg total) by mouth every 6 (six) hours as needed for anxiety. 10/25/20   Sharma Covert, MD  mirtazapine (REMERON) 15 MG tablet Take 1 tablet (15 mg total) by mouth at bedtime.  10/25/20   Sharma Covert, MD  QUEtiapine (SEROQUEL) 400 MG tablet Take 1 tablet (400 mg total) by mouth at bedtime. 10/25/20   Sharma Covert, MD  hyoscyamine (LEVSIN SL) 0.125 MG SL tablet Place 1 tablet (0.125 mg total) every 4 (four) hours as needed under the tongue. Patient not taking: Reported on 07/03/2018 08/03/17 07/09/19  Levin Erp, PA  mesalamine (APRISO) 0.375 g 24 hr capsule Take 4 capsules (1.5 g total) by mouth daily. Patient not taking: Reported on 07/03/2018 11/26/17 07/09/19  Mauri Pole, MD  omeprazole (PRILOSEC) 40 MG capsule TAKE 1 CAPSULE (40 MG TOTAL) DAILY BY MOUTH. Patient not taking: Reported on 07/03/2018 11/19/17 07/09/19  Mauri Pole, MD  PARoxetine (PAXIL-CR) 25 MG 24 hr tablet Take 1 tablet (25 mg total) by mouth daily. Patient not taking: Reported on 07/03/2018 05/16/17 07/09/19  Emily Filbert, MD    Allergies    Patient has no known allergies.  Review of Systems   Review of Systems  Constitutional: Negative for fever.  Respiratory: Negative for shortness of breath.   Gastrointestinal: Negative for abdominal pain.  Psychiatric/Behavioral: Positive for suicidal ideas.  All other systems reviewed and are negative.   Physical Exam Updated Vital Signs BP 109/64 (BP Location: Right Arm)   Pulse (!) 114   Temp (!) 102.6 F (39.2 C) (Oral)   Resp 16   SpO2 99%   Physical Exam Vitals and nursing note reviewed.  Constitutional:      General: She is not in acute distress.    Appearance: She is well-developed and well-nourished. She is not ill-appearing or diaphoretic.  HENT:     Head: Normocephalic and atraumatic.     Right Ear: External ear normal.     Left Ear: External ear normal.     Nose: Nose normal.  Eyes:     General:        Right eye: No discharge.        Left eye: No discharge.  Cardiovascular:     Rate and Rhythm: Normal rate and regular rhythm.     Heart sounds: Normal heart sounds.  Pulmonary:     Effort:  Pulmonary effort is normal.     Breath sounds: Normal breath sounds.  Abdominal:     Palpations: Abdomen is soft.     Tenderness: There is no abdominal tenderness.  Skin:    General: Skin is warm and dry.  Neurological:     Mental Status: She is alert.  Psychiatric:        Mood and Affect: Mood is not anxious.        Thought Content: Thought content includes suicidal ideation. Thought content includes  suicidal plan.     ED Results / Procedures / Treatments   Labs (all labs ordered are listed, but only abnormal results are displayed) Labs Reviewed  RESP PANEL BY RT-PCR (FLU A&B, COVID) ARPGX2 - Abnormal; Notable for the following components:      Result Value   SARS Coronavirus 2 by RT PCR POSITIVE (*)    All other components within normal limits  COMPREHENSIVE METABOLIC PANEL - Abnormal; Notable for the following components:   Potassium 3.4 (*)    Glucose, Bld 115 (*)    All other components within normal limits  SALICYLATE LEVEL - Abnormal; Notable for the following components:   Salicylate Lvl <5.0 (*)    All other components within normal limits  ACETAMINOPHEN LEVEL - Abnormal; Notable for the following components:   Acetaminophen (Tylenol), Serum <10 (*)    All other components within normal limits  CBC - Abnormal; Notable for the following components:   Hemoglobin 10.3 (*)    HCT 33.9 (*)    MCH 25.8 (*)    All other components within normal limits  ETHANOL  RAPID URINE DRUG SCREEN, HOSP PERFORMED  I-STAT BETA HCG BLOOD, ED ([facility], [facility], [facility] ONLY)    EKG None  Radiology No results found.  Procedures Procedures   Medications Ordered in ED Medications  cloNIDine (CATAPRES) tablet 0.1 mg (0.1 mg Oral Given 11/12/20 2130)    Followed by  cloNIDine (CATAPRES) tablet 0.1 mg (has no administration in time range)    Followed by  cloNIDine (CATAPRES) tablet 0.1 mg (has no administration in time range)  dicyclomine (BENTYL) tablet 20 mg (has no administration in time  range)  hydrOXYzine (ATARAX/VISTARIL) tablet 25 mg (has no administration in time range)  loperamide (IMODIUM) capsule 2-4 mg (has no administration in time range)  methocarbamol (ROBAXIN) tablet 500 mg (has no administration in time range)  naproxen (NAPROSYN) tablet 500 mg (has no administration in time range)  ondansetron (ZOFRAN-ODT) disintegrating tablet 4 mg (has no administration in time range)  nicotine (NICODERM CQ - dosed in mg/24 hours) patch 21 mg (21 mg Transdermal Patient Refused/Not Given 11/12/20 1744)  alum & mag hydroxide-simeth (MAALOX/MYLANTA) 200-200-20 MG/5ML suspension 30 mL (has no administration in time range)  mirtazapine (REMERON) tablet 15 mg (15 mg Oral Given 11/12/20 2130)  QUEtiapine (SEROQUEL) tablet 400 mg (400 mg Oral Given 11/12/20 2131)  acetaminophen (TYLENOL) tablet 650 mg (650 mg Oral Given 11/12/20 2137)  potassium chloride SA (KLOR-CON) CR tablet 20 mEq (20 mEq Oral Given 11/12/20 1747)    ED Course  I have reviewed the triage vital signs and the nursing notes.  Pertinent labs & imaging results that were available during my care of the patient were reviewed by me and considered in my medical decision making (see chart for details).    MDM Rules/Calculators/A&P                          Patient is here for suicidal thoughts.  Labs are overall unremarkable besides mild hypokalemia.  She appears medically stable for psychiatric consultation.  Patient is incidentally found to have COVID-19.  She had denied any medical symptoms upon seeing her in the ED though she has now spiked a fever.  Psychiatry is recommending overnight observation.  The patient has been placed in psychiatric observation due to the need to provide a safe environment for the patient while obtaining psychiatric consultation and evaluation, as well as ongoing medical  and medication management to treat the patient's condition.  The patient has not been placed under full IVC at this time.   Final Clinical Impression(s) / ED Diagnoses Final diagnoses:  Suicidal ideation  COVID-19 virus infection    Rx / DC Orders ED Discharge Orders    None       Sherwood Gambler, MD 11/12/20 2329

## 2020-11-12 NOTE — ED Notes (Signed)
Pt was given ginger ale and crackers.

## 2020-11-12 NOTE — ED Notes (Signed)
In triage [place]

## 2020-11-12 NOTE — BH Assessment (Signed)
Roderic Palau, RN and Festus Aloe, RN notified that  per [place] Alar, NP, patient recommended for overnight observation. Patient is voluntary at this time and is agreeable to go to [facility]. Letitia Libra, NP accepting provider. Call report to [phone] or [phone].
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
## 2020-11-12 NOTE — BH Assessment (Signed)
Pt is COVID positive and will need to remain in ED for overnight observation.

## 2020-11-12 NOTE — ED Notes (Signed)
Secretary to order pt dinner tray

## 2020-11-12 NOTE — ED Notes (Signed)
Pt provided a specimen cup and encouraged to provide urine per MD order.

## 2020-11-13 DIAGNOSIS — R45851 Suicidal ideations: Secondary | ICD-10-CM

## 2020-11-13 DIAGNOSIS — F1994 Other psychoactive substance use, unspecified with psychoactive substance-induced mood disorder: Secondary | ICD-10-CM

## 2020-11-13 NOTE — ED Notes (Signed)
Provided cheese and crackers and a soda per patient request.

## 2020-11-13 NOTE — ED Notes (Signed)
Pt belongings locked in locker # 1.

## 2020-11-13 NOTE — ED Notes (Signed)
Pt belongings were [place]. Inventoried belongings that were found.

## 2020-11-13 NOTE — ED Notes (Signed)
Tele psych machine to bedside

## 2020-11-13 NOTE — ED Notes (Signed)
Pt give a cup of sprite, graham crackers, and peanut butter.

## 2020-11-13 NOTE — ED Notes (Signed)
Sitter leaving at 10. Staffing aware

## 2020-11-13 NOTE — ED Notes (Signed)
Lunch provided.

## 2020-11-13 NOTE — ED Notes (Signed)
Provided graham crackers and peanut butter per patient request and RN A.S. okay.

## 2020-11-13 NOTE — Care Management (Signed)
Per Merian Capron, NP - patient meets inpatient criteria.  Per [facility], no appropriate beds at [facility] and the patient should be faxed to other facilities.   Writer sent patient out to the following facilities:  [facility] Details Fax  [address] [place] [address]  Internal comment [facility] Details Fax  [address], [place] [address]  Internal comment [facility] Details Fax  [address], [place] [address]  Internal comment [facility] Details Fax  [address] [place] [address]  Internal comment [facility] Details Fax  [address], [place] [address]  Internal comment [facility] Details Fax  [address] [place], [place] [address]  Internal comment [facility] Details Fax  [address] [place] [address]  Internal comment [facility] Details Fax  [address] [place] [address]  Internal comment [facility] Details Fax  [phone]. [address], [place] [address]  Internal comment [facility] Details Fax  [address], [place] [address]  Internal comment [facility] Details Fax  [address], [place] [address]  Internal comment [facility] Details Fax  [place], [place] [address]  Internal comment [facility] Details Fax  [address] [place] [address]  Internal comment [place] Details Fax  [address] [place] [address]  Internal comment [facility] Details Fax  [address], [place] [address]  Internal comment [facility] Details Fax  [address] [place] [address]  Internal comment [facility] Details Fax  [place], [place] [address]   Internal comment [place] Details Fax  [place] [place]  [address]  Internal comment

## 2020-11-13 NOTE — Consult Note (Signed)
Telepsych Consultation   Reason for Consult:  Suicidal ideations with plan to overdose Referring Physician:  EDP Location of Patient: [facility] Location of Provider: Other: [facility]  Patient Identification: Latoya [name] MRN:  [id number] Principal Diagnosis: Psychoactive substance-induced mood disorder ([place]) Diagnosis:  Principal Problem:   Psychoactive substance-induced mood disorder ([place]) Active Problems:   Bipolar and related disorder due to another medical condition with mixed features  On evaluation today 11/13/2020:  Patient seen via telepsych by this provider; chart reviewed and consulted with Dr. Dwyane Dee on 11/13/20.  On evaluation Latoya [name] has a significant history for polysubstance use disorder, currently presents with substance induced mood disorder, plans to overdose on drugs if discharged.  She presents very depressed, hopeless, withdrawn  and makes minimal eye contact with this Probation officer during assessment.  She appears remorseful and embarassed regarding her continued drug usage.  Tells me she is homeless, but is able to stay with the "man who does drugs with me."  States it is hard to not use drugs when she is exposed to it everyday.   Of note, she states she was incarcerated last month when he trespassed her from is house.  Reports limitied familial support, her mother does lives locally but "supports me when I am doing the right thing and not using drugs."  She tells me she feels powerless to stop using, and wants to kill herself because of this.  Engaged in empathetic listening, positive regard offered as well as sobering conversation regarding adherence to mental health treatment for therapy and med mgmt to help with addiction.    She has been hospitalized many times for psychiatric and substance tx, most recent psychiatric hospitalization was 1/26-1/28/2022 at [facility].  She has also had many failed attempts at sobriety, recently incarcerated in January and was  able to maintain sobriety for 19 days.  Stating usig heroine, cocaine when she was released.     Total Time spent with patient: 30 minutes    Past Psychiatric History: as outlined below  Risk to Self:   Risk to Others:   Prior Inpatient Therapy:   Prior Outpatient Therapy:    Past Medical History:  Past Medical History:  Diagnosis Date  . Anemia   . Anxiety   . Arthritis   . Blood transfusion without reported diagnosis   . Crohn's disease ([place])   . Depression   . Drug-seeking behavior   . Gestational diabetes   . History of substance abuse ([place])   . HSV infection   . Pregnancy induced hypertension   . Substance abuse [facility])     Past Surgical History:  Procedure Laterality Date  . CHOLECYSTECTOMY    . COLONOSCOPY N/A 07/07/2017   Procedure: COLONOSCOPY;  Surgeon: Juanita Craver, MD;  Location: [facility] ENDOSCOPY;  Service: Endoscopy;  Laterality: N/A;  . DEEP NECK LYMPH NODE BIOPSY / EXCISION  2011   Family History:  Family History  Problem Relation Age of Onset  . Diabetes Mother   . Hypertension Mother   . Hypertension Father   . Stroke Maternal Grandfather   . Colon cancer Neg Hx   . Stomach cancer Neg Hx    Family Psychiatric  History: unknown Social History:  Social History   Substance and Sexual Activity  Alcohol Use Not Currently     Social History   Substance and Sexual Activity  Drug Use Yes  . Types: "Crack" cocaine, Cocaine   Comment: cocai, former user, 06/27/2017  Social History   Socioeconomic History  . Marital status: Single    Spouse name: Not on file  . Number of children: 2  . Years of education: Not on file  . Highest education level: Not on file  Occupational History  . Not on file  Tobacco Use  . Smoking status: Never Smoker  . Smokeless tobacco: Never Used  Vaping Use  . Vaping Use: Never used  Substance and Sexual Activity  . Alcohol use: Not Currently  . Drug use: Yes    Types: "Crack" cocaine, Cocaine    Comment: cocai,  former user, 06/27/2017  . Sexual activity: Not Currently    Partners: Male    Birth control/protection: None  Other Topics Concern  . Not on file  Social History Narrative  . Not on file   Social Determinants of Health   Financial Resource Strain: Not on file  Food Insecurity: Not on file  Transportation Needs: Not on file  Physical Activity: Not on file  Stress: Not on file  Social Connections: Not on file   Additional Social History:    Allergies:  No Known Allergies  Labs:  Results for orders placed or performed during the hospital encounter of 11/12/20 (from the past 48 hour(s))  Comprehensive metabolic panel     Status: Abnormal   Collection Time: 11/12/20  1:36 PM  Result Value Ref Range   Sodium 138 135 - 145 mmol/L   Potassium 3.4 (L) 3.5 - 5.1 mmol/L   Chloride 105 98 - 111 mmol/L   CO2 24 22 - 32 mmol/L   Glucose, Bld 115 (H) 70 - 99 mg/dL    Comment: Glucose reference range applies only to samples taken after fasting for at least 8 hours.   BUN 6 6 - 20 mg/dL   Creatinine, Ser 0.74 0.44 - 1.00 mg/dL   Calcium 9.0 8.9 - 10.3 mg/dL   Total Protein 7.5 6.5 - 8.1 g/dL   Albumin 4.1 3.5 - 5.0 g/dL   AST 20 15 - 41 U/L   ALT 19 0 - 44 U/L   Alkaline Phosphatase 57 38 - 126 U/L   Total Bilirubin 0.7 0.3 - 1.2 mg/dL   GFR, Estimated >60 >60 mL/min    Comment: (NOTE) Calculated using the CKD-EPI Creatinine Equation (2021)    Anion gap 9 5 - 15    Comment: Performed at [facility] [address] [place], [place] [address]  Ethanol     Status: None   Collection Time: 11/12/20  1:36 PM  Result Value Ref Range   Alcohol, Ethyl (B) <10 <10 mg/dL    Comment: (NOTE) Lowest detectable limit for serum alcohol is 10 mg/dL.  For medical purposes only. Performed at [facility], [place] [address], [place], [place] [address]   Salicylate level     Status: Abnormal   Collection Time: 11/12/20  1:36 PM  Result Value Ref Range   Salicylate Lvl <4.2 (L) 7.0  - 30.0 mg/dL    Comment: Performed at [facility] [address], [place], [place] [address]  Acetaminophen level     Status: Abnormal   Collection Time: 11/12/20  1:36 PM  Result Value Ref Range   Acetaminophen (Tylenol), Serum <10 (L) 10 - 30 ug/mL    Comment: (NOTE) Therapeutic concentrations vary significantly. A range of 10-30 ug/mL  may be an effective concentration for many patients. However, some  are best treated at concentrations outside of  this range. Acetaminophen concentrations >150 ug/mL at 4 hours after ingestion  and >50 ug/mL at 12 hours after ingestion are often associated with  toxic reactions.  Performed at [facility], [place] [address] [place], [place] [address]   cbc     Status: Abnormal   Collection Time: 11/12/20  1:36 PM  Result Value Ref Range   WBC 6.1 4.0 - 10.5 K/uL   RBC 4.00 3.87 - 5.11 MIL/uL   Hemoglobin 10.3 (L) 12.0 - 15.0 g/dL   HCT 33.9 (L) 36.0 - 46.0 %   MCV 84.8 80.0 - 100.0 fL   MCH 25.8 (L) 26.0 - 34.0 pg   MCHC 30.4 30.0 - 36.0 g/dL   RDW 14.5 11.5 - 15.5 %   Platelets 379 150 - 400 K/uL   nRBC 0.0 0.0 - 0.2 %    Comment: Performed at [facility], [place] [address], [place], [place] [address]  I-Stat beta hCG blood, ED     Status: None   Collection Time: 11/12/20  1:47 PM  Result Value Ref Range   I-stat hCG, quantitative <5.0 <5 mIU/mL   Comment 3            Comment:   GEST. AGE      CONC.  (mIU/mL)   <=1 WEEK        5 - 50     2 WEEKS       50 - 500     3 WEEKS       100 - 10,000     4 WEEKS     1,000 - 30,000        FEMALE AND NON-PREGNANT FEMALE:     LESS THAN 5 mIU/mL   Resp Panel by RT-PCR (Flu A&B, Covid) Nasopharyngeal Swab     Status: Abnormal   Collection Time: 11/12/20  5:35 PM   Specimen: Nasopharyngeal Swab; Nasopharyngeal(NP) swabs in vial transport medium  Result Value Ref Range   SARS Coronavirus 2 by RT PCR POSITIVE (A) NEGATIVE    Comment: RESULT CALLED TO, READ BACK BY AND VERIFIED  WITH: K,ISLEY @1906  11/12/20 EB (NOTE) SARS-CoV-2 target nucleic acids are DETECTED.  The SARS-CoV-2 RNA is generally detectable in upper respiratory specimens during the acute phase of infection. Positive results are indicative of the presence of the identified virus, but do not rule out bacterial infection or co-infection with other pathogens not detected by the test. Clinical correlation with patient history and other diagnostic information is necessary to determine patient infection status. The expected result is Negative.  Fact Sheet for Patients: EntrepreneurPulse.com.au  Fact Sheet for Healthcare Providers: IncredibleEmployment.be  This test is not yet approved or cleared by the [place] FDA and  has been authorized for detection and/or diagnosis of SARS-CoV-2 by FDA under an Emergency Use Authorization (EUA).  This EUA will remain in effect (meaning this test can be used) fo r the duration of  the COVID-19 declaration under Section 564(b)(1) of the Act, 21 U.S.C. section 360bbb-3(b)(1), unless the authorization is terminated or revoked sooner.     Influenza A by PCR NEGATIVE NEGATIVE   Influenza B by PCR NEGATIVE NEGATIVE    Comment: (NOTE) The Xpert Xpress SARS-CoV-2/FLU/RSV plus assay is intended as an aid in the diagnosis of influenza from Nasopharyngeal swab specimens and should not be used as a sole basis for treatment. Nasal washings and aspirates are unacceptable for Xpert Xpress SARS-CoV-2/FLU/RSV testing.  Fact Sheet for Patients: EntrepreneurPulse.com.au  Fact Sheet for Healthcare Providers: IncredibleEmployment.be  This test is not yet approved or cleared by the [place] FDA and has been authorized for detection and/or diagnosis of SARS-CoV-2 by FDA under an Emergency Use Authorization (EUA). This EUA will remain in effect (meaning this test can be used) for the duration of  the COVID-19 declaration under Section 564(b)(1) of the Act, 21 U.S.C. section 360bbb-3(b)(1), unless the authorization is terminated or revoked.  Performed at [facility], [place] [address], [place], [place] [address]     Medications:  Current Facility-Administered Medications  Medication Dose Route Frequency Provider Last Rate Last Admin  . acetaminophen (TYLENOL) tablet 650 mg  650 mg Oral Q6H PRN Sherwood Gambler, MD   650 mg at 11/13/20 0416  . alum & mag hydroxide-simeth (MAALOX/MYLANTA) 200-200-20 MG/5ML suspension 30 mL  30 mL Oral Q6H PRN Sherwood Gambler, MD      . cloNIDine (CATAPRES) tablet 0.1 mg  0.1 mg Oral QID Sherwood Gambler, MD   0.1 mg at 11/13/20 1007   Followed by  . [START ON 11/14/2020] cloNIDine (CATAPRES) tablet 0.1 mg  0.1 mg Oral BID Sherwood Gambler, MD       Followed by  . [START ON 11/17/2020] cloNIDine (CATAPRES) tablet 0.1 mg  0.1 mg Oral Daily Sherwood Gambler, MD      . dicyclomine (BENTYL) tablet 20 mg  20 mg Oral Q6H PRN Sherwood Gambler, MD      . hydrOXYzine (ATARAX/VISTARIL) tablet 25 mg  25 mg Oral Q6H PRN Sherwood Gambler, MD      . loperamide (IMODIUM) capsule 2-4 mg  2-4 mg Oral PRN Sherwood Gambler, MD      . methocarbamol (ROBAXIN) tablet 500 mg  500 mg Oral Q8H PRN Sherwood Gambler, MD      . mirtazapine (REMERON) tablet 15 mg  15 mg Oral QHS Sherwood Gambler, MD   15 mg at 11/12/20 2130  . naproxen (NAPROSYN) tablet 500 mg  500 mg Oral BID PRN Sherwood Gambler, MD   500 mg at 11/13/20 0134  . nicotine (NICODERM CQ - dosed in mg/24 hours) patch 21 mg  21 mg Transdermal Daily Sherwood Gambler, MD      . ondansetron (ZOFRAN-ODT) disintegrating tablet 4 mg  4 mg Oral Q6H PRN Sherwood Gambler, MD      . QUEtiapine (SEROQUEL) tablet 400 mg  400 mg Oral QHS Sherwood Gambler, MD   400 mg at 11/12/20 2131   Current Outpatient Medications  Medication Sig Dispense Refill  . hydrOXYzine (ATARAX/VISTARIL) 25 MG tablet Take 1 tablet (25 mg total) by mouth every 6  (six) hours as needed for anxiety. (Patient not taking: Reported on 11/13/2020) 30 tablet 0  . mirtazapine (REMERON) 15 MG tablet Take 1 tablet (15 mg total) by mouth at bedtime. (Patient not taking: Reported on 11/13/2020) 30 tablet 0  . QUEtiapine (SEROQUEL) 400 MG tablet Take 1 tablet (400 mg total) by mouth at bedtime. (Patient not taking: Reported on 11/13/2020) 30 tablet 0    Musculoskeletal: Unable to assess via video call Psychiatric Specialty Exam: Physical Exam HENT:     Head: Normocephalic.     Nose: Nose normal.  Eyes:     Pupils: Pupils are equal, round, and reactive to light.  Cardiovascular:     Rate and Rhythm: Normal rate.  Pulmonary:     Effort: Pulmonary effort is normal.  Musculoskeletal:        General: Normal range of motion.  Cervical back: Normal range of motion.  Neurological:     General: No focal deficit present.     Mental Status: She is alert and oriented to person, place, and time.  Psychiatric:        Mood and Affect: Mood is depressed. Affect is tearful.        Behavior: Behavior is slowed and withdrawn. Behavior is cooperative.        Thought Content: Thought content includes suicidal ideation. Thought content includes suicidal plan.        Cognition and Memory: Memory normal.        Judgment: Judgment is impulsive.     Review of Systems  Blood pressure 115/74, pulse (!) 110, temperature 98.9 F (37.2 C), temperature source Oral, resp. rate 14, SpO2 100 %, unknown if currently breastfeeding.There is no height or weight on file to calculate BMI.  General Appearance: Fairly Groomed and Guarded  Eye Contact:  Minimal  Speech:  Clear and Coherent and Slow  Volume:  Decreased  Mood:  Depressed, Hopeless and Worthless  Affect:  Congruent, Depressed and Tearful  Thought Process:  Coherent, Goal Directed and Descriptions of Associations: Intact  Orientation:  Full (Time, Place, and Person)  Thought Content:  Illogical and Paranoid Ideation,  ruminates on drug problem and wanting to kill herself  Suicidal Thoughts:  Yes.  with intent/plan  Homicidal Thoughts:  No  Memory:  Immediate;   Good Recent;   Good Remote;   Good  Judgement:  Impaired  Insight:  Lacking  Psychomotor Activity:  Decreased  Concentration:  Concentration: Fair and Attention Span: Fair  Recall:  Good  Fund of Knowledge:  Good  Language:  Good  Akathisia:  NA  Handed:  Right  AIMS (if indicated):     Assets:  Communication Skills Desire for Improvement Resilience  ADL's:  Impaired  Cognition:  WNL  Sleep:   >6 hours    Treatment Plan Summary: Daily contact with patient to assess and evaluate symptoms and progress in treatment and Medication management. She is Covid positive so will need location that can accommodate this.   Patient cannot contract for safety and endorses plan to overdose.  Her current drug usage that contributes to impulsivity, has limited protective factors,  Continue psychiatric medications for mood stabilization.   Disposition: Recommend psychiatric Inpatient admission when medically cleared.   Spoke with PA Joy; informed of above recommendation and disposition This service was provided via telemedicine using a 2-way, interactive audio and Radiographer, therapeutic.  Names of all persons participating in this telemedicine service and their role in this encounter. Name: [place] Role: patient  Name: Merlyn Lot Role: [place], NP 11/13/2020 10:45 AM

## 2020-11-13 NOTE — ED Provider Notes (Signed)
Emergency Medicine Observation Re-evaluation Note  Latoya [name] is a 30 y.o. female, seen on rounds today.  Pt initially presented to the ED for complaints of Suicidal Currently, the patient is awaiting placement.   Physical Exam  BP 94/69   Pulse (!) 110   Temp 99 F (37.2 C) (Oral)   Resp 18   SpO2 97%  Physical Exam General: Alert, no acute distress Cardiac: Mildly tachycardic, regular rhythm Lungs: Clear in all fields.  No increased work of breathing. Psych: Calm and cooperative.  ED Course / MDM  EKG:    I have reviewed the labs performed to date as well as medications administered while in observation.  No changes over the last 24 hours.  Plan  Current plan is for inpatient psych placement. Patient is not under full IVC at this time.  Psych reeval completed this morning.  Patient continues to be voluntary.  Recommended for inpatient.  Patient tested positive for Covid upon intake to the ED, complicating her placement.    Latoya [name] 11/13/20 1635    Luna Fuse, MD 11/14/20 [phone]

## 2020-11-13 NOTE — ED Notes (Signed)
Sitter to lunch. This tech taking over observations until his return.

## 2020-11-13 NOTE — ED Notes (Signed)
Breakfast Ordered 

## 2020-11-13 NOTE — ED Notes (Signed)
Sitter returned.

## 2020-11-14 NOTE — ED Notes (Signed)
Service Resource called @ 1803-Dinner Tray Ordered.

## 2020-11-14 NOTE — BH Assessment (Signed)
TTS attempted to see patient for re-assessment. Unable to see at this time due her nurse dealing with an emergency with another patient. TTS will be notified when able to re-assess patient.

## 2020-11-14 NOTE — ED Notes (Signed)
Lunch Tray Ordered @ 1016.

## 2020-11-14 NOTE — ED Notes (Signed)
Breakfast ordered 

## 2020-11-14 NOTE — Progress Notes (Signed)
Pt given wash cloths, towels, toothbrush, tooth paste, and comb by this NT. Pt pleasant and independent

## 2020-11-14 NOTE — BH Assessment (Incomplete)
TTS Re-assessment:   Patient originally seen 11/12/2020 for suicidal ideations triggered by substance use heroin every few days and cocaine use daily. Patient continue to report suicidal ideation with a plan to overdose on pills. When asked why do you want to end your life patient stated, "I am just tired. I am just tired of doing the same thing." When asked for her to explain she stated, "using getting help then using again." When asked have you ever been sober patient stated, "not for a long time." Denied homicidal ideations, denied auditory/visual hallucinations. When asked where have you received treatment patient stated, "I have been a few places but cannot remember there names. The last place was [facility]."   Disposition: S. Rankin, NP, continue to recommend inpatient treatment

## 2020-11-15 DIAGNOSIS — F1494 Cocaine use, unspecified with cocaine-induced mood disorder: Secondary | ICD-10-CM

## 2020-11-15 MED ORDER — FLUOXETINE HCL 10 MG PO CAPS
10.0000 mg | ORAL_CAPSULE | Freq: Every day | ORAL | Status: DC
  Administered 2020-11-15: 10 mg via ORAL
  Filled 2020-11-15: qty 1

## 2020-11-15 NOTE — Discharge Instructions (Addendum)
Try to avoid using drugs.  Follow-up with a counselor for further care and treatment of your substance abuse.  For the Covid infection, wear a mask whenever you are around anyone else, until all of your symptoms are gone and for at least 5 days.  For cough use Robitussin.  For fever or achiness take Tylenol.

## 2020-11-15 NOTE — ED Provider Notes (Signed)
Emergency Medicine Observation Re-evaluation Note  Latoya [name] is a 30 y.o. female, seen on rounds today.  Pt initially presented to the ED for complaints of Suicidal Currently, the patient is sleeping in darkened room.  Physical Exam  BP (!) 101/59 (BP Location: Right Arm)   Pulse 95   Temp 97.9 F (36.6 C) (Oral)   Resp 20   SpO2 97%  Physical Exam General: Alert, no acute distress Cardiac: Mildly tachycardic, regular rhythm Lungs: Clear in all fields.  No increased work of breathing. Psych: Calm and cooperative.  ED Course / MDM  EKG:    I have reviewed the labs performed to date as well as medications administered while in observation.  Recent changes in the last 24 hours include N/A.  Plan  Current plan is for inpatient treatment. Patient is not under full IVC at this time.   Eustaquio Maize, PA-C 11/15/20 0871    Daleen Bo, MD 11/17/20 [phone]

## 2020-11-15 NOTE — ED Notes (Signed)
Vol./SI/Inpt tx Breakfast Orders Placed

## 2020-11-15 NOTE — Patient Outreach (Signed)
[facility] was able to visit with Pt before being discharged. [facility] issued contact information for Pt for community assistance.

## 2020-11-15 NOTE — Consult Note (Signed)
Telepsych Consultation   Reason for Consult:   Referring Physician:  EPD Location of Patient:  Location of Provider: [facility]  Patient Identification: Latoya [name] MRN:  [id number] Principal Diagnosis: Psychoactive substance-induced mood disorder ([place]) Diagnosis:  Principal Problem:   Psychoactive substance-induced mood disorder ([place]) Active Problems:   Bipolar and related disorder due to another medical condition with mixed features   Total Time spent with patient: 15 minutes  Subjective:   Latoya [name] is a 30 y.o. female was seen via tele-assessement. Reported " I am not feeling well I have Covid" patient reported she is suicidal due to substance use/abuse.  " I cant keep living like this." Patient stated that she has been off her medications and would like to be restarted on Prozac.  Chart reviewed will restart Prozac and continue Seroquel 400 mg and Remeron 15 mg patient to be reassessed in the morning.Case was staffed with MD Dwyane Dee. Support, encouragement  and reassurance was provided.    HPI: per admission assessment note: Latoya [name] has a significant history for polysubstance use disorder, currently presents with substance induced mood disorder, plans to overdose on drugs if discharged.  She presents very depressed, hopeless, withdrawn  and makes minimal eye contact with this Probation officer during assessment.  She appears remorseful and embarassed regarding her continued drug usage.  Tells me she is homeless, but is able to stay with the "man who does drugs with me."  States it is hard to not use drugs when she is exposed to it everyday.   Of note, she states she was incarcerated last month when he trespassed her from is house.  Reports limitied familial support, her mother does lives locally but "supports me when I am doing the right thing and not using drugs."  She tells me she feels powerless to stop using, and wants to kill herself because of this.   Engaged in empathetic listening, positive regard offered as well as sobering conversation regarding adherence to mental health treatment for therapy and med mgmt to help with addiction  Past Psychiatric History:   Risk to Self:   Risk to Others:   Prior Inpatient Therapy:   Prior Outpatient Therapy:    Past Medical History:  Past Medical History:  Diagnosis Date  . Anemia   . Anxiety   . Arthritis   . Blood transfusion without reported diagnosis   . Crohn's disease ([place])   . Depression   . Drug-seeking behavior   . Gestational diabetes   . History of substance abuse ([place])   . HSV infection   . Pregnancy induced hypertension   . Substance abuse [facility])     Past Surgical History:  Procedure Laterality Date  . CHOLECYSTECTOMY    . COLONOSCOPY N/A 07/07/2017   Procedure: COLONOSCOPY;  Surgeon: Juanita Craver, MD;  Location: [facility] ENDOSCOPY;  Service: Endoscopy;  Laterality: N/A;  . DEEP NECK LYMPH NODE BIOPSY / EXCISION  2011   Family History:  Family History  Problem Relation Age of Onset  . Diabetes Mother   . Hypertension Mother   . Hypertension Father   . Stroke Maternal Grandfather   . Colon cancer Neg Hx   . Stomach cancer Neg Hx    Family Psychiatric  History: Social History:  Social History   Substance and Sexual Activity  Alcohol Use Not Currently     Social History   Substance and Sexual Activity  Drug Use Yes  . Types: "Crack" cocaine, Cocaine  Comment: cocai, former user, 06/27/2017    Social History   Socioeconomic History  . Marital status: Single    Spouse name: Not on file  . Number of children: 2  . Years of education: Not on file  . Highest education level: Not on file  Occupational History  . Not on file  Tobacco Use  . Smoking status: Never Smoker  . Smokeless tobacco: Never Used  Vaping Use  . Vaping Use: Never used  Substance and Sexual Activity  . Alcohol use: Not Currently  . Drug use: Yes    Types: "Crack" cocaine, Cocaine     Comment: cocai, former user, 06/27/2017  . Sexual activity: Not Currently    Partners: Male    Birth control/protection: None  Other Topics Concern  . Not on file  Social History Narrative  . Not on file   Social Determinants of Health   Financial Resource Strain: Not on file  Food Insecurity: Not on file  Transportation Needs: Not on file  Physical Activity: Not on file  Stress: Not on file  Social Connections: Not on file   Additional Social History:    Allergies:  No Known Allergies  Labs: No results found for this or any previous visit (from the past 48 hour(s)).  Medications:  Current Facility-Administered Medications  Medication Dose Route Frequency Provider Last Rate Last Admin  . acetaminophen (TYLENOL) tablet 650 mg  650 mg Oral Q6H PRN Sherwood Gambler, MD   650 mg at 11/15/20 0820  . alum & mag hydroxide-simeth (MAALOX/MYLANTA) 200-200-20 MG/5ML suspension 30 mL  30 mL Oral Q6H PRN Sherwood Gambler, MD      . cloNIDine (CATAPRES) tablet 0.1 mg  0.1 mg Oral BID Sherwood Gambler, MD   0.1 mg at 11/15/20 1036   Followed by  . [START ON 11/17/2020] cloNIDine (CATAPRES) tablet 0.1 mg  0.1 mg Oral Daily Sherwood Gambler, MD      . dicyclomine (BENTYL) tablet 20 mg  20 mg Oral Q6H PRN Sherwood Gambler, MD      . hydrOXYzine (ATARAX/VISTARIL) tablet 25 mg  25 mg Oral Q6H PRN Sherwood Gambler, MD   25 mg at 11/14/20 2050  . loperamide (IMODIUM) capsule 2-4 mg  2-4 mg Oral PRN Sherwood Gambler, MD      . methocarbamol (ROBAXIN) tablet 500 mg  500 mg Oral Q8H PRN Sherwood Gambler, MD      . mirtazapine (REMERON) tablet 15 mg  15 mg Oral QHS Sherwood Gambler, MD   15 mg at 11/14/20 2050  . naproxen (NAPROSYN) tablet 500 mg  500 mg Oral BID PRN Sherwood Gambler, MD   500 mg at 11/13/20 0134  . nicotine (NICODERM CQ - dosed in mg/24 hours) patch 21 mg  21 mg Transdermal Daily Sherwood Gambler, MD      . ondansetron (ZOFRAN-ODT) disintegrating tablet 4 mg  4 mg Oral Q6H PRN Sherwood Gambler, MD       . QUEtiapine (SEROQUEL) tablet 400 mg  400 mg Oral QHS Sherwood Gambler, MD   400 mg at 11/14/20 2050   Current Outpatient Medications  Medication Sig Dispense Refill  . hydrOXYzine (ATARAX/VISTARIL) 25 MG tablet Take 1 tablet (25 mg total) by mouth every 6 (six) hours as needed for anxiety. (Patient not taking: Reported on 11/13/2020) 30 tablet 0  . mirtazapine (REMERON) 15 MG tablet Take 1 tablet (15 mg total) by mouth at bedtime. (Patient not taking: Reported on 11/13/2020) 30 tablet 0  . QUEtiapine (SEROQUEL)  400 MG tablet Take 1 tablet (400 mg total) by mouth at bedtime. (Patient not taking: Reported on 11/13/2020) 30 tablet 0    Musculoskeletal: Strength & Muscle Tone: within normal limits Gait & Station: N/A Patient leans: N/A  Psychiatric Specialty Exam: Physical Exam Vitals reviewed.  Neurological:     Mental Status: She is alert.  Psychiatric:        Mood and Affect: Mood normal.        Thought Content: Thought content normal.     Review of Systems  Psychiatric/Behavioral: Negative for suicidal ideas. The patient is not nervous/anxious.   All other systems reviewed and are negative.   Blood pressure 121/62, pulse 95, temperature 97.9 F (36.6 C), temperature source Oral, resp. rate 20, SpO2 97 %, unknown if currently breastfeeding.There is no height or weight on file to calculate BMI.  General Appearance: Casual paper scrubs  Eye Contact:  Minimal  Speech:  Clear and Coherent  Volume:  Normal  Mood:  Anxious and Depressed  Affect:  Congruent  Thought Process:  Coherent  Orientation:  Full (Time, Place, and Person)  Thought Content:  Logical  Suicidal Thoughts:  Yes.  without intent/plan  Homicidal Thoughts:  No  Memory:  Immediate;   Fair Recent;   Fair  Judgement:  Fair  Insight:  Fair  Psychomotor Activity:  Normal  Concentration:  Concentration: Fair  Recall:  [employer] of Knowledge:  Fair  Language:  Good  Akathisia:  No  Handed:  Right  AIMS (if  indicated):     Assets:  Communication Skills Desire for Improvement Resilience Social Support  ADL's:  Intact  Cognition:  WNL  Sleep:       Restarted home medications - Orders placed for peer support - Overnight observation   Disposition: Patient does not meet criteria for psychiatric inpatient admission. Supportive therapy provided about ongoing stressors. Refer to IOP. Discussed crisis plan, support from social network, calling 911, coming to the Emergency Department, and calling Suicide Hotline.    This service was provided via telemedicine using a 2-way, interactive audio and video technology.  Names of all persons participating in this telemedicine service and their role in this encounter. Name: Latoya [name]  Role: patient   Name: T. Revonda Menter  Role: NP           Derrill Center, NP 11/15/2020 11:19 AM

## 2020-11-15 NOTE — ED Notes (Signed)
Patient was given Snack and Drink.

## 2021-01-08 ENCOUNTER — Emergency Department (HOSPITAL_COMMUNITY)
Admission: EM | Admit: 2021-01-08 | Discharge: 2021-01-08 | Disposition: A | Discharge status: Other Acute Inpatient Hospital | Payer: Medicaid Other | Source: Home / Self Care | Attending: Emergency Medicine | Admitting: Emergency Medicine

## 2021-01-08 ENCOUNTER — Other Ambulatory Visit: Payer: Self-pay

## 2021-01-08 ENCOUNTER — Encounter (HOSPITAL_COMMUNITY): Payer: Self-pay | Admitting: Emergency Medicine

## 2021-01-08 ENCOUNTER — Ambulatory Visit (HOSPITAL_COMMUNITY)
Admission: EM | Admit: 2021-01-08 | Discharge: 2021-01-09 | Disposition: A | Discharge status: Home / Self Care | Payer: Medicaid Other | Source: Home / Self Care

## 2021-01-08 ENCOUNTER — Emergency Department (HOSPITAL_COMMUNITY)
Admission: EM | Admit: 2021-01-08 | Discharge: 2021-01-08 | Disposition: A | Discharge status: Home / Self Care | Payer: Medicaid Other | Attending: Emergency Medicine | Admitting: Emergency Medicine

## 2021-01-08 DIAGNOSIS — R45851 Suicidal ideations: Secondary | ICD-10-CM

## 2021-01-08 DIAGNOSIS — Z20822 Contact with and (suspected) exposure to covid-19: Secondary | ICD-10-CM | POA: Insufficient documentation

## 2021-01-08 DIAGNOSIS — X58XXXA Exposure to other specified factors, initial encounter: Secondary | ICD-10-CM | POA: Diagnosis not present

## 2021-01-08 DIAGNOSIS — Z23 Encounter for immunization: Secondary | ICD-10-CM | POA: Insufficient documentation

## 2021-01-08 DIAGNOSIS — F1910 Other psychoactive substance abuse, uncomplicated: Secondary | ICD-10-CM | POA: Insufficient documentation

## 2021-01-08 DIAGNOSIS — F332 Major depressive disorder, recurrent severe without psychotic features: Secondary | ICD-10-CM | POA: Insufficient documentation

## 2021-01-08 DIAGNOSIS — S0502XA Injury of conjunctiva and corneal abrasion without foreign body, left eye, initial encounter: Secondary | ICD-10-CM | POA: Diagnosis not present

## 2021-01-08 DIAGNOSIS — S0592XA Unspecified injury of left eye and orbit, initial encounter: Secondary | ICD-10-CM | POA: Diagnosis present

## 2021-01-08 DIAGNOSIS — F1010 Alcohol abuse, uncomplicated: Secondary | ICD-10-CM | POA: Insufficient documentation

## 2021-01-08 DIAGNOSIS — F339 Major depressive disorder, recurrent, unspecified: Secondary | ICD-10-CM

## 2021-01-08 LAB — COMPREHENSIVE METABOLIC PANEL
ALT: 14 U/L (ref 0–44)
AST: 15 U/L (ref 15–41)
Albumin: 4.1 g/dL (ref 3.5–5.0)
Alkaline Phosphatase: 51 U/L (ref 38–126)
Anion gap: 9 (ref 5–15)
BUN: 9 mg/dL (ref 6–20)
CO2: 22 mmol/L (ref 22–32)
Calcium: 9.3 mg/dL (ref 8.9–10.3)
Chloride: 107 mmol/L (ref 98–111)
Creatinine, Ser: 0.69 mg/dL (ref 0.44–1.00)
GFR, Estimated: >60 mL/min (ref >60)
Glucose, Bld: 103 mg/dL — ABNORMAL HIGH (ref 70–99)
Potassium: 3.4 mmol/L — ABNORMAL LOW (ref 3.5–5.1)
Sodium: 138 mmol/L (ref 135–145)
Total Bilirubin: 0.3 mg/dL (ref 0.3–1.2)
Total Protein: 7.4 g/dL (ref 6.5–8.1)

## 2021-01-08 LAB — I-STAT BETA HCG BLOOD, ED (MC, WL, AP ONLY): I-stat hCG, quantitative: <5 m[IU]/mL (ref <5)

## 2021-01-08 LAB — ACETAMINOPHEN LEVEL: Acetaminophen (Tylenol), Serum: <10 ug/mL — ABNORMAL LOW (ref 10–30)

## 2021-01-08 LAB — CBC
HCT: 35.8 % — ABNORMAL LOW (ref 36.0–46.0)
Hemoglobin: 11.2 g/dL — ABNORMAL LOW (ref 12.0–15.0)
MCH: 25.9 pg — ABNORMAL LOW (ref 26.0–34.0)
MCHC: 31.3 g/dL (ref 30.0–36.0)
MCV: 82.9 fL (ref 80.0–100.0)
Platelets: 374 10*3/uL (ref 150–400)
RBC: 4.32 MIL/uL (ref 3.87–5.11)
RDW: 13.4 % (ref 11.5–15.5)
WBC: 6.7 10*3/uL (ref 4.0–10.5)
nRBC: 0 % (ref 0.0–0.2)

## 2021-01-08 LAB — SALICYLATE LEVEL: Salicylate Lvl: <7 mg/dL — ABNORMAL LOW (ref 7.0–30.0)

## 2021-01-08 LAB — RESP PANEL BY RT-PCR (FLU A&B, COVID) ARPGX2
Influenza A by PCR: NEGATIVE
Influenza B by PCR: NEGATIVE
SARS Coronavirus 2 by RT PCR: NEGATIVE

## 2021-01-08 LAB — ETHANOL: Alcohol, Ethyl (B): <10 mg/dL (ref <10)

## 2021-01-08 MED ORDER — ALUM & MAG HYDROXIDE-SIMETH 200-200-20 MG/5ML PO SUSP: 30.0000 mL | ORAL | Status: DC | PRN

## 2021-01-08 MED ORDER — ACETAMINOPHEN 325 MG PO TABS: 650.0000 mg | ORAL_TABLET | Freq: Four times a day (QID) | ORAL | Status: DC | PRN

## 2021-01-08 MED ORDER — TRAZODONE HCL 50 MG PO TABS: 50.0000 mg | ORAL_TABLET | Freq: Every evening | ORAL | Status: DC | PRN

## 2021-01-08 MED ORDER — HYDROXYZINE HCL 25 MG PO TABS: 25.0000 mg | ORAL_TABLET | Freq: Three times a day (TID) | ORAL | Status: DC | PRN

## 2021-01-08 MED ORDER — ERYTHROMYCIN 5 MG/GM OP OINT: TOPICAL_OINTMENT | OPHTHALMIC | 0 refills | Status: AC

## 2021-01-08 MED ORDER — TETRACAINE HCL 0.5 % OP SOLN
2.0000 [drp] | Freq: Once | OPHTHALMIC | Status: AC
  Administered 2021-01-08: 2 [drp] via OPHTHALMIC
  Filled 2021-01-08: qty 4

## 2021-01-08 MED ORDER — FLUORESCEIN SODIUM 1 MG OP STRP
1.0000 | ORAL_STRIP | Freq: Once | OPHTHALMIC | Status: AC
  Administered 2021-01-08: 1 via OPHTHALMIC
  Filled 2021-01-08: qty 1

## 2021-01-08 MED ORDER — MAGNESIUM HYDROXIDE 400 MG/5ML PO SUSP: 30.0000 mL | Freq: Every day | ORAL | Status: DC | PRN

## 2021-01-08 MED ORDER — TETANUS-DIPHTH-ACELL PERTUSSIS 5-2.5-18.5 LF-MCG/0.5 IM SUSY
0.5000 mL | PREFILLED_SYRINGE | Freq: Once | INTRAMUSCULAR | Status: AC
  Administered 2021-01-08: 0.5 mL via INTRAMUSCULAR
  Filled 2021-01-08: qty 0.5

## 2021-01-08 NOTE — ED Provider Notes (Signed)
MSE was initiated and I personally evaluated the patient and placed orders (if any) at  2:12 AM on January 08, 2021.  The patient appears stable so that the remainder of the MSE may be completed by another provider.  HPI:  Latoya [name] is a 30 y.o. female presents to the emergency department with complaints of left eye pain.  Reports she thinks she has something in it but is not sure.  Does not wear contacts.  Does wear glasses.  Cannot tell me what happened.  ROS:  Positive: Left eye pain, left eye redness, clear discharge Negative: Headache, neck pain, fevers, chills  PE:  BP 119/76 (BP Location: Left Arm)   Pulse 92   Temp 98.6 F (37 C) (Oral)   Resp 16   Ht 5' 10"  (1.778 m)   Wt 84.4 kg   SpO2 97%   BMI 26.69 kg/m   General: Awake, no distress  HEENT: Atraumatic; conjunctival injection with some thick discharge, full EOMs.   Resp: Normal effort  Cardiac: normal rate Abd: Nondistended MSK: moves all extremities without difficulty Neuro: speech clear   MDM: MSE completed and orders placed as needed.    Discussed with the patient that exiting the department prior to completion of the work-up is AMA and there is no guarantee that there are no emergency medical conditions present. Pt states understanding.      Latoya [name], Gwenlyn Perking 01/08/21 6283    Maudie Flakes, MD 01/08/21 0730

## 2021-01-08 NOTE — ED Triage Notes (Signed)
Patient presents via EMS. Patient states she saw a brown fleck in her eye and attempted to get it out. Patient states she smoked crack and snorted heroin prior to arrival also.

## 2021-01-08 NOTE — ED Provider Notes (Signed)
[facility] DEPT Provider Note   CSN: [id number] Arrival date & time: 01/08/21  0125     History Chief Complaint  Patient presents with  . Eye Problem    Latoya [name] is a 30 y.o. female.  The history is provided by the patient and medical records. No language interpreter was used.  Eye Problem Associated symptoms: redness   Associated symptoms: no headaches      30 year old female with history of Crohn's disease, who presents with left eye irritation.  Patient report yesterday she felt something in her eye, she tries to rub it off and remove it without any relief.  She endorsed worsening left eye pain and irritation since.  She does not wear contact lenses or prescription glasses.  She denies any diplopia or loss of vision.  At this time she does not endorse foreign body sensation.  Denies pain with eye movement.  No complaint of headache, neck pain, fever or chills. Denies eye itching or significant discharge.       Past Medical History:  Diagnosis Date  . Anemia   . Anxiety   . Arthritis   . Blood transfusion without reported diagnosis   . Crohn's disease ([place])   . Depression   . Drug-seeking behavior   . Gestational diabetes   . History of substance abuse ([place])   . HSV infection   . Pregnancy induced hypertension   . Substance abuse [facility])     Patient Active Problem List   Diagnosis Date Noted  . Psychoactive substance-induced mood disorder ([place]) 11/13/2020  . Suicidal ideation   . Bipolar I disorder, most recent episode mixed ([place]) 10/23/2020  . Depression with suicidal ideation 10/22/2020  . Heroin abuse ([place]) 07/10/2020  . Bipolar and related disorder due to another medical condition with mixed features 07/10/2020  . MDD (major depressive disorder) 07/06/2020  . Post partum depression 07/12/2017  . IBD (inflammatory bowel disease)   . Acute pancreatitis   . Iron deficiency anemia due to chronic blood loss   . Heme  positive stool   . Pancolitis ([place]) 07/04/2017  . Generalized abdominal pain   . Diarrhea   . Major depressive disorder, recurrent severe without psychotic features ([place]) 06/29/2017  . Cocaine abuse ([place]) 06/29/2017  . SVD (spontaneous vaginal delivery) 05/15/2017  . Crack cocaine use 05/15/2017  . Cervical dysplasia 01/16/2017  . Obesity (BMI 30.0-34.9) 01/12/2017  . History of substance abuse ([place]) 01/12/2017  . History of gestational diabetes mellitus (GDM) 01/12/2017  . History of gestational hypertension 01/12/2017  . History of macrosomia in infant in prior pregnancy, currently pregnant 01/12/2017  . Short interval between pregnancies affecting pregnancy in second trimester, antepartum 01/12/2017  . Late prenatal care 01/12/2017  . Paxil use in early pregnancy 01/12/2017  . History of suicide attempt 01/12/2017  . Anxiety and depression 01/12/2017  . Supervision of high risk pregnancy, antepartum 01/10/2017  . GDM (gestational diabetes mellitus) 02/25/2016  . Obesity in pregnancy 02/25/2016  . Anemia 02/08/2016    Past Surgical History:  Procedure Laterality Date  . CHOLECYSTECTOMY    . COLONOSCOPY N/A 07/07/2017   Procedure: COLONOSCOPY;  Surgeon: Juanita Craver, MD;  Location: [facility] ENDOSCOPY;  Service: Endoscopy;  Laterality: N/A;  . DEEP NECK LYMPH NODE BIOPSY / EXCISION  2011     OB History    Gravida  2   Para  2   Term  2   Preterm  0   AB  0   Living  2     SAB  0   IAB  0   Ectopic  0   Multiple  0   Live Births  2           Family History  Problem Relation Age of Onset  . Diabetes Mother   . Hypertension Mother   . Hypertension Father   . Stroke Maternal Grandfather   . Colon cancer Neg Hx   . Stomach cancer Neg Hx     Social History   Tobacco Use  . Smoking status: Never Smoker  . Smokeless tobacco: Never Used  Vaping Use  . Vaping Use: Never used  Substance Use Topics  . Alcohol use: Not Currently  . Drug use: Yes     Types: "Crack" cocaine, Cocaine    Comment: cocai, former user, 06/27/2017    Home Medications Prior to Admission medications   Medication Sig Start Date End Date Taking? Authorizing Provider  hydrOXYzine (ATARAX/VISTARIL) 25 MG tablet Take 1 tablet (25 mg total) by mouth every 6 (six) hours as needed for anxiety. Patient not taking: Reported on 11/13/2020 10/25/20   Sharma Covert, MD  mirtazapine (REMERON) 15 MG tablet Take 1 tablet (15 mg total) by mouth at bedtime. Patient not taking: Reported on 11/13/2020 10/25/20   Sharma Covert, MD  QUEtiapine (SEROQUEL) 400 MG tablet Take 1 tablet (400 mg total) by mouth at bedtime. Patient not taking: Reported on 11/13/2020 10/25/20   Sharma Covert, MD  hyoscyamine (LEVSIN SL) 0.125 MG SL tablet Place 1 tablet (0.125 mg total) every 4 (four) hours as needed under the tongue. Patient not taking: Reported on 07/03/2018 08/03/17 07/09/19  Levin Erp, PA  mesalamine (APRISO) 0.375 g 24 hr capsule Take 4 capsules (1.5 g total) by mouth daily. Patient not taking: Reported on 07/03/2018 11/26/17 07/09/19  Mauri Pole, MD  omeprazole (PRILOSEC) 40 MG capsule TAKE 1 CAPSULE (40 MG TOTAL) DAILY BY MOUTH. Patient not taking: Reported on 07/03/2018 11/19/17 07/09/19  Mauri Pole, MD  PARoxetine (PAXIL-CR) 25 MG 24 hr tablet Take 1 tablet (25 mg total) by mouth daily. Patient not taking: Reported on 07/03/2018 05/16/17 07/09/19  Emily Filbert, MD    Allergies    Patient has no known allergies.  Review of Systems   Review of Systems  Constitutional: Negative for fever.  Eyes: Positive for pain and redness.  Neurological: Negative for headaches.    Physical Exam Updated Vital Signs BP 133/80 (BP Location: Left Arm)   Pulse 76   Temp 98.6 F (37 C) (Oral)   Resp 17   Ht 5' 10"  (1.778 m)   Wt 84.4 kg   SpO2 100%   BMI 26.69 kg/m   Physical Exam Vitals and nursing note reviewed.  Constitutional:      General: She is  not in acute distress.    Appearance: She is well-developed.  HENT:     Head: Atraumatic.  Eyes:     General: Lids are normal. Lids are everted, no foreign bodies appreciated. Vision grossly intact. Gaze aligned appropriately.     Intraocular pressure: Left eye pressure is 13 mmHg. Measurements were taken using a handheld tonometer.    Extraocular Movements: Extraocular movements intact.     Right eye: Normal extraocular motion.     Left eye: Normal extraocular motion.     Conjunctiva/sclera:     Right eye: Right conjunctiva is not injected. No chemosis, exudate  or hemorrhage.    Left eye: Left conjunctiva is injected. Hemorrhage present. No chemosis or exudate.    Pupils: Pupils are equal, round, and reactive to light.     Left eye: Pupil is round, reactive and not sluggish. Corneal abrasion and fluorescein uptake present. Seidel exam negative.    Slit lamp exam:    Left eye: No corneal flare, corneal ulcer or foreign body.  Musculoskeletal:     Cervical back: Neck supple.  Skin:    Findings: No rash.  Neurological:     Mental Status: She is alert.     ED Results / Procedures / Treatments   Labs (all labs ordered are listed, but only abnormal results are displayed) Labs Reviewed - No data to display  EKG None  Radiology No results found.  Procedures Procedures   Medications Ordered in ED Medications  tetracaine (PONTOCAINE) 0.5 % ophthalmic solution 2 drop (has no administration in time range)  fluorescein ophthalmic strip 1 strip (has no administration in time range)  Tdap (BOOSTRIX) injection 0.5 mL (has no administration in time range)    ED Course  I have reviewed the triage vital signs and the nursing notes.  Pertinent labs & imaging results that were available during my care of the patient were reviewed by me and considered in my medical decision making (see chart for details).    MDM Rules/Calculators/A&P                          BP (!) 140/99   Pulse  78   Temp 98.6 F (37 C) (Oral)   Resp 18   Ht 5' 10"  (1.778 m)   Wt 84.4 kg   SpO2 100%   BMI 26.69 kg/m   Final Clinical Impression(s) / ED Diagnoses Final diagnoses:  Abrasion of left cornea, initial encounter    Rx / DC Orders ED Discharge Orders         Ordered    erythromycin ophthalmic ointment        01/08/21 1227         12:05 PM Patient report having left eye irritation yesterday and was trying to rub it and now she is having increasing pain.  Exam reveals a very injected but limbic sparing left conjunctiva.  Fluorescein stain uptake noted consistent with corneal abrasion.  Normal intraocular pressure.  Negative Seidel sign.  No foreign body noted.  No vision changes.  We will update tetanus, will prescribe antibiotic eye ointment as treatment.  Doubt closed angle glaucoma, uveitis or iritis   Domenic Moras, PA-C 01/08/21 1230    Arnaldo Natal, MD 01/08/21 1620

## 2021-01-08 NOTE — ED Notes (Signed)
Pt refused to answer NP questions. She was in rush to get back to unit to call her boyfriend

## 2021-01-08 NOTE — ED Notes (Signed)
Patient has been wanded by security.

## 2021-01-08 NOTE — ED Notes (Signed)
Pt A&O x 4, [facility] transfer, pt presents with suicidal ideations, plan to overdose on medications she has at home.  Denies HI or AVH.  Pt very irritable and guarded during assessment with NP Ene Ajibola.  Skin search completed.  Monitoring for safety.  Pt resting at present.

## 2021-01-08 NOTE — ED Triage Notes (Signed)
Patient reports she is feeling suicidal and "going through some things". Patient does not wish to disclose them to Probation officer. Patient states she has no specific plan but does have intent and has attempted suicide in past.

## 2021-01-08 NOTE — ED Notes (Addendum)
Patient changed into burgundy scrubs. Belongings (hoodie, pants, shirt, socks, black sneakers) placed into bag and stored behind triage nurse's station.

## 2021-01-08 NOTE — Progress Notes (Addendum)
Spoke with patient's EDP (Martinique Robinson, PA-C) regarding the patient's current status. Per Viviana Simpler, LCAS 01/08/21 it was recommended by Oneida Alar, NP that patient be reviewed for potential transfer to [facility] for continuous observation/assessment. Reviewed patient's chart/history myself and reviewed the patient with Leandro Reasoner, NP via phone, who agreed for patient to be transferred from [facility] ED to [facility] for continuous observation/assessment. Patient to be transferred from [facility] ED to [facility] for continuous observation/assessment for further stabilization and treatment. Martinique Robinson, PA-C notified of this disposition/plan and Martinique Robinson, PA-C verbalizes understanding and agreement of this plan. Report given by myself to [place] staff via phone regarding the patient.

## 2021-01-08 NOTE — ED Notes (Signed)
Patient informed that urine specimen has been ordered, stated that she will inform staff when she is able to provide one when she is able.

## 2021-01-08 NOTE — ED Provider Notes (Signed)
[place] DEPT Provider Note   CSN: [id number] Arrival date & time: 01/08/21  1310     History Chief Complaint  Patient presents with  . Suicidal    Latoya [name] is a 30 y.o. female past medical history of Crohn's disease, polysubstance abuse, depression, anxiety, bipolar 1 disorder, presenting to the ED with complaint of depression and suicidal ideation.  She states she smokes crack cocaine and snorts heroin regularly.  This is caused her to have a chronic depression.  Over the last 2 weeks she has been feeling more down and having thoughts of ending her life by overdose.  She states she can do this anytime without difficulty.  Denies HI.  Denies medical complaints.  Of note, patient was seen in the ED a few hours prior to arrival today for separate complaint where she was diagnosed with left corneal abrasion.  The history is provided by the patient and medical records.       Past Medical History:  Diagnosis Date  . Anemia   . Anxiety   . Arthritis   . Blood transfusion without reported diagnosis   . Crohn's disease ([place])   . Depression   . Drug-seeking behavior   . Gestational diabetes   . History of substance abuse ([place])   . HSV infection   . Pregnancy induced hypertension   . Substance abuse [facility])     Patient Active Problem List   Diagnosis Date Noted  . Psychoactive substance-induced mood disorder ([place]) 11/13/2020  . Suicidal ideation   . Bipolar I disorder, most recent episode mixed ([place]) 10/23/2020  . Depression with suicidal ideation 10/22/2020  . Heroin abuse ([place]) 07/10/2020  . Bipolar and related disorder due to another medical condition with mixed features 07/10/2020  . MDD (major depressive disorder) 07/06/2020  . Post partum depression 07/12/2017  . IBD (inflammatory bowel disease)   . Acute pancreatitis   . Iron deficiency anemia due to chronic blood loss   . Heme positive stool   . Pancolitis ([place]) 07/04/2017   . Generalized abdominal pain   . Diarrhea   . Major depressive disorder, recurrent severe without psychotic features ([place]) 06/29/2017  . Cocaine abuse ([place]) 06/29/2017  . SVD (spontaneous vaginal delivery) 05/15/2017  . Crack cocaine use 05/15/2017  . Cervical dysplasia 01/16/2017  . Obesity (BMI 30.0-34.9) 01/12/2017  . History of substance abuse ([place]) 01/12/2017  . History of gestational diabetes mellitus (GDM) 01/12/2017  . History of gestational hypertension 01/12/2017  . History of macrosomia in infant in prior pregnancy, currently pregnant 01/12/2017  . Short interval between pregnancies affecting pregnancy in second trimester, antepartum 01/12/2017  . Late prenatal care 01/12/2017  . Paxil use in early pregnancy 01/12/2017  . History of suicide attempt 01/12/2017  . Anxiety and depression 01/12/2017  . Supervision of high risk pregnancy, antepartum 01/10/2017  . GDM (gestational diabetes mellitus) 02/25/2016  . Obesity in pregnancy 02/25/2016  . Anemia 02/08/2016    Past Surgical History:  Procedure Laterality Date  . CHOLECYSTECTOMY    . COLONOSCOPY N/A 07/07/2017   Procedure: COLONOSCOPY;  Surgeon: Juanita Craver, MD;  Location: [facility] ENDOSCOPY;  Service: Endoscopy;  Laterality: N/A;  . DEEP NECK LYMPH NODE BIOPSY / EXCISION  2011     OB History    Gravida  2   Para  2   Term  2   Preterm  0   AB  0   Living  2     SAB  0   IAB  0   Ectopic  0   Multiple  0   Live Births  2           Family History  Problem Relation Age of Onset  . Diabetes Mother   . Hypertension Mother   . Hypertension Father   . Stroke Maternal Grandfather   . Colon cancer Neg Hx   . Stomach cancer Neg Hx     Social History   Tobacco Use  . Smoking status: Never Smoker  . Smokeless tobacco: Never Used  Vaping Use  . Vaping Use: Never used  Substance Use Topics  . Alcohol use: Not Currently  . Drug use: Yes    Types: "Crack" cocaine, Cocaine    Comment: cocai,  former user, 06/27/2017    Home Medications Prior to Admission medications   Medication Sig Start Date End Date Taking? Authorizing Provider  erythromycin ophthalmic ointment Place a 1/2 inch ribbon of ointment into the left lower eyelid 2-4 times daily x 5 days 01/08/21   Domenic Moras, PA-C  hydrOXYzine (ATARAX/VISTARIL) 25 MG tablet Take 1 tablet (25 mg total) by mouth every 6 (six) hours as needed for anxiety. Patient not taking: Reported on 11/13/2020 10/25/20   Sharma Covert, MD  mirtazapine (REMERON) 15 MG tablet Take 1 tablet (15 mg total) by mouth at bedtime. Patient not taking: Reported on 11/13/2020 10/25/20   Sharma Covert, MD  QUEtiapine (SEROQUEL) 400 MG tablet Take 1 tablet (400 mg total) by mouth at bedtime. Patient not taking: Reported on 11/13/2020 10/25/20   Sharma Covert, MD  hyoscyamine (LEVSIN SL) 0.125 MG SL tablet Place 1 tablet (0.125 mg total) every 4 (four) hours as needed under the tongue. Patient not taking: Reported on 07/03/2018 08/03/17 07/09/19  Levin Erp, PA  mesalamine (APRISO) 0.375 g 24 hr capsule Take 4 capsules (1.5 g total) by mouth daily. Patient not taking: Reported on 07/03/2018 11/26/17 07/09/19  Mauri Pole, MD  omeprazole (PRILOSEC) 40 MG capsule TAKE 1 CAPSULE (40 MG TOTAL) DAILY BY MOUTH. Patient not taking: Reported on 07/03/2018 11/19/17 07/09/19  Mauri Pole, MD  PARoxetine (PAXIL-CR) 25 MG 24 hr tablet Take 1 tablet (25 mg total) by mouth daily. Patient not taking: Reported on 07/03/2018 05/16/17 07/09/19  Emily Filbert, MD    Allergies    Patient has no known allergies.  Review of Systems   Review of Systems  Psychiatric/Behavioral: Positive for dysphoric mood and suicidal ideas.  All other systems reviewed and are negative.   Physical Exam Updated Vital Signs BP 121/78 (BP Location: Left Arm)   Pulse 84   Temp 98.2 F (36.8 C) (Oral)   Resp 18   Ht 5' 10"  (1.778 m)   Wt 84.4 kg   LMP  (LMP Unknown)    SpO2 100%   BMI 26.69 kg/m   Physical Exam Vitals and nursing note reviewed.  Constitutional:      Appearance: She is well-developed.  HENT:     Head: Normocephalic and atraumatic.  Eyes:     Comments: Left conjunctiva is red  Cardiovascular:     Rate and Rhythm: Normal rate and regular rhythm.  Pulmonary:     Effort: Pulmonary effort is normal. No respiratory distress.     Breath sounds: Normal breath sounds.  Abdominal:     Palpations: Abdomen is soft.  Skin:    General: Skin is warm.  Neurological:     Mental  Status: She is alert.  Psychiatric:        Mood and Affect: Mood is depressed.        Speech: Speech normal.        Behavior: Behavior normal.        Thought Content: Thought content includes suicidal ideation. Thought content includes suicidal plan.     Comments: Patient resting with her eyes closed, would not make eye contact when speaking.     ED Results / Procedures / Treatments   Labs (all labs ordered are listed, but only abnormal results are displayed) Labs Reviewed  COMPREHENSIVE METABOLIC PANEL - Abnormal; Notable for the following components:      Result Value   Potassium 3.4 (*)    Glucose, Bld 103 (*)    All other components within normal limits  SALICYLATE LEVEL - Abnormal; Notable for the following components:   Salicylate Lvl <3.1 (*)    All other components within normal limits  ACETAMINOPHEN LEVEL - Abnormal; Notable for the following components:   Acetaminophen (Tylenol), Serum <10 (*)    All other components within normal limits  CBC - Abnormal; Notable for the following components:   Hemoglobin 11.2 (*)    HCT 35.8 (*)    MCH 25.9 (*)    All other components within normal limits  RESP PANEL BY RT-PCR (FLU A&B, COVID) ARPGX2  ETHANOL  RAPID URINE DRUG SCREEN, HOSP PERFORMED  I-STAT BETA HCG BLOOD, ED ([facility], [facility], [facility] ONLY)    EKG EKG Interpretation  Date/Time:  Saturday January 08 2021 16:32:25 EDT Ventricular Rate:  96 PR  Interval:  149 QRS Duration: 101 QT Interval:  353 QTC Calculation: 447 R Axis:   67 Text Interpretation: Sinus rhythm ST elev, probable normal early repol pattern 12 Lead; [place] Confirmed by Dene Gentry [phone]) on 01/08/2021 4:35:11 PM   Radiology No results found.  Procedures Procedures   Medications Ordered in ED Medications - No data to display  ED Course  I have reviewed the triage vital signs and the nursing notes.  Pertinent labs & imaging results that were available during my care of the patient were reviewed by me and considered in my medical decision making (see chart for details).    MDM Rules/Calculators/A&P                          Patient with history of anxiety, depression, bipolar disorder, polysubstance abuse, presenting with worsening depression and suicidal ideation.  States she can overdose on drugs at any time.  She regularly smokes crack cocaine and snorts heroin.  Last use was yesterday.  Medically cleared.  TTS evaluated and recommends transfer to behavior health urgent care for monitoring.  Agree with plan.  Patient remains voluntary.  Final Clinical Impression(s) / ED Diagnoses Final diagnoses:  Suicidal ideation  Polysubstance abuse [facility])    Rx / DC Orders ED Discharge Orders    None       Sophina Mitten, [place] N, PA-C 01/08/21 2006    Valarie Merino, MD 01/13/21 1025

## 2021-01-08 NOTE — ED Notes (Signed)
Pt on phone taking with family member loudly saying how she does not want to be here. Pt has hung up phone and laid down in her bed. Will continue to monitor for safety

## 2021-01-08 NOTE — ED Notes (Signed)
Patient reminded of need for urine sample

## 2021-01-08 NOTE — ED Triage Notes (Signed)
Emergency Medicine Provider Triage Evaluation Note  Latoya [name] , a 30 y.o. female  was evaluated in triage.  Pt complains of suicidal ideations.  Patient reports that she has plan to overdose on pain medication or heroin.  Patient reports that she has needs to carry out her suicide plan.  Patient reports previous suicide attempt in 2017.  Patient reports previous inpatient psychiatric hospitalization.  Patient has history of depression and bipolar disorder however reports she is not taking any medication currently.  Patient endorses heroin, crack cocaine, and alcohol use yesterday.  Review of Systems  Positive: Suicidal ideations, plan of suicide Negative: Homicidal ideations, auditory hallucinations, visual hallucinations  Physical Exam  BP (!) 162/89 (BP Location: Left Arm)   Pulse 100   Temp 98.9 F (37.2 C) (Oral)   Resp 16   Ht 5' 10"  (1.778 m)   Wt 84.4 kg   LMP  (LMP Unknown)   SpO2 98%   BMI 26.69 kg/m  Gen:   Awake, no distress   HEENT:  Atraumatic  Resp:  Normal effort  Cardiac:  Normal rate  MSK:   Moves extremities without difficulty  Neuro:  Speech clear   Medical Decision Making  Medically screening exam initiated at 2:15 PM.  Appropriate orders placed.  Latoya [name] was informed that the remainder of the evaluation will be completed by another provider, this initial triage assessment does not replace that evaluation, and the importance of remaining in the ED until their evaluation is complete.  Clinical Impression   The patient appears stable so that the remainder of the work up may be completed by another provider.     [name], [place] 01/08/21 1416

## 2021-01-08 NOTE — ED Notes (Signed)
Visual acuity: 20/400; pt only able to read top number

## 2021-01-08 NOTE — ED Provider Notes (Incomplete)
Behavioral Health Admission H&P [facility] & OBS)  Date: 01/08/21 Patient Name: Latoya [name] MRN: [id number] Chief Complaint:  Chief Complaint  Patient presents with  . Suicidal      Diagnoses:  Final diagnoses:  Episode of recurrent major depressive disorder, unspecified depression episode severity ([place])  Suicidal ideation    HPI: Latoya [name] is a 30 y/o female. Per chart review, patient  presented voluntarily to [facility] with complaint of depression and suicidal ideation with plans to overdose. Patient was transferred to [facility] for overnight observation for safety with reassessment by psychiatry tomorrow 01/09/21.   Patient was assessed face to face upon arrival to [facility]. Patient is alert and orient X 4, patient is agitated, uncooperative , and refusing to answer assessment questions. Patient states "I'm suicidal and I have drug problem. That's all I'm going to say to you." Patient refused to answer other assessment questions and becomes easily agitated when this Probation officer as well as nursing staff attempted to redirect her. Patient states "I talked to 10 people today about the same thing, i'm not going to repeat myself again"  Patient does not appear to be in any acute distress, no signs of SOB, no complaint of chest pain, vital signs stable with exception to BP of 144/74.   PHQ 2-9:  Flowsheet Row Routine Prenatal from 04/19/2017 in [place] for [facility]  Thoughts that you would be better off dead, or of hurting yourself in some way Not at all  PHQ-9 Total Score 8      [place] ED from 01/08/2021 in [place] DEPT Most recent reading at 01/08/2021  4:26 PM ED from 01/08/2021 in [place] DEPT Most recent reading at 01/08/2021  1:35 AM ED from 11/12/2020 in [place] Most recent reading at 11/15/2020  8:12 AM  C-SSRS RISK CATEGORY High Risk No Risk Error: Q7  should not be populated when Q6 is No       Total Time spent with patient: {Time; 15 min - 8 hours:17441}  Musculoskeletal  Strength & Muscle Tone: {desc; muscle tone:32375} Gait & Station: {PE GAIT ED QPRF:16384} Patient leans: {Patient Leans:21022755}  Psychiatric Specialty Exam  Presentation General Appearance: Fairly Groomed  Eye Contact:Good  Speech:Clear and Coherent  Speech Volume:Increased  Handedness:Right   Mood and Affect  Mood:Irritable  Affect:Congruent   Thought Process  Thought Processes:Coherent  Descriptions of Associations:Intact  Orientation:Full (Time, Place and Person)  Thought Content:WDL  Diagnosis of Schizophrenia or Schizoaffective disorder in past: No   Hallucinations:Hallucinations: None  Ideas of Reference:None  Suicidal Thoughts:Suicidal Thoughts: Yes, Active SI Active Intent and/or Plan: -- (patient will not indicate if she has suicidal plan/intent)  Homicidal Thoughts:Homicidal Thoughts: No   Sensorium  Memory:-- (uncooperative)  Judgment:Fair  Insight:Good   Executive Functions  Concentration:Good  Attention Span:Good  [place] of Knowledge:Good  Language:Good   Psychomotor Activity  Psychomotor Activity:Psychomotor Activity: Normal   Assets  Assets:Desire for Improvement   Sleep  Sleep:Sleep: Fair   Nutritional Assessment (For OBS and FBC admissions only) Has the patient had a weight loss or gain of 10 pounds or more in the last 3 months?: -- (refused to answer) Has the patient had a decrease in food intake/or appetite?: -- (refused to answer) Does the patient have dental problems?: -- (refused to answer) Does the patient have eating habits or behaviors that may be indicators of an eating disorder including binging or inducing vomiting?: -- (refused  to answer) Has the patient recently lost weight without trying?: -- (refused to answer) Has the patient been eating poorly because of a  decreased appetite?: -- (refused to answer)    Physical Exam ROS  Blood pressure (!) 144/74, pulse 89, temperature 98.3 F (36.8 C), temperature source Tympanic, resp. rate 18, SpO2 99 %, unknown if currently breastfeeding. There is no height or weight on file to calculate BMI.  Past Psychiatric History: ***   Is the patient at risk to self? {YES/NO:21197} Has the patient been a risk to self in the past 6 months? {YES/NO:21197}.    Has the patient been a risk to self within the distant past? {YES/NO:21197}  Is the patient a risk to others? {YES/NO:21197}  Has the patient been a risk to others in the past 6 months? {YES/NO:21197}  Has the patient been a risk to others within the distant past? {YES/NO:21197}  Past Medical History:  Past Medical History:  Diagnosis Date  . Anemia   . Anxiety   . Arthritis   . Blood transfusion without reported diagnosis   . Crohn's disease ([place])   . Depression   . Drug-seeking behavior   . Gestational diabetes   . History of substance abuse ([place])   . HSV infection   . Pregnancy induced hypertension   . Substance abuse [facility])     Past Surgical History:  Procedure Laterality Date  . CHOLECYSTECTOMY    . COLONOSCOPY N/A 07/07/2017   Procedure: COLONOSCOPY;  Surgeon: Juanita Craver, MD;  Location: [facility] ENDOSCOPY;  Service: Endoscopy;  Laterality: N/A;  . DEEP NECK LYMPH NODE BIOPSY / EXCISION  2011    Family History:  Family History  Problem Relation Age of Onset  . Diabetes Mother   . Hypertension Mother   . Hypertension Father   . Stroke Maternal Grandfather   . Colon cancer Neg Hx   . Stomach cancer Neg Hx     Social History:  Social History   Socioeconomic History  . Marital status: Single    Spouse name: Not on file  . Number of children: 2  . Years of education: Not on file  . Highest education level: Not on file  Occupational History  . Not on file  Tobacco Use  . Smoking status: Never Smoker  . Smokeless tobacco: Never  Used  Vaping Use  . Vaping Use: Never used  Substance and Sexual Activity  . Alcohol use: Not Currently  . Drug use: Yes    Types: "Crack" cocaine, Cocaine    Comment: cocai, former user, 06/27/2017  . Sexual activity: Not Currently    Partners: Male    Birth control/protection: None  Other Topics Concern  . Not on file  Social History Narrative  . Not on file   Social Determinants of Health   Financial Resource Strain: Not on file  Food Insecurity: Not on file  Transportation Needs: Not on file  Physical Activity: Not on file  Stress: Not on file  Social Connections: Not on file  Intimate Partner Violence: Not on file    SDOH:  SDOH Screenings   Alcohol Screen: Low Risk   . Last Alcohol Screening Score (AUDIT): 0  Depression (PHQ2-9): Not on file  Financial Resource Strain: Not on file  Food Insecurity: Not on file  Housing: Not on file  Physical Activity: Not on file  Social Connections: Not on file  Stress: Not on file  Tobacco Use: Low Risk   . Smoking Tobacco Use:  Never Smoker  . Smokeless Tobacco Use: Never Used  Transportation Needs: Not on file    Last Labs:  Admission on 01/08/2021, Discharged on 01/08/2021  Component Date Value Ref Range Status  . Sodium 01/08/2021 138  135 - 145 mmol/L Final  . Potassium 01/08/2021 3.4* 3.5 - 5.1 mmol/L Final  . Chloride 01/08/2021 107  98 - 111 mmol/L Final  . CO2 01/08/2021 22  22 - 32 mmol/L Final  . Glucose, Bld 01/08/2021 103* 70 - 99 mg/dL Final   Glucose reference range applies only to samples taken after fasting for at least 8 hours.  . BUN 01/08/2021 9  6 - 20 mg/dL Final  . Creatinine, Ser 01/08/2021 0.69  0.44 - 1.00 mg/dL Final  . Calcium 01/08/2021 9.3  8.9 - 10.3 mg/dL Final  . Total Protein 01/08/2021 7.4  6.5 - 8.1 g/dL Final  . Albumin 01/08/2021 4.1  3.5 - 5.0 g/dL Final  . AST 01/08/2021 15  15 - 41 U/L Final  . ALT 01/08/2021 14  0 - 44 U/L Final  . Alkaline Phosphatase 01/08/2021 51  38 - 126  U/L Final  . Total Bilirubin 01/08/2021 0.3  0.3 - 1.2 mg/dL Final  . GFR, Estimated 01/08/2021 >60  >60 mL/min Final   Comment: (NOTE) Calculated using the CKD-EPI Creatinine Equation (2021)   . Anion gap 01/08/2021 9  5 - 15 Final   Performed at [facility], [place] [address] [place], [place] [address]  . Alcohol, Ethyl (B) 01/08/2021 <10  <10 mg/dL Final   Comment: (NOTE) Lowest detectable limit for serum alcohol is 10 mg/dL.  For medical purposes only. Performed at [facility], [place] [address], [place], [place] [address]   . Salicylate Lvl 42/59/5638 <7.0* 7.0 - 30.0 mg/dL Final   Performed at [place] [address], [place], [place] [address]  . Acetaminophen (Tylenol), Serum 01/08/2021 <10* 10 - 30 ug/mL Final   Comment: (NOTE) Therapeutic concentrations vary significantly. A range of 10-30 ug/mL  may be an effective concentration for many patients. However, some  are best treated at concentrations outside of this range. Acetaminophen concentrations >150 ug/mL at 4 hours after ingestion  and >50 ug/mL at 12 hours after ingestion are often associated with  toxic reactions.  Performed at [facility], [place] [address], [place], [place] [address]   . WBC 01/08/2021 6.7  4.0 - 10.5 K/uL Final  . RBC 01/08/2021 4.32  3.87 - 5.11 MIL/uL Final  . Hemoglobin 01/08/2021 11.2* 12.0 - 15.0 g/dL Final  . HCT 01/08/2021 35.8* 36.0 - 46.0 % Final  . MCV 01/08/2021 82.9  80.0 - 100.0 fL Final  . MCH 01/08/2021 25.9* 26.0 - 34.0 pg Final  . MCHC 01/08/2021 31.3  30.0 - 36.0 g/dL Final  . RDW 01/08/2021 13.4  11.5 - 15.5 % Final  . Platelets 01/08/2021 374  150 - 400 K/uL Final  . nRBC 01/08/2021 0.0  0.0 - 0.2 % Final   Performed at [facility], [place] [address], [place], [place] [address]  . I-stat hCG, quantitative 01/08/2021 <5.0  <5 mIU/mL Final  . Comment 3 01/08/2021          Final    Comment:   GEST. AGE      CONC.  (mIU/mL)   <=1 WEEK        5 - 50     2 WEEKS  50 - 500     3 WEEKS       100 - 10,000     4 WEEKS     1,000 - 30,000        FEMALE AND NON-PREGNANT FEMALE:     LESS THAN 5 mIU/mL   . SARS Coronavirus 2 by RT PCR 01/08/2021 NEGATIVE  NEGATIVE Final   Comment: (NOTE) SARS-CoV-2 target nucleic acids are NOT DETECTED.  The SARS-CoV-2 RNA is generally detectable in upper respiratory specimens during the acute phase of infection. The lowest concentration of SARS-CoV-2 viral copies this assay can detect is 138 copies/mL. A negative result does not preclude SARS-Cov-2 infection and should not be used as the sole basis for treatment or other patient management decisions. A negative result may occur with  improper specimen collection/handling, submission of specimen other than nasopharyngeal swab, presence of viral mutation(s) within the areas targeted by this assay, and inadequate number of viral copies(<138 copies/mL). A negative result must be combined with clinical observations, patient history, and epidemiological information. The expected result is Negative.  Fact Sheet for Patients:  EntrepreneurPulse.com.au  Fact Sheet for Healthcare Providers:  IncredibleEmployment.be  This test is no                          t yet approved or cleared by the [place] FDA and  has been authorized for detection and/or diagnosis of SARS-CoV-2 by FDA under an Emergency Use Authorization (EUA). This EUA will remain  in effect (meaning this test can be used) for the duration of the COVID-19 declaration under Section 564(b)(1) of the Act, 21 U.S.C.section 360bbb-3(b)(1), unless the authorization is terminated  or revoked sooner.      . Influenza A by PCR 01/08/2021 NEGATIVE  NEGATIVE Final  . Influenza B by PCR 01/08/2021 NEGATIVE  NEGATIVE Final   Comment: (NOTE) The Xpert Xpress SARS-CoV-2/FLU/RSV plus assay is  intended as an aid in the diagnosis of influenza from Nasopharyngeal swab specimens and should not be used as a sole basis for treatment. Nasal washings and aspirates are unacceptable for Xpert Xpress SARS-CoV-2/FLU/RSV testing.  Fact Sheet for Patients: EntrepreneurPulse.com.au  Fact Sheet for Healthcare Providers: IncredibleEmployment.be  This test is not yet approved or cleared by the [place] FDA and has been authorized for detection and/or diagnosis of SARS-CoV-2 by FDA under an Emergency Use Authorization (EUA). This EUA will remain in effect (meaning this test can be used) for the duration of the COVID-19 declaration under Section 564(b)(1) of the Act, 21 U.S.C. section 360bbb-3(b)(1), unless the authorization is terminated or revoked.  Performed at [facility], [place] [address] [place], [place] [address]   Admission on 11/12/2020, Discharged on 11/15/2020  Component Date Value Ref Range Status  . Sodium 11/12/2020 138  135 - 145 mmol/L Final  . Potassium 11/12/2020 3.4* 3.5 - 5.1 mmol/L Final  . Chloride 11/12/2020 105  98 - 111 mmol/L Final  . CO2 11/12/2020 24  22 - 32 mmol/L Final  . Glucose, Bld 11/12/2020 115* 70 - 99 mg/dL Final   Glucose reference range applies only to samples taken after fasting for at least 8 hours.  . BUN 11/12/2020 6  6 - 20 mg/dL Final  . Creatinine, Ser 11/12/2020 0.74  0.44 - 1.00 mg/dL Final  . Calcium 11/12/2020 9.0  8.9 - 10.3 mg/dL Final  . Total Protein 11/12/2020 7.5  6.5 - 8.1 g/dL Final  . Albumin 11/12/2020  4.1  3.5 - 5.0 g/dL Final  . AST 11/12/2020 20  15 - 41 U/L Final  . ALT 11/12/2020 19  0 - 44 U/L Final  . Alkaline Phosphatase 11/12/2020 57  38 - 126 U/L Final  . Total Bilirubin 11/12/2020 0.7  0.3 - 1.2 mg/dL Final  . GFR, Estimated 11/12/2020 >60  >60 mL/min Final   Comment: (NOTE) Calculated using the CKD-EPI Creatinine Equation (2021)   . Anion gap  11/12/2020 9  5 - 15 Final   Performed at [facility], [place] [address] [place], [place] [address]  . Alcohol, Ethyl (B) 11/12/2020 <10  <10 mg/dL Final   Comment: (NOTE) Lowest detectable limit for serum alcohol is 10 mg/dL.  For medical purposes only. Performed at [facility], [place] [address], [place], [place] [address]   . Salicylate Lvl 60/63/0160 <7.0* 7.0 - 30.0 mg/dL Final   Performed at [facility] [address] [place], [place] [address]  . Acetaminophen (Tylenol), Serum 11/12/2020 <10* 10 - 30 ug/mL Final   Comment: (NOTE) Therapeutic concentrations vary significantly. A range of 10-30 ug/mL  may be an effective concentration for many patients. However, some  are best treated at concentrations outside of this range. Acetaminophen concentrations >150 ug/mL at 4 hours after ingestion  and >50 ug/mL at 12 hours after ingestion are often associated with  toxic reactions.  Performed at [facility], [place] [address], [place], [place] [address]   . WBC 11/12/2020 6.1  4.0 - 10.5 K/uL Final  . RBC 11/12/2020 4.00  3.87 - 5.11 MIL/uL Final  . Hemoglobin 11/12/2020 10.3* 12.0 - 15.0 g/dL Final  . HCT 11/12/2020 33.9* 36.0 - 46.0 % Final  . MCV 11/12/2020 84.8  80.0 - 100.0 fL Final  . MCH 11/12/2020 25.8* 26.0 - 34.0 pg Final  . MCHC 11/12/2020 30.4  30.0 - 36.0 g/dL Final  . RDW 11/12/2020 14.5  11.5 - 15.5 % Final  . Platelets 11/12/2020 379  150 - 400 K/uL Final  . nRBC 11/12/2020 0.0  0.0 - 0.2 % Final   Performed at [facility] [address], [place],  [address]  . I-stat hCG, quantitative 11/12/2020 <5.0  <5 mIU/mL Final  . Comment 3 11/12/2020          Final   Comment:   GEST. AGE      CONC.  (mIU/mL)   <=1 WEEK        5 - 50     2 WEEKS       50 - 500     3 WEEKS       100 - 10,000     4 WEEKS     1,000 - 30,000        FEMALE AND NON-PREGNANT FEMALE:     LESS THAN 5 mIU/mL   . SARS Coronavirus 2 by RT PCR 11/12/2020  POSITIVE* NEGATIVE Final   Comment: RESULT CALLED TO, READ BACK BY AND VERIFIED WITH: K,ISLEY @1906  11/12/20 EB (NOTE) SARS-CoV-2 target nucleic acids are DETECTED.  The SARS-CoV-2 RNA is generally detectable in upper respiratory specimens during the acute phase of infection. Positive results are indicative of the presence of the identified virus, but do not rule out bacterial infection or co-infection with other pathogens not detected by the test. Clinical correlation with patient history and other diagnostic information is necessary to determine patient infection status. The expected result is Negative.  Fact Sheet for Patients: EntrepreneurPulse.com.au  Fact Sheet for Healthcare Providers: IncredibleEmployment.be  This test is not yet approved or cleared by the [place] FDA and  has been authorized for detection and/or diagnosis of SARS-CoV-2 by FDA under an Emergency Use Authorization (EUA).  This EUA will remain in effect (meaning this test can be used) fo                          r the duration of  the COVID-19 declaration under Section 564(b)(1) of the Act, 21 U.S.C. section 360bbb-3(b)(1), unless the authorization is terminated or revoked sooner.    . Influenza A by PCR 11/12/2020 NEGATIVE  NEGATIVE Final  . Influenza B by PCR 11/12/2020 NEGATIVE  NEGATIVE Final   Comment: (NOTE) The Xpert Xpress SARS-CoV-2/FLU/RSV plus assay is intended as an aid in the diagnosis of influenza from Nasopharyngeal swab specimens and should not be used as a sole basis for treatment. Nasal washings and aspirates are unacceptable for Xpert Xpress SARS-CoV-2/FLU/RSV testing.  Fact Sheet for Patients: EntrepreneurPulse.com.au  Fact Sheet for Healthcare Providers: IncredibleEmployment.be  This test is not yet approved or cleared by the [place] FDA and has been authorized for detection and/or diagnosis of  SARS-CoV-2 by FDA under an Emergency Use Authorization (EUA). This EUA will remain in effect (meaning this test can be used) for the duration of the COVID-19 declaration under Section 564(b)(1) of the Act, 21 U.S.C. section 360bbb-3(b)(1), unless the authorization is terminated or revoked.  Performed at [facility], [place] [address] [place], [place] [address]   Admission on 10/22/2020, Discharged on 10/25/2020  Component Date Value Ref Range Status  . SARS Coronavirus 2 10/22/2020 NEGATIVE  NEGATIVE Final   Comment: (NOTE) SARS-CoV-2 target nucleic acids are NOT DETECTED.  The SARS-CoV-2 RNA is generally detectable in upper and lower respiratory specimens during the acute phase of infection. The lowest concentration of SARS-CoV-2 viral copies this assay can detect is 250 copies / mL. A negative result does not preclude SARS-CoV-2 infection and should not be used as the sole basis for treatment or other patient management decisions.  A negative result may occur with improper specimen collection / handling, submission of specimen other than nasopharyngeal swab, presence of viral mutation(s) within the areas targeted by this assay, and inadequate number of viral copies (<250 copies / mL). A negative result must be combined with clinical observations, patient history, and epidemiological information.  Fact Sheet for Patients:   StrictlyIdeas.no  Fact Sheet for Healthcare Providers: BankingDealers.co.za  This test is not yet approved or                           cleared by the [place] FDA and has been authorized for detection and/or diagnosis of SARS-CoV-2 by FDA under an Emergency Use Authorization (EUA).  This EUA will remain in effect (meaning this test can be used) for the duration of the COVID-19 declaration under Section 564(b)(1) of the Act, 21 U.S.C. section 360bbb-3(b)(1), unless the authorization is terminated  or revoked sooner.  Performed at [facility], [place] [address], [place], [place] [address]     Allergies: Patient has no known allergies.  PTA Medications: (Not in a hospital admission)   Medical Decision Making  ***    Recommendations  [facility] MSE Recommendations:[id number]}  Ophelia Shoulder, NP 01/08/21  11:09 PM

## 2021-01-08 NOTE — ED Notes (Signed)
Patient provided with dinner tray at this time.
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
## 2021-01-08 NOTE — ED Provider Notes (Signed)
Behavioral Health Admission H&P [facility] & OBS)  Date: 01/09/21 Patient Name: Latoya [name] MRN: [id number] Chief Complaint:  Chief Complaint  Patient presents with  . Suicidal      Diagnoses:  Final diagnoses:  Episode of recurrent major depressive disorder, unspecified depression episode severity ([place])  Suicidal ideation    HPI: Latoya [name] is a 30 y/o female. Per chart review, patient  presented voluntarily to [facility] with complaint of depression and suicidal ideation with plans to overdose. Patient was transferred to [facility] for overnight observation for safety with reassessment by psychiatry tomorrow 01/09/21.   Patient was assessed face to face upon arrival to [facility]. Patient is alert and orient X 4, patient is agitated, uncooperative , and refusing to answer assessment questions. Patient states "I'm suicidal and I have drug problems. That's all I'm going to say to you." Patient refused to answer other assessment questions and becomes easily agitated when this writer as well as other nursing staffs attempted to redirect her. Patient states "I talked to 10 people today about the same thing, I'm not going to repeat myself again"  Patient does not appear to be in any acute distress, no signs of SOB, no complaint of chest pain, headache, dizziness, visual problems, gi/gu issues, vital signs within normal limit with exception to BP of 144/74.   PHQ 2-9:  Flowsheet Row Routine Prenatal from 04/19/2017 in [place] for [facility]  Thoughts that you would be better off dead, or of hurting yourself in some way Not at all  PHQ-9 Total Score 8      [place] ED from 01/08/2021 in [facility] Most recent reading at 01/08/2021 11:07 PM ED from 01/08/2021 in [place] DEPT Most recent reading at 01/08/2021  4:26 PM ED from 01/08/2021 in [place] DEPT Most recent reading at 01/08/2021   1:35 AM  C-SSRS RISK CATEGORY High Risk High Risk No Risk       Total Time spent with patient: 15 minutes  Musculoskeletal  Strength & Muscle Tone: within normal limits Gait & Station: normal Patient leans: Right  Psychiatric Specialty Exam  Presentation General Appearance: Fairly Groomed  Eye Contact:Good  Speech:Clear and Coherent  Speech Volume:Increased  Handedness:Right   Mood and Affect  Mood:Irritable  Affect:Congruent   Thought Process  Thought Processes:Coherent  Descriptions of Associations:Intact  Orientation:Full (Time, Place and Person)  Thought Content:WDL  Diagnosis of Schizophrenia or Schizoaffective disorder in past: No   Hallucinations:Hallucinations: None  Ideas of Reference:None  Suicidal Thoughts:Suicidal Thoughts: Yes, Active SI Active Intent and/or Plan: -- (patient will not indicate if she has suicidal plan/intent)  Homicidal Thoughts:Homicidal Thoughts: No   Sensorium  Memory:-- (uncooperative)  Judgment:Fair  Insight:Good   Executive Functions  Concentration:Good  Attention Span:Good  [place] of Knowledge:Good  Language:Good   Psychomotor Activity  Psychomotor Activity:Psychomotor Activity: Normal   Assets  Assets:Desire for Improvement   Sleep  Sleep:Sleep: Fair   Nutritional Assessment (For OBS and [facility]BC admissions only) Has the patient had a weight loss or gain of 10 pounds or more in the last 3 months?: -- (refused to answer) Has the patient had a decrease in food intake/or appetite?: -- (refused to answer) Does the patient have dental problems?: -- (refused to answer) Does the patient have eating habits or behaviors that may be indicators of an eating disorder including binging or inducing vomiting?: -- (refused to answer) Has the patient recently lost weight without  trying?: -- (refused to answer) Has the patient been eating poorly because of a decreased appetite?: -- (refused to  answer)    Physical Exam Vitals and nursing note reviewed.  Constitutional:      General: She is not in acute distress.    Appearance: She is well-developed.  HENT:     Head: Normocephalic and atraumatic.  Eyes:     Conjunctiva/sclera: Conjunctivae normal.  Cardiovascular:     Rate and Rhythm: Normal rate.     Heart sounds: No murmur heard.   Pulmonary:     Effort: Pulmonary effort is normal. No respiratory distress.     Breath sounds: Normal breath sounds.  Abdominal:     Palpations: Abdomen is soft.     Tenderness: There is no abdominal tenderness.  Musculoskeletal:     Cervical back: Neck supple.  Skin:    General: Skin is warm and dry.  Neurological:     Mental Status: She is alert and oriented to person, place, and time.  Psychiatric:        Attention and Perception: She does not perceive auditory or visual hallucinations.        Mood and Affect: Affect is angry.        Behavior: Behavior is agitated.        Thought Content: Thought content is not paranoid. Thought content includes suicidal ideation. Thought content does not include homicidal ideation.    Review of Systems  Constitutional: Negative.   HENT: Negative.   Eyes: Negative.   Respiratory: Negative.   Cardiovascular: Negative.   Gastrointestinal: Negative.   Genitourinary: Negative.   Musculoskeletal: Negative.   Skin: Negative.   Neurological: Negative.   Endo/Heme/Allergies: Negative.   Psychiatric/Behavioral: Positive for depression, substance abuse and suicidal ideas. Negative for hallucinations.    Blood pressure (!) 144/74, pulse 89, temperature 98.3 F (36.8 C), temperature source Tympanic, resp. rate 18, SpO2 99 %, unknown if currently breastfeeding. There is no height or weight on file to calculate BMI.  Past Psychiatric History: polysubstance abuse, depression, suicidal ideation   Is the patient at risk to self? Yes  Has the patient been a risk to self in the past 6 months? Yes .     Has the patient been a risk to self within the distant past? Yes   Is the patient a risk to others? No   Has the patient been a risk to others in the past 6 months? No   Has the patient been a risk to others within the distant past? No   Past Medical History:  Past Medical History:  Diagnosis Date  . Anemia   . Anxiety   . Arthritis   . Blood transfusion without reported diagnosis   . Crohn's disease ([place])   . Depression   . Drug-seeking behavior   . Gestational diabetes   . History of substance abuse ([place])   . HSV infection   . Pregnancy induced hypertension   . Substance abuse [facility])     Past Surgical History:  Procedure Laterality Date  . CHOLECYSTECTOMY    . COLONOSCOPY N/A 07/07/2017   Procedure: COLONOSCOPY;  Surgeon: Juanita Craver, MD;  Location: [facility] ENDOSCOPY;  Service: Endoscopy;  Laterality: N/A;  . DEEP NECK LYMPH NODE BIOPSY / EXCISION  2011    Family History:  Family History  Problem Relation Age of Onset  . Diabetes Mother   . Hypertension Mother   . Hypertension Father   . Stroke Maternal Grandfather   .  Colon cancer Neg Hx   . Stomach cancer Neg Hx     Social History:  Social History   Socioeconomic History  . Marital status: Single    Spouse name: Not on file  . Number of children: 2  . Years of education: Not on file  . Highest education level: Not on file  Occupational History  . Not on file  Tobacco Use  . Smoking status: Never Smoker  . Smokeless tobacco: Never Used  Vaping Use  . Vaping Use: Never used  Substance and Sexual Activity  . Alcohol use: Not Currently  . Drug use: Yes    Types: "Crack" cocaine, Cocaine    Comment: cocai, former user, 06/27/2017  . Sexual activity: Not Currently    Partners: Male    Birth control/protection: None  Other Topics Concern  . Not on file  Social History Narrative  . Not on file   Social Determinants of Health   Financial Resource Strain: Not on file  Food Insecurity: Not on file   Transportation Needs: Not on file  Physical Activity: Not on file  Stress: Not on file  Social Connections: Not on file  Intimate Partner Violence: Not on file    SDOH:  SDOH Screenings   Alcohol Screen: Low Risk   . Last Alcohol Screening Score (AUDIT): 0  Depression (PHQ2-9): Not on file  Financial Resource Strain: Not on file  Food Insecurity: Not on file  Housing: Not on file  Physical Activity: Not on file  Social Connections: Not on file  Stress: Not on file  Tobacco Use: Low Risk   . Smoking Tobacco Use: Never Smoker  . Smokeless Tobacco Use: Never Used  Transportation Needs: Not on file    Last Labs:  Admission on 01/08/2021, Discharged on 01/08/2021  Component Date Value Ref Range Status  . Sodium 01/08/2021 138  135 - 145 mmol/L Final  . Potassium 01/08/2021 3.4* 3.5 - 5.1 mmol/L Final  . Chloride 01/08/2021 107  98 - 111 mmol/L Final  . CO2 01/08/2021 22  22 - 32 mmol/L Final  . Glucose, Bld 01/08/2021 103* 70 - 99 mg/dL Final   Glucose reference range applies only to samples taken after fasting for at least 8 hours.  . BUN 01/08/2021 9  6 - 20 mg/dL Final  . Creatinine, Ser 01/08/2021 0.69  0.44 - 1.00 mg/dL Final  . Calcium 01/08/2021 9.3  8.9 - 10.3 mg/dL Final  . Total Protein 01/08/2021 7.4  6.5 - 8.1 g/dL Final  . Albumin 01/08/2021 4.1  3.5 - 5.0 g/dL Final  . AST 01/08/2021 15  15 - 41 U/L Final  . ALT 01/08/2021 14  0 - 44 U/L Final  . Alkaline Phosphatase 01/08/2021 51  38 - 126 U/L Final  . Total Bilirubin 01/08/2021 0.3  0.3 - 1.2 mg/dL Final  . GFR, Estimated 01/08/2021 >60  >60 mL/min Final   Comment: (NOTE) Calculated using the CKD-EPI Creatinine Equation (2021)   . Anion gap 01/08/2021 9  5 - 15 Final   Performed at [facility], [place] [address] [place], [place] [address]  . Alcohol, Ethyl (B) 01/08/2021 <10  <10 mg/dL Final   Comment: (NOTE) Lowest detectable limit for serum alcohol is 10 mg/dL.  For medical  purposes only. Performed at [facility], [place] [address] [place], [place] [address]   . Salicylate Lvl 22/10/5425 <7.0* 7.0 - 30.0 mg/dL Final   Performed at [facility], [place] [address] [place], [place] [address]  . Acetaminophen (Tylenol), Serum 01/08/2021 <10* 10 - 30 ug/mL Final   Comment: (NOTE) Therapeutic concentrations vary significantly. A range of 10-30 ug/mL  may be an effective concentration for many patients. However, some  are best treated at concentrations outside of this range. Acetaminophen concentrations >150 ug/mL at 4 hours after ingestion  and >50 ug/mL at 12 hours after ingestion are often associated with  toxic reactions.  Performed at [facility], [place] [address] [place], [place] [address]   . WBC 01/08/2021 6.7  4.0 - 10.5 K/uL Final  . RBC 01/08/2021 4.32  3.87 - 5.11 MIL/uL Final  . Hemoglobin 01/08/2021 11.2* 12.0 - 15.0 g/dL Final  . HCT 01/08/2021 35.8* 36.0 - 46.0 % Final  . MCV 01/08/2021 82.9  80.0 - 100.0 fL Final  . MCH 01/08/2021 25.9* 26.0 - 34.0 pg Final  . MCHC 01/08/2021 31.3  30.0 - 36.0 g/dL Final  . RDW 01/08/2021 13.4  11.5 - 15.5 % Final  . Platelets 01/08/2021 374  150 - 400 K/uL Final  . nRBC 01/08/2021 0.0  0.0 - 0.2 % Final   Performed at [facility], [place] [address] [place],  [address]  . I-stat hCG, quantitative 01/08/2021 <5.0  <5 mIU/mL Final  . Comment 3 01/08/2021          Final   Comment:   GEST. AGE      CONC.  (mIU/mL)   <=1 WEEK        5 - 50     2 WEEKS       50 - 500     3 WEEKS       100 - 10,000     4 WEEKS     1,000 - 30,000        FEMALE AND NON-PREGNANT FEMALE:     LESS THAN 5 mIU/mL   . SARS Coronavirus 2 by RT PCR 01/08/2021 NEGATIVE  NEGATIVE Final   Comment: (NOTE) SARS-CoV-2 target nucleic acids are NOT DETECTED.  The SARS-CoV-2 RNA is generally detectable in upper respiratory specimens during the acute phase of  infection. The lowest concentration of SARS-CoV-2 viral copies this assay can detect is 138 copies/mL. A negative result does not preclude SARS-Cov-2 infection and should not be used as the sole basis for treatment or other patient management decisions. A negative result may occur with  improper specimen collection/handling, submission of specimen other than nasopharyngeal swab, presence of viral mutation(s) within the areas targeted by this assay, and inadequate number of viral copies(<138 copies/mL). A negative result must be combined with clinical observations, patient history, and epidemiological information. The expected result is Negative.  Fact Sheet for Patients:  EntrepreneurPulse.com.au  Fact Sheet for Healthcare Providers:  IncredibleEmployment.be  This test is no                          t yet approved or cleared by the [place] FDA and  has been authorized for detection and/or diagnosis of SARS-CoV-2 by FDA under an Emergency Use Authorization (EUA). This EUA will remain  in effect (meaning this test can be used) for the duration of the COVID-19 declaration under Section 564(b)(1) of the Act, 21 U.S.C.section 360bbb-3(b)(1), unless the authorization is terminated  or revoked sooner.      . Influenza A by PCR 01/08/2021 NEGATIVE  NEGATIVE Final  . Influenza B by  PCR 01/08/2021 NEGATIVE  NEGATIVE Final   Comment: (NOTE) The Xpert Xpress SARS-CoV-2/FLU/RSV plus assay is intended as an aid in the diagnosis of influenza from Nasopharyngeal swab specimens and should not be used as a sole basis for treatment. Nasal washings and aspirates are unacceptable for Xpert Xpress SARS-CoV-2/FLU/RSV testing.  Fact Sheet for Patients: EntrepreneurPulse.com.au  Fact Sheet for Healthcare Providers: IncredibleEmployment.be  This test is not yet approved or cleared by the [place] FDA and has been  authorized for detection and/or diagnosis of SARS-CoV-2 by FDA under an Emergency Use Authorization (EUA). This EUA will remain in effect (meaning this test can be used) for the duration of the COVID-19 declaration under Section 564(b)(1) of the Act, 21 U.S.C. section 360bbb-3(b)(1), unless the authorization is terminated or revoked.  Performed at [facility], [place] [address] [place], [place] [address]   Admission on 11/12/2020, Discharged on 11/15/2020  Component Date Value Ref Range Status  . Sodium 11/12/2020 138  135 - 145 mmol/L Final  . Potassium 11/12/2020 3.4* 3.5 - 5.1 mmol/L Final  . Chloride 11/12/2020 105  98 - 111 mmol/L Final  . CO2 11/12/2020 24  22 - 32 mmol/L Final  . Glucose, Bld 11/12/2020 115* 70 - 99 mg/dL Final   Glucose reference range applies only to samples taken after fasting for at least 8 hours.  . BUN 11/12/2020 6  6 - 20 mg/dL Final  . Creatinine, Ser 11/12/2020 0.74  0.44 - 1.00 mg/dL Final  . Calcium 11/12/2020 9.0  8.9 - 10.3 mg/dL Final  . Total Protein 11/12/2020 7.5  6.5 - 8.1 g/dL Final  . Albumin 11/12/2020 4.1  3.5 - 5.0 g/dL Final  . AST 11/12/2020 20  15 - 41 U/L Final  . ALT 11/12/2020 19  0 - 44 U/L Final  . Alkaline Phosphatase 11/12/2020 57  38 - 126 U/L Final  . Total Bilirubin 11/12/2020 0.7  0.3 - 1.2 mg/dL Final  . GFR, Estimated 11/12/2020 >60  >60 mL/min Final   Comment: (NOTE) Calculated using the CKD-EPI Creatinine Equation (2021)   . Anion gap 11/12/2020 9  5 - 15 Final   Performed at [facility], [place] [address], [place], [place] [address]  . Alcohol, Ethyl (B) 11/12/2020 <10  <10 mg/dL Final   Comment: (NOTE) Lowest detectable limit for serum alcohol is 10 mg/dL.  For medical purposes only. Performed at [facility], [place] [address], [place], [place] [address]   . Salicylate Lvl 79/39/0300 <7.0* 7.0 - 30.0 mg/dL Final   Performed at [place] [address] [place],  [place]  [address]  . Acetaminophen (Tylenol), Serum 11/12/2020 <10* 10 - 30 ug/mL Final   Comment: (NOTE) Therapeutic concentrations vary significantly. A range of 10-30 ug/mL  may be an effective concentration for many patients. However, some  are best treated at concentrations outside of this range. Acetaminophen concentrations >150 ug/mL at 4 hours after ingestion  and >50 ug/mL at 12 hours after ingestion are often associated with  toxic reactions.  Performed at [facility], [place] [address] [place], [place] [address]   . WBC 11/12/2020 6.1  4.0 - 10.5 K/uL Final  . RBC 11/12/2020 4.00  3.87 - 5.11 MIL/uL Final  . Hemoglobin 11/12/2020 10.3* 12.0 - 15.0 g/dL Final  . HCT 11/12/2020 33.9* 36.0 - 46.0 % Final  . MCV 11/12/2020 84.8  80.0 - 100.0 fL Final  . MCH 11/12/2020 25.8* 26.0 -  34.0 pg Final  . MCHC 11/12/2020 30.4  30.0 - 36.0 g/dL Final  . RDW 11/12/2020 14.5  11.5 - 15.5 % Final  . Platelets 11/12/2020 379  150 - 400 K/uL Final  . nRBC 11/12/2020 0.0  0.0 - 0.2 % Final   Performed at [facility], [place] [address] [place], [place] [address]  . I-stat hCG, quantitative 11/12/2020 <5.0  <5 mIU/mL Final  . Comment 3 11/12/2020          Final   Comment:   GEST. AGE      CONC.  (mIU/mL)   <=1 WEEK        5 - 50     2 WEEKS       50 - 500     3 WEEKS       100 - 10,000     4 WEEKS     1,000 - 30,000        FEMALE AND NON-PREGNANT FEMALE:     LESS THAN 5 mIU/mL   . SARS Coronavirus 2 by RT PCR 11/12/2020 POSITIVE* NEGATIVE Final   Comment: RESULT CALLED TO, READ BACK BY AND VERIFIED WITH: K,ISLEY @1906  11/12/20 EB (NOTE) SARS-CoV-2 target nucleic acids are DETECTED.  The SARS-CoV-2 RNA is generally detectable in upper respiratory specimens during the acute phase of infection. Positive results are indicative of the presence of the identified virus, but do not rule out bacterial infection or co-infection with other pathogens not detected by the test. Clinical correlation  with patient history and other diagnostic information is necessary to determine patient infection status. The expected result is Negative.  Fact Sheet for Patients: EntrepreneurPulse.com.au  Fact Sheet for Healthcare Providers: IncredibleEmployment.be  This test is not yet approved or cleared by the [place] FDA and  has been authorized for detection and/or diagnosis of SARS-CoV-2 by FDA under an Emergency Use Authorization (EUA).  This EUA will remain in effect (meaning this test can be used) fo                          r the duration of  the COVID-19 declaration under Section 564(b)(1) of the Act, 21 U.S.C. section 360bbb-3(b)(1), unless the authorization is terminated or revoked sooner.    . Influenza A by PCR 11/12/2020 NEGATIVE  NEGATIVE Final  . Influenza B by PCR 11/12/2020 NEGATIVE  NEGATIVE Final   Comment: (NOTE) The Xpert Xpress SARS-CoV-2/FLU/RSV plus assay is intended as an aid in the diagnosis of influenza from Nasopharyngeal swab specimens and should not be used as a sole basis for treatment. Nasal washings and aspirates are unacceptable for Xpert Xpress SARS-CoV-2/FLU/RSV testing.  Fact Sheet for Patients: EntrepreneurPulse.com.au  Fact Sheet for Healthcare Providers: IncredibleEmployment.be  This test is not yet approved or cleared by the [place] FDA and has been authorized for detection and/or diagnosis of SARS-CoV-2 by FDA under an Emergency Use Authorization (EUA). This EUA will remain in effect (meaning this test can be used) for the duration of the COVID-19 declaration under Section 564(b)(1) of the Act, 21 U.S.C. section 360bbb-3(b)(1), unless the authorization is terminated or revoked.  Performed at [facility], [place] [address] [place], [place] [address]   Admission on 10/22/2020, Discharged on 10/25/2020  Component Date Value Ref Range Status  . SARS  Coronavirus 2 10/22/2020 NEGATIVE  NEGATIVE Final   Comment: (NOTE) SARS-CoV-2 target nucleic acids are NOT DETECTED.  The SARS-CoV-2 RNA is  generally detectable in upper and lower respiratory specimens during the acute phase of infection. The lowest concentration of SARS-CoV-2 viral copies this assay can detect is 250 copies / mL. A negative result does not preclude SARS-CoV-2 infection and should not be used as the sole basis for treatment or other patient management decisions.  A negative result may occur with improper specimen collection / handling, submission of specimen other than nasopharyngeal swab, presence of viral mutation(s) within the areas targeted by this assay, and inadequate number of viral copies (<250 copies / mL). A negative result must be combined with clinical observations, patient history, and epidemiological information.  Fact Sheet for Patients:   StrictlyIdeas.no  Fact Sheet for Healthcare Providers: BankingDealers.co.za  This test is not yet approved or                           cleared by the [place] FDA and has been authorized for detection and/or diagnosis of SARS-CoV-2 by FDA under an Emergency Use Authorization (EUA).  This EUA will remain in effect (meaning this test can be used) for the duration of the COVID-19 declaration under Section 564(b)(1) of the Act, 21 U.S.C. section 360bbb-3(b)(1), unless the authorization is terminated or revoked sooner.  Performed at [facility], [place] [address], [place], [place] [address]     Allergies: Patient has no known allergies.  PTA Medications: (Not in a hospital admission)   Medical Decision Making  Admit [facility] to [facility] for overnight observation for safety with reassessment by psychiatry tomorrow 01/09/21.  Continue home medication Obtain urine for UDS    Recommendations  Based on my evaluation the patient does not appear to have an  emergency medical condition.  Ophelia Shoulder, NP 01/09/21  12:03 AM

## 2021-01-09 MED ORDER — QUETIAPINE FUMARATE 200 MG PO TABS: 400.0000 mg | ORAL_TABLET | Freq: Every day | ORAL | Status: DC

## 2021-01-09 MED ORDER — MIRTAZAPINE 15 MG PO TABS: 15.0000 mg | ORAL_TABLET | Freq: Every day | ORAL | Status: DC

## 2021-01-09 NOTE — ED Notes (Signed)
Discharge instructions provided and Pt stated understanding. Personal belongings returned from locker number 31. Pt alert, orient and ambulatory. Pt escorted to the front lobby and provided a bus pass per her request. Safety maintained.

## 2021-01-09 NOTE — ED Notes (Signed)
Pt sleeping@this  time. Breathing even and unlabored. Will continue to monitor for safety

## 2021-01-09 NOTE — ED Provider Notes (Signed)
[facility]BC/OBS ASAP Discharge Summary  Date and Time: 01/09/2021 11:37 AM  Name: Latoya [name]  MRN:  [id number]   Discharge Diagnoses:  Final diagnoses:  Episode of recurrent major depressive disorder, unspecified depression episode severity ([place])  Suicidal ideation     Latoya [name] 29 year is well know to this service. She presented slightly irritable and depressed. Reporting " I needed help, I was suicidal." Aerica denied that she has been taken medications at directed.  NP asked patient about outpatient followed. Lian stated she was recently inpatient and didn't follow-up. " I tried to stay clean." patient was short and abrupt with responses.  Discussed follow-up with [facility]. Daniell then requested bus pass for discharge. NP conacted CSW Jarmaral for additional outpatient follow-up, however is was reported that  patient was waiting for cab per nursing staff. Support, encouragement and reassurances was provided.   Per HPI: -Latoya [name] is a 30 y.o. female past medical history of Crohn's disease, polysubstance abuse, depression, anxiety, bipolar 1 disorder, presenting to the ED with complaint of depression and suicidal ideation.  She states she smokes crack cocaine and snorts heroin regularly.  This is caused her to have a chronic depression.  Over the last 2 weeks she has been feeling more down and having thoughts of ending her life by overdose.  She states she can do this anytime without difficulty.  Denies HI.  Denies medical complaints  Total Time spent with patient: 15 minutes  Past Psychiatric History:  Past Medical History:  Past Medical History:  Diagnosis Date  . Anemia   . Anxiety   . Arthritis   . Blood transfusion without reported diagnosis   . Crohn's disease ([place])   . Depression   . Drug-seeking behavior   . Gestational diabetes   . History of substance abuse ([place])   . HSV infection   . Pregnancy induced hypertension   . Substance abuse [facility])     Past  Surgical History:  Procedure Laterality Date  . CHOLECYSTECTOMY    . COLONOSCOPY N/A 07/07/2017   Procedure: COLONOSCOPY;  Surgeon: Juanita Craver, MD;  Location: [facility] ENDOSCOPY;  Service: Endoscopy;  Laterality: N/A;  . DEEP NECK LYMPH NODE BIOPSY / EXCISION  2011   Family History:  Family History  Problem Relation Age of Onset  . Diabetes Mother   . Hypertension Mother   . Hypertension Father   . Stroke Maternal Grandfather   . Colon cancer Neg Hx   . Stomach cancer Neg Hx    Family Psychiatric History:  Social History:  Social History   Substance and Sexual Activity  Alcohol Use Not Currently     Social History   Substance and Sexual Activity  Drug Use Yes  . Types: "Crack" cocaine, Cocaine   Comment: cocai, former user, 06/27/2017    Social History   Socioeconomic History  . Marital status: Single    Spouse name: Not on file  . Number of children: 2  . Years of education: Not on file  . Highest education level: Not on file  Occupational History  . Not on file  Tobacco Use  . Smoking status: Never Smoker  . Smokeless tobacco: Never Used  Vaping Use  . Vaping Use: Never used  Substance and Sexual Activity  . Alcohol use: Not Currently  . Drug use: Yes    Types: "Crack" cocaine, Cocaine    Comment: cocai, former user, 06/27/2017  . Sexual activity: Not Currently    Partners: Male  Birth control/protection: None  Other Topics Concern  . Not on file  Social History Narrative  . Not on file   Social Determinants of Health   Financial Resource Strain: Not on file  Food Insecurity: Not on file  Transportation Needs: Not on file  Physical Activity: Not on file  Stress: Not on file  Social Connections: Not on file   SDOH:  SDOH Screenings   Alcohol Screen: Low Risk   . Last Alcohol Screening Score (AUDIT): 0  Depression (PHQ2-9): Not on file  Financial Resource Strain: Not on file  Food Insecurity: Not on file  Housing: Not on file  Physical  Activity: Not on file  Social Connections: Not on file  Stress: Not on file  Tobacco Use: Low Risk   . Smoking Tobacco Use: Never Smoker  . Smokeless Tobacco Use: Never Used  Transportation Needs: Not on file    Has this patient used any form of tobacco in the last 30 days? (Cigarettes, Smokeless Tobacco, Cigars, and/or Pipes) Prescription not provided because: non tabacco user  Current Medications:  Current Facility-Administered Medications  Medication Dose Route Frequency Provider Last Rate Last Admin  . acetaminophen (TYLENOL) tablet 650 mg  650 mg Oral Q6H PRN Ajibola, Ene A, NP      . alum & mag hydroxide-simeth (MAALOX/MYLANTA) 200-200-20 MG/5ML suspension 30 mL  30 mL Oral Q4H PRN Ajibola, Ene A, NP      . hydrOXYzine (ATARAX/VISTARIL) tablet 25 mg  25 mg Oral TID PRN Ajibola, Ene A, NP      . magnesium hydroxide (MILK OF MAGNESIA) suspension 30 mL  30 mL Oral Daily PRN Ajibola, Ene A, NP      . mirtazapine (REMERON) tablet 15 mg  15 mg Oral QHS Ajibola, Ene A, NP      . QUEtiapine (SEROQUEL) tablet 400 mg  400 mg Oral QHS Ajibola, Ene A, NP      . traZODone (DESYREL) tablet 50 mg  50 mg Oral QHS PRN Ajibola, Ene A, NP       Current Outpatient Medications  Medication Sig Dispense Refill  . erythromycin ophthalmic ointment Place a 1/2 inch ribbon of ointment into the left lower eyelid 2-4 times daily x 5 days (Patient not taking: Reported on 01/09/2021) 3.5 g 0  . hydrOXYzine (ATARAX/VISTARIL) 25 MG tablet Take 1 tablet (25 mg total) by mouth every 6 (six) hours as needed for anxiety. (Patient not taking: No sig reported) 30 tablet 0  . mirtazapine (REMERON) 15 MG tablet Take 1 tablet (15 mg total) by mouth at bedtime. (Patient not taking: No sig reported) 30 tablet 0  . QUEtiapine (SEROQUEL) 400 MG tablet Take 1 tablet (400 mg total) by mouth at bedtime. (Patient not taking: No sig reported) 30 tablet 0    PTA Medications: (Not in a hospital admission)   Musculoskeletal   Strength & Muscle Tone: within normal limits Gait & Station: normal Patient leans: N/A  Psychiatric Specialty Exam  Presentation  General Appearance: Fairly Groomed  Eye Contact:Good  Speech:Clear and Coherent  Speech Volume:Increased  Handedness:Right   Mood and Affect  Mood:Irritable  Affect:Congruent   Thought Process  Thought Processes:Coherent  Descriptions of Associations:Intact  Orientation:Full (Time, Place and Person)  Thought Content:WDL  Diagnosis of Schizophrenia or Schizoaffective disorder in past: No    Hallucinations:Hallucinations: None  Ideas of Reference:None  Suicidal Thoughts:Suicidal Thoughts: Yes, Active SI Active Intent and/or Plan: -- (patient will not indicate if she has suicidal plan/intent)  Homicidal Thoughts:Homicidal Thoughts: No   Sensorium  Memory:-- (uncooperative)  Judgment:Fair  Insight:Good   Executive Functions  Concentration:Good  Attention Span:Good  [place] of Knowledge:Good  Language:Good   Psychomotor Activity  Psychomotor Activity:Psychomotor Activity: Normal   Assets  Assets:Desire for Improvement   Sleep  Sleep:Sleep: Fair   Nutritional Assessment (For OBS and FBC admissions only) Has the patient had a weight loss or gain of 10 pounds or more in the last 3 months?: -- (refused to answer) Has the patient had a decrease in food intake/or appetite?: -- (refused to answer) Does the patient have dental problems?: -- (refused to answer) Does the patient have eating habits or behaviors that may be indicators of an eating disorder including binging or inducing vomiting?: -- (refused to answer) Has the patient recently lost weight without trying?: -- (refused to answer) Has the patient been eating poorly because of a decreased appetite?: -- (refused to answer)    Physical Exam  Physical Exam ROS Blood pressure 118/74, pulse 80, temperature 98.3 F (36.8 C), temperature source  Temporal, resp. rate 16, SpO2 100 %, unknown if currently breastfeeding. There is no height or weight on file to calculate BMI.  Demographic Factors:  Low socioeconomic status  Loss Factors: Financial problems/change in socioeconomic status  Historical Factors: Family history of mental illness or substance abuse  Risk Reduction Factors:   Positive coping skills or problem solving skills  Continued Clinical Symptoms:  Depression:   Comorbid alcohol abuse/dependence  Cognitive Features That Contribute To Risk:  Closed-mindedness    Suicide Risk:  Minimal: No identifiable suicidal ideation.  Patients presenting with no risk factors but with morbid ruminations; may be classified as minimal risk based on the severity of the depressive symptoms  Plan Of Care/Follow-up recommendations:  Activity:  as tolerated Diet:  heart healthy  Disposition: Take all medications as prescribed. Keep all follow-up appointments as scheduled.  Do not consume alcohol or use illegal drugs while on prescription medications. Report any adverse effects from your medications to your primary care provider promptly.  In the event of recurrent symptoms or worsening symptoms, call 911, a crisis hotline, or go to the nearest emergency department for evaluation.   Derrill Center, NP 01/09/2021, 11:37 AM

## 2021-01-09 NOTE — Discharge Instructions (Addendum)
Take all medications as prescribed. Keep all follow-up appointments as scheduled.  Do not consume alcohol or use illegal drugs while on prescription medications. Report any adverse effects from your medications to your primary care provider promptly.  In the event of recurrent symptoms or worsening symptoms, call 911, a crisis hotline, or go to the nearest emergency department for evaluation.

## 2021-05-26 DEATH — deceased

## 2023-01-09 ENCOUNTER — Encounter: Payer: Self-pay | Admitting: Gastroenterology
